# Patient Record
Sex: Female | Born: 1945
Health system: Southern US, Community
[De-identification: ages and names within clinical notes are randomized; demographics above are authoritative.]

## PROBLEM LIST (undated history)

## (undated) DIAGNOSIS — F419 Anxiety disorder, unspecified: Secondary | ICD-10-CM

## (undated) DIAGNOSIS — I639 Cerebral infarction, unspecified: Secondary | ICD-10-CM

## (undated) DIAGNOSIS — F329 Major depressive disorder, single episode, unspecified: Secondary | ICD-10-CM

## (undated) DIAGNOSIS — I10 Essential (primary) hypertension: Secondary | ICD-10-CM

## (undated) DIAGNOSIS — G43909 Migraine, unspecified, not intractable, without status migrainosus: Secondary | ICD-10-CM

## (undated) DIAGNOSIS — M1990 Unspecified osteoarthritis, unspecified site: Secondary | ICD-10-CM

## (undated) DIAGNOSIS — J449 Chronic obstructive pulmonary disease, unspecified: Secondary | ICD-10-CM

## (undated) DIAGNOSIS — F32A Depression, unspecified: Secondary | ICD-10-CM

## (undated) DIAGNOSIS — K219 Gastro-esophageal reflux disease without esophagitis: Secondary | ICD-10-CM

## (undated) HISTORY — PX: OTHER SURGICAL HISTORY: SHX169

## (undated) HISTORY — PX: APPENDECTOMY: SHX54

## (undated) HISTORY — PX: CHOLECYSTECTOMY: SHX55

## (undated) HISTORY — PX: CEREBRAL ANEURYSM REPAIR: SHX164

## (undated) HISTORY — DX: Anxiety disorder, unspecified: F41.9

## (undated) HISTORY — PX: SEPTOPLASTY: SUR1290

## (undated) HISTORY — DX: Depression, unspecified: F32.A

## (undated) HISTORY — DX: Major depressive disorder, single episode, unspecified: F32.9

## (undated) HISTORY — PX: TUBAL LIGATION: SHX77

## (undated) HISTORY — PX: EYE SURGERY: SHX253

## (undated) HISTORY — DX: Migraine, unspecified, not intractable, without status migrainosus: G43.909

## (undated) HISTORY — PX: HEMORROIDECTOMY: SUR656

---

## 1991-11-14 HISTORY — PX: BRAIN SURGERY: SHX531

## 1998-06-17 ENCOUNTER — Ambulatory Visit (HOSPITAL_COMMUNITY): Admission: RE | Admit: 1998-06-17 | Discharge: 1998-06-17 | Payer: Self-pay | Admitting: Unknown Physician Specialty

## 2001-03-06 ENCOUNTER — Emergency Department (HOSPITAL_COMMUNITY): Admission: EM | Admit: 2001-03-06 | Discharge: 2001-03-07 | Payer: Self-pay | Admitting: Emergency Medicine

## 2001-03-07 ENCOUNTER — Encounter: Payer: Self-pay | Admitting: Emergency Medicine

## 2001-04-15 ENCOUNTER — Ambulatory Visit (HOSPITAL_COMMUNITY): Admission: RE | Admit: 2001-04-15 | Discharge: 2001-04-15 | Payer: Self-pay | Admitting: Pulmonary Disease

## 2001-04-22 ENCOUNTER — Ambulatory Visit (HOSPITAL_COMMUNITY): Admission: RE | Admit: 2001-04-22 | Discharge: 2001-04-22 | Payer: Self-pay | Admitting: Pulmonary Disease

## 2001-08-28 ENCOUNTER — Emergency Department (HOSPITAL_COMMUNITY): Admission: EM | Admit: 2001-08-28 | Discharge: 2001-08-29 | Payer: Self-pay | Admitting: *Deleted

## 2001-08-28 ENCOUNTER — Encounter: Payer: Self-pay | Admitting: *Deleted

## 2001-12-31 ENCOUNTER — Emergency Department (HOSPITAL_COMMUNITY): Admission: EM | Admit: 2001-12-31 | Discharge: 2001-12-31 | Payer: Self-pay | Admitting: *Deleted

## 2001-12-31 ENCOUNTER — Encounter: Payer: Self-pay | Admitting: *Deleted

## 2002-01-31 ENCOUNTER — Ambulatory Visit (HOSPITAL_COMMUNITY): Admission: RE | Admit: 2002-01-31 | Discharge: 2002-01-31 | Payer: Self-pay | Admitting: Pulmonary Disease

## 2002-04-11 ENCOUNTER — Observation Stay (HOSPITAL_COMMUNITY): Admission: EM | Admit: 2002-04-11 | Discharge: 2002-04-11 | Payer: Self-pay | Admitting: Internal Medicine

## 2002-04-11 ENCOUNTER — Encounter: Payer: Self-pay | Admitting: Internal Medicine

## 2002-04-24 ENCOUNTER — Ambulatory Visit (HOSPITAL_COMMUNITY): Admission: RE | Admit: 2002-04-24 | Discharge: 2002-04-24 | Payer: Self-pay | Admitting: Pulmonary Disease

## 2002-11-10 ENCOUNTER — Emergency Department (HOSPITAL_COMMUNITY): Admission: EM | Admit: 2002-11-10 | Discharge: 2002-11-11 | Payer: Self-pay | Admitting: Emergency Medicine

## 2003-01-17 ENCOUNTER — Encounter: Payer: Self-pay | Admitting: Emergency Medicine

## 2003-01-17 ENCOUNTER — Emergency Department (HOSPITAL_COMMUNITY): Admission: EM | Admit: 2003-01-17 | Discharge: 2003-01-17 | Payer: Self-pay | Admitting: Emergency Medicine

## 2003-04-27 ENCOUNTER — Ambulatory Visit (HOSPITAL_COMMUNITY): Admission: RE | Admit: 2003-04-27 | Discharge: 2003-04-27 | Payer: Self-pay | Admitting: Pulmonary Disease

## 2005-12-20 ENCOUNTER — Ambulatory Visit: Payer: Self-pay | Admitting: Cardiology

## 2005-12-29 ENCOUNTER — Ambulatory Visit: Payer: Self-pay | Admitting: Internal Medicine

## 2005-12-29 ENCOUNTER — Inpatient Hospital Stay (HOSPITAL_BASED_OUTPATIENT_CLINIC_OR_DEPARTMENT_OTHER): Admission: RE | Admit: 2005-12-29 | Discharge: 2005-12-29 | Payer: Self-pay | Admitting: Internal Medicine

## 2006-01-12 ENCOUNTER — Ambulatory Visit: Payer: Self-pay | Admitting: Cardiology

## 2008-11-13 HISTORY — PX: OTHER SURGICAL HISTORY: SHX169

## 2009-11-08 ENCOUNTER — Inpatient Hospital Stay (HOSPITAL_COMMUNITY): Admission: EM | Admit: 2009-11-08 | Discharge: 2009-11-17 | Payer: Self-pay | Admitting: Neurological Surgery

## 2009-11-09 ENCOUNTER — Encounter: Payer: Self-pay | Admitting: Emergency Medicine

## 2009-11-10 ENCOUNTER — Encounter (INDEPENDENT_AMBULATORY_CARE_PROVIDER_SITE_OTHER): Payer: Self-pay | Admitting: Neurological Surgery

## 2009-11-12 ENCOUNTER — Encounter (INDEPENDENT_AMBULATORY_CARE_PROVIDER_SITE_OTHER): Payer: Self-pay | Admitting: Neurological Surgery

## 2009-11-15 ENCOUNTER — Encounter (INDEPENDENT_AMBULATORY_CARE_PROVIDER_SITE_OTHER): Payer: Self-pay | Admitting: Neurological Surgery

## 2009-11-15 ENCOUNTER — Ambulatory Visit: Payer: Self-pay | Admitting: Internal Medicine

## 2009-12-17 ENCOUNTER — Encounter: Payer: Self-pay | Admitting: Interventional Radiology

## 2010-01-03 DIAGNOSIS — C539 Malignant neoplasm of cervix uteri, unspecified: Secondary | ICD-10-CM | POA: Insufficient documentation

## 2010-01-03 DIAGNOSIS — J449 Chronic obstructive pulmonary disease, unspecified: Secondary | ICD-10-CM

## 2010-01-03 DIAGNOSIS — F3289 Other specified depressive episodes: Secondary | ICD-10-CM | POA: Insufficient documentation

## 2010-01-03 DIAGNOSIS — F329 Major depressive disorder, single episode, unspecified: Secondary | ICD-10-CM

## 2010-01-03 DIAGNOSIS — E785 Hyperlipidemia, unspecified: Secondary | ICD-10-CM

## 2010-01-03 DIAGNOSIS — I609 Nontraumatic subarachnoid hemorrhage, unspecified: Secondary | ICD-10-CM

## 2010-01-03 DIAGNOSIS — J4489 Other specified chronic obstructive pulmonary disease: Secondary | ICD-10-CM | POA: Insufficient documentation

## 2010-01-04 ENCOUNTER — Ambulatory Visit: Payer: Self-pay | Admitting: Pulmonary Disease

## 2010-01-04 DIAGNOSIS — F17200 Nicotine dependence, unspecified, uncomplicated: Secondary | ICD-10-CM | POA: Insufficient documentation

## 2010-01-04 DIAGNOSIS — J42 Unspecified chronic bronchitis: Secondary | ICD-10-CM | POA: Insufficient documentation

## 2010-12-15 NOTE — Assessment & Plan Note (Signed)
Summary: HFU AFTER PFT PER PETE///KP   Primary Provider/Referring Provider:  Dr. Olena Leatherwood in [place]  CC:  HFU.  No complaints today. Discuss PFT's..  History of Present Illness: 65 yo female for COPD evaluation.  She was seen by the critical care service during her hospital admission for [facility].  During that time concern was raised for her possibly having COPD.  As a result post-hospital pulmonary follow up was arranged.  She continues to smoke, but is not down to just a couple of packs per week.  She smoked upto 1 pack per day, and started at age 37.  She does not have any trouble with her breathing.  She gets a cough at night, and brings up gray colored sputum.  She will get some wheeze, but this clears after she coughs.  Her sinuses are okay.  She denies fever, rashes, swelling, or hemoptysis.  There is no history of asthma, PNA, or TB.  She has never been told that she had COPD before.  There is no occupational exposures.  She is currently on disability due to her anxiety and panic attacks, and has been so since 1993.  PFTs today show mild obstruction, no bronchodilator response, normal lung volumes, and mild diffusion defect.  This is consistent with mild COPD and emphysema.   Preventive Screening-Counseling & Management  Alcohol-Tobacco     Smoking Status: current     Packs/Day: 0.5     Year Started: age 15  Current Medications (verified): 1)  Vitamin E 400 Unit Caps (Vitamin E) .Marland Kitchen.. 1 By Mouth Daily 2)  Fish Oil 1000 Mg Caps (Omega-3 Fatty Acids) .Marland Kitchen.. 1 By Mouth Daily 3)  Zoloft 50 Mg Tabs (Sertraline Hcl) .... 2 By Mouth At Bedtime 4)  Xanax 0.5 Mg Tabs (Alprazolam) .Marland Kitchen.. 1 By Mouth Two Times A Day 5)  Mucinex Dm 30-600 Mg Xr12h-Tab (Dextromethorphan-Guaifenesin) .Marland Kitchen.. 1 By Mouth Two Times A Day 6)  Clorazepate Dipotassium 15 Mg Tabs (Clorazepate Dipotassium) .Marland Kitchen.. 1 By Mouth At Bedtime  Allergies: 1)  ! Morphine 2)  ! Codeine 3)  ! Demerol 4)  ! Sulfa  Past History:  Past  Medical History: Right MCA aneurysm s/p clipping Subarachnoid hemorrhage Dec. 2010 Anxiety, Panic attacks Hyperlipidemia COPD      - PFT 01/04/10 FEV1 1.63(66%), FVC 2.42(72%), FEV1% 68, TLC 4.93(90%), DLCO 78%, no BD  Past Surgical History: Craniotomy for aneurysm Appendectomy Cholecystectomy Sinus surgery Left Lumpectomy for benign lesion Eye surgery for dry eyes Vocal cord polyp, benign Normal cardiac catheterization Feb 2007  CXR  Procedure date:  11/15/2009  Findings:      Comparison: Chest 11/14/2009.    Findings: Right PICC remains in place.  There is new mild   interstitial edema.  Cardiomegaly is noted.  No effusion or   consolidative process.    IMPRESSION:   New mild interstitial pulmonary edema.   Family History: Family History Diabetes---PGM PGF---cancer Arthritis---mother's side of the family CHF---MGM CAD---Father  Social History: Patient is a current smoker, 1/2 pack per day.   Social ETOH use Disabled due to anxietyPacks/Day:  0.5  Vital Signs:  Patient profile:   65 year old female Height:      67 inches (170.18 cm) Weight:      170 pounds (77.27 kg) BMI:     26.72 O2 Sat:      94 % on Room air Temp:     98.0 degrees F (36.67 degrees C) oral Pulse rate:   80 / minute  BP sitting:   132 / 88  (left arm) Cuff size:   regular  Vitals Entered By: Michel Bickers CMA (January 04, 2010 12:04 PM)  O2 Sat at Rest %:  94 O2 Flow:  Room air CC: HFU.  No complaints today. Discuss PFT's.   Physical Exam  General:  obese.   Nose:  no deformity, discharge, inflammation, or lesions Mouth:  no deformity or lesions Neck:  no JVD.   Lungs:  clear bilaterally to auscultation and percussion Heart:  regular rate and rhythm, S1, S2 without murmurs, rubs, gallops, or clicks Abdomen:  bowel sounds positive; abdomen soft and non-tender without masses, or organomegaly Extremities:  no clubbing, cyanosis, edema, or deformity noted Cervical Nodes:  no  significant adenopathy Psych:  anxious.     Impression & Recommendations:  Problem # 1:  C O P D (ICD-496) She has borderline obstruction on PFT today.  I emphasized the need for her to quit smoking, and offered assistance with this.  She otherwise does not feel like she is having too much trouble with her breathing at present.  I explained that if she continues to smoke her lung function will get worse, and she will likely develop more symptoms then.  She would prefer to continue with clinical observation, and try inhaler therapy if her symptoms get worse.  She would also like to follow up with her primary care physician, and return to pulmonary only if her symptoms get worse.  Medications Added to Medication List This Visit: 1)  Vitamin E 400 Unit Caps (Vitamin e) .Marland Kitchen.. 1 by mouth daily 2)  Fish Oil 1000 Mg Caps (Omega-3 fatty acids) .Marland Kitchen.. 1 by mouth daily 3)  Zoloft 50 Mg Tabs (Sertraline hcl) .... 2 by mouth at bedtime 4)  Xanax 0.5 Mg Tabs (Alprazolam) .Marland Kitchen.. 1 by mouth two times a day 5)  Mucinex Dm 30-600 Mg Xr12h-tab (Dextromethorphan-guaifenesin) .Marland Kitchen.. 1 by mouth two times a day 6)  Clorazepate Dipotassium 15 Mg Tabs (Clorazepate dipotassium) .Marland Kitchen.. 1 by mouth at bedtime  Complete Medication List: 1)  Vitamin E 400 Unit Caps (Vitamin e) .Marland Kitchen.. 1 by mouth daily 2)  Fish Oil 1000 Mg Caps (Omega-3 fatty acids) .Marland Kitchen.. 1 by mouth daily 3)  Zoloft 50 Mg Tabs (Sertraline hcl) .... 2 by mouth at bedtime 4)  Xanax 0.5 Mg Tabs (Alprazolam) .Marland Kitchen.. 1 by mouth two times a day 5)  Mucinex Dm 30-600 Mg Xr12h-tab (Dextromethorphan-guaifenesin) .Marland Kitchen.. 1 by mouth two times a day 6)  Clorazepate Dipotassium 15 Mg Tabs (Clorazepate dipotassium) .Marland Kitchen.. 1 by mouth at bedtime  Other Orders: Est. Patient Level III (84132)  Patient Instructions: 1)  Follow up as needed    Immunization History:  Influenza Immunization History:    Influenza:  historical (07/23/2009)  Pneumovax Immunization History:    Pneumovax:   historical (07/15/2007)

## 2010-12-15 NOTE — Miscellaneous (Signed)
Summary: Orders Update pft charges  Clinical Lists Changes  Orders: Added new Service order of Carbon Monoxide diffusing w/capacity (94720) - Signed Added new Service order of Lung Volumes (94240) - Signed Added new Service order of Spirometry (Pre & Post) (94060) - Signed 

## 2011-01-29 LAB — BASIC METABOLIC PANEL
BUN: 13 mg/dL (ref 6–23)
CO2: 19 mEq/L (ref 19–32)
CO2: 19 mEq/L (ref 19–32)
CO2: 23 mEq/L (ref 19–32)
Calcium: 8.3 mg/dL — ABNORMAL LOW (ref 8.4–10.5)
Calcium: 8.9 mg/dL (ref 8.4–10.5)
Chloride: 103 mEq/L (ref 96–112)
Chloride: 112 mEq/L (ref 96–112)
Chloride: 113 mEq/L — ABNORMAL HIGH (ref 96–112)
Chloride: 113 mEq/L — ABNORMAL HIGH (ref 96–112)
Creatinine, Ser: 0.6 mg/dL (ref 0.4–1.2)
Creatinine, Ser: 0.67 mg/dL (ref 0.4–1.2)
Creatinine, Ser: 0.68 mg/dL (ref 0.4–1.2)
Creatinine, Ser: 0.68 mg/dL (ref 0.4–1.2)
GFR calc Af Amer: >60 mL/min (ref >60)
GFR calc Af Amer: >60 mL/min (ref >60)
GFR calc Af Amer: >60 mL/min (ref >60)
GFR calc non Af Amer: >60 mL/min (ref >60)
GFR calc non Af Amer: >60 mL/min (ref >60)
Glucose, Bld: 113 mg/dL — ABNORMAL HIGH (ref 70–99)
Glucose, Bld: 139 mg/dL — ABNORMAL HIGH (ref 70–99)
Potassium: 3.3 mEq/L — ABNORMAL LOW (ref 3.5–5.1)
Potassium: 4.5 mEq/L (ref 3.5–5.1)
Sodium: 137 mEq/L (ref 135–145)
Sodium: 139 mEq/L (ref 135–145)

## 2011-01-29 LAB — CBC
HCT: 34.4 % — ABNORMAL LOW (ref 36.0–46.0)
HCT: 40.6 % (ref 36.0–46.0)
Hemoglobin: 12.2 g/dL (ref 12.0–15.0)
MCHC: 34.9 g/dL (ref 30.0–36.0)
MCHC: 35.3 g/dL (ref 30.0–36.0)
MCHC: 35.5 g/dL (ref 30.0–36.0)
MCHC: 35.5 g/dL (ref 30.0–36.0)
MCV: 97 fL (ref 78.0–100.0)
MCV: 97 fL (ref 78.0–100.0)
MCV: 97.1 fL (ref 78.0–100.0)
MCV: 97.1 fL (ref 78.0–100.0)
MCV: 97.2 fL (ref 78.0–100.0)
MCV: 97.6 fL (ref 78.0–100.0)
Platelets: 181 10*3/uL (ref 150–400)
Platelets: 219 10*3/uL (ref 150–400)
RBC: 3.55 MIL/uL — ABNORMAL LOW (ref 3.87–5.11)
RBC: 3.58 MIL/uL — ABNORMAL LOW (ref 3.87–5.11)
RBC: 3.6 MIL/uL — ABNORMAL LOW (ref 3.87–5.11)
RBC: 4.18 MIL/uL (ref 3.87–5.11)
RDW: 12.5 % (ref 11.5–15.5)
RDW: 12.5 % (ref 11.5–15.5)
RDW: 12.7 % (ref 11.5–15.5)
RDW: 13 % (ref 11.5–15.5)
WBC: 11 10*3/uL — ABNORMAL HIGH (ref 4.0–10.5)
WBC: 11.6 10*3/uL — ABNORMAL HIGH (ref 4.0–10.5)
WBC: 12.7 10*3/uL — ABNORMAL HIGH (ref 4.0–10.5)
WBC: 13.4 10*3/uL — ABNORMAL HIGH (ref 4.0–10.5)

## 2011-01-29 LAB — MAGNESIUM
Magnesium: 2.2 mg/dL (ref 1.5–2.5)
Magnesium: 2.3 mg/dL (ref 1.5–2.5)

## 2011-01-29 LAB — PHOSPHORUS: Phosphorus: 1.8 mg/dL — ABNORMAL LOW (ref 2.3–4.6)

## 2011-02-13 LAB — CBC
Hemoglobin: 11.6 g/dL — ABNORMAL LOW (ref 12.0–15.0)
Hemoglobin: 12.7 g/dL (ref 12.0–15.0)
MCHC: 35.1 g/dL (ref 30.0–36.0)
MCHC: 35.2 g/dL (ref 30.0–36.0)
MCHC: 35.6 g/dL (ref 30.0–36.0)
MCHC: 34.2 g/dL (ref 30.0–36.0)
MCV: 97 fL (ref 78.0–100.0)
Platelets: 160 10*3/uL (ref 150–400)
Platelets: 183 10*3/uL (ref 150–400)
Platelets: 186 10*3/uL (ref 150–400)
RBC: 3.73 MIL/uL — ABNORMAL LOW (ref 3.87–5.11)
RBC: 3.9 MIL/uL (ref 3.87–5.11)
RBC: 4.56 MIL/uL (ref 3.87–5.11)
RDW: 12.9 % (ref 11.5–15.5)
RDW: 13 % (ref 11.5–15.5)
WBC: 11.3 10*3/uL — ABNORMAL HIGH (ref 4.0–10.5)
WBC: 12.5 10*3/uL — ABNORMAL HIGH (ref 4.0–10.5)

## 2011-02-13 LAB — BLOOD GAS, ARTERIAL
Acid-base deficit: 0.8 mmol/L (ref 0.0–2.0)
Bicarbonate: 23.2 mEq/L (ref 20.0–24.0)
FIO2: 0.4 %
Mode: POSITIVE
O2 Saturation: 96.7 %
O2 Saturation: 99.4 %
PEEP: 5 cmH2O
PEEP: 5 cmH2O
Patient temperature: 98.6
Patient temperature: 98.6
Pressure support: 5 cmH2O
RATE: 14 resp/min
pH, Arterial: 7.331 — ABNORMAL LOW (ref 7.350–7.400)
pO2, Arterial: 86.8 mmHg (ref 80.0–100.0)

## 2011-02-13 LAB — COMPREHENSIVE METABOLIC PANEL
ALT: 34 U/L (ref 0–35)
ALT: 21 U/L (ref 0–35)
AST: 31 U/L (ref 0–37)
Albumin: 3.5 g/dL (ref 3.5–5.2)
BUN: 10 mg/dL (ref 6–23)
CO2: 23 mEq/L (ref 19–32)
Calcium: 7.9 mg/dL — ABNORMAL LOW (ref 8.4–10.5)
Calcium: 9.1 mg/dL (ref 8.4–10.5)
Chloride: 104 mEq/L (ref 96–112)
GFR calc Af Amer: >60 mL/min (ref >60)
GFR calc non Af Amer: >60 mL/min (ref >60)
Glucose, Bld: 188 mg/dL — ABNORMAL HIGH (ref 70–99)
Sodium: 146 mEq/L — ABNORMAL HIGH (ref 135–145)
Sodium: 140 mEq/L (ref 135–145)
Total Protein: 6.3 g/dL (ref 6.0–8.3)

## 2011-02-13 LAB — BASIC METABOLIC PANEL
BUN: 5 mg/dL — ABNORMAL LOW (ref 6–23)
BUN: 6 mg/dL (ref 6–23)
BUN: 8 mg/dL (ref 6–23)
CO2: 23 mEq/L (ref 19–32)
CO2: 25 mEq/L (ref 19–32)
CO2: 26 mEq/L (ref 19–32)
Calcium: 7.4 mg/dL — ABNORMAL LOW (ref 8.4–10.5)
Calcium: 8.3 mg/dL — ABNORMAL LOW (ref 8.4–10.5)
Chloride: 107 mEq/L (ref 96–112)
Chloride: 111 mEq/L (ref 96–112)
Creatinine, Ser: 0.67 mg/dL (ref 0.4–1.2)
Creatinine, Ser: 0.67 mg/dL (ref 0.4–1.2)
Creatinine, Ser: 0.75 mg/dL (ref 0.4–1.2)
GFR calc Af Amer: >60 mL/min (ref >60)
GFR calc Af Amer: >60 mL/min (ref >60)
GFR calc non Af Amer: >60 mL/min (ref >60)
Glucose, Bld: 119 mg/dL — ABNORMAL HIGH (ref 70–99)
Glucose, Bld: 146 mg/dL — ABNORMAL HIGH (ref 70–99)
Sodium: 140 mEq/L (ref 135–145)
Sodium: 142 mEq/L (ref 135–145)

## 2011-02-13 LAB — POCT CARDIAC MARKERS
CKMB, poc: 2.4 ng/mL (ref 1.0–8.0)
Myoglobin, poc: 131 ng/mL (ref 12–200)
Myoglobin, poc: 96.9 ng/mL (ref 12–200)
Troponin i, poc: 0.12 ng/mL — ABNORMAL HIGH (ref 0.00–0.09)

## 2011-02-13 LAB — DIFFERENTIAL
Eosinophils Absolute: 0 10*3/uL (ref 0.0–0.7)
Eosinophils Relative: 0 % (ref 0–5)
Lymphs Abs: 2.3 10*3/uL (ref 0.7–4.0)

## 2011-02-13 LAB — LIPASE, BLOOD: Lipase: 23 U/L (ref 11–59)

## 2011-03-31 NOTE — Cardiovascular Report (Signed)
NAME:  Diane Hutchinson, Diane Hutchinson NO.:  [id number]   MEDICAL RECORD NO.:  [id number]          PATIENT TYPE:  OIB   LOCATION:  1966                         FACILITY:  [facility]   PHYSICIAN:  Arvilla Meres, M.D. LHCDATE OF BIRTH:  12/18/1945   DATE OF PROCEDURE:  12/29/2005  DATE OF DISCHARGE:                              CARDIAC CATHETERIZATION   PRIMARY CARE PHYSICIAN:  Dr. Gae Gallop, Jonita Albee   CARDIOLOGIST:  Learta Codding, M.D. [facility]   PATIENT IDENTIFICATION:  Diane Hutchinson is a very pleasant 65 year old  woman with multiple cardiac risk factors including ongoing tobacco use who  has been experiencing progressive chest pain.  She is thus referred for  diagnostic cardiac catheterization in the outpatient [facility] laboratory.   PROCEDURES PERFORMED:  1.  Selective coronary angiography.  2.  Left heart catheterization.  3.  Left ventriculogram.  4.  Abdominal aortogram.   DESCRIPTION OF PROCEDURE:  The risks and benefits of the procedure explained  to Diane Hutchinson and consent was signed and placed on the chart. A 4  French arterial sheath was placed in the right femoral artery using a  modified Seldinger technique.  Initially there was some trouble getting the  wire up through the abdominal aorta, however, with the support of a right  coronary catheter it was easily passed. A JL4 was used to image the left  coronary system, a 3D RC catheters was used to image the right coronary, an  angled pigtail was used for the left ventriculogram.  All catheter exchanges  made over wire.  There are no apparent complications. Central aortic  pressure is 138/67 with a mean of 95.  LV pressure is 118/0 with an LVEDP of  3.   Left main was normal.   LAD was a moderate size vessel which tapered significantly in the distal  portion.  It gave off of one branching diagonal.  There is no significant  CAD.   Left circumflex was a mild to large sized system made up primarily of a  large branching  OM1.  There is no angiographic CAD.   Right coronary artery was a moderate sized dominant vessel giving off PDA  and posterior lateral.  There is no significant CAD.   Left ventriculogram done the RAO approach showed an EF of 70% with vigorous  wall motion and no wall motion abnormalities. There is no mitral  regurgitation.   Abdominal aortogram showed patent renal arteries bilaterally.  There was  moderate infrarenal abdominal aortic iliac disease.  There appeared to be a  50-60% focal lesion in the proximal right common iliac.   ASSESSMENT:  1.  Normal coronary arteries.  2.  Normal LV function.  3.  Mild to moderate peripheral arterial disease as described above.   PLAN:  Will be for medical therapy.  She will need aggressive risk factor  management including smoking cessation.      Arvilla Meres, M.D. [facility]  Electronically Signed     DB/MEDQ  D:  12/29/2005  T:  12/29/2005  Job:  [id number]   cc:   Dr. Pearletha Furl, M.D. [facility]  [address]  [address]  [place]  [place] [address]

## 2011-03-31 NOTE — H&P (Signed)
[facility]  Patient:    MAGIE, CIAMPA Visit Number: [id number] MRN: [id number]          Service Type: MED Location: 3A Y782 01 Attending Physician:  Fredirick Maudlin Dictated by:   Renne Musca, M.D. Admit Date:  04/10/2002 Discharge Date: 04/11/2002                           History and Physical  DATE OF BIRTH:  May 04, 1946  HISTORY OF PRESENT ILLNESS:  The patient is a 65 year old Caucasian female followed by Dr. Shaune Pollack with a past medical history remarkable for anxiety disorder, on multiple medications.  She presented to the emergency room initially complaining of right groin pain, but the patient states she actually came because she had a headache.  In any event, the right groin pain has been present for approximately two days, where she noted it as she had gotten up, and it persisted and has become worse.  She describes it as severe, 8/10, pretty much localized, but sharp and exquisitely tender to any palpation.  She states she has never had pain like this before.  She has had no trauma.  Her review of systems is pertinent for no GU complaints.  She does have chronic constipation, moving her bowels at most once every seven days.  The patient actually had a bowel movement in the emergency room, and the pain was somewhat improved.  Her evaluation in the emergency room was unremarkable as far as the pain was concerned.  She had a CT of the abdomen and pelvis which revealed some left-sided diverticula but no evidence of diverticulitis.  No other significant findings were noted.  The patient received Toradol in the emergency room and, apparently, began to complain of severe epigastric pain and pain throughout her stomach and abdomen and required morphine which, again, made her pain significantly worse, as well as Valium and 2 mg of Ativan to enable her to have CT scan.  She also received Thorazine 50 mg IM and, apparently, none of  these had immediate effect on the patient, and she felt that they made her abdominal pain worse, although at this time she says that the pain is a 2/10.  She does not want to participate with any aggressive examination so as not to make it worse.  Because of these findings and the unclear etiology and severity of the pain, she is admitted to the hospital for further evaluation.  The patient is accompanied by her significant other, who provides a fair amount of the history and tends to correct any of the details that she has been able to provide.  PAST MEDICAL HISTORY: 1. Anxiety disorder with ______ agoraphobia, followed by Dr. Betti Cruz.  She has    had no psychotherapy. 2. Status post right MCA aneurysm repair which was apparently discovered    incidentally after a fall in 1993. 3. Status post cholecystectomy. 4. Status post appendectomy. 5. Status post right breast surgery for benign disease. 6. History of cervical conization. 7. Hyperlipidemia, though on no medications. 8. Ulcers, again, not on any medications.  SOCIAL HISTORY:  The patient is disabled secondary to her brain problems and anxiety.  She has one daughter who is alive and well, apparently supportive, lives nearby.  She smokes one pack of cigarettes per day.  No alcohol use. She was previously employed in [employer].  FAMILY HISTORY:  Father died in his 45s of  an MI.  REVIEW OF SYSTEMS:  Chronic headaches.  Anhedonia.  Poor sleep.  Panic attacks.  She is fairly sedentary.  No GU complaints.  GI as noted above.  She "controls her medical problems through diet."  MEDICATIONS: 1. trazodone 1-200 mg q.h.s. p.r.n. for sleep. 2. Xanax 1 mg t.i.d. and 2 mg at bedtime. 3. Zoloft 50 mg b.i.d. 4. Darvocet-N 100 q.4h. p.r.n. pain.  ALLERGIES:  CODEINE, DEMEROL, and SULFA.  PHYSICAL EXAMINATION:  GENERAL:  Sleepy though appropriate white female who can answer some questions.  She appears to be in no distress.  When  she is distracted she appears quite comfortable.  VITAL SIGNS:  Blood pressure 110/60, pulse 74 and regular, respirations unlabored supine.  NECK:  Supple.  No JVD, adenopathy, thyromegaly.  LUNGS:  Clear to auscultation, though diminished.  No rales or rhonchi.  HEART:  Regular rate and rhythm.  No murmur, gallop, or rub.  ABDOMEN:  Protuberant and tympanitic though soft, with very active bowel sounds.  Some mild suprapubic tenderness.  When I examined the inguinal area towards the midline, particularly on the right, she appears to be quite uncomfortable and writhes in pain, although there are no skin findings, no induration.  I cannot detect evidence of a hernia sac.  The area is soft and normal on exam.  EXTREMITIES:  Without clubbing, cyanosis, or edema.  Dorsalis pedis pulses are intact bilaterally.  SKIN:  Without rash, lesion, breakdown, or evidence of ecchymoses or trauma.  BREASTS:  Deferred.  ASSESSMENT AND PLAN: 1. Right inguinal pain, unclear etiology.  The patient has had a significant    amount of medication and still is not comfortable.  Certainly, a surgical    consultation will be reasonable should it persist.  Follow up on the final    report of CT scan of the abdomen and pelvis.  Darvocet p.r.n.  Avoid    nonsteroidals given her GI history. 2. History of peptic ulcer disease:  Will get a dose of Protonix tonight given    her complaints, and monitor.  She is requesting milk, which is what she    uses at home for her symptoms; certainly, no contraindication. 3. Chronic constipation:  The patient needs a bowel regimen.  Also, a    screening colonoscopy was discussed with the patient. 4. Mild hypokalemia:  Not on diuretics.  She does use laxatives including aloe    vera, though I do not know if that is playing a role. 5. Hyperglycemia and random glucose of 153:  Check hemoglobin A1C.  She does    have a family history. 6. History of hyperlipidemia:  This can be  followed up as an outpatient.  It    is important, given her family history.  7. Anxiety disorder:  She has some manipulative behaviors as I watched the    relationship between her significant other and herself.  Psychotherapy has    never been utilized in this patient.  She may benefit from sort of support. 8. Tobacco abuse:  The patient is interested in cessation. Dictated by:   Renne Musca, M.D. Attending Physician:  Fredirick Maudlin DD:  04/11/02 TD:  04/11/02 Job: [id number]

## 2011-03-31 NOTE — Consult Note (Signed)
[facility]  Patient:    Diane Hutchinson, Diane Hutchinson Visit Number: [id number] MRN: [id number]          Service Type: MED Location: 3A A318 01 Attending Physician:  Fredirick Maudlin Dictated by:   Franky Macho, M.D. Proc. Date: 04/11/02 Admit Date:  04/10/2002 Discharge Date: 04/11/2002   CC:         [name], M.D.  [name], M.D.   Consultation Report  REFERRING PHYSICIANS:  Drs. Fagan/Hawkins  HISTORY OF PRESENT ILLNESS:  The patient is a 65 year old white female who presented to the emergency room yesterday evening with worsening right groin pain.  She does know specifically how it started, though the pain seemed to worsen.  She states she is tender to touch in the right groin region.  No masses have been noted.  No nausea or vomiting have been noted.  The patient was seen in the emergency room and was admitted to the hospital due to uncontrollable pain.  A CT scan of the abdomen and pelvis was performed which revealed only rectosigmoid diverticulosis without evidence of diverticulitis.  No hernias were seen.  She states that since she has been in the hospital, her pain is somewhat better, though not fully resolved.  The pain seems to radiate a tingling sensation down her right leg.  It does not radiate anywhere else.  PAST MEDICAL HISTORY:  Panic attacks and anxiety disorder.  PAST SURGICAL HISTORY:  Noncontributory.  CURRENT MEDICATIONS:  Trazodone, Xanax, Zoloft, Darvocet.  ALLERGIES:  No known drug allergies.  PHYSICAL EXAMINATION:  GENERAL:  The patient is a well-developed, well-nourished white female in no acute distress.  VITAL SIGNS:  She is afebrile and vital signs are stable.  ABDOMEN:  Soft, nontender, and nondistended.  No hepatosplenomegaly, masses, or hernias are identified.  She is tender over the right pubic tubercle.  No hernia could be felt.  No femoral hernia could be felt.  IMPRESSION:  Right groin pain of unknown  etiology, question musculoskeletal strain.  Doubt right inguinal hernia at this time, though one could develop in the future.  PLAN:  The patient will be discharged home on Darvocet and ibuprofen for pain.  A repeat right groin examination will be performed in two weeks in my office.  This was all explained to the patient, who does desire to be discharged. This was discussed with Dr. Ouida Sills. Dictated by:   Franky Macho, M.D. Attending Physician:  Fredirick Maudlin DD:  04/11/02 TD:  04/12/02 Job: [id number]

## 2011-12-19 DIAGNOSIS — F410 Panic disorder [episodic paroxysmal anxiety] without agoraphobia: Secondary | ICD-10-CM | POA: Diagnosis not present

## 2011-12-22 DIAGNOSIS — M549 Dorsalgia, unspecified: Secondary | ICD-10-CM | POA: Diagnosis not present

## 2011-12-22 DIAGNOSIS — E782 Mixed hyperlipidemia: Secondary | ICD-10-CM | POA: Diagnosis not present

## 2011-12-22 DIAGNOSIS — J449 Chronic obstructive pulmonary disease, unspecified: Secondary | ICD-10-CM | POA: Diagnosis not present

## 2011-12-22 DIAGNOSIS — J209 Acute bronchitis, unspecified: Secondary | ICD-10-CM | POA: Diagnosis not present

## 2011-12-22 DIAGNOSIS — K2900 Acute gastritis without bleeding: Secondary | ICD-10-CM | POA: Diagnosis not present

## 2012-03-21 DIAGNOSIS — I10 Essential (primary) hypertension: Secondary | ICD-10-CM | POA: Diagnosis not present

## 2012-03-29 DIAGNOSIS — IMO0002 Reserved for concepts with insufficient information to code with codable children: Secondary | ICD-10-CM | POA: Diagnosis not present

## 2012-06-18 DIAGNOSIS — F410 Panic disorder [episodic paroxysmal anxiety] without agoraphobia: Secondary | ICD-10-CM | POA: Diagnosis not present

## 2012-06-21 DIAGNOSIS — I10 Essential (primary) hypertension: Secondary | ICD-10-CM | POA: Diagnosis not present

## 2012-06-21 DIAGNOSIS — R5381 Other malaise: Secondary | ICD-10-CM | POA: Diagnosis not present

## 2012-06-21 DIAGNOSIS — E569 Vitamin deficiency, unspecified: Secondary | ICD-10-CM | POA: Diagnosis not present

## 2012-06-21 DIAGNOSIS — E782 Mixed hyperlipidemia: Secondary | ICD-10-CM | POA: Diagnosis not present

## 2012-07-30 DIAGNOSIS — L02818 Cutaneous abscess of other sites: Secondary | ICD-10-CM | POA: Diagnosis not present

## 2012-08-08 DIAGNOSIS — Z23 Encounter for immunization: Secondary | ICD-10-CM | POA: Diagnosis not present

## 2012-09-19 DIAGNOSIS — Z1231 Encounter for screening mammogram for malignant neoplasm of breast: Secondary | ICD-10-CM | POA: Diagnosis not present

## 2012-10-29 DIAGNOSIS — I10 Essential (primary) hypertension: Secondary | ICD-10-CM | POA: Diagnosis not present

## 2012-12-17 DIAGNOSIS — F410 Panic disorder [episodic paroxysmal anxiety] without agoraphobia: Secondary | ICD-10-CM | POA: Diagnosis not present

## 2013-01-04 DIAGNOSIS — F411 Generalized anxiety disorder: Secondary | ICD-10-CM | POA: Diagnosis not present

## 2013-01-04 DIAGNOSIS — Z7982 Long term (current) use of aspirin: Secondary | ICD-10-CM | POA: Diagnosis not present

## 2013-01-04 DIAGNOSIS — R42 Dizziness and giddiness: Secondary | ICD-10-CM | POA: Diagnosis not present

## 2013-01-04 DIAGNOSIS — S298XXA Other specified injuries of thorax, initial encounter: Secondary | ICD-10-CM | POA: Diagnosis not present

## 2013-01-04 DIAGNOSIS — F17200 Nicotine dependence, unspecified, uncomplicated: Secondary | ICD-10-CM | POA: Diagnosis not present

## 2013-01-04 DIAGNOSIS — S20219A Contusion of unspecified front wall of thorax, initial encounter: Secondary | ICD-10-CM | POA: Diagnosis not present

## 2013-01-04 DIAGNOSIS — Z79899 Other long term (current) drug therapy: Secondary | ICD-10-CM | POA: Diagnosis not present

## 2013-01-04 DIAGNOSIS — Z8673 Personal history of transient ischemic attack (TIA), and cerebral infarction without residual deficits: Secondary | ICD-10-CM | POA: Diagnosis not present

## 2013-01-04 DIAGNOSIS — R079 Chest pain, unspecified: Secondary | ICD-10-CM | POA: Diagnosis not present

## 2013-01-07 DIAGNOSIS — G459 Transient cerebral ischemic attack, unspecified: Secondary | ICD-10-CM | POA: Diagnosis not present

## 2013-01-14 DIAGNOSIS — G9389 Other specified disorders of brain: Secondary | ICD-10-CM | POA: Diagnosis not present

## 2013-01-14 DIAGNOSIS — F29 Unspecified psychosis not due to a substance or known physiological condition: Secondary | ICD-10-CM | POA: Diagnosis not present

## 2013-04-09 DIAGNOSIS — I10 Essential (primary) hypertension: Secondary | ICD-10-CM | POA: Diagnosis not present

## 2013-04-09 DIAGNOSIS — E782 Mixed hyperlipidemia: Secondary | ICD-10-CM | POA: Diagnosis not present

## 2013-06-17 DIAGNOSIS — F410 Panic disorder [episodic paroxysmal anxiety] without agoraphobia: Secondary | ICD-10-CM | POA: Diagnosis not present

## 2013-07-11 DIAGNOSIS — I10 Essential (primary) hypertension: Secondary | ICD-10-CM | POA: Diagnosis not present

## 2013-09-14 DIAGNOSIS — R51 Headache: Secondary | ICD-10-CM | POA: Diagnosis not present

## 2013-09-14 DIAGNOSIS — F319 Bipolar disorder, unspecified: Secondary | ICD-10-CM | POA: Diagnosis not present

## 2013-09-14 DIAGNOSIS — G9389 Other specified disorders of brain: Secondary | ICD-10-CM | POA: Diagnosis not present

## 2013-09-14 DIAGNOSIS — Z79899 Other long term (current) drug therapy: Secondary | ICD-10-CM | POA: Diagnosis not present

## 2013-09-14 DIAGNOSIS — Z7982 Long term (current) use of aspirin: Secondary | ICD-10-CM | POA: Diagnosis not present

## 2013-09-14 DIAGNOSIS — Z859 Personal history of malignant neoplasm, unspecified: Secondary | ICD-10-CM | POA: Diagnosis not present

## 2013-09-14 DIAGNOSIS — F17200 Nicotine dependence, unspecified, uncomplicated: Secondary | ICD-10-CM | POA: Diagnosis not present

## 2013-09-14 DIAGNOSIS — F411 Generalized anxiety disorder: Secondary | ICD-10-CM | POA: Diagnosis not present

## 2013-09-14 DIAGNOSIS — E7800 Pure hypercholesterolemia, unspecified: Secondary | ICD-10-CM | POA: Diagnosis not present

## 2013-09-14 DIAGNOSIS — Z8673 Personal history of transient ischemic attack (TIA), and cerebral infarction without residual deficits: Secondary | ICD-10-CM | POA: Diagnosis not present

## 2013-09-22 DIAGNOSIS — Z23 Encounter for immunization: Secondary | ICD-10-CM | POA: Diagnosis not present

## 2013-10-01 DIAGNOSIS — Z1231 Encounter for screening mammogram for malignant neoplasm of breast: Secondary | ICD-10-CM | POA: Diagnosis not present

## 2013-10-01 DIAGNOSIS — R922 Inconclusive mammogram: Secondary | ICD-10-CM | POA: Diagnosis not present

## 2013-10-07 DIAGNOSIS — I10 Essential (primary) hypertension: Secondary | ICD-10-CM | POA: Diagnosis not present

## 2013-10-07 DIAGNOSIS — N63 Unspecified lump in unspecified breast: Secondary | ICD-10-CM | POA: Diagnosis not present

## 2013-10-07 DIAGNOSIS — E782 Mixed hyperlipidemia: Secondary | ICD-10-CM | POA: Diagnosis not present

## 2013-12-16 DIAGNOSIS — F410 Panic disorder [episodic paroxysmal anxiety] without agoraphobia: Secondary | ICD-10-CM | POA: Diagnosis not present

## 2014-01-29 DIAGNOSIS — I779 Disorder of arteries and arterioles, unspecified: Secondary | ICD-10-CM | POA: Diagnosis not present

## 2014-01-29 DIAGNOSIS — I10 Essential (primary) hypertension: Secondary | ICD-10-CM | POA: Diagnosis not present

## 2014-01-29 DIAGNOSIS — Z0000 Encounter for general adult medical examination without abnormal findings: Secondary | ICD-10-CM | POA: Diagnosis not present

## 2014-01-30 DIAGNOSIS — Q279 Congenital malformation of peripheral vascular system, unspecified: Secondary | ICD-10-CM | POA: Diagnosis not present

## 2014-01-30 DIAGNOSIS — R51 Headache: Secondary | ICD-10-CM | POA: Diagnosis not present

## 2014-02-03 DIAGNOSIS — K5520 Angiodysplasia of colon without hemorrhage: Secondary | ICD-10-CM | POA: Diagnosis not present

## 2014-02-03 DIAGNOSIS — I671 Cerebral aneurysm, nonruptured: Secondary | ICD-10-CM | POA: Diagnosis not present

## 2014-02-03 DIAGNOSIS — R51 Headache: Secondary | ICD-10-CM | POA: Diagnosis not present

## 2014-05-01 DIAGNOSIS — I10 Essential (primary) hypertension: Secondary | ICD-10-CM | POA: Diagnosis not present

## 2014-06-16 DIAGNOSIS — F410 Panic disorder [episodic paroxysmal anxiety] without agoraphobia: Secondary | ICD-10-CM | POA: Diagnosis not present

## 2014-07-31 DIAGNOSIS — I10 Essential (primary) hypertension: Secondary | ICD-10-CM | POA: Diagnosis not present

## 2014-07-31 DIAGNOSIS — E782 Mixed hyperlipidemia: Secondary | ICD-10-CM | POA: Diagnosis not present

## 2014-09-07 DIAGNOSIS — Z23 Encounter for immunization: Secondary | ICD-10-CM | POA: Diagnosis not present

## 2014-10-21 DIAGNOSIS — N63 Unspecified lump in breast: Secondary | ICD-10-CM | POA: Diagnosis not present

## 2014-10-30 DIAGNOSIS — E782 Mixed hyperlipidemia: Secondary | ICD-10-CM | POA: Diagnosis not present

## 2014-10-30 DIAGNOSIS — I10 Essential (primary) hypertension: Secondary | ICD-10-CM | POA: Diagnosis not present

## 2014-11-04 DIAGNOSIS — N63 Unspecified lump in breast: Secondary | ICD-10-CM | POA: Diagnosis not present

## 2014-11-09 DIAGNOSIS — N6011 Diffuse cystic mastopathy of right breast: Secondary | ICD-10-CM | POA: Diagnosis not present

## 2014-11-09 DIAGNOSIS — D241 Benign neoplasm of right breast: Secondary | ICD-10-CM | POA: Diagnosis not present

## 2014-11-13 DIAGNOSIS — I639 Cerebral infarction, unspecified: Secondary | ICD-10-CM

## 2014-11-13 HISTORY — DX: Cerebral infarction, unspecified: I63.9

## 2014-11-18 DIAGNOSIS — D241 Benign neoplasm of right breast: Secondary | ICD-10-CM | POA: Diagnosis not present

## 2014-11-23 ENCOUNTER — Encounter: Payer: Self-pay | Admitting: Neurology

## 2014-11-24 ENCOUNTER — Ambulatory Visit (INDEPENDENT_AMBULATORY_CARE_PROVIDER_SITE_OTHER): Payer: Medicare Other | Admitting: Neurology

## 2014-11-24 ENCOUNTER — Encounter: Payer: Self-pay | Admitting: Neurology

## 2014-11-24 VITALS — BP 147/89 | HR 89 | Ht 65.0 in | Wt 179.4 lb

## 2014-11-24 DIAGNOSIS — I718 Aortic aneurysm of unspecified site, ruptured: Secondary | ICD-10-CM

## 2014-11-24 DIAGNOSIS — I671 Cerebral aneurysm, nonruptured: Secondary | ICD-10-CM

## 2014-11-24 DIAGNOSIS — G4489 Other headache syndrome: Secondary | ICD-10-CM

## 2014-11-24 NOTE — Progress Notes (Signed)
[facility]    Provider:  Dr Jaynee Eagles Referring Provider: Neale Burly, MD Primary Care Physician:  No primary care provider on file.  CC:  Aneurysm  HPI:  Diane Hutchinson is a 69 y.o. female here as a referral from Dr. Sherrie Sport for Aneurysm. She has a PMHx of right mca artery aneurysm clipping in 1993 and coiling of right ruptured pica aneurysm on 11/08/2009, HTN, HLD, COPD, Depression, Tobacco abuse  Here because need an operation on the right breast and they wanted to see neurology to ensure it is safe due to aneurysms. She can't be in the MRI machine. She had aneurysm repar in the past. She went to [facility] to talk to a specialist.  She had seen a doctor in [place] and also saw Devashwar. She is still having headaches but not as often, she is having them occasionally and they are not significantly painful, they last briefly maybe a few hours at most, pressure all over, she is not concerned about them and they are nothing like the headaches she had with SAH. No numbness, no tingling, no focal neurologic symptoms, no vomiting, no neck stiffness, no vision loss or vision changes.  She can't go to [facility], and she prefers not to have another cerebral angiogram at [place] or with dr. Estanislado Pandy.   Reviewed notes, labs and imaging from outside physicians, which showed: She was admitted to [facility] on 11/08/2009 with severe headache, nausea and vomiting and CT scan showed SAH. Cerebral angiogram revealed a right pica aneurysm, left MCA aneurysm and right mca(clipped in 1993) aneurysm. The right pica aneurysm had ruptured and Dr. Patrecia Pour performed coil obliteration.   Review of Systems: Patient complains of symptoms per HPI as well as the following symptoms: easy bruising, easy bleeding, headache, constipation, depression, anxiety, decreased energy, headache. Pertinent negatives per HPI. All others negative.   History   Social History  . Marital  Status: Single    Spouse Name: N/A    Number of Children: 1  . Years of Education: GED   Occupational History  . Retired     Social History Main Topics  . Smoking status: Current Every Day Smoker  . Smokeless tobacco: Never Used  . Alcohol Use: No  . Drug Use: No  . Sexual Activity: Not on file   Other Topics Concern  . Not on file   Social History Narrative   Patient lives at home with husband Diane Hutchinson   Patient has 1 child.    Patient has her GED   Patient is right handed.        History reviewed. No pertinent family history.  Past Medical History  Diagnosis Date  . Migraine   . Depression   . Anxiety     Past Surgical History  Procedure Laterality Date  . [place] surgery    . Eye surgery    . Cholecystectomy      Current Outpatient Prescriptions  Medication Sig Dispense Refill  . albuterol (PROVENTIL) (2.5 MG/3ML) 0.083% nebulizer solution Take 2.5 mg by nebulization every 6 (six) hours as needed for wheezing or shortness of breath.    Marland Kitchen alendronate (FOSAMAX) 70 MG tablet Take 70 mg by mouth once a week. Take with a full glass of water on an empty stomach.    Marland Kitchen atorvastatin (LIPITOR) 40 MG tablet Take 40 mg by mouth daily.    Marland Kitchen gemfibrozil (LOPID) 600 MG tablet Take 600 mg by mouth 2 (two)  times daily before a meal.    . ipratropium (ATROVENT) 0.02 % nebulizer solution Take 0.5 mg by nebulization 4 (four) times daily.    . meloxicam (MOBIC) 15 MG tablet Take 15 mg by mouth daily.    . metoprolol succinate (TOPROL XL) 25 MG 24 hr tablet Take 25 mg by mouth daily.    . risperiDONE (RISPERDAL) 0.5 MG tablet Take 0.5 mg by mouth at bedtime.    . sertraline (ZOLOFT) 100 MG tablet   4   No current facility-administered medications for this visit.    Allergies as of 11/24/2014 - Review Complete 11/24/2014  Allergen Reaction Noted  . Codeine    . Meperidine hcl    . Morphine    . Sulfonamide derivatives      Vitals: BP 147/89 mmHg  Pulse 89  Ht 5\' 5"  (1.651 m)   Wt 179 lb 6.4 oz (81.375 kg)  BMI 29.85 kg/m2 Last Weight:  Wt Readings from Last 1 Encounters:  11/24/14 179 lb 6.4 oz (81.375 kg)   Last Height:   Ht Readings from Last 1 Encounters:  11/24/14 5\' 5"  (1.651 m)    Physical exam: Exam: Gen: NAD, conversant, well nourised, obese, well groomed                     CV: RRR, no MRG. No Carotid Bruits. No peripheral edema, warm, nontender Eyes: Conjunctivae clear without exudates or hemorrhage  Neuro: Detailed Neurologic Exam  Speech:    Speech is normal; fluent and spontaneous with normal comprehension.  Cognition:    The patient is oriented to person, place, and time;     recent and remote memory intact;     language fluent;     normal attention, concentration,     fund of knowledge Cranial Nerves:    The pupils are equal, round, and reactive to light. The fundi are normal and spontaneous venous pulsations are present. Visual fields are full to finger confrontation. Extraocular movements are intact. Trigeminal sensation is intact and the muscles of mastication are normal. The face is symmetric. The palate elevates in the midline. Hearing intact. Voice is normal. Shoulder shrug is normal. The tongue has normal motion without fasciculations.   Coordination:    Normal finger to nose and heel to shin.  Gait:    Normal native gait  Motor Observation:    No asymmetry, no atrophy, and no involuntary movements noted. Tone:    Normal muscle tone.    Posture:    Posture is normal. normal erect    Strength:    Strength is V/V in the upper and lower limbs.      Sensation:     Intact to LT Reflex Exam:  DTR's:    Deep tendon reflexes in the upper and lower extremities are normal bilaterally.   Toes:    The toes are downgoing bilaterally.   Clonus:    Clonus is absent.    Assessment/Plan:  69 year old female PMHx HTN, HLD, COPD, Depression, Tobacco abuse with multiple aneurysms and previous SAH after ruptured aneurysm here  for evaluation. She was admitted to [facility] on 11/08/2009 with severe headache, nausea and vomiting and CT scan showed SAH. Cerebral angiogram revealed a right pica aneurysm, left MCA aneurysm and right mca(clipped in 1993) aneurysm. The right pica aneurysm had ruptured and Dr. Patrecia Pour performed coil obliteration. The right MCA aneurysm was clipped in 1993. Per cerebral angiogram in 2011, appears as though  she has an untreated 4.5 mm x 3 mm saccular aneurysm arising from the left MCA.   Will order CTA of the head to evaluate aneurysm. Patient does not want to go back to [facility] or back to [facility] if a cerebral angiogram is needed. Could consider [facility]. Will order BMP. Highly encouraged smoking cessation.   Sarina Ill, MD  [facility] [address] [place], [place] [address]  Phone [phone] Fax [phone]  A total of 45 minutes was spent in with this patient. Over half this time was spent on counseling patient on the diagnosis of aneurysm and different diagnostic and therapeutic options available.

## 2014-11-24 NOTE — Patient Instructions (Signed)
Overall you are doing fairly well but I do want to suggest a few things today:   Remember to drink plenty of fluid, eat healthy meals and do not skip any meals. Try to eat protein with a every meal and eat a healthy snack such as fruit or nuts in between meals. Try to keep a regular sleep-wake schedule and try to exercise daily, particularly in the form of walking, 20-30 minutes a day, if you can.  As far as diagnostic testing: CTA of the vessels of the head, lab test  Please call us with any interim questions, concerns, problems, updates or refill requests.   Please also call us for any test results so we can go over those with you on the phone.  My clinical assistant and will answer any of your questions and relay your messages to me and also relay most of my messages to you.   Our phone number is [phone]. We also have an after hours call service for urgent matters and there is a physician on-call for urgent questions. For any emergencies you know to call 911 or go to the nearest emergency room

## 2014-11-25 LAB — BASIC METABOLIC PANEL
BUN/Creatinine Ratio: 20 (ref 11–26)
BUN: 18 mg/dL (ref 8–27)
CALCIUM: 9.8 mg/dL (ref 8.7–10.3)
CO2: 25 mmol/L (ref 18–29)
CREATININE: 0.92 mg/dL (ref 0.57–1.00)
Chloride: 103 mmol/L (ref 97–108)
GFR, EST AFRICAN AMERICAN: 74 mL/min/{1.73_m2} (ref >59)
GFR, EST NON AFRICAN AMERICAN: 64 mL/min/{1.73_m2} (ref >59)
GLUCOSE: 146 mg/dL — AB (ref 65–99)
Potassium: 4 mmol/L (ref 3.5–5.2)
Sodium: 143 mmol/L (ref 134–144)

## 2014-11-28 ENCOUNTER — Encounter: Payer: Self-pay | Admitting: Neurology

## 2014-12-01 ENCOUNTER — Telehealth: Payer: Self-pay | Admitting: Neurology

## 2014-12-01 NOTE — Telephone Encounter (Signed)
Patient is calling to ask question about CTA of aneurysm that has been scheduled for 1/21.  Please call.

## 2014-12-02 NOTE — Telephone Encounter (Signed)
Patient has all her CT questions answered but still wants Dr. Jaynee Eagles to call her for a second. Not an emergency

## 2014-12-03 ENCOUNTER — Ambulatory Visit
Admission: RE | Admit: 2014-12-03 | Discharge: 2014-12-03 | Disposition: A | Discharge status: Home / Self Care | Payer: Medicare Other | Source: Ambulatory Visit | Attending: Neurology | Admitting: Neurology

## 2014-12-03 DIAGNOSIS — I671 Cerebral aneurysm, nonruptured: Secondary | ICD-10-CM | POA: Diagnosis not present

## 2014-12-03 DIAGNOSIS — R42 Dizziness and giddiness: Secondary | ICD-10-CM | POA: Diagnosis not present

## 2014-12-03 DIAGNOSIS — I718 Aortic aneurysm of unspecified site, ruptured: Secondary | ICD-10-CM

## 2014-12-03 DIAGNOSIS — R51 Headache: Secondary | ICD-10-CM | POA: Diagnosis not present

## 2014-12-03 MED ORDER — IOHEXOL 350 MG/ML SOLN
80.0000 mL | Freq: Once | INTRAVENOUS | Status: AC | PRN
  Administered 2014-12-03: 80 mL via INTRAVENOUS

## 2014-12-07 ENCOUNTER — Other Ambulatory Visit: Payer: Self-pay | Admitting: Neurology

## 2014-12-07 ENCOUNTER — Encounter: Payer: Self-pay | Admitting: Neurology

## 2014-12-07 DIAGNOSIS — I671 Cerebral aneurysm, nonruptured: Secondary | ICD-10-CM

## 2014-12-07 NOTE — Telephone Encounter (Signed)
Spoke to patient and relayed results below from her CTA. Risk for rupture of aneurysm is based on size and location.The 5-year rates of rupture for those 7 to 50mm was 2.6 percent in one study, with lower rates of aneurysmal rupture for smaller aneurysms. Risk of rupture may increase under general anesthesia. Significant risk factors include HTN during procedure. Patient does have a stable 4.2mm aneurysm and risks vs benefits of surgery should be discussed with patient given that rupture can cause SAH and significant morbidity and mortality. Will refer patient to Interventional Radiology for evaluation of repair. Called and left message for her surgeon, Dr. Karlyn Agee at [phone].    IMPRESSION: 1. Prior right MCA bifurcation aneurysm clipping and prior right PICA aneurysm coiling without evidence of residual/recurrent aneurysm. 2. Unchanged 4.5 mm left MCA bifurcation aneurysm. 3. No acute intracranial abnormality.

## 2014-12-07 NOTE — Telephone Encounter (Signed)
Spoke with Dr. Ladona Horns and relayed message. Will fax him a letter with the information. Referred patient to dr Duwayne Heck for evaluation of aneurysm. thanks

## 2014-12-14 DIAGNOSIS — D241 Benign neoplasm of right breast: Secondary | ICD-10-CM | POA: Diagnosis not present

## 2014-12-15 DIAGNOSIS — F332 Major depressive disorder, recurrent severe without psychotic features: Secondary | ICD-10-CM | POA: Diagnosis not present

## 2014-12-16 DIAGNOSIS — Z6829 Body mass index (BMI) 29.0-29.9, adult: Secondary | ICD-10-CM | POA: Diagnosis not present

## 2014-12-16 DIAGNOSIS — I671 Cerebral aneurysm, nonruptured: Secondary | ICD-10-CM | POA: Diagnosis not present

## 2014-12-16 DIAGNOSIS — I609 Nontraumatic subarachnoid hemorrhage, unspecified: Secondary | ICD-10-CM | POA: Diagnosis not present

## 2014-12-22 ENCOUNTER — Other Ambulatory Visit (HOSPITAL_COMMUNITY): Payer: Self-pay | Admitting: Neurosurgery

## 2014-12-23 ENCOUNTER — Other Ambulatory Visit (HOSPITAL_COMMUNITY): Payer: Self-pay | Admitting: Neurosurgery

## 2014-12-23 DIAGNOSIS — I729 Aneurysm of unspecified site: Secondary | ICD-10-CM

## 2015-01-07 ENCOUNTER — Other Ambulatory Visit (HOSPITAL_COMMUNITY): Payer: Self-pay | Admitting: Interventional Radiology

## 2015-01-07 ENCOUNTER — Ambulatory Visit (HOSPITAL_COMMUNITY)
Admission: RE | Admit: 2015-01-07 | Discharge: 2015-01-07 | Disposition: A | Discharge status: Home / Self Care | Payer: Medicare Other | Source: Ambulatory Visit | Attending: Interventional Radiology | Admitting: Interventional Radiology

## 2015-01-07 DIAGNOSIS — R51 Headache: Secondary | ICD-10-CM | POA: Diagnosis not present

## 2015-01-07 DIAGNOSIS — I671 Cerebral aneurysm, nonruptured: Secondary | ICD-10-CM

## 2015-01-11 ENCOUNTER — Other Ambulatory Visit (HOSPITAL_COMMUNITY): Payer: Self-pay | Admitting: Interventional Radiology

## 2015-01-11 DIAGNOSIS — I729 Aneurysm of unspecified site: Secondary | ICD-10-CM

## 2015-01-11 DIAGNOSIS — D241 Benign neoplasm of right breast: Secondary | ICD-10-CM | POA: Diagnosis not present

## 2015-01-12 ENCOUNTER — Ambulatory Visit (HOSPITAL_COMMUNITY): Admission: RE | Admit: 2015-01-12 | Payer: Medicare Other | Source: Ambulatory Visit

## 2015-01-13 ENCOUNTER — Other Ambulatory Visit: Payer: Self-pay | Admitting: Radiology

## 2015-01-18 ENCOUNTER — Other Ambulatory Visit: Payer: Self-pay | Admitting: Radiology

## 2015-01-18 ENCOUNTER — Inpatient Hospital Stay (HOSPITAL_COMMUNITY)
Admission: RE | Admit: 2015-01-18 | Discharge: 2015-01-18 | Disposition: A | Discharge status: Home / Self Care | Payer: Medicare Other | Source: Ambulatory Visit

## 2015-01-18 ENCOUNTER — Encounter (HOSPITAL_COMMUNITY): Payer: Self-pay

## 2015-01-18 HISTORY — DX: Gastro-esophageal reflux disease without esophagitis: K21.9

## 2015-01-18 HISTORY — DX: Essential (primary) hypertension: I10

## 2015-01-18 HISTORY — DX: Cerebral infarction, unspecified: I63.9

## 2015-01-18 HISTORY — DX: Chronic obstructive pulmonary disease, unspecified: J44.9

## 2015-01-18 HISTORY — DX: Unspecified osteoarthritis, unspecified site: M19.90

## 2015-01-18 LAB — COMPREHENSIVE METABOLIC PANEL
ALBUMIN: 3.9 g/dL (ref 3.5–5.2)
ALK PHOS: 94 U/L (ref 39–117)
ALT: 23 U/L (ref 0–35)
ANION GAP: 9 (ref 5–15)
AST: 19 U/L (ref 0–37)
BILIRUBIN TOTAL: 0.7 mg/dL (ref 0.3–1.2)
BUN: 12 mg/dL (ref 6–23)
CHLORIDE: 105 mmol/L (ref 96–112)
CO2: 26 mmol/L (ref 19–32)
Calcium: 8.6 mg/dL (ref 8.4–10.5)
Creatinine, Ser: 1.05 mg/dL (ref 0.50–1.10)
GFR calc Af Amer: 62 mL/min — ABNORMAL LOW (ref >90)
GFR calc non Af Amer: 53 mL/min — ABNORMAL LOW (ref >90)
Glucose, Bld: 181 mg/dL — ABNORMAL HIGH (ref 70–99)
POTASSIUM: 3.8 mmol/L (ref 3.5–5.1)
SODIUM: 140 mmol/L (ref 135–145)
Total Protein: 6.4 g/dL (ref 6.0–8.3)

## 2015-01-18 LAB — CBC WITH DIFFERENTIAL/PLATELET
BASOS PCT: 0 % (ref 0–1)
Basophils Absolute: 0 10*3/uL (ref 0.0–0.1)
Eosinophils Absolute: 0.2 10*3/uL (ref 0.0–0.7)
Eosinophils Relative: 2 % (ref 0–5)
HEMATOCRIT: 43.2 % (ref 36.0–46.0)
HEMOGLOBIN: 15 g/dL (ref 12.0–15.0)
LYMPHS ABS: 3.1 10*3/uL (ref 0.7–4.0)
LYMPHS PCT: 34 % (ref 12–46)
MCH: 33.3 pg (ref 26.0–34.0)
MCHC: 34.7 g/dL (ref 30.0–36.0)
MCV: 95.8 fL (ref 78.0–100.0)
MONOS PCT: 7 % (ref 3–12)
Monocytes Absolute: 0.7 10*3/uL (ref 0.1–1.0)
NEUTROS ABS: 5.1 10*3/uL (ref 1.7–7.7)
Neutrophils Relative %: 57 % (ref 43–77)
Platelets: 217 10*3/uL (ref 150–400)
RBC: 4.51 MIL/uL (ref 3.87–5.11)
RDW: 12.9 % (ref 11.5–15.5)
WBC: 9.1 10*3/uL (ref 4.0–10.5)

## 2015-01-18 LAB — PROTIME-INR
INR: 0.95 (ref 0.00–1.49)
PROTHROMBIN TIME: 12.8 s (ref 11.6–15.2)

## 2015-01-18 LAB — PLATELET INHIBITION P2Y12: Platelet Function  P2Y12: 113 [PRU] — ABNORMAL LOW (ref 194–418)

## 2015-01-18 LAB — APTT: APTT: 31 s (ref 24–37)

## 2015-01-18 NOTE — Progress Notes (Signed)
Pt. Confused about her appt. Today, came for bld. To be drawn at Radiol[facility] but then she was told that she could leave.  SDW call done, history rec'd. She is informed to arrive to [facility] on 01/20/2015 at [facility]

## 2015-01-18 NOTE — Progress Notes (Signed)
Call to [facility], Dr. Arlean Hopping office, left voicemail for her to call me.

## 2015-01-19 NOTE — Progress Notes (Signed)
Called Patient to inform her to be here at 0630 instead of 0600, since tomorrow is Wednesday and surgeries start later.Pt voiced understanding.

## 2015-01-20 ENCOUNTER — Ambulatory Visit (HOSPITAL_COMMUNITY): Payer: Medicare Other | Admitting: Anesthesiology

## 2015-01-20 ENCOUNTER — Encounter (HOSPITAL_COMMUNITY): Payer: Self-pay

## 2015-01-20 ENCOUNTER — Encounter (HOSPITAL_COMMUNITY): Payer: Self-pay | Admitting: Anesthesiology

## 2015-01-20 ENCOUNTER — Encounter (HOSPITAL_COMMUNITY): Payer: Self-pay | Admitting: *Deleted

## 2015-01-20 ENCOUNTER — Encounter (HOSPITAL_COMMUNITY)
Admission: RE | Disposition: A | Discharge status: Rehabilitation Facility | Payer: Self-pay | Source: Ambulatory Visit | Attending: Interventional Radiology

## 2015-01-20 ENCOUNTER — Ambulatory Visit (HOSPITAL_COMMUNITY)
Admission: RE | Admit: 2015-01-20 | Discharge: 2015-01-20 | Disposition: A | Discharge status: Home / Self Care | Payer: Medicare Other | Source: Ambulatory Visit | Attending: Interventional Radiology | Admitting: Interventional Radiology

## 2015-01-20 ENCOUNTER — Inpatient Hospital Stay (HOSPITAL_COMMUNITY): Payer: Medicare Other

## 2015-01-20 ENCOUNTER — Inpatient Hospital Stay (HOSPITAL_COMMUNITY)
Admission: RE | Admit: 2015-01-20 | Discharge: 2015-01-22 | DRG: 025 | Disposition: A | Discharge status: Rehabilitation Facility | Payer: Medicare Other | Source: Ambulatory Visit | Attending: Interventional Radiology | Admitting: Interventional Radiology

## 2015-01-20 DIAGNOSIS — Z7983 Long term (current) use of bisphosphonates: Secondary | ICD-10-CM | POA: Diagnosis not present

## 2015-01-20 DIAGNOSIS — K219 Gastro-esophageal reflux disease without esophagitis: Secondary | ICD-10-CM | POA: Diagnosis present

## 2015-01-20 DIAGNOSIS — Z79899 Other long term (current) drug therapy: Secondary | ICD-10-CM

## 2015-01-20 DIAGNOSIS — Y92239 Unspecified place in hospital as the place of occurrence of the external cause: Secondary | ICD-10-CM

## 2015-01-20 DIAGNOSIS — Z23 Encounter for immunization: Secondary | ICD-10-CM

## 2015-01-20 DIAGNOSIS — I97821 Postprocedural cerebrovascular infarction during other surgery: Secondary | ICD-10-CM | POA: Diagnosis not present

## 2015-01-20 DIAGNOSIS — Z9109 Other allergy status, other than to drugs and biological substances: Secondary | ICD-10-CM | POA: Diagnosis not present

## 2015-01-20 DIAGNOSIS — R270 Ataxia, unspecified: Secondary | ICD-10-CM | POA: Insufficient documentation

## 2015-01-20 DIAGNOSIS — F329 Major depressive disorder, single episode, unspecified: Secondary | ICD-10-CM | POA: Diagnosis present

## 2015-01-20 DIAGNOSIS — F410 Panic disorder [episodic paroxysmal anxiety] without agoraphobia: Secondary | ICD-10-CM | POA: Diagnosis present

## 2015-01-20 DIAGNOSIS — G8190 Hemiplegia, unspecified affecting unspecified side: Secondary | ICD-10-CM | POA: Diagnosis not present

## 2015-01-20 DIAGNOSIS — F419 Anxiety disorder, unspecified: Secondary | ICD-10-CM | POA: Diagnosis present

## 2015-01-20 DIAGNOSIS — I729 Aneurysm of unspecified site: Secondary | ICD-10-CM

## 2015-01-20 DIAGNOSIS — Z7902 Long term (current) use of antithrombotics/antiplatelets: Secondary | ICD-10-CM | POA: Diagnosis not present

## 2015-01-20 DIAGNOSIS — J449 Chronic obstructive pulmonary disease, unspecified: Secondary | ICD-10-CM | POA: Diagnosis present

## 2015-01-20 DIAGNOSIS — Z885 Allergy status to narcotic agent status: Secondary | ICD-10-CM | POA: Diagnosis not present

## 2015-01-20 DIAGNOSIS — F17210 Nicotine dependence, cigarettes, uncomplicated: Secondary | ICD-10-CM | POA: Diagnosis present

## 2015-01-20 DIAGNOSIS — I6339 Cerebral infarction due to thrombosis of other cerebral artery: Secondary | ICD-10-CM | POA: Diagnosis not present

## 2015-01-20 DIAGNOSIS — R278 Other lack of coordination: Secondary | ICD-10-CM | POA: Diagnosis not present

## 2015-01-20 DIAGNOSIS — I671 Cerebral aneurysm, nonruptured: Secondary | ICD-10-CM | POA: Diagnosis present

## 2015-01-20 DIAGNOSIS — E785 Hyperlipidemia, unspecified: Secondary | ICD-10-CM | POA: Diagnosis present

## 2015-01-20 DIAGNOSIS — Z7982 Long term (current) use of aspirin: Secondary | ICD-10-CM

## 2015-01-20 DIAGNOSIS — Z882 Allergy status to sulfonamides status: Secondary | ICD-10-CM | POA: Diagnosis not present

## 2015-01-20 DIAGNOSIS — I6340 Cerebral infarction due to embolism of unspecified cerebral artery: Secondary | ICD-10-CM | POA: Diagnosis not present

## 2015-01-20 DIAGNOSIS — I679 Cerebrovascular disease, unspecified: Secondary | ICD-10-CM | POA: Diagnosis not present

## 2015-01-20 DIAGNOSIS — I10 Essential (primary) hypertension: Secondary | ICD-10-CM | POA: Diagnosis present

## 2015-01-20 DIAGNOSIS — Y842 Radiological procedure and radiotherapy as the cause of abnormal reaction of the patient, or of later complication, without mention of misadventure at the time of the procedure: Secondary | ICD-10-CM | POA: Diagnosis not present

## 2015-01-20 DIAGNOSIS — Z955 Presence of coronary angioplasty implant and graft: Secondary | ICD-10-CM

## 2015-01-20 DIAGNOSIS — I639 Cerebral infarction, unspecified: Secondary | ICD-10-CM | POA: Insufficient documentation

## 2015-01-20 DIAGNOSIS — M1990 Unspecified osteoarthritis, unspecified site: Secondary | ICD-10-CM | POA: Diagnosis present

## 2015-01-20 DIAGNOSIS — G8194 Hemiplegia, unspecified affecting left nondominant side: Secondary | ICD-10-CM | POA: Diagnosis present

## 2015-01-20 HISTORY — PX: RADIOLOGY WITH ANESTHESIA: SHX6223

## 2015-01-20 LAB — MRSA PCR SCREENING: MRSA by PCR: NEGATIVE

## 2015-01-20 LAB — POCT ACTIVATED CLOTTING TIME
Activated Clotting Time: 153 seconds
Activated Clotting Time: 165 seconds
Activated Clotting Time: 177 seconds

## 2015-01-20 LAB — HEPARIN LEVEL (UNFRACTIONATED): Heparin Unfractionated: 0.14 IU/mL — ABNORMAL LOW (ref 0.30–0.70)

## 2015-01-20 SURGERY — RADIOLOGY WITH ANESTHESIA
Anesthesia: Monitor Anesthesia Care

## 2015-01-20 MED ORDER — LIDOCAINE HCL 1 % IJ SOLN
INTRAMUSCULAR | Status: AC
Start: 2015-01-20 — End: 2015-01-20
  Filled 2015-01-20: qty 20

## 2015-01-20 MED ORDER — ONDANSETRON HCL 4 MG/2ML IJ SOLN
INTRAMUSCULAR | Status: DC | PRN
  Administered 2015-01-20: 4 mg via INTRAVENOUS

## 2015-01-20 MED ORDER — LACTATED RINGERS IV SOLN
INTRAVENOUS | Status: DC | PRN
  Administered 2015-01-20 (×3): via INTRAVENOUS

## 2015-01-20 MED ORDER — HEPARIN SODIUM (PORCINE) 1000 UNIT/ML IJ SOLN
INTRAMUSCULAR | Status: DC | PRN
  Administered 2015-01-20 (×2): 500 [IU] via INTRAVENOUS
  Administered 2015-01-20 (×2): 1000 [IU] via INTRAVENOUS
  Administered 2015-01-20 (×2): 500 [IU] via INTRAVENOUS
  Administered 2015-01-20: 1000 [IU] via INTRAVENOUS

## 2015-01-20 MED ORDER — PNEUMOCOCCAL VAC POLYVALENT 25 MCG/0.5ML IJ INJ
0.5000 mL | INJECTION | INTRAMUSCULAR | Status: AC
  Administered 2015-01-21: 0.5 mL via INTRAMUSCULAR
  Filled 2015-01-20: qty 0.5

## 2015-01-20 MED ORDER — SODIUM CHLORIDE 0.9 % IV SOLN
10.0000 mg | INTRAVENOUS | Status: DC | PRN
  Administered 2015-01-20: 25 ug/min via INTRAVENOUS

## 2015-01-20 MED ORDER — GLYCOPYRROLATE 0.2 MG/ML IJ SOLN
INTRAMUSCULAR | Status: DC | PRN
  Administered 2015-01-20 (×2): .4 mg via INTRAVENOUS

## 2015-01-20 MED ORDER — NICARDIPINE HCL IN NACL 20-0.86 MG/200ML-% IV SOLN
5.0000 mg/h | INTRAVENOUS | Status: DC
  Filled 2015-01-20: qty 200

## 2015-01-20 MED ORDER — SODIUM CHLORIDE 0.9 % IV SOLN: INTRAVENOUS | Status: DC

## 2015-01-20 MED ORDER — NIMODIPINE 30 MG PO CAPS
0.0000 mg | ORAL_CAPSULE | ORAL | Status: AC
  Administered 2015-01-20: 60 mg via ORAL
  Filled 2015-01-20: qty 2

## 2015-01-20 MED ORDER — HEPARIN (PORCINE) IN NACL 100-0.45 UNIT/ML-% IJ SOLN
700.0000 [IU]/h | INTRAMUSCULAR | Status: DC
  Administered 2015-01-20: 700 [IU]/h via INTRAVENOUS
  Filled 2015-01-20: qty 250

## 2015-01-20 MED ORDER — MIDAZOLAM HCL 5 MG/5ML IJ SOLN
INTRAMUSCULAR | Status: DC | PRN
  Administered 2015-01-20 (×2): 1 mg via INTRAVENOUS

## 2015-01-20 MED ORDER — NITROGLYCERIN 1 MG/10 ML FOR IR/CATH LAB
INTRA_ARTERIAL | Status: AC
  Administered 2015-01-20 (×2): 25 ug
  Administered 2015-01-20: 25 ug via INTRA_ARTERIAL
  Filled 2015-01-20: qty 10

## 2015-01-20 MED ORDER — FENTANYL CITRATE 0.05 MG/ML IJ SOLN
INTRAMUSCULAR | Status: DC | PRN
  Administered 2015-01-20: 100 ug via INTRAVENOUS
  Administered 2015-01-20: 50 ug via INTRAVENOUS
  Administered 2015-01-20: 100 ug via INTRAVENOUS

## 2015-01-20 MED ORDER — CEFAZOLIN SODIUM-DEXTROSE 2-3 GM-% IV SOLR: 2.0000 g | Freq: Once | INTRAVENOUS | Status: DC

## 2015-01-20 MED ORDER — LIDOCAINE HCL 4 % MT SOLN
OROMUCOSAL | Status: DC | PRN
Start: 2015-01-20 — End: 2015-01-20
  Administered 2015-01-20: 4 mL via TOPICAL

## 2015-01-20 MED ORDER — ASPIRIN 325 MG PO TABS
325.0000 mg | ORAL_TABLET | Freq: Every day | ORAL | Status: DC
  Administered 2015-01-21 – 2015-01-22 (×2): 325 mg via ORAL
  Filled 2015-01-20 (×3): qty 1

## 2015-01-20 MED ORDER — CEFAZOLIN SODIUM-DEXTROSE 2-3 GM-% IV SOLR
INTRAVENOUS | Status: AC
Start: 2015-01-20 — End: 2015-01-20
  Administered 2015-01-20: 2 g via INTRAVENOUS
  Filled 2015-01-20: qty 50

## 2015-01-20 MED ORDER — DEXAMETHASONE SODIUM PHOSPHATE 10 MG/ML IJ SOLN
INTRAMUSCULAR | Status: DC | PRN
  Administered 2015-01-20: 8 mg via INTRAVENOUS

## 2015-01-20 MED ORDER — IOHEXOL 350 MG/ML SOLN
80.0000 mL | Freq: Once | INTRAVENOUS | Status: AC | PRN
  Administered 2015-01-20: 80 mL via INTRAVENOUS

## 2015-01-20 MED ORDER — ASPIRIN EC 325 MG PO TBEC: 325.0000 mg | DELAYED_RELEASE_TABLET | Freq: Once | ORAL | Status: DC

## 2015-01-20 MED ORDER — ACETAMINOPHEN 500 MG PO TABS
1000.0000 mg | ORAL_TABLET | Freq: Four times a day (QID) | ORAL | Status: DC | PRN
  Administered 2015-01-21: 1000 mg via ORAL
  Filled 2015-01-20: qty 2

## 2015-01-20 MED ORDER — LABETALOL HCL 5 MG/ML IV SOLN
INTRAVENOUS | Status: DC | PRN
  Administered 2015-01-20: 10 mg via INTRAVENOUS

## 2015-01-20 MED ORDER — CLOPIDOGREL BISULFATE 75 MG PO TABS
75.0000 mg | ORAL_TABLET | Freq: Every day | ORAL | Status: DC
  Administered 2015-01-21 – 2015-01-22 (×2): 75 mg via ORAL
  Filled 2015-01-20 (×3): qty 1

## 2015-01-20 MED ORDER — EPTIFIBATIDE 2 MG/ML IV SOLN
INTRAVENOUS | Status: AC
  Administered 2015-01-20: 2 mg
  Administered 2015-01-20: 2 mg via INTRA_ARTERIAL
  Filled 2015-01-20: qty 10

## 2015-01-20 MED ORDER — HYDROMORPHONE HCL 1 MG/ML IJ SOLN: 0.2500 mg | INTRAMUSCULAR | Status: DC | PRN

## 2015-01-20 MED ORDER — LIDOCAINE HCL (CARDIAC) 20 MG/ML IV SOLN
INTRAVENOUS | Status: DC | PRN
  Administered 2015-01-20: 40 mg via INTRAVENOUS
  Administered 2015-01-20: 60 mg via INTRAVENOUS

## 2015-01-20 MED ORDER — SODIUM CHLORIDE 0.9 % IV SOLN: Freq: Once | INTRAVENOUS | Status: DC

## 2015-01-20 MED ORDER — ROCURONIUM BROMIDE 100 MG/10ML IV SOLN
INTRAVENOUS | Status: DC | PRN
  Administered 2015-01-20: 40 mg via INTRAVENOUS

## 2015-01-20 MED ORDER — ONDANSETRON HCL 4 MG/2ML IJ SOLN: 4.0000 mg | Freq: Four times a day (QID) | INTRAMUSCULAR | Status: DC | PRN

## 2015-01-20 MED ORDER — ALBUTEROL SULFATE HFA 108 (90 BASE) MCG/ACT IN AERS
INHALATION_SPRAY | RESPIRATORY_TRACT | Status: DC | PRN
  Administered 2015-01-20: 2 via RESPIRATORY_TRACT

## 2015-01-20 MED ORDER — EPHEDRINE SULFATE 50 MG/ML IJ SOLN
INTRAMUSCULAR | Status: DC | PRN
  Administered 2015-01-20 (×2): 10 mg via INTRAVENOUS

## 2015-01-20 MED ORDER — ACETAMINOPHEN 650 MG RE SUPP: 650.0000 mg | Freq: Four times a day (QID) | RECTAL | Status: DC | PRN

## 2015-01-20 MED ORDER — PROPOFOL 10 MG/ML IV BOLUS
INTRAVENOUS | Status: DC | PRN
  Administered 2015-01-20: 200 mg via INTRAVENOUS

## 2015-01-20 MED ORDER — SODIUM CHLORIDE 0.9 % IV SOLN
INTRAVENOUS | Status: DC
  Administered 2015-01-20 – 2015-01-21 (×2): via INTRAVENOUS

## 2015-01-20 MED ORDER — ONDANSETRON HCL 4 MG/2ML IJ SOLN: 4.0000 mg | Freq: Once | INTRAMUSCULAR | Status: DC | PRN

## 2015-01-20 MED ORDER — NEOSTIGMINE METHYLSULFATE 10 MG/10ML IV SOLN
INTRAVENOUS | Status: DC | PRN
  Administered 2015-01-20: 2.5 mg via INTRAVENOUS

## 2015-01-20 MED ORDER — IOHEXOL 300 MG/ML  SOLN
150.0000 mL | Freq: Once | INTRAMUSCULAR | Status: AC | PRN
  Administered 2015-01-20: 150 mL via INTRAVENOUS

## 2015-01-20 MED ORDER — CLOPIDOGREL BISULFATE 75 MG PO TABS: 75.0000 mg | ORAL_TABLET | Freq: Once | ORAL | Status: DC

## 2015-01-20 MED ORDER — HEPARIN (PORCINE) IN NACL 100-0.45 UNIT/ML-% IJ SOLN
INTRAMUSCULAR | Status: AC
  Filled 2015-01-20: qty 250

## 2015-01-20 NOTE — Progress Notes (Signed)
ANTICOAGULATION CONSULT NOTE - Initial Consult  Pharmacy Consult for Heparin Indication: Dr. Estanislado Pandy procedure s/p cerebral angiogram  Allergies  Allergen Reactions  . Codeine Other (See Comments)    dellusions  . Meperidine Hcl Other (See Comments)    Hurts stomach  . Morphine Other (See Comments)    dellusions  . Sulfonamide Derivatives Other (See Comments)    Drives her nuts    Vital Signs: Temp: 98.2 F (36.8 C) (03/09 0629) Temp Source: Oral (03/09 0629) BP: 156/72 mmHg (03/09 0629) Pulse Rate: 55 (03/09 0629)  Labs:  Recent Labs  01/18/15 1308  HGB 15.0  HCT 43.2  PLT 217  APTT 31  LABPROT 12.8  INR 0.95  CREATININE 1.05    Estimated Creatinine Clearance: 56 mL/min (by C-G formula based on Cr of 1.05).   Medical History: Past Medical History  Diagnosis Date  . Migraine   . Depression   . Hypertension   . Stroke   . COPD (chronic obstructive pulmonary disease)   . Anxiety     h/o of panic attack  . GERD (gastroesophageal reflux disease)     occas. use of TUMS  . Arthritis     knees    Assessment: 69 year old female on heparin s/p cerebral angiogram on heparin until 7 am 01/21/15  Goal of Therapy:  Heparin level 0.1-0.25 units/ml Monitor platelets by anticoagulation protocol: Yes   Plan:  Heparin at 700 units / hr Heparin level at 11 pm  Follow up for dc at 7 am  Thank you. Anette Guarneri, PharmD [phone]  01/20/2015,2:00 PM

## 2015-01-20 NOTE — Sedation Documentation (Signed)
Pt will be put to sleep for continuation of procedure. MD spoke with familty in room and informed them of intervention needed.Family in agreement.

## 2015-01-20 NOTE — Sedation Documentation (Signed)
Foley inserted per MD

## 2015-01-20 NOTE — Anesthesia Preprocedure Evaluation (Signed)
Anesthesia Evaluation  Patient identified by MRN, date of birth, ID band Patient awake    Reviewed: Allergy & Precautions, NPO status , Patient's Chart, lab work & pertinent test results  Airway        Dental   Pulmonary COPDCurrent Smoker,          Cardiovascular hypertension,     Neuro/Psych  Headaches, CVA    GI/Hepatic GERD-  ,  Endo/Other    Renal/GU      Musculoskeletal  (+) Arthritis -,   Abdominal   Peds  Hematology   Anesthesia Other Findings   Reproductive/Obstetrics                             Anesthesia Physical Anesthesia Plan  ASA: III  Anesthesia Plan: General and MAC   Post-op Pain Management:    Induction: Intravenous  Airway Management Planned: Oral ETT  Additional Equipment: Arterial line  Intra-op Plan:   Post-operative Plan: Extubation in OR  Informed Consent: I have reviewed the patients History and Physical, chart, labs and discussed the procedure including the risks, benefits and alternatives for the proposed anesthesia with the patient or authorized representative who has indicated his/her understanding and acceptance.     Plan Discussed with: CRNA, Anesthesiologist and Surgeon  Anesthesia Plan Comments:         Anesthesia Quick Evaluation

## 2015-01-20 NOTE — Transfer of Care (Signed)
Immediate Anesthesia Transfer of Care Note  Patient: Diane Hutchinson  Procedure(s) Performed: Procedure(s): RADIOLOGY WITH ANESTHESIA (N/A)  Patient Location: PACU  Anesthesia Type:General  Level of Consciousness: awake, alert  and oriented  Airway & Oxygen Therapy: Patient Spontanous Breathing and Patient connected to face mask oxygen  Post-op Assessment: Report given to RN and Post -op Vital signs reviewed and stable  Post vital signs: Reviewed and stable  Last Vitals:  Filed Vitals:   01/20/15 0629  BP: 156/72  Pulse: 55  Temp: 36.8 C  Resp: 20    Complications: No apparent anesthesia complications

## 2015-01-20 NOTE — Code Documentation (Signed)
69yo female s/p L MCA aneurysm embolization and stent placement today.  Patient was admitted to 3M11 s/p procedure.  Patient was LKW at 1500.  Patient began c/o left arm paresthesias.  Bedside RN then noticed left arm ataxia and left leg weakness.  Dr. Estanislado Pandy notified and code stroke activated.  Stroke team to the bedside.  Patient transported to CT by bedside RN and Stroke RN.  CTA head and neck completed per Dr. Estanislado Pandy.  NIHSS 8, see documentation for details and code stroke times.  Patient with facial droop, left arm ataxia, left leg weakness, decreased sensation on the left and left sensory neglect.  Dr. Armida Sans reviewed images.  No acute stroke treatment at this time. Patient transported back to 3M11.  Bedside handoff with Marlowe Kays, RN.

## 2015-01-20 NOTE — Consult Note (Signed)
Referring Physician: Estanislado Pandy    Chief Complaint: Left arm and leg ataxia  HPI:                                                                                                                                         Diane Hutchinson is an 69 y.o. female who under went a Left MCA elective embolization using stent assistance today.  Post procedure she was noted to have left arm tingling and ataxia of left arm and leg.  Code stroke was called at 1500. Patient was brought to CT which showed no acute stroke.  CTA was then performed which again showed no large vessel occlusion. Patietn was not a tPA candidate secondary being on Heparin drip and elevated APTT.   Date last known well: Date: 01/20/2015 Time last known well: Time: 15:00 tPA Given: No: on Heparin drip Modified Rankin: Rankin Score=1    Past Medical History  Diagnosis Date  . Migraine   . Depression   . Hypertension   . Stroke   . COPD (chronic obstructive pulmonary disease)   . Anxiety     h/o of panic attack  . GERD (gastroesophageal reflux disease)     occas. use of TUMS  . Arthritis     knees    Past Surgical History  Procedure Laterality Date  . Baudette surgery    . Cholecystectomy    . Appendectomy    . Eye surgery      laser to both eyes for blocked tear ducts   . Brain surgery  1993    aneurysm  . Tubal ligation    . Hemorroidectomy      Family History  Problem Relation Age of Onset  . Stroke Neg Hx    Social History:  reports that she has been smoking.  She has never used smokeless tobacco. She reports that she does not drink alcohol or use illicit drugs.  Allergies:  Allergies  Allergen Reactions  . Codeine Other (See Comments)    dellusions  . Meperidine Hcl Other (See Comments)    Hurts stomach  . Morphine Other (See Comments)    dellusions  . Sulfonamide Derivatives Other (See Comments)    Drives her nuts    Medications:  Prior to Admission:  Prescriptions prior to admission  Medication Sig Dispense Refill Last Dose  . alendronate (FOSAMAX) 70 MG tablet Take 70 mg by mouth every Monday. Take with a full glass of water on an empty stomach.   Past Week at Unknown time  . ALPRAZolam (XANAX) 1 MG tablet Take 1 mg by mouth 3 (three) times daily as needed for anxiety.   01/19/2015 at Unknown time  . aspirin 81 MG tablet Take 324 mg by mouth once.   Taking  . atorvastatin (LIPITOR) 40 MG tablet Take 40 mg by mouth daily.   01/19/2015 at Unknown time  . Cholecalciferol (VITAMIN D PO) Take 1 tablet by mouth daily.   Past Month at Unknown time  . clopidogrel (PLAVIX) 75 MG tablet Take 75 mg by mouth daily.   Taking  . gemfibrozil (LOPID) 600 MG tablet Take 600 mg by mouth 2 (two) times daily before a meal.   01/19/2015 at Unknown time  . meloxicam (MOBIC) 15 MG tablet Take 15 mg by mouth daily.   01/19/2015 at Unknown time  . metoprolol succinate (TOPROL XL) 25 MG 24 hr tablet Take 25 mg by mouth at bedtime.    01/19/2015 at Unknown time  . Omega-3 Fatty Acids (FISH OIL PO) Take 1 capsule by mouth daily.   01/19/2015 at Unknown time  . risperiDONE (RISPERDAL) 0.5 MG tablet Take 0.5 mg by mouth at bedtime.   01/19/2015 at Unknown time  . sertraline (ZOLOFT) 100 MG tablet Take 200 mg by mouth at bedtime.   4 01/19/2015 at Unknown time  . traZODone (DESYREL) 150 MG tablet Take 300 mg by mouth at bedtime.   01/19/2015 at Unknown time  . VITAMIN E PO Take 1 tablet by mouth daily.   Past Week at Unknown time  . albuterol (PROVENTIL) (2.5 MG/3ML) 0.083% nebulizer solution Take 2.5 mg by nebulization every 6 (six) hours as needed for wheezing or shortness of breath.   More than a month at Unknown time  . ipratropium (ATROVENT) 0.02 % nebulizer solution Take 0.5 mg by nebulization every 6 (six) hours as needed for wheezing or shortness of breath.    More than a month at Unknown time    Scheduled: . [START ON 01/21/2015] aspirin  325 mg Oral Q breakfast  . [START ON 01/21/2015] clopidogrel  75 mg Oral Q breakfast  . [START ON 01/21/2015] pneumococcal 23 valent vaccine  0.5 mL Intramuscular Tomorrow-1000    ROS:                                                                                                                                       History obtained from the patient  General ROS: negative for - chills, fatigue, fever, night sweats, weight gain or weight loss Psychological ROS: negative for - behavioral disorder, hallucinations, memory difficulties, mood swings or suicidal ideation Ophthalmic ROS: negative for -  blurry vision, double vision, eye pain or loss of vision ENT ROS: negative for - epistaxis, nasal discharge, oral lesions, sore throat, tinnitus or vertigo Allergy and Immunology ROS: negative for - hives or itchy/watery eyes Hematological and Lymphatic ROS: negative for - bleeding problems, bruising or swollen lymph nodes Endocrine ROS: negative for - galactorrhea, hair pattern changes, polydipsia/polyuria or temperature intolerance Respiratory ROS: negative for - cough, hemoptysis, shortness of breath or wheezing Cardiovascular ROS: negative for - chest pain, dyspnea on exertion, edema or irregular heartbeat Gastrointestinal ROS: negative for - abdominal pain, diarrhea, hematemesis, nausea/vomiting or stool incontinence Genito-Urinary ROS: negative for - dysuria, hematuria, incontinence or urinary frequency/urgency Musculoskeletal ROS: negative for - joint swelling or muscular weakness Neurological ROS: as noted in HPI Dermatological ROS: negative for rash and skin lesion changes  Physical Examination:                                                                                                      Blood pressure 130/55, pulse 67, temperature 97 F (36.1 C), temperature source Oral, resp. rate 13, height 5\' 7"  (1.702 m), weight 80.74 kg (178 lb),  SpO2 95 %.  HEENT-  Normocephalic, no lesions, without obvious abnormality.  Normal external eye and conjunctiva.  Normal TM's bilaterally.  Normal auditory canals and external ears. Normal external nose, mucus membranes and septum.  Normal pharynx. Cardiovascular- S1, S2 normal, pulses palpable throughout   Lungs- chest clear, no wheezing, rales, normal symmetric air entry Abdomen- normal findings: bowel sounds normal Extremities- no edema Lymph-no adenopathy palpable Musculoskeletal-no joint tenderness, deformity or swelling Skin-warm and dry, no hyperpigmentation, vitiligo, or suspicious lesions  Neurological Examination Mental Status: Alert, oriented, thought content appropriate.  Speech fluent without evidence of aphasia.  Able to follow 3 step commands without difficulty. Cranial Nerves: II: Discs flat bilaterally; Visual fields grossly normal, pupils equal, round, reactive to light and accommodation III,IV, VI: ptosis not present, extra-ocular motions intact bilaterally V,VII: smile asymmetric on the left, facial light touch sensation on the left VIII: hearing normal bilaterally IX,X: uvula rises symmetrically XI: bilateral shoulder shrug XII: midline tongue extension Motor: Right : Upper extremity   5/5    Left:     Upper extremity   5/5  Lower extremity   5/5     Lower extremity   5/5 Tone and bulk:normal tone throughout; no atrophy noted Sensory: Pinprick and light touch decreased on the left arm and leg Deep Tendon Reflexes: 2+ and symmetric throughout Plantars: Right: downgoing   Left: downgoing Cerebellar: Dysmetria on left  finger-to-nose, and  heel-to-shin test Gait: not able to assess due to safety   Lab Results: Basic Metabolic Panel:  Recent Labs Lab 01/18/15 1308  NA 140  K 3.8  CL 105  CO2 26  GLUCOSE 181*  BUN 12  CREATININE 1.05  CALCIUM 8.6    Liver Function Tests:  Recent Labs Lab 01/18/15 1308  AST 19  ALT 23  ALKPHOS 94  BILITOT 0.7   PROT 6.4  ALBUMIN 3.9   No results for input(s): LIPASE, AMYLASE  in the last 168 hours. No results for input(s): AMMONIA in the last 168 hours.  CBC:  Recent Labs Lab 01/18/15 1308  WBC 9.1  NEUTROABS 5.1  HGB 15.0  HCT 43.2  MCV 95.8  PLT 217    Cardiac Enzymes: No results for input(s): CKTOTAL, CKMB, CKMBINDEX, TROPONINI in the last 168 hours.  Lipid Panel: No results for input(s): CHOL, TRIG, HDL, CHOLHDL, VLDL, LDLCALC in the last 168 hours.  CBG: No results for input(s): GLUCAP in the last 168 hours.  Microbiology: Results for orders placed or performed during the hospital encounter of 11/08/09  MRSA PCR Screening     Status: None   Collection Time: 11/09/09  3:30 AM  Result Value Ref Range Status   MRSA by PCR  NEGATIVE Final    NEGATIVE        The GeneXpert MRSA Assay (FDA approved for NASAL specimens only), is one component of a comprehensive MRSA colonization surveillance program. It is not intended to diagnose MRSA infection nor to guide or monitor treatment for MRSA infections.    Coagulation Studies:  Recent Labs  01/18/15 1308  LABPROT 12.8  INR 0.95    Imaging: No results found.     Assessment and plan discussed with with attending physician and they are in agreement.    Etta Quill PA-C [facility] [phone]  01/20/2015, 5:34 PM   Assessment: 69 y.o. female S/P left MCA aneurysm using stent assistance.  Post procedure patient was noted to have dysmetria on the left arm and leg with decreased sensation in left face, arm and leg. CT and CTA showed no acute infarct or large vessel occlusion. Patient was not a tPA candidate. Symptoms are more consistent with small vessel subcortical lacune infarct.   Stroke Risk Factors - hypertension   Recommendation: 1) repeat CT head in 24-48 hours.  2) continue home dose of ASA 3) further stroke recommendations per stroke team.   Patient seen and examined together with  physician assistant and I concur with the assessment and plan.  Dorian Pod, MD

## 2015-01-20 NOTE — Procedures (Signed)
S/P  Bilateral common carotid and Lt vertebral arteriograms,followed by staged embolization of widenecked LtMCA aneurysm using stent assistance .

## 2015-01-20 NOTE — Progress Notes (Signed)
Subjective: Patient is s/p Left MCA elective embolization using stent assistance today, extubated and c/o LUE tingling and weakness.  Allergies: Codeine; Meperidine hcl; Morphine; and Sulfonamide derivatives  Medications: Prior to Admission medications   Medication Sig Start Date End Date Taking? Authorizing Provider  alendronate (FOSAMAX) 70 MG tablet Take 70 mg by mouth every Monday. Take with a full glass of water on an empty stomach.   Yes Historical Provider, MD  ALPRAZolam Duanne Moron) 1 MG tablet Take 1 mg by mouth 3 (three) times daily as needed for anxiety.   Yes Historical Provider, MD  aspirin 81 MG tablet Take 324 mg by mouth once.   Yes Historical Provider, MD  atorvastatin (LIPITOR) 40 MG tablet Take 40 mg by mouth daily.   Yes Historical Provider, MD  Cholecalciferol (VITAMIN D PO) Take 1 tablet by mouth daily.   Yes Historical Provider, MD  clopidogrel (PLAVIX) 75 MG tablet Take 75 mg by mouth daily.   Yes Historical Provider, MD  gemfibrozil (LOPID) 600 MG tablet Take 600 mg by mouth 2 (two) times daily before a meal.   Yes Historical Provider, MD  meloxicam (MOBIC) 15 MG tablet Take 15 mg by mouth daily.   Yes Historical Provider, MD  metoprolol succinate (TOPROL XL) 25 MG 24 hr tablet Take 25 mg by mouth at bedtime.    Yes Historical Provider, MD  Omega-3 Fatty Acids (FISH OIL PO) Take 1 capsule by mouth daily.   Yes Historical Provider, MD  risperiDONE (RISPERDAL) 0.5 MG tablet Take 0.5 mg by mouth at bedtime.   Yes Historical Provider, MD  sertraline (ZOLOFT) 100 MG tablet Take 200 mg by mouth at bedtime.  11/16/14  Yes Historical Provider, MD  traZODone (DESYREL) 150 MG tablet Take 300 mg by mouth at bedtime.   Yes Historical Provider, MD  VITAMIN E PO Take 1 tablet by mouth daily.   Yes Historical Provider, MD  albuterol (PROVENTIL) (2.5 MG/3ML) 0.083% nebulizer solution Take 2.5 mg by nebulization every 6 (six) hours as needed for wheezing or shortness of breath.     Historical Provider, MD  ipratropium (ATROVENT) 0.02 % nebulizer solution Take 0.5 mg by nebulization every 6 (six) hours as needed for wheezing or shortness of breath.     Historical Provider, MD     Vital Signs: BP 130/55 mmHg  Pulse 67  Temp(Src) 97 F (36.1 C) (Oral)  Resp 13  Ht 5\' 7"  (1.702 m)  Wt 178 lb (80.74 kg)  BMI 27.87 kg/m2  SpO2 95%  Physical Exam General: A&Ox3, NAD, Extubated Neuro: Speech clear, smile and tongue symmetrical, LUE ataxia and 3/5 strength compared to RUE 5/5, LLE dorsiflexion weakness, plantar flexion intact and strong, RLE strength 5/5 Ext: RCFA access dressing C/D/I, soft, no signs of bleeding/hematoma  Labs:  CBC:  Recent Labs  01/18/15 1308  WBC 9.1  HGB 15.0  HCT 43.2  PLT 217    COAGS:  Recent Labs  01/18/15 1308  INR 0.95  APTT 31    BMP:  Recent Labs  11/24/14 1450 01/18/15 1308  NA 143 140  K 4.0 3.8  CL 103 105  CO2 25 26  GLUCOSE 146* 181*  BUN 18 12  CALCIUM 9.8 8.6  CREATININE 0.92 1.05  GFRNONAA 64 53*  GFRAA 74 62*    LIVER FUNCTION TESTS:  Recent Labs  01/18/15 1308  BILITOT 0.7  AST 19  ALT 23  ALKPHOS 94  PROT 6.4  ALBUMIN 3.9  Assessment and Plan: Left MCA aneurysm  S/p elective embolization with stent today, has been on ASA and plavix x 5 days, p2y12 on 3/7 is 113, on IV heparin gtt post procedure, BP wnl New LUE ataxia and intermittent left sided weakness starting at approximately 3pm per Neuro ICU RN Code stroke called, patient getting CT head now.   SignedHedy Jacob 01/20/2015, 4:48 PM

## 2015-01-20 NOTE — Anesthesia Postprocedure Evaluation (Signed)
  Anesthesia Post-op Note  Patient: Diane Hutchinson  Procedure(s) Performed: Procedure(s): RADIOLOGY WITH ANESTHESIA (N/A)  Patient Location: PACU  Anesthesia Type:General  Level of Consciousness: awake, alert , oriented and patient cooperative  Airway and Oxygen Therapy: Patient Spontanous Breathing  Post-op Pain: none  Post-op Assessment: Post-op Vital signs reviewed, Patient's Cardiovascular Status Stable, Respiratory Function Stable, Patent Airway, No signs of Nausea or vomiting and Pain level controlled  Post-op Vital Signs: stable  Last Vitals:  Filed Vitals:   01/20/15 1428  BP: 124/49  Pulse: 64  Temp:   Resp: 11    Complications: No apparent anesthesia complications

## 2015-01-20 NOTE — Progress Notes (Signed)
ANTICOAGULATION CONSULT NOTE -Follow up  Pharmacy Consult for Heparin Indication: Dr. Estanislado Pandy procedure s/p cerebral angiogram  Allergies  Allergen Reactions  . Codeine Other (See Comments)    dellusions  . Meperidine Hcl Other (See Comments)    Hurts stomach  . Morphine Other (See Comments)    dellusions  . Sulfonamide Derivatives Other (See Comments)    Drives her nuts    Vital Signs: Temp: 97 F (36.1 C) (03/09 1433) BP: 124/52 mmHg (03/09 2200) Pulse Rate: 70 (03/09 2200)  Labs:  Recent Labs  01/18/15 1308 01/20/15 2300  HGB 15.0  --   HCT 43.2  --   PLT 217  --   APTT 31  --   LABPROT 12.8  --   INR 0.95  --   HEPARINUNFRC  --  0.14*  CREATININE 1.05  --     Estimated Creatinine Clearance: 56 mL/min (by C-G formula based on Cr of 1.05).   Medical History: Past Medical History  Diagnosis Date  . Migraine   . Depression   . Hypertension   . Stroke   . COPD (chronic obstructive pulmonary disease)   . Anxiety     h/o of panic attack  . GERD (gastroesophageal reflux disease)     occas. use of TUMS  . Arthritis     knees    Assessment: 69 year old female on heparin s/p cerebral angiogram. On heparin until 7 am 01/21/15. Initial 9 hour heparin level = 0.14 on heparin IV drip 700 units/hr.  No bleeding reported. To continue on heparin until 7 am 01/21/15.  Goal of Therapy:  Heparin level 0.1-0.25 units/ml Monitor platelets by anticoagulation protocol: Yes   Plan:  Continue Heparin at 700 units /hr until 7 am 01/21/15. Follow up for dc at 7 am  Thank you. Nicole Cella, RPh Clinical Pharmacist Pager: [phone] 01/20/2015,11:52 PM

## 2015-01-20 NOTE — Sedation Documentation (Signed)
Sedation and monitoring per Anesthesia

## 2015-01-20 NOTE — Sedation Documentation (Signed)
Pt states she took ASA and Plavix this am

## 2015-01-20 NOTE — H&P (Signed)
Chief Complaint: "I am here for a procedure for my brain aneurysm.   Referring Physician(s): Deveshwar,Sanjeev  History of Present Illness: Diane Hutchinson is a 69 y.o. female with history of multiple intracranial aneurysms and c/o of worsening frontal headaches, she states her last headache was a couple days ago and denies any current headache. She denies any extremity weakness, difficulty with speech, diplopia, loss or vision or blurry vision. She does state that her gait is off balance to the left side at times. She denies any chest pain, shortness of breath or palpitations. She denies any active signs of bleeding or excessive bruising. She denies any recent fever or chills or urinary symptoms. The patient denies any chronic oxygen use. She has previously tolerated sedation without complications. She has been seen in consult with Dr. Estanislado Pandy on 01/07/15 and scheduled today for image guided cerebral arteriogram with endovascular treatment of left MCA aneurysm.   Past Medical History  Diagnosis Date  . Migraine   . Depression   . Hypertension   . Stroke   . COPD (chronic obstructive pulmonary disease)   . Anxiety     h/o of panic attack  . GERD (gastroesophageal reflux disease)     occas. use of TUMS  . Arthritis     knees    Past Surgical History  Procedure Laterality Date  . [place] surgery    . Cholecystectomy    . Appendectomy    . Eye surgery      laser to both eyes for blocked tear ducts   . Brain surgery  1993    aneurysm  . Tubal ligation    . Hemorroidectomy      Allergies: Codeine; Meperidine hcl; Morphine; and Sulfonamide derivatives  Medications: Prior to Admission medications   Medication Sig Start Date End Date Taking? Authorizing Provider  albuterol (PROVENTIL) (2.5 MG/3ML) 0.083% nebulizer solution Take 2.5 mg by nebulization every 6 (six) hours as needed for wheezing or shortness of breath.    Historical Provider, MD  alendronate (FOSAMAX) 70  MG tablet Take 70 mg by mouth every Monday. Take with a full glass of water on an empty stomach.    Historical Provider, MD  ALPRAZolam Duanne Moron) 1 MG tablet Take 1 mg by mouth 3 (three) times daily as needed for anxiety.    Historical Provider, MD  aspirin 81 MG tablet Take 324 mg by mouth once.    Historical Provider, MD  atorvastatin (LIPITOR) 40 MG tablet Take 40 mg by mouth daily.    Historical Provider, MD  Cholecalciferol (VITAMIN D PO) Take 1 tablet by mouth daily.    Historical Provider, MD  clopidogrel (PLAVIX) 75 MG tablet Take 75 mg by mouth daily.    Historical Provider, MD  gemfibrozil (LOPID) 600 MG tablet Take 600 mg by mouth 2 (two) times daily before a meal.    Historical Provider, MD  ipratropium (ATROVENT) 0.02 % nebulizer solution Take 0.5 mg by nebulization every 6 (six) hours as needed for wheezing or shortness of breath.     Historical Provider, MD  meloxicam (MOBIC) 15 MG tablet Take 15 mg by mouth daily.    Historical Provider, MD  metoprolol succinate (TOPROL XL) 25 MG 24 hr tablet Take 25 mg by mouth at bedtime.     Historical Provider, MD  Omega-3 Fatty Acids (FISH OIL PO) Take 1 capsule by mouth daily.    Historical Provider, MD  risperiDONE (RISPERDAL) 0.5 MG tablet Take 0.5 mg  by mouth at bedtime.    Historical Provider, MD  sertraline (ZOLOFT) 100 MG tablet Take 200 mg by mouth at bedtime.  11/16/14   Historical Provider, MD  traZODone (DESYREL) 150 MG tablet Take 300 mg by mouth at bedtime.    Historical Provider, MD  VITAMIN E PO Take 1 tablet by mouth daily.    Historical Provider, MD     Family History  Problem Relation Age of Onset  . Stroke Neg Hx     History   Social History  . Marital Status: Married    Spouse Name: N/A  . Number of Children: 1  . Years of Education: GED   Occupational History  . Retired     Social History Main Topics  . Smoking status: Current Every Day Smoker -- 1.00 packs/day for 55 years  . Smokeless tobacco: Never Used  .  Alcohol Use: No  . Drug Use: No  . Sexual Activity: Not on file   Other Topics Concern  . None   Social History Narrative   Patient lives at home with husband [name]   Patient has 1 child.    Patient has her GED   Patient is right handed.       Review of Systems: A 12 point ROS discussed and pertinent positives are indicated in the HPI above.  All other systems are negative.  Review of Systems  Vital Signs: There were no vitals taken for this visit.  Physical Exam  Constitutional: She is oriented to person, place, and time. No distress.  HENT:  Head: Normocephalic and atraumatic.  Neck: No tracheal deviation present.  Cardiovascular: Intact distal pulses.  Exam reveals no gallop and no friction rub.   No murmur heard. Pulmonary/Chest: Effort normal.  Diminished BS throughout, no W/R/R  Abdominal: Soft. Bowel sounds are normal. She exhibits no distension. There is no tenderness.  Neurological: She is alert and oriented to person, place, and time.  Speech clear, smile symmetrical, equal strength upper and lower extremities B/L, no ataxia  Skin: She is not diaphoretic.  Psychiatric: She has a normal mood and affect. Her behavior is normal. Thought content normal.    Mallampati Score:  MD Evaluation Airway: WNL Heart: WNL Abdomen: WNL Chest/ Lungs: WNL ASA  Classification: 3 Mallampati/Airway Score: Two  Imaging: Ir Radiologist Eval & Mgmt  01/11/2015   EXAM: ESTABLISHED PATIENT OFFICE VISIT  CHIEF COMPLAINT: Patient with intermittent headaches. Previous history of multiple intracranial aneurysms.  Current Pain Level: 1-10  HISTORY OF PRESENT ILLNESS: The patient is a 69 year old right-handed lady who is known to me.  The patient returns for follow-up with symptoms of headaches.  The patient was lost to follow-up. 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 At that time she was found have a left middle cerebral  artery region aneurysm as well.  Since then the patient has had a CT angiogram in April of 2015, and also recently in January of 2016. She was apparently referred to a neurologist for unknown reasons as per the patient.  The patient subsequently contacted [place] for follow-up.  She is accompanied by her husband.  Clinically the patient reports of continues headaches which apparently are getting worse. She also reports ntermittent difficulty with gait and her balance resulting in stumbling.  However, she denies having any seizures or loss of consciousness or altered mental status.  She denies any visual aberrations, diplopia, loss of vision, speech difficulties, comprehension difficulties,  nausea, vomiting or difficulty with coordination.  From the review of systems standpoint, the patient denies any abnormal pathologic symptomatology pertaining to her gastrointestinal, cardiovascular, respiratory or GU systems.  The patient reports no difficulty with her sleep.  She does report symptoms of anxiety and depression.  Past medical history of arthritis, depression, high cholesterol.  Previous surgeries on her nose, eyes and gallbladder.  Medications: Gemfibrozil. Alendronate. Sertraline. Atorvastatin. Meloxicam. Risperidone. Alprazolam. Trazodone.  Allergies to Demerol, codeine and sulfa.  Social history: The patient is married with one daughter healthy. Patient smokes half a pack a day, has done so for many years. Denies any use of alcohol or illicit chemicals.  Family history positive for headaches, heart problems, lung disease in mother. Mother also had lung carcinoma. Father died of an MI at age 56.  Also she reports a second cousin and another second cousin with brain aneurysms.  PHYSICAL EXAMINATION: On brief neurological examination the patient is alert, awake and oriented to time, place, space. No lateralizing intracranial abnormality seen.  Motor sensory coordination and speech are within normal limits.  The patient  is in no acute distress.  Affect appropriate and normal.  ASSESSMENT AND PLAN: The patient's most recent CT angiogram was reviewed and compared with her most recent catheter angiogram.  The CT angiograms are somewhat marred by the metallic artifact overlying the sites of interest.  No gross occlusions or stenosis are evident on the images provided.  Metallic artifact overlying the previously coiled right posterior-inferior cerebellar artery region aneurysm, and clips from the previously treated right middle cerebral artery region aneurysm precludes fine detail in these regions.  However, the left MCA trifurcation aneurysm is again noted with a bilobed configuration. It measures approximately 5 mm x 4.5 mm.  Again the natural history of unruptured intracranial aneurysms was reviewed with the patient.  With the patient having had two aneurysms already treated, and ruptured, the likelihood of increased risk of rupture was significant in the patient.  Options considered were those of continued surveillance with CT angiograms or MRI MRAs versus considerations for treatment of the aneurysm endovasculary or surgical clipping.  The patient would like to proceed with endovascular treatment of the left middle cerebral artery region aneurysm.  The procedure was reviewed with her and her husband. The risks and benefits were reviewed in detail.  Questions were answered to their satisfaction.  The patient will be scheduled for endovascular treatment of the left intracranial aneurysm.  She will be started on aspirin 325 mg and Plavix 75 mg a day 7 days prior to the procedure. She will undergo a P2Y12 study 2 days prior to the procedure should there be any adjustments to be made to her anti-platelet therapy.  In the meantime she has been asked to continue taking her present medications.  They were asked to call should they have any concerns or questions.   Electronically Signed   By: Luanne Bras M.D.   On: 01/08/2015 12:16      Labs:  CBC:  Recent Labs  01/18/15 1308  WBC 9.1  HGB 15.0  HCT 43.2  PLT 217    COAGS:  Recent Labs  01/18/15 1308  INR 0.95  APTT 31    BMP:  Recent Labs  11/24/14 1450 01/18/15 1308  NA 143 140  K 4.0 3.8  CL 103 105  CO2 25 26  GLUCOSE 146* 181*  BUN 18 12  CALCIUM 9.8 8.6  CREATININE 0.92 1.05  GFRNONAA 64 53*  GFRAA 74 62*    LIVER FUNCTION TESTS:  Recent Labs  01/18/15 1308  BILITOT 0.7  AST 19  ALT 23  ALKPHOS 94  PROT 6.4  ALBUMIN 3.9    Assessment and Plan: History of multiple intracranial aneurysms Worsening frontal headaches Seen in consult with Dr. Estanislado Pandy on 01/07/15  Scheduled today for image guided cerebral arteriogram with endovascular treatment of left MCA aneurysm. Patient has been NPO, no blood thinners taken, labs and vitals have been reviewed, on ASA 325mg  and Plavix 75 mg daily, P2Y12 113 01/18/15 Risks and Benefits discussed with the patient including, but not limited to bleeding, infection, vascular injury, or stroke. All of the patient's questions were answered, patient is agreeable to proceed. Consent signed and in chart.   SignedHedy Jacob 01/20/2015, 8:02 AM

## 2015-01-21 ENCOUNTER — Encounter (HOSPITAL_COMMUNITY): Payer: Self-pay | Admitting: Interventional Radiology

## 2015-01-21 DIAGNOSIS — I671 Cerebral aneurysm, nonruptured: Secondary | ICD-10-CM | POA: Diagnosis not present

## 2015-01-21 DIAGNOSIS — I6340 Cerebral infarction due to embolism of unspecified cerebral artery: Secondary | ICD-10-CM | POA: Insufficient documentation

## 2015-01-21 DIAGNOSIS — R270 Ataxia, unspecified: Secondary | ICD-10-CM

## 2015-01-21 LAB — URINE MICROSCOPIC-ADD ON

## 2015-01-21 LAB — CBC WITH DIFFERENTIAL/PLATELET
Basophils Absolute: 0 10*3/uL (ref 0.0–0.1)
Basophils Relative: 0 % (ref 0–1)
EOS ABS: 0 10*3/uL (ref 0.0–0.7)
EOS PCT: 0 % (ref 0–5)
HCT: 34.8 % — ABNORMAL LOW (ref 36.0–46.0)
HEMOGLOBIN: 11.9 g/dL — AB (ref 12.0–15.0)
LYMPHS ABS: 2.6 10*3/uL (ref 0.7–4.0)
Lymphocytes Relative: 22 % (ref 12–46)
MCH: 32.8 pg (ref 26.0–34.0)
MCHC: 34.2 g/dL (ref 30.0–36.0)
MCV: 95.9 fL (ref 78.0–100.0)
Monocytes Absolute: 0.9 10*3/uL (ref 0.1–1.0)
Monocytes Relative: 7 % (ref 3–12)
NEUTROS ABS: 8.6 10*3/uL — AB (ref 1.7–7.7)
Neutrophils Relative %: 71 % (ref 43–77)
Platelets: 187 10*3/uL (ref 150–400)
RBC: 3.63 MIL/uL — ABNORMAL LOW (ref 3.87–5.11)
RDW: 12.8 % (ref 11.5–15.5)
WBC: 12.1 10*3/uL — AB (ref 4.0–10.5)

## 2015-01-21 LAB — BASIC METABOLIC PANEL
ANION GAP: 4 — AB (ref 5–15)
BUN: 7 mg/dL (ref 6–23)
CALCIUM: 7.9 mg/dL — AB (ref 8.4–10.5)
CO2: 29 mmol/L (ref 19–32)
Chloride: 108 mmol/L (ref 96–112)
Creatinine, Ser: 0.69 mg/dL (ref 0.50–1.10)
GFR calc non Af Amer: 87 mL/min — ABNORMAL LOW (ref >90)
GLUCOSE: 105 mg/dL — AB (ref 70–99)
POTASSIUM: 3.8 mmol/L (ref 3.5–5.1)
Sodium: 141 mmol/L (ref 135–145)

## 2015-01-21 LAB — CBC
HEMATOCRIT: 37.3 % (ref 36.0–46.0)
HEMOGLOBIN: 12.7 g/dL (ref 12.0–15.0)
MCH: 33 pg (ref 26.0–34.0)
MCHC: 34 g/dL (ref 30.0–36.0)
MCV: 96.9 fL (ref 78.0–100.0)
PLATELETS: 189 10*3/uL (ref 150–400)
RBC: 3.85 MIL/uL — ABNORMAL LOW (ref 3.87–5.11)
RDW: 12.9 % (ref 11.5–15.5)
WBC: 12.3 10*3/uL — AB (ref 4.0–10.5)

## 2015-01-21 LAB — CREATININE, SERUM
CREATININE: 0.84 mg/dL (ref 0.50–1.10)
GFR calc Af Amer: 81 mL/min — ABNORMAL LOW (ref >90)
GFR, EST NON AFRICAN AMERICAN: 70 mL/min — AB (ref >90)

## 2015-01-21 LAB — URINALYSIS, ROUTINE W REFLEX MICROSCOPIC
Bilirubin Urine: NEGATIVE
Glucose, UA: NEGATIVE mg/dL
KETONES UR: NEGATIVE mg/dL
Leukocytes, UA: NEGATIVE
NITRITE: NEGATIVE
PH: 6.5 (ref 5.0–8.0)
PROTEIN: NEGATIVE mg/dL
Specific Gravity, Urine: 1.007 (ref 1.005–1.030)
Urobilinogen, UA: 0.2 mg/dL (ref 0.0–1.0)

## 2015-01-21 LAB — PLATELET INHIBITION P2Y12: PLATELET FUNCTION P2Y12: 212 [PRU] (ref 194–418)

## 2015-01-21 MED ORDER — OMEGA-3-ACID ETHYL ESTERS 1 G PO CAPS
1.0000 g | ORAL_CAPSULE | Freq: Every day | ORAL | Status: DC
Start: 2015-01-21 — End: 2015-01-22
  Administered 2015-01-21 – 2015-01-22 (×2): 1 g via ORAL
  Filled 2015-01-21 (×2): qty 1

## 2015-01-21 MED ORDER — SERTRALINE HCL 100 MG PO TABS: 100.0000 mg | ORAL_TABLET | Freq: Every day | ORAL | Status: DC

## 2015-01-21 MED ORDER — ALPRAZOLAM 0.5 MG PO TABS
1.0000 mg | ORAL_TABLET | Freq: Three times a day (TID) | ORAL | Status: DC | PRN
  Administered 2015-01-21 – 2015-01-22 (×4): 1 mg via ORAL
  Filled 2015-01-21 (×4): qty 2

## 2015-01-21 MED ORDER — ATORVASTATIN CALCIUM 40 MG PO TABS
40.0000 mg | ORAL_TABLET | Freq: Every day | ORAL | Status: DC
  Administered 2015-01-21: 40 mg via ORAL
  Filled 2015-01-21 (×2): qty 1

## 2015-01-21 MED ORDER — PHENOL 1.4 % MT LIQD
1.0000 | OROMUCOSAL | Status: DC | PRN
  Filled 2015-01-21: qty 177

## 2015-01-21 MED ORDER — SERTRALINE HCL 100 MG PO TABS
100.0000 mg | ORAL_TABLET | Freq: Every day | ORAL | Status: DC
  Administered 2015-01-21: 100 mg via ORAL
  Filled 2015-01-21 (×2): qty 1

## 2015-01-21 MED ORDER — CLOPIDOGREL BISULFATE 75 MG PO TABS
75.0000 mg | ORAL_TABLET | Freq: Once | ORAL | Status: AC
  Administered 2015-01-21: 75 mg via ORAL
  Filled 2015-01-21: qty 1

## 2015-01-21 MED ORDER — ENOXAPARIN SODIUM 40 MG/0.4ML ~~LOC~~ SOLN
40.0000 mg | SUBCUTANEOUS | Status: DC
Start: 2015-01-21 — End: 2015-01-22
  Administered 2015-01-21 – 2015-01-22 (×2): 40 mg via SUBCUTANEOUS
  Filled 2015-01-21 (×2): qty 0.4

## 2015-01-21 MED ORDER — RISPERIDONE 0.5 MG PO TABS
0.5000 mg | ORAL_TABLET | Freq: Every day | ORAL | Status: DC
  Administered 2015-01-21: 0.5 mg via ORAL
  Filled 2015-01-21 (×3): qty 1

## 2015-01-21 MED ORDER — TRAZODONE HCL 150 MG PO TABS
150.0000 mg | ORAL_TABLET | Freq: Every day | ORAL | Status: DC
  Administered 2015-01-21: 150 mg via ORAL
  Filled 2015-01-21 (×2): qty 1

## 2015-01-21 NOTE — Progress Notes (Signed)
Patient having panic attacks, very anxious and restless saying her skin is crawling. States she just wants to go home and she wants her medicine she takes at night. No medications ordered for bedtime. Paged neuro, orders received to restart her home dosages of trazodone, risperidone, and zoloft. Will continue to monitor.

## 2015-01-21 NOTE — Evaluation (Signed)
Physical Therapy Evaluation Patient Details Name: Diane Hutchinson MRN: [id number] DOB: 1946/08/02 Today's Date: 01/21/2015   History of Present Illness  pt presents post L MCA Aneurysm revascularization.    Clinical Impression  Pt very eager to work on mobility and return to independence, however does admit to being scared during mobility since she knows her L side is affected.  Pt neglectful of L UE > LE and with diminished sensation and proprioception.  At this time feel pt would benefit from CIR at D/C to maximize independence and decrease burden of care prior to returning to home.  Will continue to follow.      Follow Up Recommendations CIR    Equipment Recommendations  None recommended by PT    Recommendations for Other Services Rehab consult     Precautions / Restrictions Precautions Precautions: Fall Restrictions Weight Bearing Restrictions: No      Mobility  Bed Mobility Overal bed mobility: Needs Assistance Bed Mobility: Supine to Sit     Supine to sit: Mod assist     General bed mobility comments: cues for step-by-step sequencing, attending to L side, and encouragement.  pt tends to neglect L UE > LE.    Transfers Overall transfer level: Needs assistance Equipment used: 2 person hand held assist Transfers: Sit to/from [facility] Sit to Stand: Mod assist Stand pivot transfers: Max assist;+2 physical assistance       General transfer comment: pt able to come to stand with one person A and use of R UE on armrest of recliner.  pt with por awareness of L side and poor proprioception as pt's L foot tends to supinate.  cues and facilitation for trunk/hip extension into standing and to complete pivot.    Ambulation/Gait                Stairs            Wheelchair Mobility    Modified Rankin (Stroke Patients Only) Modified Rankin (Stroke Patients Only) Pre-Morbid Rankin Score: No symptoms Modified Rankin: Severe  disability     Balance Overall balance assessment: Needs assistance Sitting-balance support: Single extremity supported;Feet supported Sitting balance-Leahy Scale: Fair Sitting balance - Comments: pt able to maintain balance without UE support when focused on task of sitting, however when distracted pt needs single UE support or MinA to maintain sitting balance.     Standing balance support: During functional activity Standing balance-Leahy Scale: Poor                               Pertinent Vitals/Pain Pain Assessment: No/denies pain    Home Living Family/patient expects to be discharged to:: Inpatient rehab Living Arrangements: Spouse/significant other                    Prior Function Level of Independence: Independent               Hand Dominance   Dominant Hand: Right    Extremity/Trunk Assessment   Upper Extremity Assessment: Defer to OT evaluation           Lower Extremity Assessment: LLE deficits/detail   LLE Deficits / Details: Strength grossly 4-/5, decreased coordination, ataxic, and decresed sensation to soft touch and proprioception.  Of note pt is hypersensitive to painful stimuli.    Cervical / Trunk Assessment: Normal  Communication   Communication: No difficulties  Cognition Arousal/Alertness: Awake/alert Behavior During Therapy: [facility]  for tasks assessed/performed Overall Cognitive Status: Within Functional Limits for tasks assessed                      General Comments      Exercises        Assessment/Plan    PT Assessment Patient needs continued PT services  PT Diagnosis Difficulty walking;Hemiplegia non-dominant side   PT Problem List Decreased strength;Decreased activity tolerance;Decreased balance;Decreased mobility;Decreased coordination;Decreased knowledge of use of DME;Impaired sensation  PT Treatment Interventions DME instruction;Gait training;Functional mobility training;Therapeutic  activities;Therapeutic exercise;Balance training;Neuromuscular re-education;Patient/family education   PT Goals (Current goals can be found in the Care Plan section) Acute Rehab PT Goals Patient Stated Goal: Back to normal. PT Goal Formulation: With patient Time For Goal Achievement: 02/04/15 Potential to Achieve Goals: Good    Frequency Min 4X/week   Barriers to discharge        Co-evaluation               End of Session Equipment Utilized During Treatment: Gait belt;Oxygen Activity Tolerance: Patient tolerated treatment well Patient left: in chair;with call bell/phone within reach Nurse Communication: Mobility status         Time: 6045-4098 PT Time Calculation (min) (ACUTE ONLY): 27 min   Charges:   PT Evaluation $Initial PT Evaluation Tier I: 1 Procedure PT Treatments $Therapeutic Activity: 8-22 mins   PT G CodesCatarina Hartshorn, [place] 01/21/2015, 12:08 PM

## 2015-01-21 NOTE — Progress Notes (Signed)
Received prescreen request for inpatient rehab from PT and noted that rehab consult has already been placed. Pamala Hurry, rehab admission coordinator, will follow up after rehab consult completed. Thanks.  Nanetta Batty, PT Rehabilitation Admissions Coordinator [phone]

## 2015-01-21 NOTE — Progress Notes (Signed)
UR completed.  Annsley Akkerman, RN BSN MHA CCM Trauma/Neuro ICU Case Manager [phone]  

## 2015-01-21 NOTE — Progress Notes (Signed)
STROKE TEAM PROGRESS NOTE   HISTORY Diane Hutchinson is an 69 y.o. female who under went a Left MCA elective embolization using stent assistance today 01/20/2015. Post procedure she was noted to have left arm tingling and ataxia of left arm and leg. Code stroke was called at 1500 (LKW). Patient was brought to CT which showed no acute stroke. CTA was then performed which again showed no large vessel occlusion. Patient was not a tPA candidate secondary being on Heparin drip and elevated APTT. She was admitted for further evaluation and treatment.   SUBJECTIVE (INTERVAL HISTORY) Her husband is at the bedside.  Overall she feels her condition is gradually improving. She has a prior history of right frontal craniotomy with aneurysm clipping in 1990s as well as her right posterior communicating aneurysm coiling and yesterday's left MCA aneurysm stent assisted treatment   OBJECTIVE Temp:  [96.8 F (36 C)-98.7 F (37.1 C)] 98.7 F (37.1 C) (03/10 0800) Pulse Rate:  [56-72] 62 (03/10 0800) Cardiac Rhythm:  [-] Normal sinus rhythm (03/10 0400) Resp:  [11-33] 22 (03/10 0800) BP: (105-149)/(49-107) 119/77 mmHg (03/10 0800) SpO2:  [92 %-98 %] 97 % (03/10 0800) Arterial Line BP: (125-189)/(51-81) 148/60 mmHg (03/10 0800)  No results for input(s): GLUCAP in the last 168 hours.  Recent Labs Lab 01/18/15 1308 01/21/15 0505  NA 140 141  K 3.8 3.8  CL 105 108  CO2 26 29  GLUCOSE 181* 105*  BUN 12 7  CREATININE 1.05 0.69  CALCIUM 8.6 7.9*    Recent Labs Lab 01/18/15 1308  AST 19  ALT 23  ALKPHOS 94  BILITOT 0.7  PROT 6.4  ALBUMIN 3.9    Recent Labs Lab 01/18/15 1308 01/21/15 0505  WBC 9.1 12.1*  NEUTROABS 5.1 8.6*  HGB 15.0 11.9*  HCT 43.2 34.8*  MCV 95.8 95.9  PLT 217 187   No results for input(s): CKTOTAL, CKMB, CKMBINDEX, TROPONINI in the last 168 hours.  Recent Labs  01/18/15 1308  LABPROT 12.8  INR 0.95   No results for input(s): COLORURINE, LABSPEC,  PHURINE, GLUCOSEU, HGBUR, BILIRUBINUR, KETONESUR, PROTEINUR, UROBILINOGEN, NITRITE, LEUKOCYTESUR in the last 72 hours.  Invalid input(s): APPERANCEUR  No results found for: CHOL, TRIG, HDL, CHOLHDL, VLDL, LDLCALC No results found for: HGBA1C No results found for: LABOPIA, COCAINSCRNUR, LABBENZ, AMPHETMU, THCU, LABBARB  No results for input(s): ETH in the last 168 hours.  Ct Angio Head W/cm &/or Wo Cm  01/20/2015   CLINICAL DATA:  Pipeline stent placed for left MCA aneurysm. Postoperative complaints of left arm and leg weakness.  EXAM: CT ANGIOGRAPHY HEAD AND NECK  TECHNIQUE: Multidetector CT imaging of the head and neck was performed using the standard protocol during bolus administration of intravenous contrast. Multiplanar CT image reconstructions and MIPs were obtained to evaluate the vascular anatomy. Carotid stenosis measurements (when applicable) are obtained utilizing NASCET criteria, using the distal internal carotid diameter as the denominator.  CONTRAST:  59mL OMNIPAQUE IOHEXOL 350 MG/ML SOLN  COMPARISON:  Angiography same day.  CTA 12/03/2014.  FINDINGS: CT HEAD  Brain: Previous core allowing upper right vertebral aneurysm. Previous clipping of the right MCA aneurysm. Placement of pipeline stent in the left MCA region today. No evidence of intracranial hemorrhage. 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 No sign of acute infarction by CT.  Calvarium and skull base: Postoperative changes.  Nothing a acute.  Paranasal sinuses: Clear  CTA NECK  Aortic arch: There is advanced atherosclerotic disease of the arch  with calcified plaque in either soft plaque or thrombus along the inferior margin of the arch. 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 Branching pattern of brachiocephalic vessels from the arch is normal without origin stenosis.  Right carotid system: Right common carotid artery widely patent to the bifurcation. Ordinary  mild atherosclerotic disease at the carotid bifurcation without stenosis or irregularity. Cervical internal carotid artery widely patent.  Left carotid system: Left common carotid artery affected by diffuse atherosclerotic disease. Narrowing in the proximal portion of 40%. Vessel patent to the bifurcation with mild atherosclerotic change at the bifurcation but no stenosis or irregularity.  Vertebral arteries:Right vertebral artery dominant. Both vertebral artery origins are widely patent. Both vessels are patent through the cervical region to the foramen magnum.  Skeleton: Ordinary cervical spondylosis  Other neck: No significant soft tissue lesion.  CTA HEAD  Anterior circulation: Both carotid siphon regions widely patent. On the right, the anterior and middle cerebral vessels are patent. There is artifact related to previous MCA aneurysm clipping. I do not see any missing vessels on the right compared to the previous study of 12/03/2014. On the left, pipeline stent is in place beginning at the M1 segment and extending into 1 of the left MCA branches. This spans the location of the fusiform aneurysm of the MCA branch, which measures 2.6 x 5.4 mm. No missing branch vessels are seen on the left. One could question if there is mild spasm of the MCA branch of medially beyond the and of the stent. Beyond that area, the vessel appears normal as it did before.  Posterior circulation: The right vertebral artery is a large vessel widely patent to the basilar. 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 The left vertebral artery is a small vessel that largely terminates in PICA. There is not definitely any flow beyond PICA to the basilar. The basilar artery shows mild atherosclerotic irregularity but no flow-limiting stenosis. Superior cerebellar and posterior cerebral vessels are patent and normal.  Venous sinuses: Patent and normal.  Anatomic variants: No insignificant  Delayed phase: No  significant finding  IMPRESSION: Pipeline stent placed in the left MCA spanning a left MCA aneurysm. No complications seen relative to that. No missing vessels. No hemorrhage. One could question mild spasm of the MCA branch just beyond the end of the stent, but the vessel is widely patent beyond that and appears as it did before.  Previously clipped right MCA region aneurysm. No missing vessels demonstrated on the right compared to the study of 12/03/2014.  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   Electronically Signed   By: Nelson Chimes M.D.   On: 01/20/2015 17:50   Ct Head Wo Contrast  01/20/2015   CLINICAL DATA:  Pipeline stent placed for left MCA aneurysm. Postoperative complaints of left arm and leg weakness.  EXAM: CT ANGIOGRAPHY HEAD AND NECK  TECHNIQUE: Multidetector CT imaging of the head and neck was performed using the standard protocol during bolus administration of intravenous contrast. Multiplanar CT image reconstructions and MIPs were obtained to evaluate the vascular anatomy. Carotid stenosis measurements (when applicable) are obtained utilizing NASCET criteria, using the distal internal carotid diameter as the denominator.  CONTRAST:  110mL OMNIPAQUE IOHEXOL 350 MG/ML SOLN  COMPARISON:  Angiography same day.  CTA 12/03/2014.  FINDINGS: CT HEAD  Brain: Previous core allowing upper right vertebral aneurysm. Previous clipping of the right MCA aneurysm. Placement  of pipeline stent in the left MCA region today. No evidence of intracranial hemorrhage. 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 No sign of acute infarction by CT.  Calvarium and skull base: Postoperative changes.  Nothing a acute.  Paranasal sinuses: Clear  CTA NECK  Aortic arch: There is advanced atherosclerotic disease of the arch with calcified plaque in either soft plaque or thrombus along the inferior margin of the arch. Shallow laterally projecting pseudo aneurysm of the arch,  wide-mouth at 19 mm and depth of 9 mm. Branching pattern of brachiocephalic vessels from the arch is normal without origin stenosis.  Right carotid system: Right common carotid artery widely patent to the bifurcation. Ordinary mild atherosclerotic disease at the carotid bifurcation without stenosis or irregularity. Cervical internal carotid artery widely patent.  Left carotid system: Left common carotid artery affected by diffuse atherosclerotic disease. Narrowing in the proximal portion of 40%. Vessel patent to the bifurcation with mild atherosclerotic change at the bifurcation but no stenosis or irregularity.  Vertebral arteries:Right vertebral artery dominant. Both vertebral artery origins are widely patent. Both vessels are patent through the cervical region to the foramen magnum.  Skeleton: Ordinary cervical spondylosis  Other neck: No significant soft tissue lesion.  CTA HEAD  Anterior circulation: Both carotid siphon regions widely patent. On the right, the anterior and middle cerebral vessels are patent. There is artifact related to previous MCA aneurysm clipping. I do not see any missing vessels on the right compared to the previous study of 12/03/2014. On the left, pipeline stent is in place beginning at the M1 segment and extending into 1 of the left MCA branches. This spans the location of the fusiform aneurysm of the MCA branch, which measures 2.6 x 5.4 mm. No missing branch vessels are seen on the left. One could question if there is mild spasm of the MCA branch of medially beyond the and of the stent. Beyond that area, the vessel appears normal as it did before.  Posterior circulation: The right vertebral artery is a large vessel widely patent to the basilar. 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 The left vertebral artery is a small vessel that largely terminates in PICA. There is not definitely any flow beyond PICA to the basilar. The basilar artery shows mild  atherosclerotic irregularity but no flow-limiting stenosis. Superior cerebellar and posterior cerebral vessels are patent and normal.  Venous sinuses: Patent and normal.  Anatomic variants: No insignificant  Delayed phase: No significant finding  IMPRESSION: Pipeline stent placed in the left MCA spanning a left MCA aneurysm. No complications seen relative to that. No missing vessels. No hemorrhage. One could question mild spasm of the MCA branch just beyond the end of the stent, but the vessel is widely patent beyond that and appears as it did before.  Previously clipped right MCA region aneurysm. No missing vessels demonstrated on the right compared to the study of 12/03/2014.  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   Electronically Signed   By: Nelson Chimes M.D.   On: 01/20/2015 17:50   Ct Angio Neck W/cm &/or Wo/cm  01/20/2015   CLINICAL DATA:  Pipeline stent placed for left MCA aneurysm. Postoperative complaints of left arm and leg weakness.  EXAM: CT ANGIOGRAPHY HEAD AND NECK  TECHNIQUE: Multidetector CT imaging of the head and neck was performed using the standard protocol during bolus administration of intravenous contrast. Multiplanar CT image reconstructions and MIPs were obtained to evaluate  the vascular anatomy. Carotid stenosis measurements (when applicable) are obtained utilizing NASCET criteria, using the distal internal carotid diameter as the denominator.  CONTRAST:  78mL OMNIPAQUE IOHEXOL 350 MG/ML SOLN  COMPARISON:  Angiography same day.  CTA 12/03/2014.  FINDINGS: CT HEAD  Brain: Previous core allowing upper right vertebral aneurysm. Previous clipping of the right MCA aneurysm. Placement of pipeline stent in the left MCA region today. No evidence of intracranial hemorrhage. 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 No sign of acute infarction by CT.  Calvarium and skull base: Postoperative changes.  Nothing a acute.  Paranasal sinuses:  Clear  CTA NECK  Aortic arch: There is advanced atherosclerotic disease of the arch with calcified plaque in either soft plaque or thrombus along the inferior margin of the arch. 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 Branching pattern of brachiocephalic vessels from the arch is normal without origin stenosis.  Right carotid system: Right common carotid artery widely patent to the bifurcation. Ordinary mild atherosclerotic disease at the carotid bifurcation without stenosis or irregularity. Cervical internal carotid artery widely patent.  Left carotid system: Left common carotid artery affected by diffuse atherosclerotic disease. Narrowing in the proximal portion of 40%. Vessel patent to the bifurcation with mild atherosclerotic change at the bifurcation but no stenosis or irregularity.  Vertebral arteries:Right vertebral artery dominant. Both vertebral artery origins are widely patent. Both vessels are patent through the cervical region to the foramen magnum.  Skeleton: Ordinary cervical spondylosis  Other neck: No significant soft tissue lesion.  CTA HEAD  Anterior circulation: Both carotid siphon regions widely patent. On the right, the anterior and middle cerebral vessels are patent. There is artifact related to previous MCA aneurysm clipping. I do not see any missing vessels on the right compared to the previous study of 12/03/2014. On the left, pipeline stent is in place beginning at the M1 segment and extending into 1 of the left MCA branches. This spans the location of the fusiform aneurysm of the MCA branch, which measures 2.6 x 5.4 mm. No missing branch vessels are seen on the left. One could question if there is mild spasm of the MCA branch of medially beyond the and of the stent. Beyond that area, the vessel appears normal as it did before.  Posterior circulation: The right vertebral artery is a large vessel widely patent to the basilar.  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 The left vertebral artery is a small vessel that largely terminates in PICA. There is not definitely any flow beyond PICA to the basilar. The basilar artery shows mild atherosclerotic irregularity but no flow-limiting stenosis. Superior cerebellar and posterior cerebral vessels are patent and normal.  Venous sinuses: Patent and normal.  Anatomic variants: No insignificant  Delayed phase: No significant finding  IMPRESSION: Pipeline stent placed in the left MCA spanning a left MCA aneurysm. No complications seen relative to that. No missing vessels. No hemorrhage. One could question mild spasm of the MCA branch just beyond the end of the stent, but the vessel is widely patent beyond that and appears as it did before.  Previously clipped right MCA region aneurysm. No missing vessels demonstrated on the right compared to the study of 12/03/2014.  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   Electronically Signed   By: Nelson Chimes M.D.   On: 01/20/2015 17:50     PHYSICAL EXAM Pleasant middle  aged obese Caucasian lady not in distress. . Afebrile. Head is nontraumatic. Neck is supple without bruit.    Cardiac exam no murmur or gallop. Lungs are clear to auscultation. Distal pulses are well felt. Neurological Exam ;  Awake alert oriented x 3 normal speech and language. Mild left lower face asymmetry. Tongue midline. LUE drift.No LLE drift. Mild diminished fine finger movements on left and significant left grip weakness.. Orbits right over left upper extremity.  .Diminished left hemibody  sensation .Impaired left finger to nose coordination. ASSESSMENT/PLAN Diane Hutchinson is a 69 y.o. female with history of multiple intracranial aneurysms with c/o of worsening frontal headaches who developed left arm and leg ataxia post L MCA elective embolization with pipeline stent. She did not receive  IV t-PA due to heparin drip during cath.   Stroke:  Non-dominant right brain infarct, embolic secondary to complication of  Cerebral catheter angiogram during interventional neuroradiology aneurysm embolization Hx R MCA aneurysmal clipping 1993 Hx R PICA aneurysmal coiling s/p rupture 2010  Resultant  left hemiparesis  Repeat CT in am  2D Echo  pending   HgbA1c pending  SCDs for VTE prophylaxis  Diet clear liquid thin liquids  aspirin 81 mg orally every day and clopidogrel 75 mg orally every day prior to admission, now on aspirin 325 mg orally every day and clopidogrel 75 mg orally every day   Patient followed by Dr. Jaynee Eagles as an OP  Therapy recommendations:  pending   Disposition:  pending   Hypertension  Stable  Hyperlipidemia  Home meds:  lipitor 40 & omega 3. Will resume in hospital  LDL pending, goal < 70  Continue statin at discharge  Other Stroke Risk Factors  Advanced age  Cigarette smoker, advised to stop smoking  Migraines  Other Active Problems  Depression  COPD  GERD  Hospital day # 1  I have personally examined this patient, reviewed notes, independently viewed imaging studies, participated in medical decision making and plan of care. I have made any additions or clarifications directly to the above note. Agree with note above. She likely has a right brain subcortical infarct secondary to cerebral catheter angiogram. The infarct is not in the area which underwent stenting. She remains at risk for recurrent strokes, neurological worsening and needs ongoing stroke evaluation and aggressive risk factor modification. Continue aspirin and Plavix given recent cardiac stent and discontinue heparin. Mobilize out of bed. Therapy consults. Will likely need inpatient rehabilitation. Discuss with patient, family members and Dr. Estanislado Pandy and answered questions  Antony Contras, MD Medical Director [place] Pager: [phone] 01/21/2015 3:25  PM     To contact Stroke Continuity provider, please refer to [email or web address] After hours, contact General Neurology

## 2015-01-21 NOTE — Plan of Care (Signed)
Problem: Phase I Progression Outcomes Goal: Voiding-avoid urinary catheter unless indicated Outcome: Completed/Met Date Met:  01/21/15 Foley removed 3/10 morning

## 2015-01-21 NOTE — Progress Notes (Signed)
Physical medicine rehabilitation consult requested chart reviewed. Will await formal physical and occupational therapy evaluation and follow-up with formal rehabilitation consult recommendations at that time

## 2015-01-21 NOTE — Progress Notes (Signed)
Subjective: Patient is s/p Left MCA elective embolization using stent assistance 3/9, extubated and c/o left sided tingling and weakness approximately 3pm on 3/9. She has continued Left sided weakness and ataxia with loss of sensation on the left side today.  Allergies: Codeine; Meperidine hcl; Morphine; and Sulfonamide derivatives  Medications: Prior to Admission medications   Medication Sig Start Date End Date Taking? Authorizing Provider  alendronate (FOSAMAX) 70 MG tablet Take 70 mg by mouth every Monday. Take with a full glass of water on an empty stomach.   Yes Historical Provider, MD  ALPRAZolam Duanne Moron) 1 MG tablet Take 1 mg by mouth 3 (three) times daily as needed for anxiety.   Yes Historical Provider, MD  aspirin 81 MG tablet Take 324 mg by mouth once.   Yes Historical Provider, MD  atorvastatin (LIPITOR) 40 MG tablet Take 40 mg by mouth daily.   Yes Historical Provider, MD  Cholecalciferol (VITAMIN D PO) Take 1 tablet by mouth daily.   Yes Historical Provider, MD  clopidogrel (PLAVIX) 75 MG tablet Take 75 mg by mouth daily.   Yes Historical Provider, MD  gemfibrozil (LOPID) 600 MG tablet Take 600 mg by mouth 2 (two) times daily before a meal.   Yes Historical Provider, MD  meloxicam (MOBIC) 15 MG tablet Take 15 mg by mouth daily.   Yes Historical Provider, MD  metoprolol succinate (TOPROL XL) 25 MG 24 hr tablet Take 25 mg by mouth at bedtime.    Yes Historical Provider, MD  Omega-3 Fatty Acids (FISH OIL PO) Take 1 capsule by mouth daily.   Yes Historical Provider, MD  risperiDONE (RISPERDAL) 0.5 MG tablet Take 0.5 mg by mouth at bedtime.   Yes Historical Provider, MD  sertraline (ZOLOFT) 100 MG tablet Take 200 mg by mouth at bedtime.  11/16/14  Yes Historical Provider, MD  traZODone (DESYREL) 150 MG tablet Take 300 mg by mouth at bedtime.   Yes Historical Provider, MD  VITAMIN E PO Take 1 tablet by mouth daily.   Yes Historical Provider, MD  albuterol (PROVENTIL) (2.5 MG/3ML)  0.083% nebulizer solution Take 2.5 mg by nebulization every 6 (six) hours as needed for wheezing or shortness of breath.    Historical Provider, MD  ipratropium (ATROVENT) 0.02 % nebulizer solution Take 0.5 mg by nebulization every 6 (six) hours as needed for wheezing or shortness of breath.     Historical Provider, MD     Vital Signs: BP 128/59 mmHg  Pulse 63  Temp(Src) 98.7 F (37.1 C) (Oral)  Resp 14  Ht 5\' 7"  (1.702 m)  Wt 178 lb (80.74 kg)  BMI 27.87 kg/m2  SpO2 96%  Physical Exam General: A&Ox3, NAD, Extubated Neuro: Speech clear- no aphasia, EOMI, shoulder shrug intact and strong against resistance, smile and tongue symmetrical-may have slight left sided asymmetry, LUE ataxia and 3/5 strength compared to RUE 5/5, LLE dorsiflexion weakness, plantar flexion intact and strong, RLE strength 5/5, left sided sensation decreased Ext: RCFA access dressing C/D/I, soft, no signs of bleeding/hematoma  Imaging: Ct Angio Head W/cm &/or Wo Cm  01/20/2015   CLINICAL DATA:  Pipeline stent placed for left MCA aneurysm. Postoperative complaints of left arm and leg weakness.  EXAM: CT ANGIOGRAPHY HEAD AND NECK  TECHNIQUE: Multidetector CT imaging of the head and neck was performed using the standard protocol during bolus administration of intravenous contrast. Multiplanar CT image reconstructions and MIPs were obtained to evaluate the vascular anatomy. Carotid stenosis measurements (when applicable) are obtained utilizing  NASCET criteria, using the distal internal carotid diameter as the denominator.  CONTRAST:  23mL OMNIPAQUE IOHEXOL 350 MG/ML SOLN  COMPARISON:  Angiography same day.  CTA 12/03/2014.  FINDINGS: CT HEAD  Brain: Previous core allowing upper right vertebral aneurysm. Previous clipping of the right MCA aneurysm. Placement of pipeline stent in the left MCA region today. No evidence of intracranial hemorrhage. 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 No sign of acute infarction by  CT.  Calvarium and skull base: Postoperative changes.  Nothing a acute.  Paranasal sinuses: Clear  CTA NECK  Aortic arch: There is advanced atherosclerotic disease of the arch with calcified plaque in either soft plaque or thrombus along the inferior margin of the arch. 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 Branching pattern of brachiocephalic vessels from the arch is normal without origin stenosis.  Right carotid system: Right common carotid artery widely patent to the bifurcation. Ordinary mild atherosclerotic disease at the carotid bifurcation without stenosis or irregularity. Cervical internal carotid artery widely patent.  Left carotid system: Left common carotid artery affected by diffuse atherosclerotic disease. Narrowing in the proximal portion of 40%. Vessel patent to the bifurcation with mild atherosclerotic change at the bifurcation but no stenosis or irregularity.  Vertebral arteries:Right vertebral artery dominant. Both vertebral artery origins are widely patent. Both vessels are patent through the cervical region to the foramen magnum.  Skeleton: Ordinary cervical spondylosis  Other neck: No significant soft tissue lesion.  CTA HEAD  Anterior circulation: Both carotid siphon regions widely patent. On the right, the anterior and middle cerebral vessels are patent. There is artifact related to previous MCA aneurysm clipping. I do not see any missing vessels on the right compared to the previous study of 12/03/2014. On the left, pipeline stent is in place beginning at the M1 segment and extending into 1 of the left MCA branches. This spans the location of the fusiform aneurysm of the MCA branch, which measures 2.6 x 5.4 mm. No missing branch vessels are seen on the left. One could question if there is mild spasm of the MCA branch of medially beyond the and of the stent. Beyond that area, the vessel appears normal as it did before.  Posterior  circulation: The right vertebral artery is a large vessel widely patent to the basilar. 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 The left vertebral artery is a small vessel that largely terminates in PICA. There is not definitely any flow beyond PICA to the basilar. The basilar artery shows mild atherosclerotic irregularity but no flow-limiting stenosis. Superior cerebellar and posterior cerebral vessels are patent and normal.  Venous sinuses: Patent and normal.  Anatomic variants: No insignificant  Delayed phase: No significant finding  IMPRESSION: Pipeline stent placed in the left MCA spanning a left MCA aneurysm. No complications seen relative to that. No missing vessels. No hemorrhage. One could question mild spasm of the MCA branch just beyond the end of the stent, but the vessel is widely patent beyond that and appears as it did before.  Previously clipped right MCA region aneurysm. No missing vessels demonstrated on the right compared to the study of 12/03/2014.  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   Electronically Signed   By: Nelson Chimes M.D.   On: 01/20/2015 17:50   Ct Head Wo Contrast  01/20/2015   CLINICAL DATA:  Pipeline stent placed for left MCA  aneurysm. Postoperative complaints of left arm and leg weakness.  EXAM: CT ANGIOGRAPHY HEAD AND NECK  TECHNIQUE: Multidetector CT imaging of the head and neck was performed using the standard protocol during bolus administration of intravenous contrast. Multiplanar CT image reconstructions and MIPs were obtained to evaluate the vascular anatomy. Carotid stenosis measurements (when applicable) are obtained utilizing NASCET criteria, using the distal internal carotid diameter as the denominator.  CONTRAST:  53mL OMNIPAQUE IOHEXOL 350 MG/ML SOLN  COMPARISON:  Angiography same day.  CTA 12/03/2014.  FINDINGS: CT HEAD  Brain: Previous core allowing upper right vertebral aneurysm.  Previous clipping of the right MCA aneurysm. Placement of pipeline stent in the left MCA region today. No evidence of intracranial hemorrhage. 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 No sign of acute infarction by CT.  Calvarium and skull base: Postoperative changes.  Nothing a acute.  Paranasal sinuses: Clear  CTA NECK  Aortic arch: There is advanced atherosclerotic disease of the arch with calcified plaque in either soft plaque or thrombus along the inferior margin of the arch. 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 Branching pattern of brachiocephalic vessels from the arch is normal without origin stenosis.  Right carotid system: Right common carotid artery widely patent to the bifurcation. Ordinary mild atherosclerotic disease at the carotid bifurcation without stenosis or irregularity. Cervical internal carotid artery widely patent.  Left carotid system: Left common carotid artery affected by diffuse atherosclerotic disease. Narrowing in the proximal portion of 40%. Vessel patent to the bifurcation with mild atherosclerotic change at the bifurcation but no stenosis or irregularity.  Vertebral arteries:Right vertebral artery dominant. Both vertebral artery origins are widely patent. Both vessels are patent through the cervical region to the foramen magnum.  Skeleton: Ordinary cervical spondylosis  Other neck: No significant soft tissue lesion.  CTA HEAD  Anterior circulation: Both carotid siphon regions widely patent. On the right, the anterior and middle cerebral vessels are patent. There is artifact related to previous MCA aneurysm clipping. I do not see any missing vessels on the right compared to the previous study of 12/03/2014. On the left, pipeline stent is in place beginning at the M1 segment and extending into 1 of the left MCA branches. This spans the location of the fusiform aneurysm of the MCA branch, which measures 2.6 x 5.4  mm. No missing branch vessels are seen on the left. One could question if there is mild spasm of the MCA branch of medially beyond the and of the stent. Beyond that area, the vessel appears normal as it did before.  Posterior circulation: The right vertebral artery is a large vessel widely patent to the basilar. 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 The left vertebral artery is a small vessel that largely terminates in PICA. There is not definitely any flow beyond PICA to the basilar. The basilar artery shows mild atherosclerotic irregularity but no flow-limiting stenosis. Superior cerebellar and posterior cerebral vessels are patent and normal.  Venous sinuses: Patent and normal.  Anatomic variants: No insignificant  Delayed phase: No significant finding  IMPRESSION: Pipeline stent placed in the left MCA spanning a left MCA aneurysm. No complications seen relative to that. No missing vessels. No hemorrhage. One could question mild spasm of the MCA branch just beyond the end of the stent, but the vessel is widely patent beyond that and appears as it did before.  Previously clipped right MCA region aneurysm.  No missing vessels demonstrated on the right compared to the study of 12/03/2014.  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   Electronically Signed   By: Nelson Chimes M.D.   On: 01/20/2015 17:50   Ct Angio Neck W/cm &/or Wo/cm  01/20/2015   CLINICAL DATA:  Pipeline stent placed for left MCA aneurysm. Postoperative complaints of left arm and leg weakness.  EXAM: CT ANGIOGRAPHY HEAD AND NECK  TECHNIQUE: Multidetector CT imaging of the head and neck was performed using the standard protocol during bolus administration of intravenous contrast. Multiplanar CT image reconstructions and MIPs were obtained to evaluate the vascular anatomy. Carotid stenosis measurements (when applicable) are obtained utilizing NASCET criteria, using the  distal internal carotid diameter as the denominator.  CONTRAST:  5mL OMNIPAQUE IOHEXOL 350 MG/ML SOLN  COMPARISON:  Angiography same day.  CTA 12/03/2014.  FINDINGS: CT HEAD  Brain: Previous core allowing upper right vertebral aneurysm. Previous clipping of the right MCA aneurysm. Placement of pipeline stent in the left MCA region today. No evidence of intracranial hemorrhage. 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 No sign of acute infarction by CT.  Calvarium and skull base: Postoperative changes.  Nothing a acute.  Paranasal sinuses: Clear  CTA NECK  Aortic arch: There is advanced atherosclerotic disease of the arch with calcified plaque in either soft plaque or thrombus along the inferior margin of the arch. 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 Branching pattern of brachiocephalic vessels from the arch is normal without origin stenosis.  Right carotid system: Right common carotid artery widely patent to the bifurcation. Ordinary mild atherosclerotic disease at the carotid bifurcation without stenosis or irregularity. Cervical internal carotid artery widely patent.  Left carotid system: Left common carotid artery affected by diffuse atherosclerotic disease. Narrowing in the proximal portion of 40%. Vessel patent to the bifurcation with mild atherosclerotic change at the bifurcation but no stenosis or irregularity.  Vertebral arteries:Right vertebral artery dominant. Both vertebral artery origins are widely patent. Both vessels are patent through the cervical region to the foramen magnum.  Skeleton: Ordinary cervical spondylosis  Other neck: No significant soft tissue lesion.  CTA HEAD  Anterior circulation: Both carotid siphon regions widely patent. On the right, the anterior and middle cerebral vessels are patent. There is artifact related to previous MCA aneurysm clipping. I do not see any missing vessels on the right compared to the  previous study of 12/03/2014. On the left, pipeline stent is in place beginning at the M1 segment and extending into 1 of the left MCA branches. This spans the location of the fusiform aneurysm of the MCA branch, which measures 2.6 x 5.4 mm. No missing branch vessels are seen on the left. One could question if there is mild spasm of the MCA branch of medially beyond the and of the stent. Beyond that area, the vessel appears normal as it did before.  Posterior circulation: The right vertebral artery is a large vessel widely patent to the basilar. 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 The left vertebral artery is a small vessel that largely terminates in PICA. There is not definitely any flow beyond PICA to the basilar. The basilar artery shows mild atherosclerotic irregularity but no flow-limiting stenosis. Superior cerebellar and posterior cerebral vessels are patent and normal.  Venous sinuses: Patent and normal.  Anatomic variants: No insignificant  Delayed phase: No significant finding  IMPRESSION: Pipeline stent placed in the left MCA spanning a left MCA aneurysm. No complications seen relative to that. No missing vessels. No hemorrhage. One could question mild spasm of the MCA branch just beyond the end of the stent, but the vessel is widely patent beyond that and appears as it did before.  Previously clipped right MCA region aneurysm. No missing vessels demonstrated on the right compared to the study of 12/03/2014.  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   Electronically Signed   By: Nelson Chimes M.D.   On: 01/20/2015 17:50    Labs:  CBC:  Recent Labs  01/18/15 1308 01/21/15 0505  WBC 9.1 12.1*  HGB 15.0 11.9*  HCT 43.2 34.8*  PLT 217 187    COAGS:  Recent Labs  01/18/15 1308  INR 0.95  APTT 31    BMP:  Recent Labs  11/24/14 1450 01/18/15 1308 01/21/15 0505  NA 143 140 141  K 4.0 3.8 3.8  CL  103 105 108  CO2 25 26 29   GLUCOSE 146* 181* 105*  BUN 18 12 7   CALCIUM 9.8 8.6 7.9*  CREATININE 0.92 1.05 0.69  GFRNONAA 64 53* 87*  GFRAA 74 62* >90    LIVER FUNCTION TESTS:  Recent Labs  01/18/15 1308  BILITOT 0.7  AST 19  ALT 23  ALKPHOS 94  PROT 6.4  ALBUMIN 3.9    Assessment and Plan: Left MCA aneurysm  S/p elective embolization with stent 3/9, has been on ASA and plavix x 5 days, p2y12 on 3/7 is 113, on IV heparin gtt post procedure d/c'd at 0700 today, BP wnl New Left sided ataxia, weakness and decreased sensation starting at approximately 3pm on 3/9  Code stroke called not a tPA candidate secondary to IV heparin, CT head and CTA without acute findings, question mild spasm of the MCA branch just beyond the end of the stent, but the vessel is widely patent beyond that and appears as it did before. Will get PT/OT consult today, stat p2y12 D/w Dr. Estanislado Pandy   Signed: Tsosie Billing D 01/21/2015, 8:05 AM

## 2015-01-21 NOTE — Consult Note (Signed)
Physical Medicine and Rehabilitation Consult Reason for Consult:left sided weakness Referring Physician: Dr. Estanislado Pandy   HPI: Diane Hutchinson is a 69 y.o. right handed female with history of hypertension, migraine headaches as well as multiple intracranial aneurysms with right MCA aneurysmal clipping 1993 as well as coiling 2010 without residual weakness.. Independent/driving prior to admission living with her husband. Presented 01/20/2015 with decreased balance as well as headache. She had been seen by interventional radiology in the past with plan for image guided cerebral arteriogram for findings a left MCA aneurysm. Underwent left MCA elective embolization using stent assistance 01/20/2015 per Intervention radiology. Patient was extubated noted left-sided tingling and weakness. Code stroke was called. CT of the head showed no acute stroke. CTA was then performed again showing no large vessel occlusion. She was not a TPA candidate. Neurology consulted suspect non-dominant right brain infarct, embolic secondary to complication of interventional neuroradiology aneurysm embolization. Echocardiogram is pending. She currently is maintained on aspirin and Plavix therapy. Subcutaneous Lovenox added for DVT prophylaxis. Bouts of restlessness and agitation her Risperdal and Desyrel as prior to admission was resumed. Physical therapy evaluation completed 01/21/2015 with recommendations of physical medicine rehabilitation consult.   Review of Systems  Neurological: Positive for focal weakness and headaches.  Psychiatric/Behavioral: Positive for depression.  All other systems reviewed and are negative.  Past Medical History  Diagnosis Date  . Migraine   . Depression   . Hypertension   . Stroke   . COPD (chronic obstructive pulmonary disease)   . Anxiety     h/o of panic attack  . GERD (gastroesophageal reflux disease)     occas. use of TUMS  . Arthritis     knees   Past Surgical  History  Procedure Laterality Date  . [place] surgery    . Cholecystectomy    . Appendectomy    . Eye surgery      laser to both eyes for blocked tear ducts   . Brain surgery  1993    aneurysm  . Tubal ligation    . Hemorroidectomy     Family History  Problem Relation Age of Onset  . Stroke Neg Hx    Social History:  reports that she has been smoking.  She has never used smokeless tobacco. She reports that she does not drink alcohol or use illicit drugs. Allergies:  Allergies  Allergen Reactions  . Codeine Other (See Comments)    dellusions  . Meperidine Hcl Other (See Comments)    Hurts stomach  . Morphine Other (See Comments)    dellusions  . Sulfonamide Derivatives Other (See Comments)    Drives her nuts   Medications Prior to Admission  Medication Sig Dispense Refill  . alendronate (FOSAMAX) 70 MG tablet Take 70 mg by mouth every Monday. Take with a full glass of water on an empty stomach.    . ALPRAZolam (XANAX) 1 MG tablet Take 1 mg by mouth 3 (three) times daily as needed for anxiety.    Marland Kitchen aspirin 81 MG tablet Take 324 mg by mouth once.    Marland Kitchen atorvastatin (LIPITOR) 40 MG tablet Take 40 mg by mouth daily.    . Cholecalciferol (VITAMIN D PO) Take 1 tablet by mouth daily.    . clopidogrel (PLAVIX) 75 MG tablet Take 75 mg by mouth daily.    Marland Kitchen gemfibrozil (LOPID) 600 MG tablet Take 600 mg by mouth 2 (two) times daily before a meal.    . meloxicam (  MOBIC) 15 MG tablet Take 15 mg by mouth daily.    . metoprolol succinate (TOPROL XL) 25 MG 24 hr tablet Take 25 mg by mouth at bedtime.     . Omega-3 Fatty Acids (FISH OIL PO) Take 1 capsule by mouth daily.    . risperiDONE (RISPERDAL) 0.5 MG tablet Take 0.5 mg by mouth at bedtime.    . sertraline (ZOLOFT) 100 MG tablet Take 200 mg by mouth at bedtime.   4  . traZODone (DESYREL) 150 MG tablet Take 300 mg by mouth at bedtime.    Marland Kitchen VITAMIN E PO Take 1 tablet by mouth daily.    Marland Kitchen albuterol (PROVENTIL) (2.5 MG/3ML) 0.083% nebulizer  solution Take 2.5 mg by nebulization every 6 (six) hours as needed for wheezing or shortness of breath.    Marland Kitchen ipratropium (ATROVENT) 0.02 % nebulizer solution Take 0.5 mg by nebulization every 6 (six) hours as needed for wheezing or shortness of breath.       Home: Home Living Family/patient expects to be discharged to:: Private residence Living Arrangements: Spouse/significant other  Functional History:   Functional Status:  Mobility:   Bed Mobility Overal bed mobility: Needs Assistance Bed Mobility: Supine to Sit     Supine to sit: Mod assist     General bed mobility comments: cues for step-by-step sequencing, attending to L side, and encouragement. pt tends to neglect L UE > LE.   Transfers Overall transfer level: Needs assistance Equipment used: 2 person hand held assist Transfers: Sit to/from [facility] Sit to Stand: Mod assist Stand pivot transfers: Max assist;+2 physical assistance       General transfer comment: pt able to come to stand with one person A and use of R UE on armrest of recliner. pt with por awareness of L side and poor proprioception as pt's L foot tends to supinate. cues and facilitation for trunk/hip extension into standing and to complete pivot        ADL:    Cognition: Cognition Orientation Level: Oriented X4    Blood pressure 128/108, pulse 62, temperature 98.7 F (37.1 C), temperature source Oral, resp. rate 17, height 5\' 7"  (1.702 m), weight 80.74 kg (178 lb), SpO2 99 %. Physical Exam  Vitals reviewed. Constitutional: She is oriented to person, place, and time. She appears well-developed and well-nourished.  HENT:  Head: Normocephalic and atraumatic.  Right Ear: External ear normal.  Eyes: Conjunctivae and EOM are normal. Pupils are equal, round, and reactive to light. Right eye exhibits no discharge. Left eye exhibits no discharge.  Neck: Normal range of motion. Neck supple. No JVD present. No tracheal deviation  present. No thyromegaly present.  Cardiovascular: Normal rate, regular rhythm and normal heart sounds.  Exam reveals no friction rub.   No murmur heard. Respiratory: Effort normal and breath sounds normal. No respiratory distress. She has no wheezes. She has no rales.  GI: Soft. Bowel sounds are normal. She exhibits no distension. There is no tenderness. There is no rebound.  Musculoskeletal: She exhibits no edema or tenderness.  Neurological: She is alert and oriented to person, place, and time.  Follows commands. Fair awareness of deficits. LUE: delt 3+, bicep and tricep 3, wrist and HI 3-. LLE: 1+ to 2- HF, KE and 2/5 ADF/APF. RUE and RLE 5/5.  Decreased sensation to LT and PP on left face,tongue, arm, leg. Mild left central 7. Speech slightly slurred but very intelligible. DTR's 1+. Toes up on left.   Skin: Skin  is warm and dry. No rash noted. No erythema.    Results for orders placed or performed during the hospital encounter of 01/20/15 (from the past 24 hour(s))  MRSA PCR Screening     Status: None   Collection Time: 01/20/15  4:00 PM  Result Value Ref Range   MRSA by PCR NEGATIVE NEGATIVE  Heparin level (unfractionated)     Status: Abnormal   Collection Time: 01/20/15 11:00 PM  Result Value Ref Range   Heparin Unfractionated 0.14 (L) 0.30 - 0.70 IU/mL  Basic metabolic panel     Status: Abnormal   Collection Time: 01/21/15  5:05 AM  Result Value Ref Range   Sodium 141 135 - 145 mmol/L   Potassium 3.8 3.5 - 5.1 mmol/L   Chloride 108 96 - 112 mmol/L   CO2 29 19 - 32 mmol/L   Glucose, Bld 105 (H) 70 - 99 mg/dL   BUN 7 6 - 23 mg/dL   Creatinine, Ser 0.69 0.50 - 1.10 mg/dL   Calcium 7.9 (L) 8.4 - 10.5 mg/dL   GFR calc non Af Amer 87 (L) >90 mL/min   GFR calc Af Amer >90 >90 mL/min   Anion gap 4 (L) 5 - 15  CBC WITH DIFFERENTIAL     Status: Abnormal   Collection Time: 01/21/15  5:05 AM  Result Value Ref Range   WBC 12.1 (H) 4.0 - 10.5 K/uL   RBC 3.63 (L) 3.87 - 5.11 MIL/uL    Hemoglobin 11.9 (L) 12.0 - 15.0 g/dL   HCT 34.8 (L) 36.0 - 46.0 %   MCV 95.9 78.0 - 100.0 fL   MCH 32.8 26.0 - 34.0 pg   MCHC 34.2 30.0 - 36.0 g/dL   RDW 12.8 11.5 - 15.5 %   Platelets 187 150 - 400 K/uL   Neutrophils Relative % 71 43 - 77 %   Neutro Abs 8.6 (H) 1.7 - 7.7 K/uL   Lymphocytes Relative 22 12 - 46 %   Lymphs Abs 2.6 0.7 - 4.0 K/uL   Monocytes Relative 7 3 - 12 %   Monocytes Absolute 0.9 0.1 - 1.0 K/uL   Eosinophils Relative 0 0 - 5 %   Eosinophils Absolute 0.0 0.0 - 0.7 K/uL   Basophils Relative 0 0 - 1 %   Basophils Absolute 0.0 0.0 - 0.1 K/uL  Platelet inhibition p2y12     Status: None   Collection Time: 01/21/15  7:55 AM  Result Value Ref Range   Platelet Function  P2Y12 212 194 - 418 PRU  Urinalysis, Routine w reflex microscopic     Status: Abnormal   Collection Time: 01/21/15  8:20 AM  Result Value Ref Range   Color, Urine YELLOW YELLOW   APPearance CLEAR CLEAR   Specific Gravity, Urine 1.007 1.005 - 1.030   pH 6.5 5.0 - 8.0   Glucose, UA NEGATIVE NEGATIVE mg/dL   Hgb urine dipstick TRACE (A) NEGATIVE   Bilirubin Urine NEGATIVE NEGATIVE   Ketones, ur NEGATIVE NEGATIVE mg/dL   Protein, ur NEGATIVE NEGATIVE mg/dL   Urobilinogen, UA 0.2 0.0 - 1.0 mg/dL   Nitrite NEGATIVE NEGATIVE   Leukocytes, UA NEGATIVE NEGATIVE  Urine microscopic-add on     Status: None   Collection Time: 01/21/15  8:20 AM  Result Value Ref Range   Squamous Epithelial / LPF RARE RARE   WBC, UA 0-2 <3 WBC/hpf   RBC / HPF 0-2 <3 RBC/hpf   Bacteria, UA RARE RARE  CBC  Status: Abnormal   Collection Time: 01/21/15 11:45 AM  Result Value Ref Range   WBC 12.3 (H) 4.0 - 10.5 K/uL   RBC 3.85 (L) 3.87 - 5.11 MIL/uL   Hemoglobin 12.7 12.0 - 15.0 g/dL   HCT 37.3 36.0 - 46.0 %   MCV 96.9 78.0 - 100.0 fL   MCH 33.0 26.0 - 34.0 pg   MCHC 34.0 30.0 - 36.0 g/dL   RDW 12.9 11.5 - 15.5 %   Platelets 189 150 - 400 K/uL   Ct Angio Head W/cm &/or Wo Cm  01/20/2015   CLINICAL DATA:  Pipeline  stent placed for left MCA aneurysm. Postoperative complaints of left arm and leg weakness.  EXAM: CT ANGIOGRAPHY HEAD AND NECK  TECHNIQUE: Multidetector CT imaging of the head and neck was performed using the standard protocol during bolus administration of intravenous contrast. Multiplanar CT image reconstructions and MIPs were obtained to evaluate the vascular anatomy. Carotid stenosis measurements (when applicable) are obtained utilizing NASCET criteria, using the distal internal carotid diameter as the denominator.  CONTRAST:  32mL OMNIPAQUE IOHEXOL 350 MG/ML SOLN  COMPARISON:  Angiography same day.  CTA 12/03/2014.  FINDINGS: CT HEAD  Brain: Previous core allowing upper right vertebral aneurysm. Previous clipping of the right MCA aneurysm. Placement of pipeline stent in the left MCA region today. No evidence of intracranial hemorrhage. 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 No sign of acute infarction by CT.  Calvarium and skull base: Postoperative changes.  Nothing a acute.  Paranasal sinuses: Clear  CTA NECK  Aortic arch: There is advanced atherosclerotic disease of the arch with calcified plaque in either soft plaque or thrombus along the inferior margin of the arch. 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 Branching pattern of brachiocephalic vessels from the arch is normal without origin stenosis.  Right carotid system: Right common carotid artery widely patent to the bifurcation. Ordinary mild atherosclerotic disease at the carotid bifurcation without stenosis or irregularity. Cervical internal carotid artery widely patent.  Left carotid system: Left common carotid artery affected by diffuse atherosclerotic disease. Narrowing in the proximal portion of 40%. Vessel patent to the bifurcation with mild atherosclerotic change at the bifurcation but no stenosis or irregularity.  Vertebral arteries:Right vertebral artery dominant. Both  vertebral artery origins are widely patent. Both vessels are patent through the cervical region to the foramen magnum.  Skeleton: Ordinary cervical spondylosis  Other neck: No significant soft tissue lesion.  CTA HEAD  Anterior circulation: Both carotid siphon regions widely patent. On the right, the anterior and middle cerebral vessels are patent. There is artifact related to previous MCA aneurysm clipping. I do not see any missing vessels on the right compared to the previous study of 12/03/2014. On the left, pipeline stent is in place beginning at the M1 segment and extending into 1 of the left MCA branches. This spans the location of the fusiform aneurysm of the MCA branch, which measures 2.6 x 5.4 mm. No missing branch vessels are seen on the left. One could question if there is mild spasm of the MCA branch of medially beyond the and of the stent. Beyond that area, the vessel appears normal as it did before.  Posterior circulation: The right vertebral artery is a large vessel widely patent to the basilar. 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 The left vertebral artery is a small vessel that largely terminates in PICA. There  is not definitely any flow beyond PICA to the basilar. The basilar artery shows mild atherosclerotic irregularity but no flow-limiting stenosis. Superior cerebellar and posterior cerebral vessels are patent and normal.  Venous sinuses: Patent and normal.  Anatomic variants: No insignificant  Delayed phase: No significant finding  IMPRESSION: Pipeline stent placed in the left MCA spanning a left MCA aneurysm. No complications seen relative to that. No missing vessels. No hemorrhage. One could question mild spasm of the MCA branch just beyond the end of the stent, but the vessel is widely patent beyond that and appears as it did before.  Previously clipped right MCA region aneurysm. No missing vessels demonstrated on the right compared to the study of  12/03/2014.  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   Electronically Signed   By: Nelson Chimes M.D.   On: 01/20/2015 17:50   Ct Head Wo Contrast  01/20/2015   CLINICAL DATA:  Pipeline stent placed for left MCA aneurysm. Postoperative complaints of left arm and leg weakness.  EXAM: CT ANGIOGRAPHY HEAD AND NECK  TECHNIQUE: Multidetector CT imaging of the head and neck was performed using the standard protocol during bolus administration of intravenous contrast. Multiplanar CT image reconstructions and MIPs were obtained to evaluate the vascular anatomy. Carotid stenosis measurements (when applicable) are obtained utilizing NASCET criteria, using the distal internal carotid diameter as the denominator.  CONTRAST:  28mL OMNIPAQUE IOHEXOL 350 MG/ML SOLN  COMPARISON:  Angiography same day.  CTA 12/03/2014.  FINDINGS: CT HEAD  Brain: Previous core allowing upper right vertebral aneurysm. Previous clipping of the right MCA aneurysm. Placement of pipeline stent in the left MCA region today. No evidence of intracranial hemorrhage. 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 No sign of acute infarction by CT.  Calvarium and skull base: Postoperative changes.  Nothing a acute.  Paranasal sinuses: Clear  CTA NECK  Aortic arch: There is advanced atherosclerotic disease of the arch with calcified plaque in either soft plaque or thrombus along the inferior margin of the arch. 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 Branching pattern of brachiocephalic vessels from the arch is normal without origin stenosis.  Right carotid system: Right common carotid artery widely patent to the bifurcation. Ordinary mild atherosclerotic disease at the carotid bifurcation without stenosis or irregularity. Cervical internal carotid artery widely patent.  Left carotid system: Left common carotid artery affected by diffuse atherosclerotic  disease. Narrowing in the proximal portion of 40%. Vessel patent to the bifurcation with mild atherosclerotic change at the bifurcation but no stenosis or irregularity.  Vertebral arteries:Right vertebral artery dominant. Both vertebral artery origins are widely patent. Both vessels are patent through the cervical region to the foramen magnum.  Skeleton: Ordinary cervical spondylosis  Other neck: No significant soft tissue lesion.  CTA HEAD  Anterior circulation: Both carotid siphon regions widely patent. On the right, the anterior and middle cerebral vessels are patent. There is artifact related to previous MCA aneurysm clipping. I do not see any missing vessels on the right compared to the previous study of 12/03/2014. On the left, pipeline stent is in place beginning at the M1 segment and extending into 1 of the left MCA branches. This spans the location of the fusiform aneurysm of the MCA branch, which measures 2.6 x 5.4 mm. No missing branch vessels are seen on the left. One could question if there is mild spasm of the MCA  branch of medially beyond the and of the stent. Beyond that area, the vessel appears normal as it did before.  Posterior circulation: The right vertebral artery is a large vessel widely patent to the basilar. 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 The left vertebral artery is a small vessel that largely terminates in PICA. There is not definitely any flow beyond PICA to the basilar. The basilar artery shows mild atherosclerotic irregularity but no flow-limiting stenosis. Superior cerebellar and posterior cerebral vessels are patent and normal.  Venous sinuses: Patent and normal.  Anatomic variants: No insignificant  Delayed phase: No significant finding  IMPRESSION: Pipeline stent placed in the left MCA spanning a left MCA aneurysm. No complications seen relative to that. No missing vessels. No hemorrhage. One could question mild spasm of the MCA branch  just beyond the end of the stent, but the vessel is widely patent beyond that and appears as it did before.  Previously clipped right MCA region aneurysm. No missing vessels demonstrated on the right compared to the study of 12/03/2014.  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   Electronically Signed   By: Nelson Chimes M.D.   On: 01/20/2015 17:50   Ct Angio Neck W/cm &/or Wo/cm  01/20/2015   CLINICAL DATA:  Pipeline stent placed for left MCA aneurysm. Postoperative complaints of left arm and leg weakness.  EXAM: CT ANGIOGRAPHY HEAD AND NECK  TECHNIQUE: Multidetector CT imaging of the head and neck was performed using the standard protocol during bolus administration of intravenous contrast. Multiplanar CT image reconstructions and MIPs were obtained to evaluate the vascular anatomy. Carotid stenosis measurements (when applicable) are obtained utilizing NASCET criteria, using the distal internal carotid diameter as the denominator.  CONTRAST:  41mL OMNIPAQUE IOHEXOL 350 MG/ML SOLN  COMPARISON:  Angiography same day.  CTA 12/03/2014.  FINDINGS: CT HEAD  Brain: Previous core allowing upper right vertebral aneurysm. Previous clipping of the right MCA aneurysm. Placement of pipeline stent in the left MCA region today. No evidence of intracranial hemorrhage. 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 No sign of acute infarction by CT.  Calvarium and skull base: Postoperative changes.  Nothing a acute.  Paranasal sinuses: Clear  CTA NECK  Aortic arch: There is advanced atherosclerotic disease of the arch with calcified plaque in either soft plaque or thrombus along the inferior margin of the arch. 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 Branching pattern of brachiocephalic vessels from the arch is normal without origin stenosis.  Right carotid system: Right common carotid artery widely patent to the bifurcation.  Ordinary mild atherosclerotic disease at the carotid bifurcation without stenosis or irregularity. Cervical internal carotid artery widely patent.  Left carotid system: Left common carotid artery affected by diffuse atherosclerotic disease. Narrowing in the proximal portion of 40%. Vessel patent to the bifurcation with mild atherosclerotic change at the bifurcation but no stenosis or irregularity.  Vertebral arteries:Right vertebral artery dominant. Both vertebral artery origins are widely patent. Both vessels are patent through the cervical region to the foramen magnum.  Skeleton: Ordinary cervical spondylosis  Other neck: No significant soft tissue lesion.  CTA HEAD  Anterior circulation: Both carotid siphon regions widely patent. On the right, the anterior and middle cerebral vessels are patent. There is artifact related to previous MCA aneurysm clipping. I do not see any missing vessels on the right compared to the previous study of  12/03/2014. On the left, pipeline stent is in place beginning at the M1 segment and extending into 1 of the left MCA branches. This spans the location of the fusiform aneurysm of the MCA branch, which measures 2.6 x 5.4 mm. No missing branch vessels are seen on the left. One could question if there is mild spasm of the MCA branch of medially beyond the and of the stent. Beyond that area, the vessel appears normal as it did before.  Posterior circulation: The right vertebral artery is a large vessel widely patent to the basilar. 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 The left vertebral artery is a small vessel that largely terminates in PICA. There is not definitely any flow beyond PICA to the basilar. The basilar artery shows mild atherosclerotic irregularity but no flow-limiting stenosis. Superior cerebellar and posterior cerebral vessels are patent and normal.  Venous sinuses: Patent and normal.  Anatomic variants: No insignificant  Delayed  phase: No significant finding  IMPRESSION: Pipeline stent placed in the left MCA spanning a left MCA aneurysm. No complications seen relative to that. No missing vessels. No hemorrhage. One could question mild spasm of the MCA branch just beyond the end of the stent, but the vessel is widely patent beyond that and appears as it did before.  Previously clipped right MCA region aneurysm. No missing vessels demonstrated on the right compared to the study of 12/03/2014.  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   Electronically Signed   By: Nelson Chimes M.D.   On: 01/20/2015 17:50    Assessment/Plan: Diagnosis: embolic right brain infarct after embolization procedure 1. Does the need for close, 24 hr/day medical supervision in concert with the patient's rehab needs make it unreasonable for this patient to be served in a less intensive setting? Yes 2. Co-Morbidities requiring supervision/potential complications: Depression COPD,  3. Due to bladder management, bowel management, safety, skin/wound care, disease management, medication administration, pain management and patient education, does the patient require 24 hr/day rehab nursing? Yes 4. Does the patient require coordinated care of a physician, rehab nurse, PT (1-2 hrs/day, 5 days/week), OT (1-2 hrs/day, 5 days/week) and SLP (1-2 hrs/day, 5 days/week) to address physical and functional deficits in the context of the above medical diagnosis(es)? Yes Addressing deficits in the following areas: balance, endurance, locomotion, strength, transferring, bowel/bladder control, bathing, dressing, feeding, grooming, toileting, speech and swallowing 5. Can the patient actively participate in an intensive therapy program of at least 3 hrs of therapy per day at least 5 days per week? Yes 6. The potential for patient to make measurable gains while on inpatient rehab is excellent 7. Anticipated functional outcomes upon discharge  from inpatient rehab are modified independent  with PT, modified independent with OT, modified independent with SLP. 8. Estimated rehab length of stay to reach the above functional goals is: 14-18 days 9. Does the patient have adequate social supports and living environment to accommodate these discharge functional goals? Yes 10. Anticipated D/C setting: Home 11. Anticipated post D/C treatments: [place] therapy 12. Overall Rehab/Functional Prognosis: excellent  RECOMMENDATIONS: This patient's condition is appropriate for continued rehabilitative care in the following setting: CIR Patient has agreed to participate in recommended program. Yes Note that insurance prior authorization may be required for reimbursement for recommended care.  Comment: Rehab Admissions Coordinator to follow up.  Thanks,  Meredith Staggers, MD, Mellody Drown     01/21/2015

## 2015-01-22 ENCOUNTER — Inpatient Hospital Stay (HOSPITAL_COMMUNITY)
Admission: RE | Admit: 2015-01-22 | Discharge: 2015-02-12 | DRG: 065 | Disposition: A | Discharge status: Home / Self Care | Payer: Medicare Other | Source: Intra-hospital | Attending: Physical Medicine & Rehabilitation | Admitting: Physical Medicine & Rehabilitation

## 2015-01-22 ENCOUNTER — Encounter (HOSPITAL_COMMUNITY): Payer: Self-pay

## 2015-01-22 ENCOUNTER — Inpatient Hospital Stay (HOSPITAL_COMMUNITY): Payer: Medicare Other

## 2015-01-22 DIAGNOSIS — F410 Panic disorder [episodic paroxysmal anxiety] without agoraphobia: Secondary | ICD-10-CM | POA: Diagnosis present

## 2015-01-22 DIAGNOSIS — I69398 Other sequelae of cerebral infarction: Secondary | ICD-10-CM | POA: Insufficient documentation

## 2015-01-22 DIAGNOSIS — G8190 Hemiplegia, unspecified affecting unspecified side: Secondary | ICD-10-CM | POA: Diagnosis not present

## 2015-01-22 DIAGNOSIS — I10 Essential (primary) hypertension: Secondary | ICD-10-CM | POA: Diagnosis present

## 2015-01-22 DIAGNOSIS — G8194 Hemiplegia, unspecified affecting left nondominant side: Secondary | ICD-10-CM

## 2015-01-22 DIAGNOSIS — I6340 Cerebral infarction due to embolism of unspecified cerebral artery: Secondary | ICD-10-CM | POA: Diagnosis not present

## 2015-01-22 DIAGNOSIS — I63511 Cerebral infarction due to unspecified occlusion or stenosis of right middle cerebral artery: Secondary | ICD-10-CM | POA: Diagnosis present

## 2015-01-22 DIAGNOSIS — I69898 Other sequelae of other cerebrovascular disease: Secondary | ICD-10-CM | POA: Diagnosis not present

## 2015-01-22 DIAGNOSIS — I69354 Hemiplegia and hemiparesis following cerebral infarction affecting left non-dominant side: Secondary | ICD-10-CM

## 2015-01-22 DIAGNOSIS — I671 Cerebral aneurysm, nonruptured: Principal | ICD-10-CM

## 2015-01-22 DIAGNOSIS — I639 Cerebral infarction, unspecified: Secondary | ICD-10-CM | POA: Insufficient documentation

## 2015-01-22 DIAGNOSIS — R208 Other disturbances of skin sensation: Secondary | ICD-10-CM | POA: Diagnosis not present

## 2015-01-22 DIAGNOSIS — E876 Hypokalemia: Secondary | ICD-10-CM | POA: Diagnosis present

## 2015-01-22 DIAGNOSIS — I679 Cerebrovascular disease, unspecified: Secondary | ICD-10-CM

## 2015-01-22 DIAGNOSIS — R209 Unspecified disturbances of skin sensation: Secondary | ICD-10-CM

## 2015-01-22 DIAGNOSIS — E785 Hyperlipidemia, unspecified: Secondary | ICD-10-CM | POA: Diagnosis present

## 2015-01-22 DIAGNOSIS — M6281 Muscle weakness (generalized): Secondary | ICD-10-CM | POA: Diagnosis not present

## 2015-01-22 LAB — CBC
HEMATOCRIT: 39.6 % (ref 36.0–46.0)
Hemoglobin: 13.6 g/dL (ref 12.0–15.0)
MCH: 32.5 pg (ref 26.0–34.0)
MCHC: 34.3 g/dL (ref 30.0–36.0)
MCV: 94.7 fL (ref 78.0–100.0)
PLATELETS: 182 10*3/uL (ref 150–400)
RBC: 4.18 MIL/uL (ref 3.87–5.11)
RDW: 12.6 % (ref 11.5–15.5)
WBC: 8.8 10*3/uL (ref 4.0–10.5)

## 2015-01-22 LAB — CREATININE, SERUM
Creatinine, Ser: 0.78 mg/dL (ref 0.50–1.10)
GFR calc Af Amer: >90 mL/min (ref >90)
GFR calc non Af Amer: 84 mL/min — ABNORMAL LOW (ref >90)

## 2015-01-22 LAB — LIPID PANEL
CHOL/HDL RATIO: 3.4 ratio
CHOLESTEROL: 116 mg/dL (ref 0–200)
HDL: 34 mg/dL — ABNORMAL LOW (ref >39)
LDL Cholesterol: 53 mg/dL (ref 0–99)
TRIGLYCERIDES: 146 mg/dL (ref <150)
VLDL: 29 mg/dL (ref 0–40)

## 2015-01-22 LAB — HEMOGLOBIN A1C
Hgb A1c MFr Bld: 6 % — ABNORMAL HIGH (ref 4.8–5.6)
Mean Plasma Glucose: 126 mg/dL

## 2015-01-22 MED ORDER — SERTRALINE HCL 100 MG PO TABS
100.0000 mg | ORAL_TABLET | Freq: Every day | ORAL | Status: DC
  Administered 2015-01-22 – 2015-01-31 (×10): 100 mg via ORAL
  Filled 2015-01-22 (×11): qty 1

## 2015-01-22 MED ORDER — SORBITOL 70 % SOLN
30.0000 mL | Freq: Every day | Status: DC | PRN
  Administered 2015-02-01: 30 mL via ORAL
  Filled 2015-01-22: qty 30

## 2015-01-22 MED ORDER — OMEGA-3-ACID ETHYL ESTERS 1 G PO CAPS
1.0000 g | ORAL_CAPSULE | Freq: Every day | ORAL | Status: DC
  Administered 2015-01-23 – 2015-02-12 (×21): 1 g via ORAL
  Filled 2015-01-22 (×22): qty 1

## 2015-01-22 MED ORDER — CLOPIDOGREL BISULFATE 75 MG PO TABS
75.0000 mg | ORAL_TABLET | Freq: Every day | ORAL | Status: DC
  Administered 2015-01-23 – 2015-02-12 (×21): 75 mg via ORAL
  Filled 2015-01-22 (×23): qty 1

## 2015-01-22 MED ORDER — TRAZODONE HCL 50 MG PO TABS
150.0000 mg | ORAL_TABLET | Freq: Every day | ORAL | Status: DC
  Administered 2015-01-22 – 2015-01-31 (×10): 150 mg via ORAL
  Filled 2015-01-22 (×9): qty 3

## 2015-01-22 MED ORDER — ACETAMINOPHEN 650 MG RE SUPP: 650.0000 mg | Freq: Four times a day (QID) | RECTAL | Status: DC | PRN

## 2015-01-22 MED ORDER — ACETAMINOPHEN 500 MG PO TABS
1000.0000 mg | ORAL_TABLET | Freq: Four times a day (QID) | ORAL | Status: DC | PRN
  Administered 2015-01-22 – 2015-01-26 (×8): 1000 mg via ORAL
  Filled 2015-01-22 (×9): qty 2

## 2015-01-22 MED ORDER — ONDANSETRON HCL 4 MG PO TABS
4.0000 mg | ORAL_TABLET | Freq: Four times a day (QID) | ORAL | Status: DC | PRN
  Administered 2015-01-25: 4 mg via ORAL
  Filled 2015-01-22: qty 1

## 2015-01-22 MED ORDER — ENOXAPARIN SODIUM 40 MG/0.4ML ~~LOC~~ SOLN
40.0000 mg | SUBCUTANEOUS | Status: DC
  Administered 2015-01-23 – 2015-02-12 (×21): 40 mg via SUBCUTANEOUS
  Filled 2015-01-22 (×21): qty 0.4

## 2015-01-22 MED ORDER — ASPIRIN 325 MG PO TABS
325.0000 mg | ORAL_TABLET | Freq: Every day | ORAL | Status: DC
  Administered 2015-01-23 – 2015-02-12 (×21): 325 mg via ORAL
  Filled 2015-01-22 (×23): qty 1

## 2015-01-22 MED ORDER — ATORVASTATIN CALCIUM 40 MG PO TABS
40.0000 mg | ORAL_TABLET | Freq: Every day | ORAL | Status: DC
  Administered 2015-01-22 – 2015-02-11 (×21): 40 mg via ORAL
  Filled 2015-01-22 (×24): qty 1

## 2015-01-22 MED ORDER — SENNOSIDES-DOCUSATE SODIUM 8.6-50 MG PO TABS
1.0000 | ORAL_TABLET | Freq: Two times a day (BID) | ORAL | Status: DC
  Administered 2015-01-22 – 2015-02-12 (×41): 1 via ORAL
  Filled 2015-01-22 (×40): qty 1

## 2015-01-22 MED ORDER — ALPRAZOLAM 0.5 MG PO TABS
1.0000 mg | ORAL_TABLET | Freq: Three times a day (TID) | ORAL | Status: DC | PRN
  Administered 2015-01-22 – 2015-02-11 (×27): 1 mg via ORAL
  Filled 2015-01-22 (×21): qty 2
  Filled 2015-01-22: qty 4
  Filled 2015-01-22 (×3): qty 2
  Filled 2015-01-22: qty 4
  Filled 2015-01-22 (×3): qty 2

## 2015-01-22 MED ORDER — ASPIRIN 325 MG PO TABS: 325.0000 mg | ORAL_TABLET | Freq: Every day | ORAL | Status: DC

## 2015-01-22 MED ORDER — ACETAMINOPHEN 500 MG PO TABS: 1000.0000 mg | ORAL_TABLET | Freq: Four times a day (QID) | ORAL | Status: DC | PRN

## 2015-01-22 MED ORDER — STROKE: EARLY STAGES OF RECOVERY BOOK
Freq: Once | Status: AC
  Administered 2015-01-22: 13:00:00
  Filled 2015-01-22: qty 1

## 2015-01-22 MED ORDER — ENOXAPARIN SODIUM 40 MG/0.4ML ~~LOC~~ SOLN: 40.0000 mg | SUBCUTANEOUS | Status: DC

## 2015-01-22 MED ORDER — ONDANSETRON HCL 4 MG/2ML IJ SOLN: 4.0000 mg | Freq: Four times a day (QID) | INTRAMUSCULAR | Status: DC | PRN

## 2015-01-22 MED ORDER — RISPERIDONE 0.5 MG PO TABS
0.5000 mg | ORAL_TABLET | Freq: Every day | ORAL | Status: DC
  Administered 2015-01-22 – 2015-01-31 (×10): 0.5 mg via ORAL
  Filled 2015-01-22 (×11): qty 1

## 2015-01-22 NOTE — Progress Notes (Signed)
Subjective: Patient with continued weakness and lack of sensation on left side.  Allergies: Codeine; Meperidine hcl; Morphine; and Sulfonamide derivatives  Medications: Prior to Admission medications   Medication Sig Start Date End Date Taking? Authorizing Provider  alendronate (FOSAMAX) 70 MG tablet Take 70 mg by mouth every Monday. Take with a full glass of water on an empty stomach.   Yes Historical Provider, MD  ALPRAZolam Duanne Moron) 1 MG tablet Take 1 mg by mouth 3 (three) times daily as needed for anxiety.   Yes Historical Provider, MD  aspirin 81 MG tablet Take 324 mg by mouth once.   Yes Historical Provider, MD  atorvastatin (LIPITOR) 40 MG tablet Take 40 mg by mouth daily.   Yes Historical Provider, MD  Cholecalciferol (VITAMIN D PO) Take 1 tablet by mouth daily.   Yes Historical Provider, MD  clopidogrel (PLAVIX) 75 MG tablet Take 75 mg by mouth daily.   Yes Historical Provider, MD  gemfibrozil (LOPID) 600 MG tablet Take 600 mg by mouth 2 (two) times daily before a meal.   Yes Historical Provider, MD  meloxicam (MOBIC) 15 MG tablet Take 15 mg by mouth daily.   Yes Historical Provider, MD  metoprolol succinate (TOPROL XL) 25 MG 24 hr tablet Take 25 mg by mouth at bedtime.    Yes Historical Provider, MD  Omega-3 Fatty Acids (FISH OIL PO) Take 1 capsule by mouth daily.   Yes Historical Provider, MD  risperiDONE (RISPERDAL) 0.5 MG tablet Take 0.5 mg by mouth at bedtime.   Yes Historical Provider, MD  sertraline (ZOLOFT) 100 MG tablet Take 200 mg by mouth at bedtime.  11/16/14  Yes Historical Provider, MD  traZODone (DESYREL) 150 MG tablet Take 300 mg by mouth at bedtime.   Yes Historical Provider, MD  VITAMIN E PO Take 1 tablet by mouth daily.   Yes Historical Provider, MD  albuterol (PROVENTIL) (2.5 MG/3ML) 0.083% nebulizer solution Take 2.5 mg by nebulization every 6 (six) hours as needed for wheezing or shortness of breath.    Historical Provider, MD  ipratropium (ATROVENT) 0.02 %  nebulizer solution Take 0.5 mg by nebulization every 6 (six) hours as needed for wheezing or shortness of breath.     Historical Provider, MD    Vital Signs: BP 142/89 mmHg  Pulse 78  Temp(Src) 98.9 F (37.2 C) (Oral)  Resp 15  Ht 5\' 7"  (1.702 m)  Wt 178 lb (80.74 kg)  BMI 27.87 kg/m2  SpO2 90%  Physical Exam General: A&Ox3, NAD Neuro: Speech clear- no aphasia, EOMI, shoulder shrug intact and strong against resistance, smile and tongue symmetrical-may have slight left sided asymmetry, LUE ataxia and 3/5 strength compared to RUE 5/5, LLE dorsiflexion weakness, plantar flexion intact and strong, RLE strength 5/5, left sided sensation decreased, LUE and LLE drift Ext: RCFA access dressing C/D/I, soft, no signs of bleeding/hematoma  Imaging: Ct Angio Head W/cm &/or Wo Cm  01/20/2015   CLINICAL DATA:  Pipeline stent placed for left MCA aneurysm. Postoperative complaints of left arm and leg weakness.  EXAM: CT ANGIOGRAPHY HEAD AND NECK  TECHNIQUE: Multidetector CT imaging of the head and neck was performed using the standard protocol during bolus administration of intravenous contrast. Multiplanar CT image reconstructions and MIPs were obtained to evaluate the vascular anatomy. Carotid stenosis measurements (when applicable) are obtained utilizing NASCET criteria, using the distal internal carotid diameter as the denominator.  CONTRAST:  63mL OMNIPAQUE IOHEXOL 350 MG/ML SOLN  COMPARISON:  Angiography same day.  CTA 12/03/2014.  FINDINGS: CT HEAD  Brain: Previous core allowing upper right vertebral aneurysm. Previous clipping of the right MCA aneurysm. Placement of pipeline stent in the left MCA region today. No evidence of intracranial hemorrhage. 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 No sign of acute infarction by CT.  Calvarium and skull base: Postoperative changes.  Nothing a acute.  Paranasal sinuses: Clear  CTA NECK  Aortic arch: There is advanced atherosclerotic disease of the  arch with calcified plaque in either soft plaque or thrombus along the inferior margin of the arch. 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 Branching pattern of brachiocephalic vessels from the arch is normal without origin stenosis.  Right carotid system: Right common carotid artery widely patent to the bifurcation. Ordinary mild atherosclerotic disease at the carotid bifurcation without stenosis or irregularity. Cervical internal carotid artery widely patent.  Left carotid system: Left common carotid artery affected by diffuse atherosclerotic disease. Narrowing in the proximal portion of 40%. Vessel patent to the bifurcation with mild atherosclerotic change at the bifurcation but no stenosis or irregularity.  Vertebral arteries:Right vertebral artery dominant. Both vertebral artery origins are widely patent. Both vessels are patent through the cervical region to the foramen magnum.  Skeleton: Ordinary cervical spondylosis  Other neck: No significant soft tissue lesion.  CTA HEAD  Anterior circulation: Both carotid siphon regions widely patent. On the right, the anterior and middle cerebral vessels are patent. There is artifact related to previous MCA aneurysm clipping. I do not see any missing vessels on the right compared to the previous study of 12/03/2014. On the left, pipeline stent is in place beginning at the M1 segment and extending into 1 of the left MCA branches. This spans the location of the fusiform aneurysm of the MCA branch, which measures 2.6 x 5.4 mm. No missing branch vessels are seen on the left. One could question if there is mild spasm of the MCA branch of medially beyond the and of the stent. Beyond that area, the vessel appears normal as it did before.  Posterior circulation: The right vertebral artery is a large vessel widely patent to the basilar. 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 The left  vertebral artery is a small vessel that largely terminates in PICA. There is not definitely any flow beyond PICA to the basilar. The basilar artery shows mild atherosclerotic irregularity but no flow-limiting stenosis. Superior cerebellar and posterior cerebral vessels are patent and normal.  Venous sinuses: Patent and normal.  Anatomic variants: No insignificant  Delayed phase: No significant finding  IMPRESSION: Pipeline stent placed in the left MCA spanning a left MCA aneurysm. No complications seen relative to that. No missing vessels. No hemorrhage. One could question mild spasm of the MCA branch just beyond the end of the stent, but the vessel is widely patent beyond that and appears as it did before.  Previously clipped right MCA region aneurysm. No missing vessels demonstrated on the right compared to the study of 12/03/2014.  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   Electronically Signed   By: Nelson Chimes M.D.   On: 01/20/2015 17:50   Ct Head Wo Contrast  01/22/2015   CLINICAL DATA:  Status post pipeline stent placed 01/20/2015, postoperative left arm end like weakness  EXAM: CT HEAD WITHOUT CONTRAST  TECHNIQUE: Contiguous axial images were obtained from the base of the skull through the  vertex without intravenous contrast.  COMPARISON:  01/20/2015  FINDINGS: Postoperative changes are noted in the calvarium on the right consistent with prior aneurysm clipping. There are changes consistent with aneurysm clipping lung course of the right middle cerebral artery as well as prior coiling along the course of the right posterior inferior cerebellar artery. These are stable from the prior exam. A new left middle cerebral artery pipeline stent is again noted.  A rounded area of decreased attenuation is noted within the right thalamus measuring approximately 13 mm. This has progressed in the interval from the prior exam consistent with an evolving area of ischemia. No  other areas of acute hemorrhage, acute infarction or acute ischemia are noted.  IMPRESSION: Changes consistent with prior aneurysm coiling, clipping and stenting as described.  New rounded area of decreased attenuation in the right thalamus laterally consistent with an evolving area of ischemia.  These results will be called to the ordering clinician or representative by the Radiologist Assistant, and communication documented in the PACS or zVision Dashboard.   Electronically Signed   By: Inez Catalina M.D.   On: 01/22/2015 07:33   Ct Head Wo Contrast  01/20/2015   CLINICAL DATA:  Pipeline stent placed for left MCA aneurysm. Postoperative complaints of left arm and leg weakness.  EXAM: CT ANGIOGRAPHY HEAD AND NECK  TECHNIQUE: Multidetector CT imaging of the head and neck was performed using the standard protocol during bolus administration of intravenous contrast. Multiplanar CT image reconstructions and MIPs were obtained to evaluate the vascular anatomy. Carotid stenosis measurements (when applicable) are obtained utilizing NASCET criteria, using the distal internal carotid diameter as the denominator.  CONTRAST:  75mL OMNIPAQUE IOHEXOL 350 MG/ML SOLN  COMPARISON:  Angiography same day.  CTA 12/03/2014.  FINDINGS: CT HEAD  Brain: Previous core allowing upper right vertebral aneurysm. Previous clipping of the right MCA aneurysm. Placement of pipeline stent in the left MCA region today. No evidence of intracranial hemorrhage. 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 No sign of acute infarction by CT.  Calvarium and skull base: Postoperative changes.  Nothing a acute.  Paranasal sinuses: Clear  CTA NECK  Aortic arch: There is advanced atherosclerotic disease of the arch with calcified plaque in either soft plaque or thrombus along the inferior margin of the arch. 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 Branching pattern of brachiocephalic vessels  from the arch is normal without origin stenosis.  Right carotid system: Right common carotid artery widely patent to the bifurcation. Ordinary mild atherosclerotic disease at the carotid bifurcation without stenosis or irregularity. Cervical internal carotid artery widely patent.  Left carotid system: Left common carotid artery affected by diffuse atherosclerotic disease. Narrowing in the proximal portion of 40%. Vessel patent to the bifurcation with mild atherosclerotic change at the bifurcation but no stenosis or irregularity.  Vertebral arteries:Right vertebral artery dominant. Both vertebral artery origins are widely patent. Both vessels are patent through the cervical region to the foramen magnum.  Skeleton: Ordinary cervical spondylosis  Other neck: No significant soft tissue lesion.  CTA HEAD  Anterior circulation: Both carotid siphon regions widely patent. On the right, the anterior and middle cerebral vessels are patent. There is artifact related to previous MCA aneurysm clipping. I do not see any missing vessels on the right compared to the previous study of 12/03/2014. On the left, pipeline stent is in place beginning at the M1 segment and extending into 1  of the left MCA branches. This spans the location of the fusiform aneurysm of the MCA branch, which measures 2.6 x 5.4 mm. No missing branch vessels are seen on the left. One could question if there is mild spasm of the MCA branch of medially beyond the and of the stent. Beyond that area, the vessel appears normal as it did before.  Posterior circulation: The right vertebral artery is a large vessel widely patent to the basilar. 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 The left vertebral artery is a small vessel that largely terminates in PICA. There is not definitely any flow beyond PICA to the basilar. The basilar artery shows mild atherosclerotic irregularity but no flow-limiting stenosis. Superior cerebellar and  posterior cerebral vessels are patent and normal.  Venous sinuses: Patent and normal.  Anatomic variants: No insignificant  Delayed phase: No significant finding  IMPRESSION: Pipeline stent placed in the left MCA spanning a left MCA aneurysm. No complications seen relative to that. No missing vessels. No hemorrhage. One could question mild spasm of the MCA branch just beyond the end of the stent, but the vessel is widely patent beyond that and appears as it did before.  Previously clipped right MCA region aneurysm. No missing vessels demonstrated on the right compared to the study of 12/03/2014.  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   Electronically Signed   By: Nelson Chimes M.D.   On: 01/20/2015 17:50   Ct Angio Neck W/cm &/or Wo/cm  01/20/2015   CLINICAL DATA:  Pipeline stent placed for left MCA aneurysm. Postoperative complaints of left arm and leg weakness.  EXAM: CT ANGIOGRAPHY HEAD AND NECK  TECHNIQUE: Multidetector CT imaging of the head and neck was performed using the standard protocol during bolus administration of intravenous contrast. Multiplanar CT image reconstructions and MIPs were obtained to evaluate the vascular anatomy. Carotid stenosis measurements (when applicable) are obtained utilizing NASCET criteria, using the distal internal carotid diameter as the denominator.  CONTRAST:  70mL OMNIPAQUE IOHEXOL 350 MG/ML SOLN  COMPARISON:  Angiography same day.  CTA 12/03/2014.  FINDINGS: CT HEAD  Brain: Previous core allowing upper right vertebral aneurysm. Previous clipping of the right MCA aneurysm. Placement of pipeline stent in the left MCA region today. No evidence of intracranial hemorrhage. 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 No sign of acute infarction by CT.  Calvarium and skull base: Postoperative changes.  Nothing a acute.  Paranasal sinuses: Clear  CTA NECK  Aortic arch: There is advanced atherosclerotic disease of the arch with  calcified plaque in either soft plaque or thrombus along the inferior margin of the arch. 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 Branching pattern of brachiocephalic vessels from the arch is normal without origin stenosis.  Right carotid system: Right common carotid artery widely patent to the bifurcation. Ordinary mild atherosclerotic disease at the carotid bifurcation without stenosis or irregularity. Cervical internal carotid artery widely patent.  Left carotid system: Left common carotid artery affected by diffuse atherosclerotic disease. Narrowing in the proximal portion of 40%. Vessel patent to the bifurcation with mild atherosclerotic change at the bifurcation but no stenosis or irregularity.  Vertebral arteries:Right vertebral artery dominant. Both vertebral artery origins are widely patent. Both vessels are patent through the cervical region to the foramen magnum.  Skeleton: Ordinary cervical spondylosis  Other neck: No significant soft tissue lesion.  CTA HEAD  Anterior  circulation: Both carotid siphon regions widely patent. On the right, the anterior and middle cerebral vessels are patent. There is artifact related to previous MCA aneurysm clipping. I do not see any missing vessels on the right compared to the previous study of 12/03/2014. On the left, pipeline stent is in place beginning at the M1 segment and extending into 1 of the left MCA branches. This spans the location of the fusiform aneurysm of the MCA branch, which measures 2.6 x 5.4 mm. No missing branch vessels are seen on the left. One could question if there is mild spasm of the MCA branch of medially beyond the and of the stent. Beyond that area, the vessel appears normal as it did before.  Posterior circulation: The right vertebral artery is a large vessel widely patent to the basilar. 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 The left vertebral  artery is a small vessel that largely terminates in PICA. There is not definitely any flow beyond PICA to the basilar. The basilar artery shows mild atherosclerotic irregularity but no flow-limiting stenosis. Superior cerebellar and posterior cerebral vessels are patent and normal.  Venous sinuses: Patent and normal.  Anatomic variants: No insignificant  Delayed phase: No significant finding  IMPRESSION: Pipeline stent placed in the left MCA spanning a left MCA aneurysm. No complications seen relative to that. No missing vessels. No hemorrhage. One could question mild spasm of the MCA branch just beyond the end of the stent, but the vessel is widely patent beyond that and appears as it did before.  Previously clipped right MCA region aneurysm. No missing vessels demonstrated on the right compared to the study of 12/03/2014.  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   Electronically Signed   By: Nelson Chimes M.D.   On: 01/20/2015 17:50    Labs:  CBC:  Recent Labs  01/18/15 1308 01/21/15 0505 01/21/15 1145  WBC 9.1 12.1* 12.3*  HGB 15.0 11.9* 12.7  HCT 43.2 34.8* 37.3  PLT 217 187 189    COAGS:  Recent Labs  01/18/15 1308  INR 0.95  APTT 31    BMP:  Recent Labs  11/24/14 1450 01/18/15 1308 01/21/15 0505 01/21/15 1145  NA 143 140 141  --   K 4.0 3.8 3.8  --   CL 103 105 108  --   CO2 25 26 29   --   GLUCOSE 146* 181* 105*  --   BUN 18 12 7   --   CALCIUM 9.8 8.6 7.9*  --   CREATININE 0.92 1.05 0.69 0.84  GFRNONAA 64 53* 87* 70*  GFRAA 74 62* >90 81*    LIVER FUNCTION TESTS:  Recent Labs  01/18/15 1308  BILITOT 0.7  AST 19  ALT 23  ALKPHOS 94  PROT 6.4  ALBUMIN 3.9    Assessment and Plan: Left MCA aneurysm  S/p elective embolization with stent 3/9, has been on ASA and plavix x 5 days, p2y12 on 3/7 is 113, on IV heparin gtt post procedure, BP wnl New Left sided ataxia, weakness and decreased sensation starting at  approximately 3pm on 3/9  Code stroke called not a tPA candidate secondary to IV heparin, CT head and CTA without acute findings, question mild spasm of the MCA branch just beyond the end of the stent, but the vessel is widely patent beyond that and appears as it did before. PT/OT consult, IP Rehab consult, Appreciate assistance.  Appreciate Neurology input, CT head today  with new decreased attenuation in right thalamus laterally-consistent with evolving ischemia.  Continue ASA 325mg  and Plavix 75mg  daily  Patient has been seen with Dr. Estanislado Pandy today.   SignedHedy Jacob 01/22/2015, 8:52 AM

## 2015-01-22 NOTE — Progress Notes (Signed)
STROKE TEAM PROGRESS NOTE   HISTORY Diane Hutchinson is an 69 y.o. female who under went a Left MCA elective embolization using stent assistance today 01/20/2015. Post procedure she was noted to have left arm tingling and ataxia of left arm and leg. Code stroke was called at 1500 (LKW). Patient was brought to CT which showed no acute stroke. CTA was then performed which again showed no large vessel occlusion. Patient was not a tPA candidate secondary being on Heparin drip and elevated APTT. She was admitted for further evaluation and treatment.   SUBJECTIVE (INTERVAL HISTORY) Her husband is at the bedside.  Overall she feels her condition is gradually improving. She had f/u Ct this am which confirms acute right thalamic infarct OBJECTIVE Temp:  [98.1 F (36.7 C)-99 F (37.2 C)] 98.9 F (37.2 C) (03/11 0751) Pulse Rate:  [56-82] 70 (03/11 1000) Cardiac Rhythm:  [-] Normal sinus rhythm (03/11 0400) Resp:  [13-25] 18 (03/11 1000) BP: (86-179)/(51-125) 135/71 mmHg (03/11 1000) SpO2:  [90 %-99 %] 99 % (03/11 1000)  No results for input(s): GLUCAP in the last 168 hours.  Recent Labs Lab 01/18/15 1308 01/21/15 0505 01/21/15 1145  NA 140 141  --   K 3.8 3.8  --   CL 105 108  --   CO2 26 29  --   GLUCOSE 181* 105*  --   BUN 12 7  --   CREATININE 1.05 0.69 0.84  CALCIUM 8.6 7.9*  --     Recent Labs Lab 01/18/15 1308  AST 19  ALT 23  ALKPHOS 94  BILITOT 0.7  PROT 6.4  ALBUMIN 3.9    Recent Labs Lab 01/18/15 1308 01/21/15 0505 01/21/15 1145  WBC 9.1 12.1* 12.3*  NEUTROABS 5.1 8.6*  --   HGB 15.0 11.9* 12.7  HCT 43.2 34.8* 37.3  MCV 95.8 95.9 96.9  PLT 217 187 189   No results for input(s): CKTOTAL, CKMB, CKMBINDEX, TROPONINI in the last 168 hours. No results for input(s): LABPROT, INR in the last 72 hours.  Recent Labs  01/21/15 0820  COLORURINE YELLOW  LABSPEC 1.007  PHURINE 6.5  GLUCOSEU NEGATIVE  HGBUR TRACE*  BILIRUBINUR NEGATIVE  KETONESUR  NEGATIVE  PROTEINUR NEGATIVE  UROBILINOGEN 0.2  NITRITE NEGATIVE  LEUKOCYTESUR NEGATIVE       Component Value Date/Time   CHOL 116 01/22/2015 0000   TRIG 146 01/22/2015 0000   HDL 34* 01/22/2015 0000   CHOLHDL 3.4 01/22/2015 0000   VLDL 29 01/22/2015 0000   LDLCALC 53 01/22/2015 0000   Lab Results  Component Value Date   HGBA1C 6.0* 01/21/2015   No results found for: LABOPIA, COCAINSCRNUR, LABBENZ, AMPHETMU, THCU, LABBARB  No results for input(s): ETH in the last 168 hours.  Ct Angio Head W/cm &/or Wo Cm  01/20/2015   CLINICAL DATA:  Pipeline stent placed for left MCA aneurysm. Postoperative complaints of left arm and leg weakness.  EXAM: CT ANGIOGRAPHY HEAD AND NECK  TECHNIQUE: Multidetector CT imaging of the head and neck was performed using the standard protocol during bolus administration of intravenous contrast. Multiplanar CT image reconstructions and MIPs were obtained to evaluate the vascular anatomy. Carotid stenosis measurements (when applicable) are obtained utilizing NASCET criteria, using the distal internal carotid diameter as the denominator.  CONTRAST:  73mL OMNIPAQUE IOHEXOL 350 MG/ML SOLN  COMPARISON:  Angiography same day.  CTA 12/03/2014.  FINDINGS: CT HEAD  Brain: Previous core allowing upper right vertebral aneurysm. Previous clipping of the right MCA  aneurysm. Placement of pipeline stent in the left MCA region today. No evidence of intracranial hemorrhage. 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 No sign of acute infarction by CT.  Calvarium and skull base: Postoperative changes.  Nothing a acute.  Paranasal sinuses: Clear  CTA NECK  Aortic arch: There is advanced atherosclerotic disease of the arch with calcified plaque in either soft plaque or thrombus along the inferior margin of the arch. 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 Branching pattern of brachiocephalic vessels from the arch is normal  without origin stenosis.  Right carotid system: Right common carotid artery widely patent to the bifurcation. Ordinary mild atherosclerotic disease at the carotid bifurcation without stenosis or irregularity. Cervical internal carotid artery widely patent.  Left carotid system: Left common carotid artery affected by diffuse atherosclerotic disease. Narrowing in the proximal portion of 40%. Vessel patent to the bifurcation with mild atherosclerotic change at the bifurcation but no stenosis or irregularity.  Vertebral arteries:Right vertebral artery dominant. Both vertebral artery origins are widely patent. Both vessels are patent through the cervical region to the foramen magnum.  Skeleton: Ordinary cervical spondylosis  Other neck: No significant soft tissue lesion.  CTA HEAD  Anterior circulation: Both carotid siphon regions widely patent. On the right, the anterior and middle cerebral vessels are patent. There is artifact related to previous MCA aneurysm clipping. I do not see any missing vessels on the right compared to the previous study of 12/03/2014. On the left, pipeline stent is in place beginning at the M1 segment and extending into 1 of the left MCA branches. This spans the location of the fusiform aneurysm of the MCA branch, which measures 2.6 x 5.4 mm. No missing branch vessels are seen on the left. One could question if there is mild spasm of the MCA branch of medially beyond the and of the stent. Beyond that area, the vessel appears normal as it did before.  Posterior circulation: The right vertebral artery is a large vessel widely patent to the basilar. 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 The left vertebral artery is a small vessel that largely terminates in PICA. There is not definitely any flow beyond PICA to the basilar. The basilar artery shows mild atherosclerotic irregularity but no flow-limiting stenosis. Superior cerebellar and posterior cerebral  vessels are patent and normal.  Venous sinuses: Patent and normal.  Anatomic variants: No insignificant  Delayed phase: No significant finding  IMPRESSION: Pipeline stent placed in the left MCA spanning a left MCA aneurysm. No complications seen relative to that. No missing vessels. No hemorrhage. One could question mild spasm of the MCA branch just beyond the end of the stent, but the vessel is widely patent beyond that and appears as it did before.  Previously clipped right MCA region aneurysm. No missing vessels demonstrated on the right compared to the study of 12/03/2014.  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   Electronically Signed   By: Nelson Chimes M.D.   On: 01/20/2015 17:50   Ct Head Wo Contrast  01/22/2015   CLINICAL DATA:  Status post pipeline stent placed 01/20/2015, postoperative left arm end like weakness  EXAM: CT HEAD WITHOUT CONTRAST  TECHNIQUE: Contiguous axial images were obtained from the base of the skull through the vertex without intravenous contrast.  COMPARISON:  01/20/2015  FINDINGS: Postoperative changes are noted in the calvarium on the right consistent  with prior aneurysm clipping. There are changes consistent with aneurysm clipping lung course of the right middle cerebral artery as well as prior coiling along the course of the right posterior inferior cerebellar artery. These are stable from the prior exam. A new left middle cerebral artery pipeline stent is again noted.  A rounded area of decreased attenuation is noted within the right thalamus measuring approximately 13 mm. This has progressed in the interval from the prior exam consistent with an evolving area of ischemia. No other areas of acute hemorrhage, acute infarction or acute ischemia are noted.  IMPRESSION: Changes consistent with prior aneurysm coiling, clipping and stenting as described.  New rounded area of decreased attenuation in the right thalamus laterally consistent  with an evolving area of ischemia.  These results will be called to the ordering clinician or representative by the Radiologist Assistant, and communication documented in the PACS or zVision Dashboard.   Electronically Signed   By: Inez Catalina M.D.   On: 01/22/2015 07:33   Ct Head Wo Contrast  01/20/2015   CLINICAL DATA:  Pipeline stent placed for left MCA aneurysm. Postoperative complaints of left arm and leg weakness.  EXAM: CT ANGIOGRAPHY HEAD AND NECK  TECHNIQUE: Multidetector CT imaging of the head and neck was performed using the standard protocol during bolus administration of intravenous contrast. Multiplanar CT image reconstructions and MIPs were obtained to evaluate the vascular anatomy. Carotid stenosis measurements (when applicable) are obtained utilizing NASCET criteria, using the distal internal carotid diameter as the denominator.  CONTRAST:  69mL OMNIPAQUE IOHEXOL 350 MG/ML SOLN  COMPARISON:  Angiography same day.  CTA 12/03/2014.  FINDINGS: CT HEAD  Brain: Previous core allowing upper right vertebral aneurysm. Previous clipping of the right MCA aneurysm. Placement of pipeline stent in the left MCA region today. No evidence of intracranial hemorrhage. 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 No sign of acute infarction by CT.  Calvarium and skull base: Postoperative changes.  Nothing a acute.  Paranasal sinuses: Clear  CTA NECK  Aortic arch: There is advanced atherosclerotic disease of the arch with calcified plaque in either soft plaque or thrombus along the inferior margin of the arch. 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 Branching pattern of brachiocephalic vessels from the arch is normal without origin stenosis.  Right carotid system: Right common carotid artery widely patent to the bifurcation. Ordinary mild atherosclerotic disease at the carotid bifurcation without stenosis or irregularity. Cervical internal carotid  artery widely patent.  Left carotid system: Left common carotid artery affected by diffuse atherosclerotic disease. Narrowing in the proximal portion of 40%. Vessel patent to the bifurcation with mild atherosclerotic change at the bifurcation but no stenosis or irregularity.  Vertebral arteries:Right vertebral artery dominant. Both vertebral artery origins are widely patent. Both vessels are patent through the cervical region to the foramen magnum.  Skeleton: Ordinary cervical spondylosis  Other neck: No significant soft tissue lesion.  CTA HEAD  Anterior circulation: Both carotid siphon regions widely patent. On the right, the anterior and middle cerebral vessels are patent. There is artifact related to previous MCA aneurysm clipping. I do not see any missing vessels on the right compared to the previous study of 12/03/2014. On the left, pipeline stent is in place beginning at the M1 segment and extending into 1 of the left MCA branches. This spans the location of the fusiform aneurysm of the MCA branch, which measures 2.6 x  5.4 mm. No missing branch vessels are seen on the left. One could question if there is mild spasm of the MCA branch of medially beyond the and of the stent. Beyond that area, the vessel appears normal as it did before.  Posterior circulation: The right vertebral artery is a large vessel widely patent to the basilar. 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 The left vertebral artery is a small vessel that largely terminates in PICA. There is not definitely any flow beyond PICA to the basilar. The basilar artery shows mild atherosclerotic irregularity but no flow-limiting stenosis. Superior cerebellar and posterior cerebral vessels are patent and normal.  Venous sinuses: Patent and normal.  Anatomic variants: No insignificant  Delayed phase: No significant finding  IMPRESSION: Pipeline stent placed in the left MCA spanning a left MCA aneurysm. No  complications seen relative to that. No missing vessels. No hemorrhage. One could question mild spasm of the MCA branch just beyond the end of the stent, but the vessel is widely patent beyond that and appears as it did before.  Previously clipped right MCA region aneurysm. No missing vessels demonstrated on the right compared to the study of 12/03/2014.  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   Electronically Signed   By: Nelson Chimes M.D.   On: 01/20/2015 17:50   Ct Angio Neck W/cm &/or Wo/cm  01/20/2015   CLINICAL DATA:  Pipeline stent placed for left MCA aneurysm. Postoperative complaints of left arm and leg weakness.  EXAM: CT ANGIOGRAPHY HEAD AND NECK  TECHNIQUE: Multidetector CT imaging of the head and neck was performed using the standard protocol during bolus administration of intravenous contrast. Multiplanar CT image reconstructions and MIPs were obtained to evaluate the vascular anatomy. Carotid stenosis measurements (when applicable) are obtained utilizing NASCET criteria, using the distal internal carotid diameter as the denominator.  CONTRAST:  22mL OMNIPAQUE IOHEXOL 350 MG/ML SOLN  COMPARISON:  Angiography same day.  CTA 12/03/2014.  FINDINGS: CT HEAD  Brain: Previous core allowing upper right vertebral aneurysm. Previous clipping of the right MCA aneurysm. Placement of pipeline stent in the left MCA region today. No evidence of intracranial hemorrhage. 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 No sign of acute infarction by CT.  Calvarium and skull base: Postoperative changes.  Nothing a acute.  Paranasal sinuses: Clear  CTA NECK  Aortic arch: There is advanced atherosclerotic disease of the arch with calcified plaque in either soft plaque or thrombus along the inferior margin of the arch. 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 Branching pattern of brachiocephalic vessels from the arch  is normal without origin stenosis.  Right carotid system: Right common carotid artery widely patent to the bifurcation. Ordinary mild atherosclerotic disease at the carotid bifurcation without stenosis or irregularity. Cervical internal carotid artery widely patent.  Left carotid system: Left common carotid artery affected by diffuse atherosclerotic disease. Narrowing in the proximal portion of 40%. Vessel patent to the bifurcation with mild atherosclerotic change at the bifurcation but no stenosis or irregularity.  Vertebral arteries:Right vertebral artery dominant. Both vertebral artery origins are widely patent. Both vessels are patent through the cervical region to the foramen magnum.  Skeleton: Ordinary cervical spondylosis  Other neck: No significant soft tissue lesion.  CTA HEAD  Anterior circulation: Both carotid siphon regions widely patent. On the right, the anterior and middle cerebral vessels are patent. There is artifact  related to previous MCA aneurysm clipping. I do not see any missing vessels on the right compared to the previous study of 12/03/2014. On the left, pipeline stent is in place beginning at the M1 segment and extending into 1 of the left MCA branches. This spans the location of the fusiform aneurysm of the MCA branch, which measures 2.6 x 5.4 mm. No missing branch vessels are seen on the left. One could question if there is mild spasm of the MCA branch of medially beyond the and of the stent. Beyond that area, the vessel appears normal as it did before.  Posterior circulation: The right vertebral artery is a large vessel widely patent to the basilar. 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 The left vertebral artery is a small vessel that largely terminates in PICA. There is not definitely any flow beyond PICA to the basilar. The basilar artery shows mild atherosclerotic irregularity but no flow-limiting stenosis. Superior cerebellar and posterior  cerebral vessels are patent and normal.  Venous sinuses: Patent and normal.  Anatomic variants: No insignificant  Delayed phase: No significant finding  IMPRESSION: Pipeline stent placed in the left MCA spanning a left MCA aneurysm. No complications seen relative to that. No missing vessels. No hemorrhage. One could question mild spasm of the MCA branch just beyond the end of the stent, but the vessel is widely patent beyond that and appears as it did before.  Previously clipped right MCA region aneurysm. No missing vessels demonstrated on the right compared to the study of 12/03/2014.  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   Electronically Signed   By: Nelson Chimes M.D.   On: 01/20/2015 17:50   Ir Transcath/emboliz  01/22/2015   CLINICAL DATA:  Progressive headaches. History of multiple intracranial aneurysms. 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  EXAM: TRANSCATHETER THERAPY EMBOLIZATION OF LEFT MIDDLE CEREBRAL ARTERY REGION ANEURYSM  ANESTHESIA/SEDATION: General anesthesia.  MEDICATIONS: As per general anesthesia.  CONTRAST:  130mL OMNIPAQUE IOHEXOL 300 MG/ML  SOLN  PROCEDURE: Following a full explanation of the procedure along with the potential associated complications, an informed witnessed consent was obtained.  The right groin was prepped and draped in the usual sterile fashion. Thereafter using modified Seldinger technique, transfemoral access into right common femoral artery was obtained without difficulty. Over a 0.035 inch guidewire, a 5 French Pinnacle sheath was inserted. Through this, and also over a 0.035 inch guidewire, 5 French JB1 catheter was advanced to the aortic arch region and selectively positioned in the right common carotid artery, the right vertebral artery, and  the left common carotid artery. The patient tolerated the procedure well. A 3D rotational arteriogram was also performed of the left intracranial circulation from a left-sided common carotid artery injection. Reconstructions were performed on a separate workstation.  COMPLICATIONS: None immediate.  FINDINGS: The right common carotid arteriogram demonstrates the right external carotid artery and its major branches to be widely patent.  The right internal carotid artery at the bulb and its proximal one-third is patent.  There is a modest kink at the junction of the middle and the one-third of the right internal carotid artery without impedance of any blood flow distally.  More distally the vessel is seen to opacify normally to the cranial skull base.  The petrous, cavernous and supraclinoid segments are widely patent.  The right middle and the right anterior cerebral arteries are seen to opacify normally into the capillary and venous phases.  The right pericallosal artery is seen to cross the midline and supply the distal anterior cerebral artery distribution on the left.  Also seen are the previously positioned clips in the right middle cerebral artery distribution bifurcation. There is a mild fusiform prominence noted proximal to the clips at the inferior division of the right middle cerebral artery.  However, no angiographic change is noted from the previous catheter angiogram.  The vertebral artery origin is normal.  This is a dominant vertebral which it is seen to opacify to the cranial skull base. The previously endovascularly coiled right posterior-inferior cerebellar artery remains completely obliterated without evidence of recanalization or of coil compaction.  The right posterior-inferior cerebellar artery remains widely patent.  The distal basilar artery, the posterior cerebral arteries, the superior cerebellar arteries and the anterior inferior cerebellar arteries opacify normally into the capillary and  venous phases.  The left common carotid arteriogram demonstrates the left external carotid artery and its major branches to be normal.  The left internal carotid artery at the bulb to the cranial skull base opacifies normally.  The petrous, the cavernous and the supraclinoid segments are widely patent. The left middle cerebral artery and the left anterior cerebral artery are seen to opacify normally into the capillary and venous phases.  Arising in the region of the superior and the inferior divisions of the left middle cerebral artery again seen is a saccular outpouching projecting inferiorly and slightly anteriorly.  A 3D rotational arteriogram performed with reconstruction on a separate workstation confirms the presence of a wide neck lobulated aneurysm measuring approximately 5 mm x 4.5 mm. This is a wide neck which extends mostly into the origin of the inferior division.  ENDOVASCULAR STAGED EMBOLIZATION OF WIDE NECK LOBULATED LEFT MCA BIFURCATION ANEURYSM USING THE LVIS JR STENT.  The angiographic findings were reviewed with the patient and the patient's family. Informed consent was obtained. The patient was then put under general anesthesia by the Department of Anesthesiology at [facility] catheter in the left common carotid artery was then exchanged over a 0.035 inch 300 cm Constance Holster exchange guidewire for a 6 French 80 cm Cook shuttle sheath using biplane roadmap technique and constant fluoroscopic guidance. Good aspiration was obtained from the side port of the Tuohy-Borst at the hub of the [place] shuttle sheath. A gentle contrast injection demonstrated no evidence of spasms, dissections or intraluminal filling defects at the distal end of the guide catheter in the left common carotid artery. This was then connected to continuous heparinized saline infusion.  A 6 French 115 cm Navien guide catheter was then advanced just distal to the tip of the [place] shuttle sheath in the  distal left common carotid artery. The guidewire was removed. Good aspiration was then obtained from the hub of the 6 [place] Navien guide catheter. A gentle contrast injection revealed no evidence of spasms, dissections or of intraluminal filling defects.  Over a 0.035 inch Roadrunner guidewire, using biplane roadmap technique, the 6 [place] Navien guide catheter was then advanced to the distal cervical left ICA and positioned at the cervical petrous junction. The guidewire was removed. Good aspiration was obtained from the hub of the 6 [place] Navien guide catheter.  At this time in a coaxial manner and with constant heparinized saline infusion using biplane roadmap technique and constant fluoroscopic guidance, over a  0.014 inch Softip Synchro micro guidewire, a Headway 17 2 tip micro catheter which had been steam-shaped was then advanced to the distal end of the 6 [place] Navien guide catheter. The micro guidewire was then gently manipulated using a torque device into the left middle cerebral artery M1 segment followed by the micro catheter. The micro guidewire was then advanced and positioned just proximal to the level of the aneurysm neck at the origin of the inferior division of the left middle cerebral artery. Multiple attempts were then made to advance the micro guidewire with different distal configurations into the inferior divisions. All of these were unsuccessful.  The micro catheter was then advanced to the M2 M3 region of the superior division of the left middle cerebral artery. The guidewire was removed. Good aspiration was obtained from the hub of the Headway micro catheter. This was then connected to continuous heparinized saline infusion.  Through the second port of the Tuohy-Borst at the hub of the 6 [place] Navien guide catheter, an SL 10 2-tip micro catheter with a 45 degree angulation was then advanced over 0.0148 inch Softip Synchro micro guidewire to the distal end of the 6 [place] Navien guide  catheter in the left internal carotid artery. With the micro guidewire leading with a J-tip configuration, the combination was then advanced to the left middle cerebral artery M1 segment. The micro guidewire was then advanced into the aneurysm followed by the SL 10 45 degrees angle micro catheter. The micro guidewire was advanced within the aneurysm under constant fluoroscopic guidance gingerly such that the tip of the wire was now pointing into the origin of the inferior division of the left middle cerebral artery. This was then followed by the micro catheter. The micro guidewire was then gently manipulated with access obtained into the inferior division. The wire was then advanced distally into the inferior division followed by the SL 10 micro catheter carefully. There was free advancement of the micro catheter to the M3 region of the left middle cerebral artery inferior division. The micro guidewire was then removed. Good aspiration was obtained from the hub of the SL 10 micro catheter. A gentle contrast injection demonstrated antegrade flow distally. This was then connected to continuous heparinized saline infusion.  At this time a 2.5 mm x 34 mm LVIS Jr stent with a working length of 30 mm was then advanced in a coaxial manner and with constant heparinized saline infusion using biplane roadmap technique and constant fluoroscopic guidance to the distal end of the SL 10 micro catheter. The O ring on the delivery micro guidewire and the delivery micro catheter were then gently loosened. With slight forward gentle traction with the right hand on the delivery micro guidewire, with the left hand the SL 10 micro catheter was gently retrieved in a controlled gradual slow fashion unsheathing the LVIS stent.  This was continued until the entire stent was deployed. A control arteriogram performed through the 6 [place] Navien guide catheter demonstrated excellent apposition in the left middle cerebral artery M1 segment and  the M2 region of the inferior division of the left middle cerebral artery. However, there was significant narrowing noted in the stent just distal to its entry into the inferior division.  This was then subsequently dilated with a 0.012 inch J-tipped Headliner micro guidewire which was advanced to the focal narrowing within the stent followed by the micro catheter. The micro guidewire was then gingerly advanced to and fro at the site of the superior stenosis.  The wire was then retrieved proximally as was the micro catheter. A control arteriogram performed through the 6 [place] Navien guide catheter demonstrated significantly improved caliber of the stent at this point with excellent flow into the vessels.  Control arteriograms were then performed at 15, 30 and 40 minutes post stent deployment. These continued to demonstrate excellent flow. There was suspicion of a small filling defect along the distal aspect of the LVIS stent. This prompted the use of approximately 4 mg of superselective intracranial Integrelin over about 4 minutes.  Control arteriograms continued to demonstrate excellent flow with opening of the LVIS stent within the aneurysm itself.  A final control arteriogram performed through the 6 [place] Navien guide catheter continued to demonstrate excellent flow without any intraluminal filling defects.  This was then retrieved into the abdominal aorta along with the [place] shuttle sheath. This was then exchanged out for a 6 [place] Pinnacle sheath over a 0.035 inch guidewire. The sheath itself was then removed with the successful application of an external closure device.  The patient's ACT throughout the procedure was maintained in the region of 180 seconds.  No acute neurological or hemodynamic changes were seen throughout the procedure.  The patient's general anesthesia was then reversed and the patient was extubated. Upon recovery the patient demonstrated no new neurological signs or symptoms.  Memory and orientation remained stable as did her motor, sensory and coordination function. She was then transferred to the neuro ICU to continue her IV heparin and to control her blood pressure on IV Cardene within the parameters.  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   Electronically Signed   By: Luanne Bras M.D.   On: 01/21/2015 13:30   Ir Angiogram Follow Up Study  01/22/2015   CLINICAL DATA:  Progressive headaches. History of multiple intracranial aneurysms. 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  EXAM: TRANSCATHETER THERAPY EMBOLIZATION OF LEFT MIDDLE CEREBRAL ARTERY REGION ANEURYSM  ANESTHESIA/SEDATION: General anesthesia.  MEDICATIONS: As per general anesthesia.  CONTRAST:  165mL OMNIPAQUE IOHEXOL 300 MG/ML  SOLN  PROCEDURE: Following a full explanation of the procedure along with the potential associated complications, an informed witnessed consent was obtained.  The right groin was prepped and draped in the usual sterile fashion. Thereafter using modified Seldinger technique, transfemoral access into right common femoral artery was obtained without difficulty. Over a 0.035 inch guidewire, a 5 French Pinnacle sheath was inserted. Through this, and also over a 0.035 inch guidewire, 5 French JB1 catheter was advanced to the aortic arch region and selectively positioned in the right common carotid artery, the right vertebral artery, and the left common carotid artery. The patient tolerated the procedure well. A 3D rotational arteriogram was also performed of the left intracranial circulation from a left-sided common carotid artery injection. Reconstructions were  performed on a separate workstation.  COMPLICATIONS: None immediate.  FINDINGS: The right common carotid arteriogram demonstrates the right external carotid artery and its major branches to be widely patent.  The right internal carotid artery at the bulb and its proximal one-third is patent.  There is a modest kink at the junction of the middle and the one-third of the right internal carotid artery without impedance of any blood flow distally.  More distally the vessel is seen to opacify normally to the cranial skull base.  The petrous, cavernous and supraclinoid segments are widely patent.  The right middle and the right anterior cerebral arteries are seen to opacify normally into the capillary and venous phases.  The right pericallosal artery is seen to cross the midline and supply the distal anterior cerebral artery distribution on the left.  Also seen are the previously positioned clips in the right middle cerebral artery distribution bifurcation. There is a mild fusiform prominence noted proximal to the clips at the inferior division of the right middle cerebral artery.  However, no angiographic change is noted from the previous catheter angiogram.  The vertebral artery origin is normal.  This is a dominant vertebral which it is seen to opacify to the cranial skull base. The previously endovascularly coiled right posterior-inferior cerebellar artery remains completely obliterated without evidence of recanalization or of coil compaction.  The right posterior-inferior cerebellar artery remains widely patent.  The distal basilar artery, the posterior cerebral arteries, the superior cerebellar arteries and the anterior inferior cerebellar arteries opacify normally into the capillary and venous phases.  The left common carotid arteriogram demonstrates the left external carotid artery and its major branches to be normal.  The left internal carotid artery at the bulb to the cranial skull base opacifies normally.  The  petrous, the cavernous and the supraclinoid segments are widely patent. The left middle cerebral artery and the left anterior cerebral artery are seen to opacify normally into the capillary and venous phases.  Arising in the region of the superior and the inferior divisions of the left middle cerebral artery again seen is a saccular outpouching projecting inferiorly and slightly anteriorly.  A 3D rotational arteriogram performed with reconstruction on a separate workstation confirms the presence of a wide neck lobulated aneurysm measuring approximately 5 mm x 4.5 mm. This is a wide neck which extends mostly into the origin of the inferior division.  ENDOVASCULAR STAGED EMBOLIZATION OF WIDE NECK LOBULATED LEFT MCA BIFURCATION ANEURYSM USING THE LVIS JR STENT.  The angiographic findings were reviewed with the patient and the patient's family. Informed consent was obtained. The patient was then put under general anesthesia by the Department of Anesthesiology at [facility] catheter in the left common carotid artery was then exchanged over a 0.035 inch 300 cm Constance Holster exchange guidewire for a 6 French 80 cm Cook shuttle sheath using biplane roadmap technique and constant fluoroscopic guidance. Good aspiration was obtained from the side port of the Tuohy-Borst at the hub of the [place] shuttle sheath. A gentle contrast injection demonstrated no evidence of spasms, dissections or intraluminal filling defects at the distal end of the guide catheter in the left common carotid artery. This was then connected to continuous heparinized saline infusion.  A 6 French 115 cm Navien guide catheter was then advanced just distal to the tip of the [place] shuttle sheath in the distal left common carotid artery. The guidewire was removed. Good aspiration was then obtained from the hub of the 6 [place] Navien guide catheter. A gentle contrast injection revealed no evidence of spasms, dissections or of  intraluminal filling defects.  Over a 0.035 inch Roadrunner guidewire, using biplane roadmap technique, the 6 [place] Navien guide catheter was then advanced to the distal cervical left ICA and positioned at the cervical petrous junction. The guidewire was removed. Good aspiration was obtained from the hub of the 6 [place] Navien guide  catheter.  At this time in a coaxial manner and with constant heparinized saline infusion using biplane roadmap technique and constant fluoroscopic guidance, over a 0.014 inch Softip Synchro micro guidewire, a Headway 17 2 tip micro catheter which had been steam-shaped was then advanced to the distal end of the 6 [place] Navien guide catheter. The micro guidewire was then gently manipulated using a torque device into the left middle cerebral artery M1 segment followed by the micro catheter. The micro guidewire was then advanced and positioned just proximal to the level of the aneurysm neck at the origin of the inferior division of the left middle cerebral artery. Multiple attempts were then made to advance the micro guidewire with different distal configurations into the inferior divisions. All of these were unsuccessful.  The micro catheter was then advanced to the M2 M3 region of the superior division of the left middle cerebral artery. The guidewire was removed. Good aspiration was obtained from the hub of the Headway micro catheter. This was then connected to continuous heparinized saline infusion.  Through the second port of the Tuohy-Borst at the hub of the 6 [place] Navien guide catheter, an SL 10 2-tip micro catheter with a 45 degree angulation was then advanced over 0.0148 inch Softip Synchro micro guidewire to the distal end of the 6 [place] Navien guide catheter in the left internal carotid artery. With the micro guidewire leading with a J-tip configuration, the combination was then advanced to the left middle cerebral artery M1 segment. The micro guidewire was then advanced  into the aneurysm followed by the SL 10 45 degrees angle micro catheter. The micro guidewire was advanced within the aneurysm under constant fluoroscopic guidance gingerly such that the tip of the wire was now pointing into the origin of the inferior division of the left middle cerebral artery. This was then followed by the micro catheter. The micro guidewire was then gently manipulated with access obtained into the inferior division. The wire was then advanced distally into the inferior division followed by the SL 10 micro catheter carefully. There was free advancement of the micro catheter to the M3 region of the left middle cerebral artery inferior division. The micro guidewire was then removed. Good aspiration was obtained from the hub of the SL 10 micro catheter. A gentle contrast injection demonstrated antegrade flow distally. This was then connected to continuous heparinized saline infusion.  At this time a 2.5 mm x 34 mm LVIS Jr stent with a working length of 30 mm was then advanced in a coaxial manner and with constant heparinized saline infusion using biplane roadmap technique and constant fluoroscopic guidance to the distal end of the SL 10 micro catheter. The O ring on the delivery micro guidewire and the delivery micro catheter were then gently loosened. With slight forward gentle traction with the right hand on the delivery micro guidewire, with the left hand the SL 10 micro catheter was gently retrieved in a controlled gradual slow fashion unsheathing the LVIS stent.  This was continued until the entire stent was deployed. A control arteriogram performed through the 6 [place] Navien guide catheter demonstrated excellent apposition in the left middle cerebral artery M1 segment and the M2 region of the inferior division of the left middle cerebral artery. However, there was significant narrowing noted in the stent just distal to its entry into the inferior division.  This was then subsequently dilated  with a 0.012 inch J-tipped Headliner micro guidewire which was advanced to the focal narrowing  within the stent followed by the micro catheter. The micro guidewire was then gingerly advanced to and fro at the site of the superior stenosis. The wire was then retrieved proximally as was the micro catheter. A control arteriogram performed through the 6 [place] Navien guide catheter demonstrated significantly improved caliber of the stent at this point with excellent flow into the vessels.  Control arteriograms were then performed at 15, 30 and 40 minutes post stent deployment. These continued to demonstrate excellent flow. There was suspicion of a small filling defect along the distal aspect of the LVIS stent. This prompted the use of approximately 4 mg of superselective intracranial Integrelin over about 4 minutes.  Control arteriograms continued to demonstrate excellent flow with opening of the LVIS stent within the aneurysm itself.  A final control arteriogram performed through the 6 [place] Navien guide catheter continued to demonstrate excellent flow without any intraluminal filling defects.  This was then retrieved into the abdominal aorta along with the [place] shuttle sheath. This was then exchanged out for a 6 [place] Pinnacle sheath over a 0.035 inch guidewire. The sheath itself was then removed with the successful application of an external closure device.  The patient's ACT throughout the procedure was maintained in the region of 180 seconds.  No acute neurological or hemodynamic changes were seen throughout the procedure.  The patient's general anesthesia was then reversed and the patient was extubated. Upon recovery the patient demonstrated no new neurological signs or symptoms. Memory and orientation remained stable as did her motor, sensory and coordination function. She was then transferred to the neuro ICU to continue her IV heparin and to control her blood pressure on IV Cardene within the  parameters.  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   Electronically Signed   By: Luanne Bras M.D.   On: 01/21/2015 13:30   Ir 3d Primitivo Gauze [facility]reld Mclean  01/22/2015   CLINICAL DATA:  Progressive headaches. History of multiple intracranial aneurysms. 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  EXAM: TRANSCATHETER THERAPY EMBOLIZATION OF LEFT MIDDLE CEREBRAL ARTERY REGION ANEURYSM  ANESTHESIA/SEDATION: General anesthesia.  MEDICATIONS: As per general anesthesia.  CONTRAST:  126mL OMNIPAQUE IOHEXOL 300 MG/ML  SOLN  PROCEDURE: Following a full explanation of the procedure along with the potential associated complications, an informed witnessed consent was obtained.  The right groin was prepped and draped in the usual sterile fashion. Thereafter using modified Seldinger technique, transfemoral access into right common femoral artery was obtained without difficulty. Over a 0.035 inch guidewire, a 5 French Pinnacle sheath was inserted. Through this, and also over a 0.035 inch guidewire, 5 French JB1 catheter was advanced to the aortic arch region and selectively positioned in the right common carotid artery, the right vertebral artery, and the left common carotid artery. The patient tolerated the procedure well. A 3D rotational arteriogram was also performed of the left intracranial circulation from a left-sided common carotid artery injection. Reconstructions were performed on a separate workstation.  COMPLICATIONS: None immediate.  FINDINGS: The right common carotid arteriogram demonstrates the right external carotid artery and its major branches to be widely patent.  The right internal  carotid artery at the bulb and its proximal one-third is patent.  There is a modest  kink at the junction of the middle and the one-third of the right internal carotid artery without impedance of any blood flow distally.  More distally the vessel is seen to opacify normally to the cranial skull base.  The petrous, cavernous and supraclinoid segments are widely patent.  The right middle and the right anterior cerebral arteries are seen to opacify normally into the capillary and venous phases.  The right pericallosal artery is seen to cross the midline and supply the distal anterior cerebral artery distribution on the left.  Also seen are the previously positioned clips in the right middle cerebral artery distribution bifurcation. There is a mild fusiform prominence noted proximal to the clips at the inferior division of the right middle cerebral artery.  However, no angiographic change is noted from the previous catheter angiogram.  The vertebral artery origin is normal.  This is a dominant vertebral which it is seen to opacify to the cranial skull base. The previously endovascularly coiled right posterior-inferior cerebellar artery remains completely obliterated without evidence of recanalization or of coil compaction.  The right posterior-inferior cerebellar artery remains widely patent.  The distal basilar artery, the posterior cerebral arteries, the superior cerebellar arteries and the anterior inferior cerebellar arteries opacify normally into the capillary and venous phases.  The left common carotid arteriogram demonstrates the left external carotid artery and its major branches to be normal.  The left internal carotid artery at the bulb to the cranial skull base opacifies normally.  The petrous, the cavernous and the supraclinoid segments are widely patent. The left middle cerebral artery and the left anterior cerebral artery are seen to opacify normally into the capillary and venous phases.  Arising in the  region of the superior and the inferior divisions of the left middle cerebral artery again seen is a saccular outpouching projecting inferiorly and slightly anteriorly.  A 3D rotational arteriogram performed with reconstruction on a separate workstation confirms the presence of a wide neck lobulated aneurysm measuring approximately 5 mm x 4.5 mm. This is a wide neck which extends mostly into the origin of the inferior division.  ENDOVASCULAR STAGED EMBOLIZATION OF WIDE NECK LOBULATED LEFT MCA BIFURCATION ANEURYSM USING THE LVIS JR STENT.  The angiographic findings were reviewed with the patient and the patient's family. Informed consent was obtained. The patient was then put under general anesthesia by the Department of Anesthesiology at [place] catheter in the left common carotid artery was then exchanged over a 0.035 inch 300 cm Constance Holster exchange guidewire for a 6 French 80 cm [place] shuttle sheath using biplane roadmap technique and constant fluoroscopic guidance. Good aspiration was obtained from the side port of the Tuohy-Borst at the hub of the [place] shuttle sheath. A gentle contrast injection demonstrated no evidence of spasms, dissections or intraluminal filling defects at the distal end of the guide catheter in the left common carotid artery. This was then connected to continuous heparinized saline infusion.  A 6 French 115 cm Navien guide catheter was then advanced just distal to the tip of the [place] shuttle sheath in the distal left common carotid artery. The guidewire was removed. Good aspiration was then obtained from the hub of the 6 [place] Navien guide catheter. A gentle contrast injection revealed no evidence of spasms, dissections or of intraluminal filling defects.  Over a 0.035 inch Roadrunner guidewire, using biplane roadmap technique, the 6 [place] Navien guide catheter was then advanced to the distal cervical left ICA and  positioned at the cervical petrous  junction. The guidewire was removed. Good aspiration was obtained from the hub of the 6 [place] Navien guide catheter.  At this time in a coaxial manner and with constant heparinized saline infusion using biplane roadmap technique and constant fluoroscopic guidance, over a 0.014 inch Softip Synchro micro guidewire, a Headway 17 2 tip micro catheter which had been steam-shaped was then advanced to the distal end of the 6 [place] Navien guide catheter. The micro guidewire was then gently manipulated using a torque device into the left middle cerebral artery M1 segment followed by the micro catheter. The micro guidewire was then advanced and positioned just proximal to the level of the aneurysm neck at the origin of the inferior division of the left middle cerebral artery. Multiple attempts were then made to advance the micro guidewire with different distal configurations into the inferior divisions. All of these were unsuccessful.  The micro catheter was then advanced to the M2 M3 region of the superior division of the left middle cerebral artery. The guidewire was removed. Good aspiration was obtained from the hub of the Headway micro catheter. This was then connected to continuous heparinized saline infusion.  Through the second port of the Tuohy-Borst at the hub of the 6 [place] Navien guide catheter, an SL 10 2-tip micro catheter with a 45 degree angulation was then advanced over 0.0148 inch Softip Synchro micro guidewire to the distal end of the 6 [place] Navien guide catheter in the left internal carotid artery. With the micro guidewire leading with a J-tip configuration, the combination was then advanced to the left middle cerebral artery M1 segment. The micro guidewire was then advanced into the aneurysm followed by the SL 10 45 degrees angle micro catheter. The micro guidewire was advanced within the aneurysm under constant fluoroscopic guidance gingerly such that the tip of the wire was now pointing into the  origin of the inferior division of the left middle cerebral artery. This was then followed by the micro catheter. The micro guidewire was then gently manipulated with access obtained into the inferior division. The wire was then advanced distally into the inferior division followed by the SL 10 micro catheter carefully. There was free advancement of the micro catheter to the M3 region of the left middle cerebral artery inferior division. The micro guidewire was then removed. Good aspiration was obtained from the hub of the SL 10 micro catheter. A gentle contrast injection demonstrated antegrade flow distally. This was then connected to continuous heparinized saline infusion.  At this time a 2.5 mm x 34 mm LVIS Jr stent with a working length of 30 mm was then advanced in a coaxial manner and with constant heparinized saline infusion using biplane roadmap technique and constant fluoroscopic guidance to the distal end of the SL 10 micro catheter. The O ring on the delivery micro guidewire and the delivery micro catheter were then gently loosened. With slight forward gentle traction with the right hand on the delivery micro guidewire, with the left hand the SL 10 micro catheter was gently retrieved in a controlled gradual slow fashion unsheathing the LVIS stent.  This was continued until the entire stent was deployed. A control arteriogram performed through the 6 [place] Navien guide catheter demonstrated excellent apposition in the left middle cerebral artery M1 segment and the M2 region of the inferior division of the left middle cerebral artery. However, there was significant narrowing noted in the stent just distal to its entry into the  inferior division.  This was then subsequently dilated with a 0.012 inch J-tipped Headliner micro guidewire which was advanced to the focal narrowing within the stent followed by the micro catheter. The micro guidewire was then gingerly advanced to and fro at the site of the superior  stenosis. The wire was then retrieved proximally as was the micro catheter. A control arteriogram performed through the 6 [place] Navien guide catheter demonstrated significantly improved caliber of the stent at this point with excellent flow into the vessels.  Control arteriograms were then performed at 15, 30 and 40 minutes post stent deployment. These continued to demonstrate excellent flow. There was suspicion of a small filling defect along the distal aspect of the LVIS stent. This prompted the use of approximately 4 mg of superselective intracranial Integrelin over about 4 minutes.  Control arteriograms continued to demonstrate excellent flow with opening of the LVIS stent within the aneurysm itself.  A final control arteriogram performed through the 6 [place] Navien guide catheter continued to demonstrate excellent flow without any intraluminal filling defects.  This was then retrieved into the abdominal aorta along with the [place] shuttle sheath. This was then exchanged out for a 6 [place] Pinnacle sheath over a 0.035 inch guidewire. The sheath itself was then removed with the successful application of an external closure device.  The patient's ACT throughout the procedure was maintained in the region of 180 seconds.  No acute neurological or hemodynamic changes were seen throughout the procedure.  The patient's general anesthesia was then reversed and the patient was extubated. Upon recovery the patient demonstrated no new neurological signs or symptoms. Memory and orientation remained stable as did her motor, sensory and coordination function. She was then transferred to the neuro ICU to continue her IV heparin and to control her blood pressure on IV Cardene within the parameters.  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   Electronically Signed    By: Luanne Bras M.D.   On: 01/21/2015 13:30   Ir US Guide Vasc Access Right  01/22/2015   CLINICAL DATA:  Progressive headaches. History of multiple intracranial aneurysms. 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  EXAM: TRANSCATHETER THERAPY EMBOLIZATION OF LEFT MIDDLE CEREBRAL ARTERY REGION ANEURYSM  ANESTHESIA/SEDATION: General anesthesia.  MEDICATIONS: As per general anesthesia.  CONTRAST:  150mL OMNIPAQUE IOHEXOL 300 MG/ML  SOLN  PROCEDURE: Following a full explanation of the procedure along with the potential associated complications, an informed witnessed consent was obtained.  The right groin was prepped and draped in the usual sterile fashion. Thereafter using modified Seldinger technique, transfemoral access into right common femoral artery was obtained without difficulty. Over a 0.035 inch guidewire, a 5 French Pinnacle sheath was inserted. Through this, and also over a 0.035 inch guidewire, 5 French JB1 catheter was advanced to the aortic arch region and selectively positioned in the right common carotid artery, the right vertebral artery, and the left common carotid artery. The patient tolerated the procedure well. A 3D rotational arteriogram was also performed of the left intracranial circulation from a left-sided common carotid artery injection. Reconstructions were performed on a separate workstation.  COMPLICATIONS: None immediate.  FINDINGS: The right common carotid arteriogram demonstrates the right external carotid artery and its major  branches to be widely patent.  The right internal carotid artery at the bulb and its proximal one-third is patent.  There is a modest kink at the junction of the middle and the one-third of the right internal carotid artery without impedance of any blood flow distally.  More  distally the vessel is seen to opacify normally to the cranial skull base.  The petrous, cavernous and supraclinoid segments are widely patent.  The right middle and the right anterior cerebral arteries are seen to opacify normally into the capillary and venous phases.  The right pericallosal artery is seen to cross the midline and supply the distal anterior cerebral artery distribution on the left.  Also seen are the previously positioned clips in the right middle cerebral artery distribution bifurcation. There is a mild fusiform prominence noted proximal to the clips at the inferior division of the right middle cerebral artery.  However, no angiographic change is noted from the previous catheter angiogram.  The vertebral artery origin is normal.  This is a dominant vertebral which it is seen to opacify to the cranial skull base. The previously endovascularly coiled right posterior-inferior cerebellar artery remains completely obliterated without evidence of recanalization or of coil compaction.  The right posterior-inferior cerebellar artery remains widely patent.  The distal basilar artery, the posterior cerebral arteries, the superior cerebellar arteries and the anterior inferior cerebellar arteries opacify normally into the capillary and venous phases.  The left common carotid arteriogram demonstrates the left external carotid artery and its major branches to be normal.  The left internal carotid artery at the bulb to the cranial skull base opacifies normally.  The petrous, the cavernous and the supraclinoid segments are widely patent. The left middle cerebral artery and the left anterior cerebral artery are seen to opacify normally into the capillary and venous phases.  Arising in the region of the superior and the inferior divisions of the left middle cerebral artery again seen is a saccular outpouching projecting inferiorly and slightly anteriorly.  A 3D rotational arteriogram performed with reconstruction  on a separate workstation confirms the presence of a wide neck lobulated aneurysm measuring approximately 5 mm x 4.5 mm. This is a wide neck which extends mostly into the origin of the inferior division.  ENDOVASCULAR STAGED EMBOLIZATION OF WIDE NECK LOBULATED LEFT MCA BIFURCATION ANEURYSM USING THE LVIS JR STENT.  The angiographic findings were reviewed with the patient and the patient's family. Informed consent was obtained. The patient was then put under general anesthesia by the Department of Anesthesiology at [place] catheter in the left common carotid artery was then exchanged over a 0.035 inch 300 cm Constance Holster exchange guidewire for a 6 French 80 cm Cook shuttle sheath using biplane roadmap technique and constant fluoroscopic guidance. Good aspiration was obtained from the side port of the Tuohy-Borst at the hub of the [place] shuttle sheath. A gentle contrast injection demonstrated no evidence of spasms, dissections or intraluminal filling defects at the distal end of the guide catheter in the left common carotid artery. This was then connected to continuous heparinized saline infusion.  A 6 French 115 cm Navien guide catheter was then advanced just distal to the tip of the [place] shuttle sheath in the distal left common carotid artery. The guidewire was removed. Good aspiration was then obtained from the hub of the 6 [place] Navien guide catheter. A gentle contrast injection revealed no evidence of spasms, dissections or of intraluminal filling defects.  Over a 0.035 inch Roadrunner guidewire, using biplane roadmap technique, the 6 [place] Navien guide catheter was then advanced to the distal cervical left ICA and positioned at the cervical petrous junction. The guidewire was removed. Good aspiration was obtained from the hub of the 6 [place] Navien guide catheter.  At this time in a coaxial manner and with constant heparinized saline infusion using biplane roadmap technique  and constant fluoroscopic guidance, over a 0.014 inch Softip Synchro micro guidewire, a Headway 17 2 tip micro catheter which had been steam-shaped was then advanced to the distal end of the 6 [place] Navien guide catheter. The micro guidewire was then gently manipulated using a torque device into the left middle cerebral artery M1 segment followed by the micro catheter. The micro guidewire was then advanced and positioned just proximal to the level of the aneurysm neck at the origin of the inferior division of the left middle cerebral artery. Multiple attempts were then made to advance the micro guidewire with different distal configurations into the inferior divisions. All of these were unsuccessful.  The micro catheter was then advanced to the M2 M3 region of the superior division of the left middle cerebral artery. The guidewire was removed. Good aspiration was obtained from the hub of the Headway micro catheter. This was then connected to continuous heparinized saline infusion.  Through the second port of the Tuohy-Borst at the hub of the 6 [place] Navien guide catheter, an SL 10 2-tip micro catheter with a 45 degree angulation was then advanced over 0.0148 inch Softip Synchro micro guidewire to the distal end of the 6 [place] Navien guide catheter in the left internal carotid artery. With the micro guidewire leading with a J-tip configuration, the combination was then advanced to the left middle cerebral artery M1 segment. The micro guidewire was then advanced into the aneurysm followed by the SL 10 45 degrees angle micro catheter. The micro guidewire was advanced within the aneurysm under constant fluoroscopic guidance gingerly such that the tip of the wire was now pointing into the origin of the inferior division of the left middle cerebral artery. This was then followed by the micro catheter. The micro guidewire was then gently manipulated with access obtained into the inferior division. The wire was then  advanced distally into the inferior division followed by the SL 10 micro catheter carefully. There was free advancement of the micro catheter to the M3 region of the left middle cerebral artery inferior division. The micro guidewire was then removed. Good aspiration was obtained from the hub of the SL 10 micro catheter. A gentle contrast injection demonstrated antegrade flow distally. This was then connected to continuous heparinized saline infusion.  At this time a 2.5 mm x 34 mm LVIS Jr stent with a working length of 30 mm was then advanced in a coaxial manner and with constant heparinized saline infusion using biplane roadmap technique and constant fluoroscopic guidance to the distal end of the SL 10 micro catheter. The O ring on the delivery micro guidewire and the delivery micro catheter were then gently loosened. With slight forward gentle traction with the right hand on the delivery micro guidewire, with the left hand the SL 10 micro catheter was gently retrieved in a controlled gradual slow fashion unsheathing the LVIS stent.  This was continued until the entire stent was deployed. A control arteriogram performed through the 6 [place] Navien guide catheter demonstrated excellent apposition in the left middle cerebral artery M1 segment and the M2 region  of the inferior division of the left middle cerebral artery. However, there was significant narrowing noted in the stent just distal to its entry into the inferior division.  This was then subsequently dilated with a 0.012 inch J-tipped Headliner micro guidewire which was advanced to the focal narrowing within the stent followed by the micro catheter. The micro guidewire was then gingerly advanced to and fro at the site of the superior stenosis. The wire was then retrieved proximally as was the micro catheter. A control arteriogram performed through the 6 [place] Navien guide catheter demonstrated significantly improved caliber of the stent at this point with  excellent flow into the vessels.  Control arteriograms were then performed at 15, 30 and 40 minutes post stent deployment. These continued to demonstrate excellent flow. There was suspicion of a small filling defect along the distal aspect of the LVIS stent. This prompted the use of approximately 4 mg of superselective intracranial Integrelin over about 4 minutes.  Control arteriograms continued to demonstrate excellent flow with opening of the LVIS stent within the aneurysm itself.  A final control arteriogram performed through the 6 [place] Navien guide catheter continued to demonstrate excellent flow without any intraluminal filling defects.  This was then retrieved into the abdominal aorta along with the [place] shuttle sheath. This was then exchanged out for a 6 [place] Pinnacle sheath over a 0.035 inch guidewire. The sheath itself was then removed with the successful application of an external closure device.  The patient's ACT throughout the procedure was maintained in the region of 180 seconds.  No acute neurological or hemodynamic changes were seen throughout the procedure.  The patient's general anesthesia was then reversed and the patient was extubated. Upon recovery the patient demonstrated no new neurological signs or symptoms. Memory and orientation remained stable as did her motor, sensory and coordination function. She was then transferred to the neuro ICU to continue her IV heparin and to control her blood pressure on IV Cardene within the parameters.  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   Electronically Signed   By: Luanne Bras M.D.   On: 01/21/2015 13:30   Ir Angio Intra Extracran Sel Com Carotid Innominate Uni R Mod Sed  01/22/2015   CLINICAL DATA:  Progressive headaches. History of multiple intracranial aneurysms. Previous  clipping of a right middle cerebral artery region aneurysm. 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  EXAM: TRANSCATHETER THERAPY EMBOLIZATION OF LEFT MIDDLE CEREBRAL ARTERY REGION ANEURYSM  ANESTHESIA/SEDATION: General anesthesia.  MEDICATIONS: As per general anesthesia.  CONTRAST:  162mL OMNIPAQUE IOHEXOL 300 MG/ML  SOLN  PROCEDURE: Following a full explanation of the procedure along with the potential associated complications, an informed witnessed consent was obtained.  The right groin was prepped and draped in the usual sterile fashion. Thereafter using modified Seldinger technique, transfemoral access into right common femoral artery was obtained without difficulty. Over a 0.035 inch guidewire, a 5 French Pinnacle sheath was inserted. Through this, and also over a 0.035 inch guidewire, 5 French JB1 catheter was advanced to the aortic arch region and selectively positioned in the right common carotid artery, the right vertebral artery, and the left common carotid artery. The patient tolerated the procedure well. A 3D rotational arteriogram was also performed of the left intracranial circulation from a  left-sided common carotid artery injection. Reconstructions were performed on a separate workstation.  COMPLICATIONS: None immediate.  FINDINGS: The right common carotid arteriogram demonstrates the right external carotid artery and its major branches to be widely patent.  The right internal carotid artery at the bulb and its proximal one-third is patent.  There is a modest kink at the junction of the middle and the one-third of the right internal carotid artery without impedance of any blood flow distally.  More distally the vessel is seen to opacify normally to the cranial skull base.  The petrous, cavernous and supraclinoid segments are widely patent.  The right middle and the right anterior cerebral  arteries are seen to opacify normally into the capillary and venous phases.  The right pericallosal artery is seen to cross the midline and supply the distal anterior cerebral artery distribution on the left.  Also seen are the previously positioned clips in the right middle cerebral artery distribution bifurcation. There is a mild fusiform prominence noted proximal to the clips at the inferior division of the right middle cerebral artery.  However, no angiographic change is noted from the previous catheter angiogram.  The vertebral artery origin is normal.  This is a dominant vertebral which it is seen to opacify to the cranial skull base. The previously endovascularly coiled right posterior-inferior cerebellar artery remains completely obliterated without evidence of recanalization or of coil compaction.  The right posterior-inferior cerebellar artery remains widely patent.  The distal basilar artery, the posterior cerebral arteries, the superior cerebellar arteries and the anterior inferior cerebellar arteries opacify normally into the capillary and venous phases.  The left common carotid arteriogram demonstrates the left external carotid artery and its major branches to be normal.  The left internal carotid artery at the bulb to the cranial skull base opacifies normally.  The petrous, the cavernous and the supraclinoid segments are widely patent. The left middle cerebral artery and the left anterior cerebral artery are seen to opacify normally into the capillary and venous phases.  Arising in the region of the superior and the inferior divisions of the left middle cerebral artery again seen is a saccular outpouching projecting inferiorly and slightly anteriorly.  A 3D rotational arteriogram performed with reconstruction on a separate workstation confirms the presence of a wide neck lobulated aneurysm measuring approximately 5 mm x 4.5 mm. This is a wide neck which extends mostly into the origin of the inferior  division.  ENDOVASCULAR STAGED EMBOLIZATION OF WIDE NECK LOBULATED LEFT MCA BIFURCATION ANEURYSM USING THE LVIS JR STENT.  The angiographic findings were reviewed with the patient and the patient's family. Informed consent was obtained. The patient was then put under general anesthesia by the Department of Anesthesiology at [place] catheter in the left common carotid artery was then exchanged over a 0.035 inch 300 cm Constance Holster exchange guidewire for a 6 French 80 cm [place] shuttle sheath using biplane roadmap technique and constant fluoroscopic guidance. Good aspiration was obtained from the side port of the Tuohy-Borst at the hub of the [place] shuttle sheath. A gentle contrast injection demonstrated no evidence of spasms, dissections or intraluminal filling defects at the distal end of the guide catheter in the left common carotid artery. This was then connected to continuous heparinized saline infusion.  A 6 French 115 cm Navien guide catheter was then advanced just distal to the tip of the [place] shuttle sheath in the distal left common carotid artery. The guidewire was  removed. Good aspiration was then obtained from the hub of the 6 [place] Navien guide catheter. A gentle contrast injection revealed no evidence of spasms, dissections or of intraluminal filling defects.  Over a 0.035 inch Roadrunner guidewire, using biplane roadmap technique, the 6 [place] Navien guide catheter was then advanced to the distal cervical left ICA and positioned at the cervical petrous junction. The guidewire was removed. Good aspiration was obtained from the hub of the 6 [place] Navien guide catheter.  At this time in a coaxial manner and with constant heparinized saline infusion using biplane roadmap technique and constant fluoroscopic guidance, over a 0.014 inch Softip Synchro micro guidewire, a Headway 17 2 tip micro catheter which had been steam-shaped was then advanced to the distal end of the 6  [place] Navien guide catheter. The micro guidewire was then gently manipulated using a torque device into the left middle cerebral artery M1 segment followed by the micro catheter. The micro guidewire was then advanced and positioned just proximal to the level of the aneurysm neck at the origin of the inferior division of the left middle cerebral artery. Multiple attempts were then made to advance the micro guidewire with different distal configurations into the inferior divisions. All of these were unsuccessful.  The micro catheter was then advanced to the M2 M3 region of the superior division of the left middle cerebral artery. The guidewire was removed. Good aspiration was obtained from the hub of the Headway micro catheter. This was then connected to continuous heparinized saline infusion.  Through the second port of the Tuohy-Borst at the hub of the 6 [place] Navien guide catheter, an SL 10 2-tip micro catheter with a 45 degree angulation was then advanced over 0.0148 inch Softip Synchro micro guidewire to the distal end of the 6 [place] Navien guide catheter in the left internal carotid artery. With the micro guidewire leading with a J-tip configuration, the combination was then advanced to the left middle cerebral artery M1 segment. The micro guidewire was then advanced into the aneurysm followed by the SL 10 45 degrees angle micro catheter. The micro guidewire was advanced within the aneurysm under constant fluoroscopic guidance gingerly such that the tip of the wire was now pointing into the origin of the inferior division of the left middle cerebral artery. This was then followed by the micro catheter. The micro guidewire was then gently manipulated with access obtained into the inferior division. The wire was then advanced distally into the inferior division followed by the SL 10 micro catheter carefully. There was free advancement of the micro catheter to the M3 region of the left middle cerebral artery  inferior division. The micro guidewire was then removed. Good aspiration was obtained from the hub of the SL 10 micro catheter. A gentle contrast injection demonstrated antegrade flow distally. This was then connected to continuous heparinized saline infusion.  At this time a 2.5 mm x 34 mm LVIS Jr stent with a working length of 30 mm was then advanced in a coaxial manner and with constant heparinized saline infusion using biplane roadmap technique and constant fluoroscopic guidance to the distal end of the SL 10 micro catheter. The O ring on the delivery micro guidewire and the delivery micro catheter were then gently loosened. With slight forward gentle traction with the right hand on the delivery micro guidewire, with the left hand the SL 10 micro catheter was gently retrieved in a controlled gradual slow fashion unsheathing the LVIS stent.  This was continued  until the entire stent was deployed. A control arteriogram performed through the 6 [place] Navien guide catheter demonstrated excellent apposition in the left middle cerebral artery M1 segment and the M2 region of the inferior division of the left middle cerebral artery. However, there was significant narrowing noted in the stent just distal to its entry into the inferior division.  This was then subsequently dilated with a 0.012 inch J-tipped Headliner micro guidewire which was advanced to the focal narrowing within the stent followed by the micro catheter. The micro guidewire was then gingerly advanced to and fro at the site of the superior stenosis. The wire was then retrieved proximally as was the micro catheter. A control arteriogram performed through the 6 [place] Navien guide catheter demonstrated significantly improved caliber of the stent at this point with excellent flow into the vessels.  Control arteriograms were then performed at 15, 30 and 40 minutes post stent deployment. These continued to demonstrate excellent flow. There was suspicion of a  small filling defect along the distal aspect of the LVIS stent. This prompted the use of approximately 4 mg of superselective intracranial Integrelin over about 4 minutes.  Control arteriograms continued to demonstrate excellent flow with opening of the LVIS stent within the aneurysm itself.  A final control arteriogram performed through the 6 [place] Navien guide catheter continued to demonstrate excellent flow without any intraluminal filling defects.  This was then retrieved into the abdominal aorta along with the [place] shuttle sheath. This was then exchanged out for a 6 [place] Pinnacle sheath over a 0.035 inch guidewire. The sheath itself was then removed with the successful application of an external closure device.  The patient's ACT throughout the procedure was maintained in the region of 180 seconds.  No acute neurological or hemodynamic changes were seen throughout the procedure.  The patient's general anesthesia was then reversed and the patient was extubated. Upon recovery the patient demonstrated no new neurological signs or symptoms. Memory and orientation remained stable as did her motor, sensory and coordination function. She was then transferred to the neuro ICU to continue her IV heparin and to control her blood pressure on IV Cardene within the parameters.  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   Electronically Signed   By: Luanne Bras M.D.   On: 01/21/2015 13:30   Ir Angio Intra Extracran Sel Internal Carotid Uni L Mod Sed  01/22/2015   CLINICAL DATA:  Progressive headaches. History of multiple intracranial aneurysms. 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  EXAM: TRANSCATHETER THERAPY EMBOLIZATION OF LEFT MIDDLE CEREBRAL ARTERY REGION ANEURYSM  ANESTHESIA/SEDATION: General anesthesia.  MEDICATIONS: As per general anesthesia.  CONTRAST:  130mL OMNIPAQUE IOHEXOL 300 MG/ML  SOLN  PROCEDURE: Following a full explanation of the procedure along with the potential associated complications, an informed witnessed consent was obtained.  The right groin was prepped and draped in the usual sterile fashion. Thereafter using modified Seldinger technique, transfemoral access into right common femoral artery was obtained without difficulty. Over a 0.035 inch guidewire, a 5 French Pinnacle sheath was inserted. Through this, and also over a 0.035 inch guidewire, 5 French JB1 catheter was advanced to the aortic arch region and selectively positioned in the right common carotid  artery, the right vertebral artery, and the left common carotid artery. The patient tolerated the procedure well. A 3D rotational arteriogram was also performed of the left intracranial circulation from a left-sided common carotid artery injection. Reconstructions were performed on a separate workstation.  COMPLICATIONS: None immediate.  FINDINGS: The right common carotid arteriogram demonstrates the right external carotid artery and its major branches to be widely patent.  The right internal carotid artery at the bulb and its proximal one-third is patent.  There is a modest kink at the junction of the middle and the one-third of the right internal carotid artery without impedance of any blood flow distally.  More distally the vessel is seen to opacify normally to the cranial skull base.  The petrous, cavernous and supraclinoid segments are widely patent.  The right middle and the right anterior cerebral arteries are seen to opacify normally into the capillary and venous phases.  The right pericallosal artery is seen to cross the midline and supply the distal anterior cerebral artery distribution on the  left.  Also seen are the previously positioned clips in the right middle cerebral artery distribution bifurcation. There is a mild fusiform prominence noted proximal to the clips at the inferior division of the right middle cerebral artery.  However, no angiographic change is noted from the previous catheter angiogram.  The vertebral artery origin is normal.  This is a dominant vertebral which it is seen to opacify to the cranial skull base. The previously endovascularly coiled right posterior-inferior cerebellar artery remains completely obliterated without evidence of recanalization or of coil compaction.  The right posterior-inferior cerebellar artery remains widely patent.  The distal basilar artery, the posterior cerebral arteries, the superior cerebellar arteries and the anterior inferior cerebellar arteries opacify normally into the capillary and venous phases.  The left common carotid arteriogram demonstrates the left external carotid artery and its major branches to be normal.  The left internal carotid artery at the bulb to the cranial skull base opacifies normally.  The petrous, the cavernous and the supraclinoid segments are widely patent. The left middle cerebral artery and the left anterior cerebral artery are seen to opacify normally into the capillary and venous phases.  Arising in the region of the superior and the inferior divisions of the left middle cerebral artery again seen is a saccular outpouching projecting inferiorly and slightly anteriorly.  A 3D rotational arteriogram performed with reconstruction on a separate workstation confirms the presence of a wide neck lobulated aneurysm measuring approximately 5 mm x 4.5 mm. This is a wide neck which extends mostly into the origin of the inferior division.  ENDOVASCULAR STAGED EMBOLIZATION OF WIDE NECK LOBULATED LEFT MCA BIFURCATION ANEURYSM USING THE LVIS JR STENT.  The angiographic findings were reviewed with the patient and the patient's  family. Informed consent was obtained. The patient was then put under general anesthesia by the Department of Anesthesiology at [place] catheter in the left common carotid artery was then exchanged over a 0.035 inch 300 cm Constance Holster exchange guidewire for a 6 French 80 cm Cook shuttle sheath using biplane roadmap technique and constant fluoroscopic guidance. Good aspiration was obtained from the side port of the Tuohy-Borst at the hub of the [place] shuttle sheath. A gentle contrast injection demonstrated no evidence of spasms, dissections or intraluminal filling defects at the distal end of the guide catheter in the left common carotid artery. This was then connected to continuous heparinized saline infusion.  A 6 French 115 cm Navien guide catheter was then advanced just distal to the tip of the [place] shuttle sheath in the distal left common carotid artery. The guidewire was removed. Good aspiration was then obtained from the hub of the 6 [place] Navien guide catheter. A gentle contrast injection revealed no evidence of spasms, dissections or of intraluminal filling defects.  Over a 0.035 inch Roadrunner guidewire, using biplane roadmap technique, the 6 [place] Navien guide catheter was then advanced to the distal cervical left ICA and positioned at the cervical petrous junction. The guidewire was removed. Good aspiration was obtained from the hub of the 6 [place] Navien guide catheter.  At this time in a coaxial manner and with constant heparinized saline infusion using biplane roadmap technique and constant fluoroscopic guidance, over a 0.014 inch Softip Synchro micro guidewire, a Headway 17 2 tip micro catheter which had been steam-shaped was then advanced to the distal end of the 6 [place] Navien guide catheter. The micro guidewire was then gently manipulated using a torque device into the left middle cerebral artery M1 segment followed by the micro catheter. The micro guidewire  was then advanced and positioned just proximal to the level of the aneurysm neck at the origin of the inferior division of the left middle cerebral artery. Multiple attempts were then made to advance the micro guidewire with different distal configurations into the inferior divisions. All of these were unsuccessful.  The micro catheter was then advanced to the M2 M3 region of the superior division of the left middle cerebral artery. The guidewire was removed. Good aspiration was obtained from the hub of the Headway micro catheter. This was then connected to continuous heparinized saline infusion.  Through the second port of the Tuohy-Borst at the hub of the 6 [place] Navien guide catheter, an SL 10 2-tip micro catheter with a 45 degree angulation was then advanced over 0.0148 inch Softip Synchro micro guidewire to the distal end of the 6 [place] Navien guide catheter in the left internal carotid artery. With the micro guidewire leading with a J-tip configuration, the combination was then advanced to the left middle cerebral artery M1 segment. The micro guidewire was then advanced into the aneurysm followed by the SL 10 45 degrees angle micro catheter. The micro guidewire was advanced within the aneurysm under constant fluoroscopic guidance gingerly such that the tip of the wire was now pointing into the origin of the inferior division of the left middle cerebral artery. This was then followed by the micro catheter. The micro guidewire was then gently manipulated with access obtained into the inferior division. The wire was then advanced distally into the inferior division followed by the SL 10 micro catheter carefully. There was free advancement of the micro catheter to the M3 region of the left middle cerebral artery inferior division. The micro guidewire was then removed. Good aspiration was obtained from the hub of the SL 10 micro catheter. A gentle contrast injection demonstrated antegrade flow distally. This was  then connected to continuous heparinized saline infusion.  At this time a 2.5 mm x 34 mm LVIS Jr stent with a working length of 30 mm was then advanced in a coaxial manner and with constant heparinized saline infusion using biplane roadmap technique and constant fluoroscopic guidance to the distal end of the SL 10 micro catheter. The O ring on the delivery micro guidewire and the delivery micro catheter were then gently loosened. With slight forward gentle traction with the right hand  on the delivery micro guidewire, with the left hand the SL 10 micro catheter was gently retrieved in a controlled gradual slow fashion unsheathing the LVIS stent.  This was continued until the entire stent was deployed. A control arteriogram performed through the 6 [place] Navien guide catheter demonstrated excellent apposition in the left middle cerebral artery M1 segment and the M2 region of the inferior division of the left middle cerebral artery. However, there was significant narrowing noted in the stent just distal to its entry into the inferior division.  This was then subsequently dilated with a 0.012 inch J-tipped Headliner micro guidewire which was advanced to the focal narrowing within the stent followed by the micro catheter. The micro guidewire was then gingerly advanced to and fro at the site of the superior stenosis. The wire was then retrieved proximally as was the micro catheter. A control arteriogram performed through the 6 [place] Navien guide catheter demonstrated significantly improved caliber of the stent at this point with excellent flow into the vessels.  Control arteriograms were then performed at 15, 30 and 40 minutes post stent deployment. These continued to demonstrate excellent flow. There was suspicion of a small filling defect along the distal aspect of the LVIS stent. This prompted the use of approximately 4 mg of superselective intracranial Integrelin over about 4 minutes.  Control arteriograms continued  to demonstrate excellent flow with opening of the LVIS stent within the aneurysm itself.  A final control arteriogram performed through the 6 [place] Navien guide catheter continued to demonstrate excellent flow without any intraluminal filling defects.  This was then retrieved into the abdominal aorta along with the [place] shuttle sheath. This was then exchanged out for a 6 [place] Pinnacle sheath over a 0.035 inch guidewire. The sheath itself was then removed with the successful application of an external closure device.  The patient's ACT throughout the procedure was maintained in the region of 180 seconds.  No acute neurological or hemodynamic changes were seen throughout the procedure.  The patient's general anesthesia was then reversed and the patient was extubated. Upon recovery the patient demonstrated no new neurological signs or symptoms. Memory and orientation remained stable as did her motor, sensory and coordination function. She was then transferred to the neuro ICU to continue her IV heparin and to control her blood pressure on IV Cardene within the parameters.  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   Electronically Signed   By: Luanne Bras M.D.   On: 01/21/2015 13:30   Ir Angio Vertebral Sel Vertebral Uni R Mod Sed  01/22/2015   CLINICAL DATA:  Progressive headaches. History of multiple intracranial aneurysms. 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  EXAM: TRANSCATHETER THERAPY EMBOLIZATION OF LEFT MIDDLE CEREBRAL ARTERY REGION ANEURYSM  ANESTHESIA/SEDATION: General anesthesia.  MEDICATIONS: As per general anesthesia.  CONTRAST:  135mL OMNIPAQUE  IOHEXOL 300 MG/ML  SOLN  PROCEDURE: Following a full explanation of the procedure along with the potential associated complications, an informed witnessed consent was obtained.  The right groin was prepped and draped in the usual sterile fashion. Thereafter using modified Seldinger technique, transfemoral access into right common femoral artery was obtained without difficulty. Over a 0.035 inch guidewire, a 5 [place] Pinnacle sheath was  inserted. Through this, and also over a 0.035 inch guidewire, 5 French JB1 catheter was advanced to the aortic arch region and selectively positioned in the right common carotid artery, the right vertebral artery, and the left common carotid artery. The patient tolerated the procedure well. A 3D rotational arteriogram was also performed of the left intracranial circulation from a left-sided common carotid artery injection. Reconstructions were performed on a separate workstation.  COMPLICATIONS: None immediate.  FINDINGS: The right common carotid arteriogram demonstrates the right external carotid artery and its major branches to be widely patent.  The right internal carotid artery at the bulb and its proximal one-third is patent.  There is a modest kink at the junction of the middle and the one-third of the right internal carotid artery without impedance of any blood flow distally.  More distally the vessel is seen to opacify normally to the cranial skull base.  The petrous, cavernous and supraclinoid segments are widely patent.  The right middle and the right anterior cerebral arteries are seen to opacify normally into the capillary and venous phases.  The right pericallosal artery is seen to cross the midline and supply the distal anterior cerebral artery distribution on the left.  Also seen are the previously positioned clips in the right middle cerebral artery distribution bifurcation. There is a mild fusiform prominence noted proximal to the clips at the inferior division of the  right middle cerebral artery.  However, no angiographic change is noted from the previous catheter angiogram.  The vertebral artery origin is normal.  This is a dominant vertebral which it is seen to opacify to the cranial skull base. The previously endovascularly coiled right posterior-inferior cerebellar artery remains completely obliterated without evidence of recanalization or of coil compaction.  The right posterior-inferior cerebellar artery remains widely patent.  The distal basilar artery, the posterior cerebral arteries, the superior cerebellar arteries and the anterior inferior cerebellar arteries opacify normally into the capillary and venous phases.  The left common carotid arteriogram demonstrates the left external carotid artery and its major branches to be normal.  The left internal carotid artery at the bulb to the cranial skull base opacifies normally.  The petrous, the cavernous and the supraclinoid segments are widely patent. The left middle cerebral artery and the left anterior cerebral artery are seen to opacify normally into the capillary and venous phases.  Arising in the region of the superior and the inferior divisions of the left middle cerebral artery again seen is a saccular outpouching projecting inferiorly and slightly anteriorly.  A 3D rotational arteriogram performed with reconstruction on a separate workstation confirms the presence of a wide neck lobulated aneurysm measuring approximately 5 mm x 4.5 mm. This is a wide neck which extends mostly into the origin of the inferior division.  ENDOVASCULAR STAGED EMBOLIZATION OF WIDE NECK LOBULATED LEFT MCA BIFURCATION ANEURYSM USING THE LVIS JR STENT.  The angiographic findings were reviewed with the patient and the patient's family. Informed consent was obtained. The patient was then put under general anesthesia by the Department of Anesthesiology at [place] catheter in the left common carotid artery was  then exchanged over a 0.035 inch 300 cm Constance Holster exchange guidewire for a 6 French 80 cm Cook shuttle sheath using biplane roadmap technique and constant fluoroscopic guidance. Good aspiration was obtained from the side port of the Tuohy-Borst at the hub of the [place] shuttle sheath. A gentle contrast injection demonstrated no evidence of spasms,  dissections or intraluminal filling defects at the distal end of the guide catheter in the left common carotid artery. This was then connected to continuous heparinized saline infusion.  A 6 French 115 cm Navien guide catheter was then advanced just distal to the tip of the [place] shuttle sheath in the distal left common carotid artery. The guidewire was removed. Good aspiration was then obtained from the hub of the 6 [place] Navien guide catheter. A gentle contrast injection revealed no evidence of spasms, dissections or of intraluminal filling defects.  Over a 0.035 inch Roadrunner guidewire, using biplane roadmap technique, the 6 [place] Navien guide catheter was then advanced to the distal cervical left ICA and positioned at the cervical petrous junction. The guidewire was removed. Good aspiration was obtained from the hub of the 6 [place] Navien guide catheter.  At this time in a coaxial manner and with constant heparinized saline infusion using biplane roadmap technique and constant fluoroscopic guidance, over a 0.014 inch Softip Synchro micro guidewire, a Headway 17 2 tip micro catheter which had been steam-shaped was then advanced to the distal end of the 6 [place] Navien guide catheter. The micro guidewire was then gently manipulated using a torque device into the left middle cerebral artery M1 segment followed by the micro catheter. The micro guidewire was then advanced and positioned just proximal to the level of the aneurysm neck at the origin of the inferior division of the left middle cerebral artery. Multiple attempts were then made to advance the micro  guidewire with different distal configurations into the inferior divisions. All of these were unsuccessful.  The micro catheter was then advanced to the M2 M3 region of the superior division of the left middle cerebral artery. The guidewire was removed. Good aspiration was obtained from the hub of the Headway micro catheter. This was then connected to continuous heparinized saline infusion.  Through the second port of the Tuohy-Borst at the hub of the 6 [place] Navien guide catheter, an SL 10 2-tip micro catheter with a 45 degree angulation was then advanced over 0.0148 inch Softip Synchro micro guidewire to the distal end of the 6 [place] Navien guide catheter in the left internal carotid artery. With the micro guidewire leading with a J-tip configuration, the combination was then advanced to the left middle cerebral artery M1 segment. The micro guidewire was then advanced into the aneurysm followed by the SL 10 45 degrees angle micro catheter. The micro guidewire was advanced within the aneurysm under constant fluoroscopic guidance gingerly such that the tip of the wire was now pointing into the origin of the inferior division of the left middle cerebral artery. This was then followed by the micro catheter. The micro guidewire was then gently manipulated with access obtained into the inferior division. The wire was then advanced distally into the inferior division followed by the SL 10 micro catheter carefully. There was free advancement of the micro catheter to the M3 region of the left middle cerebral artery inferior division. The micro guidewire was then removed. Good aspiration was obtained from the hub of the SL 10 micro catheter. A gentle contrast injection demonstrated antegrade flow distally. This was then connected to continuous heparinized saline infusion.  At this time a 2.5 mm x 34 mm LVIS Jr stent with a working length of 30 mm was then advanced in a coaxial manner and with constant heparinized saline  infusion using biplane roadmap technique and constant fluoroscopic guidance to the distal end of the SL  10 micro catheter. The O ring on the delivery micro guidewire and the delivery micro catheter were then gently loosened. With slight forward gentle traction with the right hand on the delivery micro guidewire, with the left hand the SL 10 micro catheter was gently retrieved in a controlled gradual slow fashion unsheathing the LVIS stent.  This was continued until the entire stent was deployed. A control arteriogram performed through the 6 [place] Navien guide catheter demonstrated excellent apposition in the left middle cerebral artery M1 segment and the M2 region of the inferior division of the left middle cerebral artery. However, there was significant narrowing noted in the stent just distal to its entry into the inferior division.  This was then subsequently dilated with a 0.012 inch J-tipped Headliner micro guidewire which was advanced to the focal narrowing within the stent followed by the micro catheter. The micro guidewire was then gingerly advanced to and fro at the site of the superior stenosis. The wire was then retrieved proximally as was the micro catheter. A control arteriogram performed through the 6 [place] Navien guide catheter demonstrated significantly improved caliber of the stent at this point with excellent flow into the vessels.  Control arteriograms were then performed at 15, 30 and 40 minutes post stent deployment. These continued to demonstrate excellent flow. There was suspicion of a small filling defect along the distal aspect of the LVIS stent. This prompted the use of approximately 4 mg of superselective intracranial Integrelin over about 4 minutes.  Control arteriograms continued to demonstrate excellent flow with opening of the LVIS stent within the aneurysm itself.  A final control arteriogram performed through the 6 [place] Navien guide catheter continued to demonstrate excellent  flow without any intraluminal filling defects.  This was then retrieved into the abdominal aorta along with the [place] shuttle sheath. This was then exchanged out for a 6 [place] Pinnacle sheath over a 0.035 inch guidewire. The sheath itself was then removed with the successful application of an external closure device.  The patient's ACT throughout the procedure was maintained in the region of 180 seconds.  No acute neurological or hemodynamic changes were seen throughout the procedure.  The patient's general anesthesia was then reversed and the patient was extubated. Upon recovery the patient demonstrated no new neurological signs or symptoms. Memory and orientation remained stable as did her motor, sensory and coordination function. She was then transferred to the neuro ICU to continue her IV heparin and to control her blood pressure on IV Cardene within the parameters.  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   Electronically Signed   By: Luanne Bras M.D.   On: 01/21/2015 13:30   Ir Neuro Each Add'l After Basic Uni Left (ms)  01/22/2015   CLINICAL DATA:  Progressive headaches. History of multiple intracranial aneurysms. 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  EXAM: TRANSCATHETER THERAPY EMBOLIZATION OF LEFT MIDDLE CEREBRAL ARTERY REGION ANEURYSM  ANESTHESIA/SEDATION: General anesthesia.  MEDICATIONS: As per general anesthesia.  CONTRAST:  160mL OMNIPAQUE IOHEXOL 300 MG/ML  SOLN  PROCEDURE: Following a full explanation of the procedure along with the potential associated complications, an informed witnessed consent was obtained.  The right groin was prepped and  draped in the usual  sterile fashion. Thereafter using modified Seldinger technique, transfemoral access into right common femoral artery was obtained without difficulty. Over a 0.035 inch guidewire, a 5 French Pinnacle sheath was inserted. Through this, and also over a 0.035 inch guidewire, 5 French JB1 catheter was advanced to the aortic arch region and selectively positioned in the right common carotid artery, the right vertebral artery, and the left common carotid artery. The patient tolerated the procedure well. A 3D rotational arteriogram was also performed of the left intracranial circulation from a left-sided common carotid artery injection. Reconstructions were performed on a separate workstation.  COMPLICATIONS: None immediate.  FINDINGS: The right common carotid arteriogram demonstrates the right external carotid artery and its major branches to be widely patent.  The right internal carotid artery at the bulb and its proximal one-third is patent.  There is a modest kink at the junction of the middle and the one-third of the right internal carotid artery without impedance of any blood flow distally.  More distally the vessel is seen to opacify normally to the cranial skull base.  The petrous, cavernous and supraclinoid segments are widely patent.  The right middle and the right anterior cerebral arteries are seen to opacify normally into the capillary and venous phases.  The right pericallosal artery is seen to cross the midline and supply the distal anterior cerebral artery distribution on the left.  Also seen are the previously positioned clips in the right middle cerebral artery distribution bifurcation. There is a mild fusiform prominence noted proximal to the clips at the inferior division of the right middle cerebral artery.  However, no angiographic change is noted from the previous catheter angiogram.  The vertebral artery origin is normal.  This is a dominant vertebral which it is seen to opacify  to the cranial skull base. The previously endovascularly coiled right posterior-inferior cerebellar artery remains completely obliterated without evidence of recanalization or of coil compaction.  The right posterior-inferior cerebellar artery remains widely patent.  The distal basilar artery, the posterior cerebral arteries, the superior cerebellar arteries and the anterior inferior cerebellar arteries opacify normally into the capillary and venous phases.  The left common carotid arteriogram demonstrates the left external carotid artery and its major branches to be normal.  The left internal carotid artery at the bulb to the cranial skull base opacifies normally.  The petrous, the cavernous and the supraclinoid segments are widely patent. The left middle cerebral artery and the left anterior cerebral artery are seen to opacify normally into the capillary and venous phases.  Arising in the region of the superior and the inferior divisions of the left middle cerebral artery again seen is a saccular outpouching projecting inferiorly and slightly anteriorly.  A 3D rotational arteriogram performed with reconstruction on a separate workstation confirms the presence of a wide neck lobulated aneurysm measuring approximately 5 mm x 4.5 mm. This is a wide neck which extends mostly into the origin of the inferior division.  ENDOVASCULAR STAGED EMBOLIZATION OF WIDE NECK LOBULATED LEFT MCA BIFURCATION ANEURYSM USING THE LVIS JR STENT.  The angiographic findings were reviewed with the patient and the patient's family. Informed consent was obtained. The patient was then put under general anesthesia by the Department of Anesthesiology at [place] catheter in the left common carotid artery was then exchanged over a 0.035 inch 300 cm Constance Holster exchange guidewire for a 6 French 80 cm Cook shuttle sheath using biplane roadmap technique and constant fluoroscopic guidance. Good aspiration  was obtained from  the side port of the Tuohy-Borst at the hub of the [place] shuttle sheath. A gentle contrast injection demonstrated no evidence of spasms, dissections or intraluminal filling defects at the distal end of the guide catheter in the left common carotid artery. This was then connected to continuous heparinized saline infusion.  A 6 French 115 cm Navien guide catheter was then advanced just distal to the tip of the [place] shuttle sheath in the distal left common carotid artery. The guidewire was removed. Good aspiration was then obtained from the hub of the 6 [place] Navien guide catheter. A gentle contrast injection revealed no evidence of spasms, dissections or of intraluminal filling defects.  Over a 0.035 inch Roadrunner guidewire, using biplane roadmap technique, the 6 [place] Navien guide catheter was then advanced to the distal cervical left ICA and positioned at the cervical petrous junction. The guidewire was removed. Good aspiration was obtained from the hub of the 6 [place] Navien guide catheter.  At this time in a coaxial manner and with constant heparinized saline infusion using biplane roadmap technique and constant fluoroscopic guidance, over a 0.014 inch Softip Synchro micro guidewire, a Headway 17 2 tip micro catheter which had been steam-shaped was then advanced to the distal end of the 6 [place] Navien guide catheter. The micro guidewire was then gently manipulated using a torque device into the left middle cerebral artery M1 segment followed by the micro catheter. The micro guidewire was then advanced and positioned just proximal to the level of the aneurysm neck at the origin of the inferior division of the left middle cerebral artery. Multiple attempts were then made to advance the micro guidewire with different distal configurations into the inferior divisions. All of these were unsuccessful.  The micro catheter was then advanced to the M2 M3 region of the superior division of the left middle  cerebral artery. The guidewire was removed. Good aspiration was obtained from the hub of the Headway micro catheter. This was then connected to continuous heparinized saline infusion.  Through the second port of the Tuohy-Borst at the hub of the 6 [place] Navien guide catheter, an SL 10 2-tip micro catheter with a 45 degree angulation was then advanced over 0.0148 inch Softip Synchro micro guidewire to the distal end of the 6 [place] Navien guide catheter in the left internal carotid artery. With the micro guidewire leading with a J-tip configuration, the combination was then advanced to the left middle cerebral artery M1 segment. The micro guidewire was then advanced into the aneurysm followed by the SL 10 45 degrees angle micro catheter. The micro guidewire was advanced within the aneurysm under constant fluoroscopic guidance gingerly such that the tip of the wire was now pointing into the origin of the inferior division of the left middle cerebral artery. This was then followed by the micro catheter. The micro guidewire was then gently manipulated with access obtained into the inferior division. The wire was then advanced distally into the inferior division followed by the SL 10 micro catheter carefully. There was free advancement of the micro catheter to the M3 region of the left middle cerebral artery inferior division. The micro guidewire was then removed. Good aspiration was obtained from the hub of the SL 10 micro catheter. A gentle contrast injection demonstrated antegrade flow distally. This was then connected to continuous heparinized saline infusion.  At this time a 2.5 mm x 34 mm LVIS Jr stent with a working length of 30 mm  was then advanced in a coaxial manner and with constant heparinized saline infusion using biplane roadmap technique and constant fluoroscopic guidance to the distal end of the SL 10 micro catheter. The O ring on the delivery micro guidewire and the delivery micro catheter were then  gently loosened. With slight forward gentle traction with the right hand on the delivery micro guidewire, with the left hand the SL 10 micro catheter was gently retrieved in a controlled gradual slow fashion unsheathing the LVIS stent.  This was continued until the entire stent was deployed. A control arteriogram performed through the 6 [place] Navien guide catheter demonstrated excellent apposition in the left middle cerebral artery M1 segment and the M2 region of the inferior division of the left middle cerebral artery. However, there was significant narrowing noted in the stent just distal to its entry into the inferior division.  This was then subsequently dilated with a 0.012 inch J-tipped Headliner micro guidewire which was advanced to the focal narrowing within the stent followed by the micro catheter. The micro guidewire was then gingerly advanced to and fro at the site of the superior stenosis. The wire was then retrieved proximally as was the micro catheter. A control arteriogram performed through the 6 [place] Navien guide catheter demonstrated significantly improved caliber of the stent at this point with excellent flow into the vessels.  Control arteriograms were then performed at 15, 30 and 40 minutes post stent deployment. These continued to demonstrate excellent flow. There was suspicion of a small filling defect along the distal aspect of the LVIS stent. This prompted the use of approximately 4 mg of superselective intracranial Integrelin over about 4 minutes.  Control arteriograms continued to demonstrate excellent flow with opening of the LVIS stent within the aneurysm itself.  A final control arteriogram performed through the 6 [place] Navien guide catheter continued to demonstrate excellent flow without any intraluminal filling defects.  This was then retrieved into the abdominal aorta along with the [place] shuttle sheath. This was then exchanged out for a 6 [place] Pinnacle sheath over a  0.035 inch guidewire. The sheath itself was then removed with the successful application of an external closure device.  The patient's ACT throughout the procedure was maintained in the region of 180 seconds.  No acute neurological or hemodynamic changes were seen throughout the procedure.  The patient's general anesthesia was then reversed and the patient was extubated. Upon recovery the patient demonstrated no new neurological signs or symptoms. Memory and orientation remained stable as did her motor, sensory and coordination function. She was then transferred to the neuro ICU to continue her IV heparin and to control her blood pressure on IV Cardene within the parameters.  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   Electronically Signed   By: Luanne Bras M.D.   On: 01/21/2015 13:30     PHYSICAL EXAM Pleasant middle aged obese Caucasian lady not in distress. . Afebrile. Head is nontraumatic. Neck is supple without bruit.    Cardiac exam no murmur or gallop. Lungs are clear to auscultation. Distal pulses are well felt. Neurological Exam ;  Awake alert oriented x 3 normal speech and language. Minimum left lower face asymmetry. Tongue midline. LUE drift.No LLE drift. Mild diminished fine finger movements on left and significant left grip weakness.. Orbits right over left upper extremity.  .Diminished left hemibody  Sensation including touch/pinprick, position and vibration .Impaired left finger to nose coordination. ASSESSMENT/PLAN Ms. Diane Hutchinson is a 69 y.o. female with history of multiple intracranial aneurysms with c/o of worsening frontal headaches who developed left arm and leg ataxia post L MCA elective embolization with pipeline stent. She did not receive IV t-PA due to heparin drip during cath.   Stroke:  Non-dominant right  thalamic infarct, embolic secondary to complication of  Cerebral catheter angiogram during interventional neuroradiology aneurysm embolization Hx R MCA aneurysmal clipping 1993 Hx R PICA aneurysmal coiling s/p rupture 2010  Resultant  left hemiparesis and sensory loss  Repeat CT 01/21/14 confirms right thalamic infarct  2D Echo  pending   HgbA1c 6.0  SCDs for VTE prophylaxis  Diet regular thin liquids  aspirin 81 mg orally every day and clopidogrel 75 mg orally every day prior to admission, now on aspirin 325 mg orally every day and clopidogrel 75 mg orally every day   Patient followed by Dr. Jaynee Eagles as an OP  Therapy recommendations:  Inpatient rehab   Disposition:  pending   Hypertension  Stable  Hyperlipidemia  Home meds:  lipitor 40 & omega 3. Will resume in hospital  LDL 53,  At goal < 70  Continue statin at discharge  Other Stroke Risk Factors  Advanced age  Cigarette smoker, advised to stop smoking  Migraines  Other Active Problems  Depression  COPD  GERD  Hospital day # 2  I have personally examined this patient, reviewed notes, independently viewed imaging studies, participated in medical decision making and plan of care. I have made any additions or clarifications directly to the above note. She  has a right  Thalamic subcortical infarct secondary to embolization during cerebral catheter angiogram. The infarct is not in the area which underwent stenting. She needs ongoing therapies and likely rehab in inpatient setting. Continue aspirin and Plavix given recent cardiac stent and discontinue heparin. Mobilize out of bed and transfer to neurology floor bed.  . Discuss with patient, family members and Dr. Estanislado Pandy and answered questions  Antony Contras, [place] Pager: [phone] 01/22/2015 11:16 AM     To contact Stroke Continuity provider, please refer to [email or web address] After hours, contact General Neurology

## 2015-01-22 NOTE — Progress Notes (Signed)
*  PRELIMINARY RESULTS* Echocardiogram 2D Echocardiogram has been performed.  Diane Hutchinson 01/22/2015, 3:33 PM

## 2015-01-22 NOTE — Progress Notes (Signed)
I met with pt, her spouse, and friend at bedside. We discussed an inpt rehab admission today and they are in agreement. I will make the arrangements for today. [phone]

## 2015-01-22 NOTE — H&P (Signed)
Physical Medicine and Rehabilitation Admission H&P    Chief Complaint:Headache  HPI: Diane Hutchinson is a 69 y.o. right handed female with history of hypertension, migraine headaches as well as multiple intracranial aneurysms with right MCA aneurysmal clipping 1993 as well as coiling 2010 without residual weakness.. Independent/driving prior to admission living with her husband. Presented 01/20/2015 with decreased balance as well as headache. She had been seen by interventional radiology in the past with plan for image guided cerebral arteriogram for findings a left MCA aneurysm. Underwent left MCA elective embolization using stent assistance 01/20/2015 per Intervention radiology. Patient was extubated noted left-sided tingling and weakness. Code stroke was called. CT of the head showed no acute stroke. CTA was then performed again showing no large vessel occlusion. She was not a TPA candidate. Neurology consulted suspect non-dominant right brain infarct, embolic secondary to complication of interventional neuroradiology aneurysm embolization. Echocardiogram is pending. She currently is maintained on aspirin and Plavix therapy. Subcutaneous Lovenox added for DVT prophylaxis. Bouts of restlessness and agitation her Risperdal and Desyrel as prior to admission was resumed. Physical therapy evaluation completed 01/21/2015 with recommendations of physical medicine rehabilitation consult.Patient was admitted for a comprehensive rehab program  ROS Review of Systems  Neurological: Positive for focal weakness and headaches.  Psychiatric/Behavioral: Positive for depression.  All other systems reviewed and are negative   Past Medical History  Diagnosis Date  . Migraine   . Depression   . Hypertension   . Stroke   . COPD (chronic obstructive pulmonary disease)   . Anxiety     h/o of panic attack  . GERD (gastroesophageal reflux disease)     occas. use of TUMS  . Arthritis     knees   Past  Surgical History  Procedure Laterality Date  . [place] surgery    . Cholecystectomy    . Appendectomy    . Eye surgery      laser to both eyes for blocked tear ducts   . Brain surgery  1993    aneurysm  . Tubal ligation    . Hemorroidectomy    . Radiology with anesthesia N/A 01/20/2015    Procedure: RADIOLOGY WITH ANESTHESIA;  Surgeon: Luanne Bras, MD;  Location: [facility];  Service: Radiology;  Laterality: N/A;   Family History  Problem Relation Age of Onset  . Stroke Neg Hx    Social History:  reports that she has been smoking.  She has never used smokeless tobacco. She reports that she does not drink alcohol or use illicit drugs. Allergies:  Allergies  Allergen Reactions  . Codeine Other (See Comments)    dellusions  . Meperidine Hcl Other (See Comments)    Hurts stomach  . Morphine Other (See Comments)    dellusions  . Sulfonamide Derivatives Other (See Comments)    Drives her nuts   Medications Prior to Admission  Medication Sig Dispense Refill  . alendronate (FOSAMAX) 70 MG tablet Take 70 mg by mouth every Monday. Take with a full glass of water on an empty stomach.    . ALPRAZolam (XANAX) 1 MG tablet Take 1 mg by mouth 3 (three) times daily as needed for anxiety.    Marland Kitchen aspirin 81 MG tablet Take 324 mg by mouth once.    Marland Kitchen atorvastatin (LIPITOR) 40 MG tablet Take 40 mg by mouth daily.    . Cholecalciferol (VITAMIN D PO) Take 1 tablet by mouth daily.    . clopidogrel (PLAVIX) 75 MG tablet Take  75 mg by mouth daily.    Marland Kitchen gemfibrozil (LOPID) 600 MG tablet Take 600 mg by mouth 2 (two) times daily before a meal.    . meloxicam (MOBIC) 15 MG tablet Take 15 mg by mouth daily.    . metoprolol succinate (TOPROL XL) 25 MG 24 hr tablet Take 25 mg by mouth at bedtime.     . Omega-3 Fatty Acids (FISH OIL PO) Take 1 capsule by mouth daily.    . risperiDONE (RISPERDAL) 0.5 MG tablet Take 0.5 mg by mouth at bedtime.    . sertraline (ZOLOFT) 100 MG tablet Take 200 mg by mouth at bedtime.    4  . traZODone (DESYREL) 150 MG tablet Take 300 mg by mouth at bedtime.    Marland Kitchen VITAMIN E PO Take 1 tablet by mouth daily.    Marland Kitchen albuterol (PROVENTIL) (2.5 MG/3ML) 0.083% nebulizer solution Take 2.5 mg by nebulization every 6 (six) hours as needed for wheezing or shortness of breath.    Marland Kitchen ipratropium (ATROVENT) 0.02 % nebulizer solution Take 0.5 mg by nebulization every 6 (six) hours as needed for wheezing or shortness of breath.       Home: Home Living Family/patient expects to be discharged to:: Inpatient rehab Living Arrangements: Spouse/significant other   Functional History: Prior Function Level of Independence: Independent  Functional Status:  Mobility: Bed Mobility Overal bed mobility: Needs Assistance Bed Mobility: Supine to Sit Supine to sit: Mod assist General bed mobility comments: cues for step-by-step sequencing, attending to L side, and encouragement.  pt tends to neglect L UE > LE.   Transfers Overall transfer level: Needs assistance Equipment used: 2 person hand held assist Transfers: Sit to/from Stand, Stand Pivot Transfers Sit to Stand: Mod assist Stand pivot transfers: Max assist, +2 physical assistance General transfer comment: pt able to come to stand with one person A and use of R UE on armrest of recliner.  pt with por awareness of L side and poor proprioception as pt's L foot tends to supinate.  cues and facilitation for trunk/hip extension into standing and to complete pivot.        ADL:    Cognition: Cognition Overall Cognitive Status: Within Functional Limits for tasks assessed Orientation Level: Oriented X4 Cognition Arousal/Alertness: Awake/alert Behavior During Therapy: WFL for tasks assessed/performed Overall Cognitive Status: Within Functional Limits for tasks assessed  Physical Exam: Blood pressure 135/71, pulse 70, temperature 98.9 F (37.2 C), temperature source Oral, resp. rate 18, Hutchinson $RemoveBe'5\' 7"'gopgrhitx$  (1.702 m), weight 80.74 kg (178 lb), SpO2  99 %. Physical Exam Constitutional: She is oriented to person, place, and time. She appears well-developed and well-nourished.  HENT:  Head: Normocephalic and atraumatic.  Right Ear: External ear normal.  Eyes: Conjunctivae and EOM are normal. Pupils are equal, round, and reactive to light. Right eye exhibits no discharge. Left eye exhibits no discharge.  Neck: Normal range of motion. Neck supple. No JVD present. No tracheal deviation present. No thyromegaly present.  Cardiovascular: Normal rate, regular rhythm and normal heart sounds. Exam reveals no friction rub.  No murmur heard. Respiratory: Effort normal and breath sounds normal. No respiratory distress. She has no wheezes. She has no rales.  GI: Soft. Bowel sounds are normal. She exhibits no distension. There is no tenderness. There is no rebound.  Musculoskeletal: She exhibits no edema or tenderness.  Neurological: She is alert and oriented to person, place, and time.  Follows commands. Fair awareness of deficits. LUE: delt 3+, bicep and tricep 3,  wrist and HI 3-. LLE: 1+ to 2- HF, KE and 2/5 ADF/APF. RUE and RLE 5/5. Decreased sensation to LT and PP on left face,tongue, arm, leg. Mild left central 7. Speech slightly slurred but very intelligible. DTR's 1+. Toes up on left.  Skin: Skin is warm and dry. No rash noted. No erythema   Results for orders placed or performed during the hospital encounter of 01/20/15 (from the past 48 hour(s))  MRSA PCR Screening     Status: None   Collection Time: 01/20/15  4:00 PM  Result Value Ref Range   MRSA by PCR NEGATIVE NEGATIVE    Comment:        The GeneXpert MRSA Assay (FDA approved for NASAL specimens only), is one component of a comprehensive MRSA colonization surveillance program. It is not intended to diagnose MRSA infection nor to guide or monitor treatment for MRSA infections.   Heparin level (unfractionated)     Status: Abnormal   Collection Time: 01/20/15 11:00 PM  Result  Value Ref Range   Heparin Unfractionated 0.14 (L) 0.30 - 0.70 IU/mL    Comment:        IF HEPARIN RESULTS ARE BELOW EXPECTED VALUES, AND PATIENT DOSAGE HAS BEEN CONFIRMED, SUGGEST FOLLOW UP TESTING OF ANTITHROMBIN III LEVELS.   Basic metabolic panel     Status: Abnormal   Collection Time: 01/21/15  5:05 AM  Result Value Ref Range   Sodium 141 135 - 145 mmol/L   Potassium 3.8 3.5 - 5.1 mmol/L   Chloride 108 96 - 112 mmol/L   CO2 29 19 - 32 mmol/L   Glucose, Bld 105 (H) 70 - 99 mg/dL   BUN 7 6 - 23 mg/dL   Creatinine, Ser 0.69 0.50 - 1.10 mg/dL   Calcium 7.9 (L) 8.4 - 10.5 mg/dL   GFR calc non Af Amer 87 (L) >90 mL/min   GFR calc Af Amer >90 >90 mL/min    Comment: (NOTE) The eGFR has been calculated using the CKD EPI equation. This calculation has not been validated in all clinical situations. eGFR's persistently <90 mL/min signify possible Chronic Kidney Disease.    Anion gap 4 (L) 5 - 15  CBC WITH DIFFERENTIAL     Status: Abnormal   Collection Time: 01/21/15  5:05 AM  Result Value Ref Range   WBC 12.1 (H) 4.0 - 10.5 K/uL   RBC 3.63 (L) 3.87 - 5.11 MIL/uL   Hemoglobin 11.9 (L) 12.0 - 15.0 g/dL   HCT 34.8 (L) 36.0 - 46.0 %   MCV 95.9 78.0 - 100.0 fL   MCH 32.8 26.0 - 34.0 pg   MCHC 34.2 30.0 - 36.0 g/dL   RDW 12.8 11.5 - 15.5 %   Platelets 187 150 - 400 K/uL   Neutrophils Relative % 71 43 - 77 %   Neutro Abs 8.6 (H) 1.7 - 7.7 K/uL   Lymphocytes Relative 22 12 - 46 %   Lymphs Abs 2.6 0.7 - 4.0 K/uL   Monocytes Relative 7 3 - 12 %   Monocytes Absolute 0.9 0.1 - 1.0 K/uL   Eosinophils Relative 0 0 - 5 %   Eosinophils Absolute 0.0 0.0 - 0.7 K/uL   Basophils Relative 0 0 - 1 %   Basophils Absolute 0.0 0.0 - 0.1 K/uL  Platelet inhibition p2y12     Status: None   Collection Time: 01/21/15  7:55 AM  Result Value Ref Range   Platelet Function  P2Y12 212 194 - 418 PRU  Comment:        The literature has shown a direct correlation of PRU values over 230 with higher  risks of thrombotic events.  Lower PRU values are associated with platelet inhibition.   Hemoglobin A1c     Status: Abnormal   Collection Time: 01/21/15  8:20 AM  Result Value Ref Range   Hgb A1c MFr Bld 6.0 (H) 4.8 - 5.6 %    Comment: (NOTE)         Pre-diabetes: 5.7 - 6.4         Diabetes: >6.4         Glycemic control for adults with diabetes: <7.0    Mean Plasma Glucose 126 mg/dL    Comment: (NOTE) Performed At: [facility] [place], [place] [id number] Lindon Romp MD JS:[id number]   Urinalysis, Routine w reflex microscopic     Status: Abnormal   Collection Time: 01/21/15  8:20 AM  Result Value Ref Range   Color, Urine YELLOW YELLOW   APPearance CLEAR CLEAR   Specific Gravity, Urine 1.007 1.005 - 1.030   pH 6.5 5.0 - 8.0   Glucose, UA NEGATIVE NEGATIVE mg/dL   Hgb urine dipstick TRACE (A) NEGATIVE   Bilirubin Urine NEGATIVE NEGATIVE   Ketones, ur NEGATIVE NEGATIVE mg/dL   Protein, ur NEGATIVE NEGATIVE mg/dL   Urobilinogen, UA 0.2 0.0 - 1.0 mg/dL   Nitrite NEGATIVE NEGATIVE   Leukocytes, UA NEGATIVE NEGATIVE  Urine microscopic-add on     Status: None   Collection Time: 01/21/15  8:20 AM  Result Value Ref Range   Squamous Epithelial / LPF RARE RARE    Comment: LESS THAN 10 mL OF URINE SUBMITTED   WBC, UA 0-2 <3 WBC/hpf   RBC / HPF 0-2 <3 RBC/hpf   Bacteria, UA RARE RARE  CBC     Status: Abnormal   Collection Time: 01/21/15 11:45 AM  Result Value Ref Range   WBC 12.3 (H) 4.0 - 10.5 K/uL   RBC 3.85 (L) 3.87 - 5.11 MIL/uL   Hemoglobin 12.7 12.0 - 15.0 g/dL   HCT 37.3 36.0 - 46.0 %   MCV 96.9 78.0 - 100.0 fL   MCH 33.0 26.0 - 34.0 pg   MCHC 34.0 30.0 - 36.0 g/dL   RDW 12.9 11.5 - 15.5 %   Platelets 189 150 - 400 K/uL  Creatinine, serum     Status: Abnormal   Collection Time: 01/21/15 11:45 AM  Result Value Ref Range   Creatinine, Ser 0.84 0.50 - 1.10 mg/dL   GFR calc non Af Amer 70 (L) >90 mL/min   GFR calc Af Amer 81 (L) >90 mL/min     Comment: (NOTE) The eGFR has been calculated using the CKD EPI equation. This calculation has not been validated in all clinical situations. eGFR's persistently <90 mL/min signify possible Chronic Kidney Disease.   Lipid panel     Status: Abnormal   Collection Time: 01/22/15 12:00 AM  Result Value Ref Range   Cholesterol 116 0 - 200 mg/dL   Triglycerides 146 <150 mg/dL   HDL 34 (L) >39 mg/dL   Total CHOL/HDL Ratio 3.4 RATIO   VLDL 29 0 - 40 mg/dL   LDL Cholesterol 53 0 - 99 mg/dL    Comment:        Total Cholesterol/HDL:CHD Risk Coronary Heart Disease Risk Table                     Men  Women  1/2 Average Risk   3.4   3.3  Average Risk       5.0   4.4  2 X Average Risk   9.6   7.1  3 X Average Risk  23.4   11.0        Use the calculated Patient Ratio above and the CHD Risk Table to determine the patient's CHD Risk.        ATP III CLASSIFICATION (LDL):  <100     mg/dL   Optimal  100-129  mg/dL   Near or Above                    Optimal  130-159  mg/dL   Borderline  160-189  mg/dL   High  >190     mg/dL   Very High    Ct Angio Head W/cm &/or Wo Cm  01/20/2015   CLINICAL DATA:  Pipeline stent placed for left MCA aneurysm. Postoperative complaints of left arm and leg weakness.  EXAM: CT ANGIOGRAPHY HEAD AND NECK  TECHNIQUE: Multidetector CT imaging of the head and neck was performed using the standard protocol during bolus administration of intravenous contrast. Multiplanar CT image reconstructions and MIPs were obtained to evaluate the vascular anatomy. Carotid stenosis measurements (when applicable) are obtained utilizing NASCET criteria, using the distal internal carotid diameter as the denominator.  CONTRAST:  73mL OMNIPAQUE IOHEXOL 350 MG/ML SOLN  COMPARISON:  Angiography same day.  CTA 12/03/2014.  FINDINGS: CT HEAD  Brain: Previous core allowing upper right vertebral aneurysm. Previous clipping of the right MCA aneurysm. Placement of pipeline stent in the left MCA region  today. No evidence of intracranial hemorrhage. 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 No sign of acute infarction by CT.  Calvarium and skull base: Postoperative changes.  Nothing a acute.  Paranasal sinuses: Clear  CTA NECK  Aortic arch: There is advanced atherosclerotic disease of the arch with calcified plaque in either soft plaque or thrombus along the inferior margin of the arch. 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 Branching pattern of brachiocephalic vessels from the arch is normal without origin stenosis.  Right carotid system: Right common carotid artery widely patent to the bifurcation. Ordinary mild atherosclerotic disease at the carotid bifurcation without stenosis or irregularity. Cervical internal carotid artery widely patent.  Left carotid system: Left common carotid artery affected by diffuse atherosclerotic disease. Narrowing in the proximal portion of 40%. Vessel patent to the bifurcation with mild atherosclerotic change at the bifurcation but no stenosis or irregularity.  Vertebral arteries:Right vertebral artery dominant. Both vertebral artery origins are widely patent. Both vessels are patent through the cervical region to the foramen magnum.  Skeleton: Ordinary cervical spondylosis  Other neck: No significant soft tissue lesion.  CTA HEAD  Anterior circulation: Both carotid siphon regions widely patent. On the right, the anterior and middle cerebral vessels are patent. There is artifact related to previous MCA aneurysm clipping. I do not see any missing vessels on the right compared to the previous study of 12/03/2014. On the left, pipeline stent is in place beginning at the M1 segment and extending into 1 of the left MCA branches. This spans the location of the fusiform aneurysm of the MCA branch, which measures 2.6 x 5.4 mm. No missing branch vessels are seen on the left. One could question if there is mild spasm of  the MCA branch of  medially beyond the and of the stent. Beyond that area, the vessel appears normal as it did before.  Posterior circulation: The right vertebral artery is a large vessel widely patent to the basilar. 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 The left vertebral artery is a small vessel that largely terminates in PICA. There is not definitely any flow beyond PICA to the basilar. The basilar artery shows mild atherosclerotic irregularity but no flow-limiting stenosis. Superior cerebellar and posterior cerebral vessels are patent and normal.  Venous sinuses: Patent and normal.  Anatomic variants: No insignificant  Delayed phase: No significant finding  IMPRESSION: Pipeline stent placed in the left MCA spanning a left MCA aneurysm. No complications seen relative to that. No missing vessels. No hemorrhage. One could question mild spasm of the MCA branch just beyond the end of the stent, but the vessel is widely patent beyond that and appears as it did before.  Previously clipped right MCA region aneurysm. No missing vessels demonstrated on the right compared to the study of 12/03/2014.  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   Electronically Signed   By: Nelson Chimes M.D.   On: 01/20/2015 17:50   Ct Head Wo Contrast  01/22/2015   CLINICAL DATA:  Status post pipeline stent placed 01/20/2015, postoperative left arm end like weakness  EXAM: CT HEAD WITHOUT CONTRAST  TECHNIQUE: Contiguous axial images were obtained from the base of the skull through the vertex without intravenous contrast.  COMPARISON:  01/20/2015  FINDINGS: Postoperative changes are noted in the calvarium on the right consistent with prior aneurysm clipping. There are changes consistent with aneurysm clipping lung course of the right middle cerebral artery as well as prior coiling along the course of the right posterior inferior cerebellar artery. These are  stable from the prior exam. A new left middle cerebral artery pipeline stent is again noted.  A rounded area of decreased attenuation is noted within the right thalamus measuring approximately 13 mm. This has progressed in the interval from the prior exam consistent with an evolving area of ischemia. No other areas of acute hemorrhage, acute infarction or acute ischemia are noted.  IMPRESSION: Changes consistent with prior aneurysm coiling, clipping and stenting as described.  New rounded area of decreased attenuation in the right thalamus laterally consistent with an evolving area of ischemia.  These results will be called to the ordering clinician or representative by the Radiologist Assistant, and communication documented in the PACS or zVision Dashboard.   Electronically Signed   By: Inez Catalina M.D.   On: 01/22/2015 07:33   Ct Head Wo Contrast  01/20/2015   CLINICAL DATA:  Pipeline stent placed for left MCA aneurysm. Postoperative complaints of left arm and leg weakness.  EXAM: CT ANGIOGRAPHY HEAD AND NECK  TECHNIQUE: Multidetector CT imaging of the head and neck was performed using the standard protocol during bolus administration of intravenous contrast. Multiplanar CT image reconstructions and MIPs were obtained to evaluate the vascular anatomy. Carotid stenosis measurements (when applicable) are obtained utilizing NASCET criteria, using the distal internal carotid diameter as the denominator.  CONTRAST:  67mL OMNIPAQUE IOHEXOL 350 MG/ML SOLN  COMPARISON:  Angiography same day.  CTA 12/03/2014.  FINDINGS: CT HEAD  Brain: Previous core allowing upper right vertebral aneurysm. Previous clipping of the right MCA aneurysm. Placement of pipeline stent in the left MCA region today. No evidence of intracranial hemorrhage. 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 No sign  of acute infarction by CT.  Calvarium and skull base: Postoperative changes.  Nothing a acute.  Paranasal sinuses: Clear  CTA NECK   Aortic arch: There is advanced atherosclerotic disease of the arch with calcified plaque in either soft plaque or thrombus along the inferior margin of the arch. 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 Branching pattern of brachiocephalic vessels from the arch is normal without origin stenosis.  Right carotid system: Right common carotid artery widely patent to the bifurcation. Ordinary mild atherosclerotic disease at the carotid bifurcation without stenosis or irregularity. Cervical internal carotid artery widely patent.  Left carotid system: Left common carotid artery affected by diffuse atherosclerotic disease. Narrowing in the proximal portion of 40%. Vessel patent to the bifurcation with mild atherosclerotic change at the bifurcation but no stenosis or irregularity.  Vertebral arteries:Right vertebral artery dominant. Both vertebral artery origins are widely patent. Both vessels are patent through the cervical region to the foramen magnum.  Skeleton: Ordinary cervical spondylosis  Other neck: No significant soft tissue lesion.  CTA HEAD  Anterior circulation: Both carotid siphon regions widely patent. On the right, the anterior and middle cerebral vessels are patent. There is artifact related to previous MCA aneurysm clipping. I do not see any missing vessels on the right compared to the previous study of 12/03/2014. On the left, pipeline stent is in place beginning at the M1 segment and extending into 1 of the left MCA branches. This spans the location of the fusiform aneurysm of the MCA branch, which measures 2.6 x 5.4 mm. No missing branch vessels are seen on the left. One could question if there is mild spasm of the MCA branch of medially beyond the and of the stent. Beyond that area, the vessel appears normal as it did before.  Posterior circulation: The right vertebral artery is a large vessel widely patent to the basilar. Previously coiled right  vertebral aneurysm without evidence of recanalization. The left vertebral artery is a small vessel that largely terminates in PICA. There is not definitely any flow beyond PICA to the basilar. The basilar artery shows mild atherosclerotic irregularity but no flow-limiting stenosis. Superior cerebellar and posterior cerebral vessels are patent and normal.  Venous sinuses: Patent and normal.  Anatomic variants: No insignificant  Delayed phase: No significant finding  IMPRESSION: Pipeline stent placed in the left MCA spanning a left MCA aneurysm. No complications seen relative to that. No missing vessels. No hemorrhage. One could question mild spasm of the MCA branch just beyond the end of the stent, but the vessel is widely patent beyond that and appears as it did before.  Previously clipped right MCA region aneurysm. No missing vessels demonstrated on the right compared to the study of 12/03/2014.  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   Electronically Signed   By: Paulina Fusi M.D.   On: 01/20/2015 17:50   Ct Angio Neck W/cm &/or Wo/cm  01/20/2015   CLINICAL DATA:  Pipeline stent placed for left MCA aneurysm. Postoperative complaints of left arm and leg weakness.  EXAM: CT ANGIOGRAPHY HEAD AND NECK  TECHNIQUE: Multidetector CT imaging of the head and neck was performed using the standard protocol during bolus administration of intravenous contrast. Multiplanar CT image reconstructions and MIPs were obtained to evaluate the vascular anatomy. Carotid stenosis measurements (when applicable) are obtained utilizing NASCET criteria, using the distal internal carotid diameter as the denominator.  CONTRAST:  79mL OMNIPAQUE IOHEXOL 350 MG/ML SOLN  COMPARISON:  Angiography same day.  CTA 12/03/2014.  FINDINGS: CT HEAD  Brain: Previous core allowing upper right vertebral aneurysm. Previous clipping of the right MCA aneurysm. Placement of pipeline stent in the left MCA region today.  No evidence of intracranial hemorrhage. 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 No sign of acute infarction by CT.  Calvarium and skull base: Postoperative changes.  Nothing a acute.  Paranasal sinuses: Clear  CTA NECK  Aortic arch: There is advanced atherosclerotic disease of the arch with calcified plaque in either soft plaque or thrombus along the inferior margin of the arch. 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 Branching pattern of brachiocephalic vessels from the arch is normal without origin stenosis.  Right carotid system: Right common carotid artery widely patent to the bifurcation. Ordinary mild atherosclerotic disease at the carotid bifurcation without stenosis or irregularity. Cervical internal carotid artery widely patent.  Left carotid system: Left common carotid artery affected by diffuse atherosclerotic disease. Narrowing in the proximal portion of 40%. Vessel patent to the bifurcation with mild atherosclerotic change at the bifurcation but no stenosis or irregularity.  Vertebral arteries:Right vertebral artery dominant. Both vertebral artery origins are widely patent. Both vessels are patent through the cervical region to the foramen magnum.  Skeleton: Ordinary cervical spondylosis  Other neck: No significant soft tissue lesion.  CTA HEAD  Anterior circulation: Both carotid siphon regions widely patent. On the right, the anterior and middle cerebral vessels are patent. There is artifact related to previous MCA aneurysm clipping. I do not see any missing vessels on the right compared to the previous study of 12/03/2014. On the left, pipeline stent is in place beginning at the M1 segment and extending into 1 of the left MCA branches. This spans the location of the fusiform aneurysm of the MCA branch, which measures 2.6 x 5.4 mm. No missing branch vessels are seen on the left. One could question if there is mild spasm of the MCA  branch of medially beyond the and of the stent. Beyond that area, the vessel appears normal as it did before.  Posterior circulation: The right vertebral artery is a large vessel widely patent to the basilar. 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 The left vertebral artery is a small vessel that largely terminates in PICA. There is not definitely any flow beyond PICA to the basilar. The basilar artery shows mild atherosclerotic irregularity but no flow-limiting stenosis. Superior cerebellar and posterior cerebral vessels are patent and normal.  Venous sinuses: Patent and normal.  Anatomic variants: No insignificant  Delayed phase: No significant finding  IMPRESSION: Pipeline stent placed in the left MCA spanning a left MCA aneurysm. No complications seen relative to that. No missing vessels. No hemorrhage. One could question mild spasm of the MCA branch just beyond the end of the stent, but the vessel is widely patent beyond that and appears as it did before.  Previously clipped right MCA region aneurysm. No missing vessels demonstrated on the right compared to the study of 12/03/2014.  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   Electronically Signed   By: Nelson Chimes M.D.   On: 01/20/2015 17:50   Ir Transcath/emboliz  01/22/2015   CLINICAL DATA:  Progressive headaches. History of multiple intracranial aneurysms. 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  EXAM: TRANSCATHETER THERAPY EMBOLIZATION OF LEFT MIDDLE CEREBRAL ARTERY REGION ANEURYSM  ANESTHESIA/SEDATION: General anesthesia.  MEDICATIONS: As per general anesthesia.  CONTRAST:  143mL OMNIPAQUE IOHEXOL 300 MG/ML  SOLN  PROCEDURE: Following a full explanation of the procedure along with the potential  associated complications, an informed witnessed consent was obtained.  The right groin was prepped and draped in the usual sterile fashion. Thereafter using modified Seldinger technique, transfemoral access into right common femoral artery was obtained without difficulty. Over a 0.035 inch guidewire, a 5 French Pinnacle sheath was inserted. Through this, and also over a 0.035 inch guidewire, 5 French JB1 catheter was advanced to the aortic arch region and selectively positioned in the right common carotid artery, the right vertebral artery, and the left common carotid artery. The patient tolerated the procedure well. A 3D rotational arteriogram was also performed of the left intracranial circulation from a left-sided common carotid artery injection. Reconstructions were performed on a separate workstation.  COMPLICATIONS: None immediate.  FINDINGS: The right common carotid arteriogram demonstrates the right external carotid artery and its major branches to be widely patent.  The right internal carotid artery at the bulb and its proximal one-third is patent.  There is a modest kink at the junction of the middle and the one-third of the right internal carotid artery without impedance of any blood flow distally.  More distally the vessel is seen to opacify normally to the cranial skull base.  The petrous, cavernous and supraclinoid segments are widely patent.  The right middle and the right anterior cerebral arteries are seen to opacify normally into the capillary and venous phases.  The right pericallosal artery is seen to cross the midline and supply the distal anterior cerebral artery distribution on the left.  Also seen are the previously positioned clips in the right middle cerebral artery distribution bifurcation. There is a mild fusiform prominence noted proximal to the clips at the inferior division of the right middle cerebral artery.  However, no angiographic change is noted from the previous catheter  angiogram.  The vertebral artery origin is normal.  This is a dominant vertebral which it is seen to opacify to the cranial skull base. The previously endovascularly coiled right posterior-inferior cerebellar artery remains completely obliterated without evidence of recanalization or of coil compaction.  The right posterior-inferior cerebellar artery remains widely patent.  The distal basilar artery, the posterior cerebral arteries, the superior cerebellar arteries and the anterior inferior cerebellar arteries opacify normally into the capillary and venous phases.  The left common carotid arteriogram demonstrates the left external carotid artery and its major branches to be normal.  The left internal carotid artery at the bulb to the cranial skull base opacifies normally.  The petrous, the cavernous and the supraclinoid segments are widely patent. The left middle cerebral artery and the left anterior cerebral artery are seen to opacify normally into the capillary and venous phases.  Arising in the region of the superior and the inferior divisions of the left middle cerebral artery again seen is a saccular outpouching projecting inferiorly and slightly anteriorly.  A 3D rotational arteriogram performed with reconstruction on a separate workstation confirms the presence of a wide neck lobulated aneurysm measuring approximately 5 mm x 4.5 mm. This is a wide neck which extends mostly into the origin of the inferior division.  ENDOVASCULAR STAGED EMBOLIZATION OF WIDE NECK LOBULATED LEFT MCA BIFURCATION ANEURYSM USING THE  LVIS JR STENT.  The angiographic findings were reviewed with the patient and the patient's family. Informed consent was obtained. The patient was then put under general anesthesia by the Department of Anesthesiology at [place] catheter in the left common carotid artery was then exchanged over a 0.035 inch 300 cm Constance Holster exchange guidewire for a 6 French 80 cm Cook shuttle  sheath using biplane roadmap technique and constant fluoroscopic guidance. Good aspiration was obtained from the side port of the Tuohy-Borst at the hub of the [place] shuttle sheath. A gentle contrast injection demonstrated no evidence of spasms, dissections or intraluminal filling defects at the distal end of the guide catheter in the left common carotid artery. This was then connected to continuous heparinized saline infusion.  A 6 French 115 cm Navien guide catheter was then advanced just distal to the tip of the [place] shuttle sheath in the distal left common carotid artery. The guidewire was removed. Good aspiration was then obtained from the hub of the 6 [place] Navien guide catheter. A gentle contrast injection revealed no evidence of spasms, dissections or of intraluminal filling defects.  Over a 0.035 inch Roadrunner guidewire, using biplane roadmap technique, the 6 [place] Navien guide catheter was then advanced to the distal cervical left ICA and positioned at the cervical petrous junction. The guidewire was removed. Good aspiration was obtained from the hub of the 6 [place] Navien guide catheter.  At this time in a coaxial manner and with constant heparinized saline infusion using biplane roadmap technique and constant fluoroscopic guidance, over a 0.014 inch Softip Synchro micro guidewire, a Headway 17 2 tip micro catheter which had been steam-shaped was then advanced to the distal end of the 6 [place] Navien guide catheter. The micro guidewire was then gently manipulated using a torque device into the left middle cerebral artery M1 segment followed by the micro catheter. The micro guidewire was then advanced and positioned just proximal to the level of the aneurysm neck at the origin of the inferior division of the left middle cerebral artery. Multiple attempts were then made to advance the micro guidewire with different distal configurations into the inferior divisions. All of these were  unsuccessful.  The micro catheter was then advanced to the M2 M3 region of the superior division of the left middle cerebral artery. The guidewire was removed. Good aspiration was obtained from the hub of the Headway micro catheter. This was then connected to continuous heparinized saline infusion.  Through the second port of the Tuohy-Borst at the hub of the 6 [place] Navien guide catheter, an SL 10 2-tip micro catheter with a 45 degree angulation was then advanced over 0.0148 inch Softip Synchro micro guidewire to the distal end of the 6 [place] Navien guide catheter in the left internal carotid artery. With the micro guidewire leading with a J-tip configuration, the combination was then advanced to the left middle cerebral artery M1 segment. The micro guidewire was then advanced into the aneurysm followed by the SL 10 45 degrees angle micro catheter. The micro guidewire was advanced within the aneurysm under constant fluoroscopic guidance gingerly such that the tip of the wire was now pointing into the origin of the inferior division of the left middle cerebral artery. This was then followed by the micro catheter. The micro guidewire was then gently manipulated with access obtained into the inferior division. The wire was then advanced distally into the inferior division followed by the SL 10  micro catheter carefully. There was free advancement of the micro catheter to the M3 region of the left middle cerebral artery inferior division. The micro guidewire was then removed. Good aspiration was obtained from the hub of the SL 10 micro catheter. A gentle contrast injection demonstrated antegrade flow distally. This was then connected to continuous heparinized saline infusion.  At this time a 2.5 mm x 34 mm LVIS Jr stent with a working length of 30 mm was then advanced in a coaxial manner and with constant heparinized saline infusion using biplane roadmap technique and constant fluoroscopic guidance to the distal end  of the SL 10 micro catheter. The O ring on the delivery micro guidewire and the delivery micro catheter were then gently loosened. With slight forward gentle traction with the right hand on the delivery micro guidewire, with the left hand the SL 10 micro catheter was gently retrieved in a controlled gradual slow fashion unsheathing the LVIS stent.  This was continued until the entire stent was deployed. A control arteriogram performed through the 6 [place] Navien guide catheter demonstrated excellent apposition in the left middle cerebral artery M1 segment and the M2 region of the inferior division of the left middle cerebral artery. However, there was significant narrowing noted in the stent just distal to its entry into the inferior division.  This was then subsequently dilated with a 0.012 inch J-tipped Headliner micro guidewire which was advanced to the focal narrowing within the stent followed by the micro catheter. The micro guidewire was then gingerly advanced to and fro at the site of the superior stenosis. The wire was then retrieved proximally as was the micro catheter. A control arteriogram performed through the 6 [place] Navien guide catheter demonstrated significantly improved caliber of the stent at this point with excellent flow into the vessels.  Control arteriograms were then performed at 15, 30 and 40 minutes post stent deployment. These continued to demonstrate excellent flow. There was suspicion of a small filling defect along the distal aspect of the LVIS stent. This prompted the use of approximately 4 mg of superselective intracranial Integrelin over about 4 minutes.  Control arteriograms continued to demonstrate excellent flow with opening of the LVIS stent within the aneurysm itself.  A final control arteriogram performed through the 6 [place] Navien guide catheter continued to demonstrate excellent flow without any intraluminal filling defects.  This was then retrieved into the abdominal aorta  along with the [place] shuttle sheath. This was then exchanged out for a 6 [place] Pinnacle sheath over a 0.035 inch guidewire. The sheath itself was then removed with the successful application of an external closure device.  The patient's ACT throughout the procedure was maintained in the region of 180 seconds.  No acute neurological or hemodynamic changes were seen throughout the procedure.  The patient's general anesthesia was then reversed and the patient was extubated. Upon recovery the patient demonstrated no new neurological signs or symptoms. Memory and orientation remained stable as did her motor, sensory and coordination function. She was then transferred to the neuro ICU to continue her IV heparin and to control her blood pressure on IV Cardene within the parameters.  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   Electronically Signed   By: Luanne Bras M.D.   On: 01/21/2015 13:30   Ir Angiogram Follow Up Study  01/22/2015  CLINICAL DATA:  Progressive headaches. History of multiple intracranial aneurysms. 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  EXAM: TRANSCATHETER THERAPY EMBOLIZATION OF LEFT MIDDLE CEREBRAL ARTERY REGION ANEURYSM  ANESTHESIA/SEDATION: General anesthesia.  MEDICATIONS: As per general anesthesia.  CONTRAST:  173mL OMNIPAQUE IOHEXOL 300 MG/ML  SOLN  PROCEDURE: Following a full explanation of the procedure along with the potential associated complications, an informed witnessed consent was obtained.  The right groin was prepped and draped in the usual sterile fashion. Thereafter using modified Seldinger technique, transfemoral access into right  common femoral artery was obtained without difficulty. Over a 0.035 inch guidewire, a 5 French Pinnacle sheath was inserted. Through this, and also over a 0.035 inch guidewire, 5 French JB1 catheter was advanced to the aortic arch region and selectively positioned in the right common carotid artery, the right vertebral artery, and the left common carotid artery. The patient tolerated the procedure well. A 3D rotational arteriogram was also performed of the left intracranial circulation from a left-sided common carotid artery injection. Reconstructions were performed on a separate workstation.  COMPLICATIONS: None immediate.  FINDINGS: The right common carotid arteriogram demonstrates the right external carotid artery and its major branches to be widely patent.  The right internal carotid artery at the bulb and its proximal one-third is patent.  There is a modest kink at the junction of the middle and the one-third of the right internal carotid artery without impedance of any blood flow distally.  More distally the vessel is seen to opacify normally to the cranial skull base.  The petrous, cavernous and supraclinoid segments are widely patent.  The right middle and the right anterior cerebral arteries are seen to opacify normally into the capillary and venous phases.  The right pericallosal artery is seen to cross the midline and supply the distal anterior cerebral artery distribution on the left.  Also seen are the previously positioned clips in the right middle cerebral artery distribution bifurcation. There is a mild fusiform prominence noted proximal to the clips at the inferior division of the right middle cerebral artery.  However, no angiographic change is noted from the previous catheter angiogram.  The vertebral artery origin is normal.  This is a dominant vertebral which it is seen to opacify to the cranial skull base. The previously endovascularly coiled right posterior-inferior cerebellar artery remains  completely obliterated without evidence of recanalization or of coil compaction.  The right posterior-inferior cerebellar artery remains widely patent.  The distal basilar artery, the posterior cerebral arteries, the superior cerebellar arteries and the anterior inferior cerebellar arteries opacify normally into the capillary and venous phases.  The left common carotid arteriogram demonstrates the left external carotid artery and its major branches to be normal.  The left internal carotid artery at the bulb to the cranial skull base opacifies normally.  The petrous, the cavernous and the supraclinoid segments are widely patent. The left middle cerebral artery and the left anterior cerebral artery are seen to opacify normally into the capillary and venous phases.  Arising in the region of the superior and the inferior divisions of the left middle cerebral artery again seen is a saccular outpouching projecting inferiorly and slightly anteriorly.  A 3D rotational arteriogram performed with reconstruction on a separate workstation confirms the presence of a wide neck lobulated aneurysm measuring approximately 5 mm x 4.5 mm. This is a wide  neck which extends mostly into the origin of the inferior division.  ENDOVASCULAR STAGED EMBOLIZATION OF WIDE NECK LOBULATED LEFT MCA BIFURCATION ANEURYSM USING THE LVIS JR STENT.  The angiographic findings were reviewed with the patient and the patient's family. Informed consent was obtained. The patient was then put under general anesthesia by the Department of Anesthesiology at [place] catheter in the left common carotid artery was then exchanged over a 0.035 inch 300 cm Constance Holster exchange guidewire for a 6 French 80 cm Cook shuttle sheath using biplane roadmap technique and constant fluoroscopic guidance. Good aspiration was obtained from the side port of the Tuohy-Borst at the hub of the [place] shuttle sheath. A gentle contrast injection  demonstrated no evidence of spasms, dissections or intraluminal filling defects at the distal end of the guide catheter in the left common carotid artery. This was then connected to continuous heparinized saline infusion.  A 6 French 115 cm Navien guide catheter was then advanced just distal to the tip of the [place] shuttle sheath in the distal left common carotid artery. The guidewire was removed. Good aspiration was then obtained from the hub of the 6 [place] Navien guide catheter. A gentle contrast injection revealed no evidence of spasms, dissections or of intraluminal filling defects.  Over a 0.035 inch Roadrunner guidewire, using biplane roadmap technique, the 6 [place] Navien guide catheter was then advanced to the distal cervical left ICA and positioned at the cervical petrous junction. The guidewire was removed. Good aspiration was obtained from the hub of the 6 [place] Navien guide catheter.  At this time in a coaxial manner and with constant heparinized saline infusion using biplane roadmap technique and constant fluoroscopic guidance, over a 0.014 inch Softip Synchro micro guidewire, a Headway 17 2 tip micro catheter which had been steam-shaped was then advanced to the distal end of the 6 [place] Navien guide catheter. The micro guidewire was then gently manipulated using a torque device into the left middle cerebral artery M1 segment followed by the micro catheter. The micro guidewire was then advanced and positioned just proximal to the level of the aneurysm neck at the origin of the inferior division of the left middle cerebral artery. Multiple attempts were then made to advance the micro guidewire with different distal configurations into the inferior divisions. All of these were unsuccessful.  The micro catheter was then advanced to the M2 M3 region of the superior division of the left middle cerebral artery. The guidewire was removed. Good aspiration was obtained from the hub of the Headway micro  catheter. This was then connected to continuous heparinized saline infusion.  Through the second port of the Tuohy-Borst at the hub of the 6 [place] Navien guide catheter, an SL 10 2-tip micro catheter with a 45 degree angulation was then advanced over 0.0148 inch Softip Synchro micro guidewire to the distal end of the 6 [place] Navien guide catheter in the left internal carotid artery. With the micro guidewire leading with a J-tip configuration, the combination was then advanced to the left middle cerebral artery M1 segment. The micro guidewire was then advanced into the aneurysm followed by the SL 10 45 degrees angle micro catheter. The micro guidewire was advanced within the aneurysm under constant fluoroscopic guidance gingerly such that the tip of the wire was now pointing into the origin of the inferior division of the left middle cerebral artery. This was then followed by the micro catheter. The micro guidewire was  then gently manipulated with access obtained into the inferior division. The wire was then advanced distally into the inferior division followed by the SL 10 micro catheter carefully. There was free advancement of the micro catheter to the M3 region of the left middle cerebral artery inferior division. The micro guidewire was then removed. Good aspiration was obtained from the hub of the SL 10 micro catheter. A gentle contrast injection demonstrated antegrade flow distally. This was then connected to continuous heparinized saline infusion.  At this time a 2.5 mm x 34 mm LVIS Jr stent with a working length of 30 mm was then advanced in a coaxial manner and with constant heparinized saline infusion using biplane roadmap technique and constant fluoroscopic guidance to the distal end of the SL 10 micro catheter. The O ring on the delivery micro guidewire and the delivery micro catheter were then gently loosened. With slight forward gentle traction with the right hand on the delivery micro guidewire, with  the left hand the SL 10 micro catheter was gently retrieved in a controlled gradual slow fashion unsheathing the LVIS stent.  This was continued until the entire stent was deployed. A control arteriogram performed through the 6 [place] Navien guide catheter demonstrated excellent apposition in the left middle cerebral artery M1 segment and the M2 region of the inferior division of the left middle cerebral artery. However, there was significant narrowing noted in the stent just distal to its entry into the inferior division.  This was then subsequently dilated with a 0.012 inch J-tipped Headliner micro guidewire which was advanced to the focal narrowing within the stent followed by the micro catheter. The micro guidewire was then gingerly advanced to and fro at the site of the superior stenosis. The wire was then retrieved proximally as was the micro catheter. A control arteriogram performed through the 6 [place] Navien guide catheter demonstrated significantly improved caliber of the stent at this point with excellent flow into the vessels.  Control arteriograms were then performed at 15, 30 and 40 minutes post stent deployment. These continued to demonstrate excellent flow. There was suspicion of a small filling defect along the distal aspect of the LVIS stent. This prompted the use of approximately 4 mg of superselective intracranial Integrelin over about 4 minutes.  Control arteriograms continued to demonstrate excellent flow with opening of the LVIS stent within the aneurysm itself.  A final control arteriogram performed through the 6 [place] Navien guide catheter continued to demonstrate excellent flow without any intraluminal filling defects.  This was then retrieved into the abdominal aorta along with the [place] shuttle sheath. This was then exchanged out for a 6 [place] Pinnacle sheath over a 0.035 inch guidewire. The sheath itself was then removed with the successful application of an external closure  device.  The patient's ACT throughout the procedure was maintained in the region of 180 seconds.  No acute neurological or hemodynamic changes were seen throughout the procedure.  The patient's general anesthesia was then reversed and the patient was extubated. Upon recovery the patient demonstrated no new neurological signs or symptoms. Memory and orientation remained stable as did her motor, sensory and coordination function. She was then transferred to the neuro ICU to continue her IV heparin and to control her blood pressure on IV Cardene within the parameters.  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  Electronically Signed   By: Luanne Bras M.D.   On: 01/21/2015 13:30   Ir 3d Primitivo Gauze Darreld Mclean  01/22/2015   CLINICAL DATA:  Progressive headaches. History of multiple intracranial aneurysms. 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  EXAM: TRANSCATHETER THERAPY EMBOLIZATION OF LEFT MIDDLE CEREBRAL ARTERY REGION ANEURYSM  ANESTHESIA/SEDATION: General anesthesia.  MEDICATIONS: As per general anesthesia.  CONTRAST:  160mL OMNIPAQUE IOHEXOL 300 MG/ML  SOLN  PROCEDURE: Following a full explanation of the procedure along with the potential associated complications, an informed witnessed consent was obtained.  The right groin was prepped and draped in the usual sterile fashion. Thereafter using modified Seldinger technique, transfemoral access into right common femoral artery was obtained without difficulty. Over a 0.035 inch guidewire, a 5 French Pinnacle sheath was inserted. Through this, and also over a 0.035 inch guidewire, 5 French JB1 catheter was advanced to the aortic arch  region and selectively positioned in the right common carotid artery, the right vertebral artery, and the left common carotid artery. The patient tolerated the procedure well. A 3D rotational arteriogram was also performed of the left intracranial circulation from a left-sided common carotid artery injection. Reconstructions were performed on a separate workstation.  COMPLICATIONS: None immediate.  FINDINGS: The right common carotid arteriogram demonstrates the right external carotid artery and its major branches to be widely patent.  The right internal carotid artery at the bulb and its proximal one-third is patent.  There is a modest kink at the junction of the middle and the one-third of the right internal carotid artery without impedance of any blood flow distally.  More distally the vessel is seen to opacify normally to the cranial skull base.  The petrous, cavernous and supraclinoid segments are widely patent.  The right middle and the right anterior cerebral arteries are seen to opacify normally into the capillary and venous phases.  The right pericallosal artery is seen to cross the midline and supply the distal anterior cerebral artery distribution on the left.  Also seen are the previously positioned clips in the right middle cerebral artery distribution bifurcation. There is a mild fusiform prominence noted proximal to the clips at the inferior division of the right middle cerebral artery.  However, no angiographic change is noted from the previous catheter angiogram.  The vertebral artery origin is normal.  This is a dominant vertebral which it is seen to opacify to the cranial skull base. The previously endovascularly coiled right posterior-inferior cerebellar artery remains completely obliterated without evidence of recanalization or of coil compaction.  The right posterior-inferior cerebellar artery remains widely patent.  The distal basilar artery, the posterior cerebral arteries, the superior  cerebellar arteries and the anterior inferior cerebellar arteries opacify normally into the capillary and venous phases.  The left common carotid arteriogram demonstrates the left external carotid artery and its major branches to be normal.  The left internal carotid artery at the bulb to the cranial skull base opacifies normally.  The petrous, the cavernous and the supraclinoid segments are widely patent. The left middle cerebral artery and the left anterior cerebral artery are seen to opacify normally into the capillary and venous phases.  Arising in the region of the superior and the inferior divisions of the left middle cerebral artery again seen is a saccular outpouching projecting inferiorly and slightly anteriorly.  A 3D rotational arteriogram performed with reconstruction  on a separate workstation confirms the presence of a wide neck lobulated aneurysm measuring approximately 5 mm x 4.5 mm. This is a wide neck which extends mostly into the origin of the inferior division.  ENDOVASCULAR STAGED EMBOLIZATION OF WIDE NECK LOBULATED LEFT MCA BIFURCATION ANEURYSM USING THE LVIS JR STENT.  The angiographic findings were reviewed with the patient and the patient's family. Informed consent was obtained. The patient was then put under general anesthesia by the Department of Anesthesiology at [place] catheter in the left common carotid artery was then exchanged over a 0.035 inch 300 cm Constance Holster exchange guidewire for a 6 French 80 cm [place] shuttle sheath using biplane roadmap technique and constant fluoroscopic guidance. Good aspiration was obtained from the side port of the Tuohy-Borst at the hub of the [place] shuttle sheath. A gentle contrast injection demonstrated no evidence of spasms, dissections or intraluminal filling defects at the distal end of the guide catheter in the left common carotid artery. This was then connected to continuous heparinized saline infusion.  A 6 French  115 cm Navien guide catheter was then advanced just distal to the tip of the [place] shuttle sheath in the distal left common carotid artery. The guidewire was removed. Good aspiration was then obtained from the hub of the 6 [place] Navien guide catheter. A gentle contrast injection revealed no evidence of spasms, dissections or of intraluminal filling defects.  Over a 0.035 inch Roadrunner guidewire, using biplane roadmap technique, the 6 [place] Navien guide catheter was then advanced to the distal cervical left ICA and positioned at the cervical petrous junction. The guidewire was removed. Good aspiration was obtained from the hub of the 6 [place] Navien guide catheter.  At this time in a coaxial manner and with constant heparinized saline infusion using biplane roadmap technique and constant fluoroscopic guidance, over a 0.014 inch Softip Synchro micro guidewire, a Headway 17 2 tip micro catheter which had been steam-shaped was then advanced to the distal end of the 6 [place] Navien guide catheter. The micro guidewire was then gently manipulated using a torque device into the left middle cerebral artery M1 segment followed by the micro catheter. The micro guidewire was then advanced and positioned just proximal to the level of the aneurysm neck at the origin of the inferior division of the left middle cerebral artery. Multiple attempts were then made to advance the micro guidewire with different distal configurations into the inferior divisions. All of these were unsuccessful.  The micro catheter was then advanced to the M2 M3 region of the superior division of the left middle cerebral artery. The guidewire was removed. Good aspiration was obtained from the hub of the Headway micro catheter. This was then connected to continuous heparinized saline infusion.  Through the second port of the Tuohy-Borst at the hub of the 6 [place] Navien guide catheter, an SL 10 2-tip micro catheter with a 45 degree angulation was then  advanced over 0.0148 inch Softip Synchro micro guidewire to the distal end of the 6 [place] Navien guide catheter in the left internal carotid artery. With the micro guidewire leading with a J-tip configuration, the combination was then advanced to the left middle cerebral artery M1 segment. The micro guidewire was then advanced into the aneurysm followed by the SL 10 45 degrees angle micro catheter. The micro guidewire was advanced within the aneurysm under constant fluoroscopic guidance gingerly such that the tip of the wire was now pointing into  the origin of the inferior division of the left middle cerebral artery. This was then followed by the micro catheter. The micro guidewire was then gently manipulated with access obtained into the inferior division. The wire was then advanced distally into the inferior division followed by the SL 10 micro catheter carefully. There was free advancement of the micro catheter to the M3 region of the left middle cerebral artery inferior division. The micro guidewire was then removed. Good aspiration was obtained from the hub of the SL 10 micro catheter. A gentle contrast injection demonstrated antegrade flow distally. This was then connected to continuous heparinized saline infusion.  At this time a 2.5 mm x 34 mm LVIS Jr stent with a working length of 30 mm was then advanced in a coaxial manner and with constant heparinized saline infusion using biplane roadmap technique and constant fluoroscopic guidance to the distal end of the SL 10 micro catheter. The O ring on the delivery micro guidewire and the delivery micro catheter were then gently loosened. With slight forward gentle traction with the right hand on the delivery micro guidewire, with the left hand the SL 10 micro catheter was gently retrieved in a controlled gradual slow fashion unsheathing the LVIS stent.  This was continued until the entire stent was deployed. A control arteriogram performed through the 6 [place]  Navien guide catheter demonstrated excellent apposition in the left middle cerebral artery M1 segment and the M2 region of the inferior division of the left middle cerebral artery. However, there was significant narrowing noted in the stent just distal to its entry into the inferior division.  This was then subsequently dilated with a 0.012 inch J-tipped Headliner micro guidewire which was advanced to the focal narrowing within the stent followed by the micro catheter. The micro guidewire was then gingerly advanced to and fro at the site of the superior stenosis. The wire was then retrieved proximally as was the micro catheter. A control arteriogram performed through the 6 [place] Navien guide catheter demonstrated significantly improved caliber of the stent at this point with excellent flow into the vessels.  Control arteriograms were then performed at 15, 30 and 40 minutes post stent deployment. These continued to demonstrate excellent flow. There was suspicion of a small filling defect along the distal aspect of the LVIS stent. This prompted the use of approximately 4 mg of superselective intracranial Integrelin over about 4 minutes.  Control arteriograms continued to demonstrate excellent flow with opening of the LVIS stent within the aneurysm itself.  A final control arteriogram performed through the 6 [place] Navien guide catheter continued to demonstrate excellent flow without any intraluminal filling defects.  This was then retrieved into the abdominal aorta along with the [place] shuttle sheath. This was then exchanged out for a 6 [place] Pinnacle sheath over a 0.035 inch guidewire. The sheath itself was then removed with the successful application of an external closure device.  The patient's ACT throughout the procedure was maintained in the region of 180 seconds.  No acute neurological or hemodynamic changes were seen throughout the procedure.  The patient's general anesthesia was then reversed and  the patient was extubated. Upon recovery the patient demonstrated no new neurological signs or symptoms. Memory and orientation remained stable as did her motor, sensory and coordination function. She was then transferred to the neuro ICU to continue her IV heparin and to control her blood pressure on IV Cardene within the parameters.  IMPRESSION: Endovascular staged embolization of wide  neck lobulated complex aneurysm of the left middle cerebral artery bifurcation using the LVIS Jr stent device as described above without event.   Electronically Signed   By: Luanne Bras M.D.   On: 01/21/2015 13:30   Ir US Guide Vasc Access Right  01/22/2015   CLINICAL DATA:  Progressive headaches. History of multiple intracranial aneurysms. 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  EXAM: TRANSCATHETER THERAPY EMBOLIZATION OF LEFT MIDDLE CEREBRAL ARTERY REGION ANEURYSM  ANESTHESIA/SEDATION: General anesthesia.  MEDICATIONS: As per general anesthesia.  CONTRAST:  163mL OMNIPAQUE IOHEXOL 300 MG/ML  SOLN  PROCEDURE: Following a full explanation of the procedure along with the potential associated complications, an informed witnessed consent was obtained.  The right groin was prepped and draped in the usual sterile fashion. Thereafter using modified Seldinger technique, transfemoral access into right common femoral artery was obtained without difficulty. Over a 0.035 inch guidewire, a 5 French Pinnacle sheath was inserted. Through this, and also over a 0.035 inch guidewire, 5 French JB1 catheter was advanced to the aortic arch region and selectively positioned in the right common carotid artery, the right vertebral artery, and the left common carotid artery. The patient tolerated the procedure well. A 3D rotational arteriogram was also performed of  the left intracranial circulation from a left-sided common carotid artery injection. Reconstructions were performed on a separate workstation.  COMPLICATIONS: None immediate.  FINDINGS: The right common carotid arteriogram demonstrates the right external carotid artery and its major branches to be widely patent.  The right internal carotid artery at the bulb and its proximal one-third is patent.  There is a modest kink at the junction of the middle and the one-third of the right internal carotid artery without impedance of any blood flow distally.  More distally the vessel is seen to opacify normally to the cranial skull base.  The petrous, cavernous and supraclinoid segments are widely patent.  The right middle and the right anterior cerebral arteries are seen to opacify normally into the capillary and venous phases.  The right pericallosal artery is seen to cross the midline and supply the distal anterior cerebral artery distribution on the left.  Also seen are the previously positioned clips in the right middle cerebral artery distribution bifurcation. There is a mild fusiform prominence noted proximal to the clips at the inferior division of the right middle cerebral artery.  However, no angiographic change is noted from the previous catheter angiogram.  The vertebral artery origin is normal.  This is a dominant vertebral which it is seen to opacify to the cranial skull base. The previously endovascularly coiled right posterior-inferior cerebellar artery remains completely obliterated without evidence of recanalization or of coil compaction.  The right posterior-inferior cerebellar artery remains widely patent.  The distal basilar artery, the posterior cerebral arteries, the superior cerebellar arteries and the anterior inferior cerebellar arteries opacify normally into the capillary and venous phases.  The left common carotid arteriogram demonstrates the left external carotid artery and its major branches to be  normal.  The left internal carotid artery at the bulb to the cranial skull base opacifies normally.  The petrous, the cavernous and the supraclinoid segments are widely patent. The left middle cerebral artery and the left anterior cerebral artery are seen to opacify normally into the capillary and venous phases.  Arising in the region of the superior and the inferior  divisions of the left middle cerebral artery again seen is a saccular outpouching projecting inferiorly and slightly anteriorly.  A 3D rotational arteriogram performed with reconstruction on a separate workstation confirms the presence of a wide neck lobulated aneurysm measuring approximately 5 mm x 4.5 mm. This is a wide neck which extends mostly into the origin of the inferior division.  ENDOVASCULAR STAGED EMBOLIZATION OF WIDE NECK LOBULATED LEFT MCA BIFURCATION ANEURYSM USING THE LVIS JR STENT.  The angiographic findings were reviewed with the patient and the patient's family. Informed consent was obtained. The patient was then put under general anesthesia by the Department of Anesthesiology at [place] catheter in the left common carotid artery was then exchanged over a 0.035 inch 300 cm Constance Holster exchange guidewire for a 6 French 80 cm [place] shuttle sheath using biplane roadmap technique and constant fluoroscopic guidance. Good aspiration was obtained from the side port of the Tuohy-Borst at the hub of the [place] shuttle sheath. A gentle contrast injection demonstrated no evidence of spasms, dissections or intraluminal filling defects at the distal end of the guide catheter in the left common carotid artery. This was then connected to continuous heparinized saline infusion.  A 6 French 115 cm Navien guide catheter was then advanced just distal to the tip of the [place] shuttle sheath in the distal left common carotid artery. The guidewire was removed. Good aspiration was then obtained from the hub of the 6 [place]  Navien guide catheter. A gentle contrast injection revealed no evidence of spasms, dissections or of intraluminal filling defects.  Over a 0.035 inch Roadrunner guidewire, using biplane roadmap technique, the 6 [place] Navien guide catheter was then advanced to the distal cervical left ICA and positioned at the cervical petrous junction. The guidewire was removed. Good aspiration was obtained from the hub of the 6 [place] Navien guide catheter.  At this time in a coaxial manner and with constant heparinized saline infusion using biplane roadmap technique and constant fluoroscopic guidance, over a 0.014 inch Softip Synchro micro guidewire, a Headway 17 2 tip micro catheter which had been steam-shaped was then advanced to the distal end of the 6 [place] Navien guide catheter. The micro guidewire was then gently manipulated using a torque device into the left middle cerebral artery M1 segment followed by the micro catheter. The micro guidewire was then advanced and positioned just proximal to the level of the aneurysm neck at the origin of the inferior division of the left middle cerebral artery. Multiple attempts were then made to advance the micro guidewire with different distal configurations into the inferior divisions. All of these were unsuccessful.  The micro catheter was then advanced to the M2 M3 region of the superior division of the left middle cerebral artery. The guidewire was removed. Good aspiration was obtained from the hub of the Headway micro catheter. This was then connected to continuous heparinized saline infusion.  Through the second port of the Tuohy-Borst at the hub of the 6 [place] Navien guide catheter, an SL 10 2-tip micro catheter with a 45 degree angulation was then advanced over 0.0148 inch Softip Synchro micro guidewire to the distal end of the 6 [place] Navien guide catheter in the left internal carotid artery. With the micro guidewire leading with a J-tip configuration, the combination was  then advanced to the left middle cerebral artery M1 segment. The micro guidewire was then advanced into the aneurysm followed by the SL 10 45 degrees angle  micro catheter. The micro guidewire was advanced within the aneurysm under constant fluoroscopic guidance gingerly such that the tip of the wire was now pointing into the origin of the inferior division of the left middle cerebral artery. This was then followed by the micro catheter. The micro guidewire was then gently manipulated with access obtained into the inferior division. The wire was then advanced distally into the inferior division followed by the SL 10 micro catheter carefully. There was free advancement of the micro catheter to the M3 region of the left middle cerebral artery inferior division. The micro guidewire was then removed. Good aspiration was obtained from the hub of the SL 10 micro catheter. A gentle contrast injection demonstrated antegrade flow distally. This was then connected to continuous heparinized saline infusion.  At this time a 2.5 mm x 34 mm LVIS Jr stent with a working length of 30 mm was then advanced in a coaxial manner and with constant heparinized saline infusion using biplane roadmap technique and constant fluoroscopic guidance to the distal end of the SL 10 micro catheter. The O ring on the delivery micro guidewire and the delivery micro catheter were then gently loosened. With slight forward gentle traction with the right hand on the delivery micro guidewire, with the left hand the SL 10 micro catheter was gently retrieved in a controlled gradual slow fashion unsheathing the LVIS stent.  This was continued until the entire stent was deployed. A control arteriogram performed through the 6 [place] Navien guide catheter demonstrated excellent apposition in the left middle cerebral artery M1 segment and the M2 region of the inferior division of the left middle cerebral artery. However, there was significant narrowing noted in  the stent just distal to its entry into the inferior division.  This was then subsequently dilated with a 0.012 inch J-tipped Headliner micro guidewire which was advanced to the focal narrowing within the stent followed by the micro catheter. The micro guidewire was then gingerly advanced to and fro at the site of the superior stenosis. The wire was then retrieved proximally as was the micro catheter. A control arteriogram performed through the 6 [place] Navien guide catheter demonstrated significantly improved caliber of the stent at this point with excellent flow into the vessels.  Control arteriograms were then performed at 15, 30 and 40 minutes post stent deployment. These continued to demonstrate excellent flow. There was suspicion of a small filling defect along the distal aspect of the LVIS stent. This prompted the use of approximately 4 mg of superselective intracranial Integrelin over about 4 minutes.  Control arteriograms continued to demonstrate excellent flow with opening of the LVIS stent within the aneurysm itself.  A final control arteriogram performed through the 6 [place] Navien guide catheter continued to demonstrate excellent flow without any intraluminal filling defects.  This was then retrieved into the abdominal aorta along with the [place] shuttle sheath. This was then exchanged out for a 6 [place] Pinnacle sheath over a 0.035 inch guidewire. The sheath itself was then removed with the successful application of an external closure device.  The patient's ACT throughout the procedure was maintained in the region of 180 seconds.  No acute neurological or hemodynamic changes were seen throughout the procedure.  The patient's general anesthesia was then reversed and the patient was extubated. Upon recovery the patient demonstrated no new neurological signs or symptoms. Memory and orientation remained stable as did her motor, sensory and coordination function. She was then transferred to the  neuro ICU to continue her IV heparin and to control her blood pressure on IV Cardene within the parameters.  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   Electronically Signed   By: Luanne Bras M.D.   On: 01/21/2015 13:30   Ir Angio Intra Extracran Sel Com Carotid Innominate Uni R Mod Sed  01/22/2015   CLINICAL DATA:  Progressive headaches. History of multiple intracranial aneurysms. 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  EXAM: TRANSCATHETER THERAPY EMBOLIZATION OF LEFT MIDDLE CEREBRAL ARTERY REGION ANEURYSM  ANESTHESIA/SEDATION: General anesthesia.  MEDICATIONS: As per general anesthesia.  CONTRAST:  166mL OMNIPAQUE IOHEXOL 300 MG/ML  SOLN  PROCEDURE: Following a full explanation of the procedure along with the potential associated complications, an informed witnessed consent was obtained.  The right groin was prepped and draped in the usual sterile fashion. Thereafter using modified Seldinger technique, transfemoral access into right common femoral artery was obtained without difficulty. Over a 0.035 inch guidewire, a 5 French Pinnacle sheath was inserted. Through this, and also over a 0.035 inch guidewire, 5 French JB1 catheter was advanced to the aortic arch region and selectively positioned in the right common carotid artery, the right vertebral artery, and the left common carotid artery. The patient tolerated the procedure well. A 3D rotational arteriogram was also performed of the left intracranial circulation from a left-sided common carotid artery injection. Reconstructions were performed on a separate workstation.  COMPLICATIONS: None immediate.  FINDINGS: The right  common carotid arteriogram demonstrates the right external carotid artery and its major branches to be widely patent.  The right internal carotid artery at the bulb and its proximal one-third is patent.  There is a modest kink at the junction of the middle and the one-third of the right internal carotid artery without impedance of any blood flow distally.  More distally the vessel is seen to opacify normally to the cranial skull base.  The petrous, cavernous and supraclinoid segments are widely patent.  The right middle and the right anterior cerebral arteries are seen to opacify normally into the capillary and venous phases.  The right pericallosal artery is seen to cross the midline and supply the distal anterior cerebral artery distribution on the left.  Also seen are the previously positioned clips in the right middle cerebral artery distribution bifurcation. There is a mild fusiform prominence noted proximal to the clips at the inferior division of the right middle cerebral artery.  However, no angiographic change is noted from the previous catheter angiogram.  The vertebral artery origin is normal.  This is a dominant vertebral which it is seen to opacify to the cranial skull base. The previously endovascularly coiled right posterior-inferior cerebellar artery remains completely obliterated without evidence of recanalization or of coil compaction.  The right posterior-inferior cerebellar artery remains widely patent.  The distal basilar artery, the posterior cerebral arteries, the superior cerebellar arteries and the anterior inferior cerebellar arteries opacify normally into the capillary and venous phases.  The left common carotid arteriogram demonstrates the left external carotid artery and its major branches to be normal.  The left internal carotid artery at the bulb to the cranial skull base opacifies normally.  The petrous, the cavernous and the supraclinoid segments are widely patent. The left  middle  cerebral artery and the left anterior cerebral artery are seen to opacify normally into the capillary and venous phases.  Arising in the region of the superior and the inferior divisions of the left middle cerebral artery again seen is a saccular outpouching projecting inferiorly and slightly anteriorly.  A 3D rotational arteriogram performed with reconstruction on a separate workstation confirms the presence of a wide neck lobulated aneurysm measuring approximately 5 mm x 4.5 mm. This is a wide neck which extends mostly into the origin of the inferior division.  ENDOVASCULAR STAGED EMBOLIZATION OF WIDE NECK LOBULATED LEFT MCA BIFURCATION ANEURYSM USING THE LVIS JR STENT.  The angiographic findings were reviewed with the patient and the patient's family. Informed consent was obtained. The patient was then put under general anesthesia by the Department of Anesthesiology at [place] catheter in the left common carotid artery was then exchanged over a 0.035 inch 300 cm Constance Holster exchange guidewire for a 6 French 80 cm Cook shuttle sheath using biplane roadmap technique and constant fluoroscopic guidance. Good aspiration was obtained from the side port of the Tuohy-Borst at the hub of the [place] shuttle sheath. A gentle contrast injection demonstrated no evidence of spasms, dissections or intraluminal filling defects at the distal end of the guide catheter in the left common carotid artery. This was then connected to continuous heparinized saline infusion.  A 6 French 115 cm Navien guide catheter was then advanced just distal to the tip of the [place] shuttle sheath in the distal left common carotid artery. The guidewire was removed. Good aspiration was then obtained from the hub of the 6 [place] Navien guide catheter. A gentle contrast injection revealed no evidence of spasms, dissections or of intraluminal filling defects.  Over a 0.035 inch Roadrunner guidewire, using biplane roadmap  technique, the 6 [place] Navien guide catheter was then advanced to the distal cervical left ICA and positioned at the cervical petrous junction. The guidewire was removed. Good aspiration was obtained from the hub of the 6 [place] Navien guide catheter.  At this time in a coaxial manner and with constant heparinized saline infusion using biplane roadmap technique and constant fluoroscopic guidance, over a 0.014 inch Softip Synchro micro guidewire, a Headway 17 2 tip micro catheter which had been steam-shaped was then advanced to the distal end of the 6 [place] Navien guide catheter. The micro guidewire was then gently manipulated using a torque device into the left middle cerebral artery M1 segment followed by the micro catheter. The micro guidewire was then advanced and positioned just proximal to the level of the aneurysm neck at the origin of the inferior division of the left middle cerebral artery. Multiple attempts were then made to advance the micro guidewire with different distal configurations into the inferior divisions. All of these were unsuccessful.  The micro catheter was then advanced to the M2 M3 region of the superior division of the left middle cerebral artery. The guidewire was removed. Good aspiration was obtained from the hub of the Headway micro catheter. This was then connected to continuous heparinized saline infusion.  Through the second port of the Tuohy-Borst at the hub of the 6 [place] Navien guide catheter, an SL 10 2-tip micro catheter with a 45 degree angulation was then advanced over 0.0148 inch Softip Synchro micro guidewire to the distal end of the 6 [place] Navien guide catheter in the left internal carotid artery. With the micro guidewire leading with a J-tip  configuration, the combination was then advanced to the left middle cerebral artery M1 segment. The micro guidewire was then advanced into the aneurysm followed by the SL 10 45 degrees angle micro catheter. The micro guidewire was  advanced within the aneurysm under constant fluoroscopic guidance gingerly such that the tip of the wire was now pointing into the origin of the inferior division of the left middle cerebral artery. This was then followed by the micro catheter. The micro guidewire was then gently manipulated with access obtained into the inferior division. The wire was then advanced distally into the inferior division followed by the SL 10 micro catheter carefully. There was free advancement of the micro catheter to the M3 region of the left middle cerebral artery inferior division. The micro guidewire was then removed. Good aspiration was obtained from the hub of the SL 10 micro catheter. A gentle contrast injection demonstrated antegrade flow distally. This was then connected to continuous heparinized saline infusion.  At this time a 2.5 mm x 34 mm LVIS Jr stent with a working length of 30 mm was then advanced in a coaxial manner and with constant heparinized saline infusion using biplane roadmap technique and constant fluoroscopic guidance to the distal end of the SL 10 micro catheter. The O ring on the delivery micro guidewire and the delivery micro catheter were then gently loosened. With slight forward gentle traction with the right hand on the delivery micro guidewire, with the left hand the SL 10 micro catheter was gently retrieved in a controlled gradual slow fashion unsheathing the LVIS stent.  This was continued until the entire stent was deployed. A control arteriogram performed through the 6 [place] Navien guide catheter demonstrated excellent apposition in the left middle cerebral artery M1 segment and the M2 region of the inferior division of the left middle cerebral artery. However, there was significant narrowing noted in the stent just distal to its entry into the inferior division.  This was then subsequently dilated with a 0.012 inch J-tipped Headliner micro guidewire which was advanced to the focal narrowing  within the stent followed by the micro catheter. The micro guidewire was then gingerly advanced to and fro at the site of the superior stenosis. The wire was then retrieved proximally as was the micro catheter. A control arteriogram performed through the 6 [place] Navien guide catheter demonstrated significantly improved caliber of the stent at this point with excellent flow into the vessels.  Control arteriograms were then performed at 15, 30 and 40 minutes post stent deployment. These continued to demonstrate excellent flow. There was suspicion of a small filling defect along the distal aspect of the LVIS stent. This prompted the use of approximately 4 mg of superselective intracranial Integrelin over about 4 minutes.  Control arteriograms continued to demonstrate excellent flow with opening of the LVIS stent within the aneurysm itself.  A final control arteriogram performed through the 6 [place] Navien guide catheter continued to demonstrate excellent flow without any intraluminal filling defects.  This was then retrieved into the abdominal aorta along with the [place] shuttle sheath. This was then exchanged out for a 6 [place] Pinnacle sheath over a 0.035 inch guidewire. The sheath itself was then removed with the successful application of an external closure device.  The patient's ACT throughout the procedure was maintained in the region of 180 seconds.  No acute neurological or hemodynamic changes were seen throughout the procedure.  The patient's general anesthesia was then reversed and the patient was  extubated. Upon recovery the patient demonstrated no new neurological signs or symptoms. Memory and orientation remained stable as did her motor, sensory and coordination function. She was then transferred to the neuro ICU to continue her IV heparin and to control her blood pressure on IV Cardene within the parameters.  IMPRESSION: Endovascular staged embolization of wide neck lobulated complex aneurysm of  the left middle cerebral artery bifurcation using the LVIS Jr stent device as described above without event.   Electronically Signed   By: Luanne Bras M.D.   On: 01/21/2015 13:30   Ir Angio Intra Extracran Sel Internal Carotid Uni L Mod Sed  01/22/2015   CLINICAL DATA:  Progressive headaches. History of multiple intracranial aneurysms. 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  EXAM: TRANSCATHETER THERAPY EMBOLIZATION OF LEFT MIDDLE CEREBRAL ARTERY REGION ANEURYSM  ANESTHESIA/SEDATION: General anesthesia.  MEDICATIONS: As per general anesthesia.  CONTRAST:  119mL OMNIPAQUE IOHEXOL 300 MG/ML  SOLN  PROCEDURE: Following a full explanation of the procedure along with the potential associated complications, an informed witnessed consent was obtained.  The right groin was prepped and draped in the usual sterile fashion. Thereafter using modified Seldinger technique, transfemoral access into right common femoral artery was obtained without difficulty. Over a 0.035 inch guidewire, a 5 French Pinnacle sheath was inserted. Through this, and also over a 0.035 inch guidewire, 5 French JB1 catheter was advanced to the aortic arch region and selectively positioned in the right common carotid artery, the right vertebral artery, and the left common carotid artery. The patient tolerated the procedure well. A 3D rotational arteriogram was also performed of the left intracranial circulation from a left-sided common carotid artery injection. Reconstructions were performed on a separate workstation.  COMPLICATIONS: None immediate.  FINDINGS: The right common carotid arteriogram demonstrates the right external carotid artery and its major branches to be widely patent.  The right internal carotid artery at the bulb and its proximal one-third is patent.   There is a modest kink at the junction of the middle and the one-third of the right internal carotid artery without impedance of any blood flow distally.  More distally the vessel is seen to opacify normally to the cranial skull base.  The petrous, cavernous and supraclinoid segments are widely patent.  The right middle and the right anterior cerebral arteries are seen to opacify normally into the capillary and venous phases.  The right pericallosal artery is seen to cross the midline and supply the distal anterior cerebral artery distribution on the left.  Also seen are the previously positioned clips in the right middle cerebral artery distribution bifurcation. There is a mild fusiform prominence noted proximal to the clips at the inferior division of the right middle cerebral artery.  However, no angiographic change is noted from the previous catheter angiogram.  The vertebral artery origin is normal.  This is a dominant vertebral which it is seen to opacify to the cranial skull base. The previously endovascularly coiled right posterior-inferior cerebellar artery remains completely obliterated without evidence of recanalization or of coil compaction.  The right posterior-inferior cerebellar artery remains widely patent.  The distal basilar artery, the posterior cerebral arteries, the superior cerebellar arteries and the anterior inferior cerebellar arteries opacify normally into the capillary and venous phases.  The left common carotid arteriogram demonstrates the left external carotid artery and its major branches to be  normal.  The left internal carotid artery at the bulb to the cranial skull base opacifies normally.  The petrous, the cavernous and the supraclinoid segments are widely patent. The left middle cerebral artery and the left anterior cerebral artery are seen to opacify normally into the capillary and venous phases.  Arising in the region of the superior and the inferior divisions of the left middle  cerebral artery again seen is a saccular outpouching projecting inferiorly and slightly anteriorly.  A 3D rotational arteriogram performed with reconstruction on a separate workstation confirms the presence of a wide neck lobulated aneurysm measuring approximately 5 mm x 4.5 mm. This is a wide neck which extends mostly into the origin of the inferior division.  ENDOVASCULAR STAGED EMBOLIZATION OF WIDE NECK LOBULATED LEFT MCA BIFURCATION ANEURYSM USING THE LVIS JR STENT.  The angiographic findings were reviewed with the patient and the patient's family. Informed consent was obtained. The patient was then put under general anesthesia by the Department of Anesthesiology at [place] catheter in the left common carotid artery was then exchanged over a 0.035 inch 300 cm Constance Holster exchange guidewire for a 6 French 80 cm [place] shuttle sheath using biplane roadmap technique and constant fluoroscopic guidance. Good aspiration was obtained from the side port of the Tuohy-Borst at the hub of the [place] shuttle sheath. A gentle contrast injection demonstrated no evidence of spasms, dissections or intraluminal filling defects at the distal end of the guide catheter in the left common carotid artery. This was then connected to continuous heparinized saline infusion.  A 6 French 115 cm Navien guide catheter was then advanced just distal to the tip of the [place] shuttle sheath in the distal left common carotid artery. The guidewire was removed. Good aspiration was then obtained from the hub of the 6 [place] Navien guide catheter. A gentle contrast injection revealed no evidence of spasms, dissections or of intraluminal filling defects.  Over a 0.035 inch Roadrunner guidewire, using biplane roadmap technique, the 6 [place] Navien guide catheter was then advanced to the distal cervical left ICA and positioned at the cervical petrous junction. The guidewire was removed. Good aspiration was obtained from  the hub of the 6 [place] Navien guide catheter.  At this time in a coaxial manner and with constant heparinized saline infusion using biplane roadmap technique and constant fluoroscopic guidance, over a 0.014 inch Softip Synchro micro guidewire, a Headway 17 2 tip micro catheter which had been steam-shaped was then advanced to the distal end of the 6 [place] Navien guide catheter. The micro guidewire was then gently manipulated using a torque device into the left middle cerebral artery M1 segment followed by the micro catheter. The micro guidewire was then advanced and positioned just proximal to the level of the aneurysm neck at the origin of the inferior division of the left middle cerebral artery. Multiple attempts were then made to advance the micro guidewire with different distal configurations into the inferior divisions. All of these were unsuccessful.  The micro catheter was then advanced to the M2 M3 region of the superior division of the left middle cerebral artery. The guidewire was removed. Good aspiration was obtained from the hub of the Headway micro catheter. This was then connected to continuous heparinized saline infusion.  Through the second port of the Tuohy-Borst at the hub of the 6 [place] Navien guide catheter, an SL 10 2-tip micro catheter with a 45 degree angulation was then advanced over  0.0148 inch Softip Synchro micro guidewire to the distal end of the 6 [place] Navien guide catheter in the left internal carotid artery. With the micro guidewire leading with a J-tip configuration, the combination was then advanced to the left middle cerebral artery M1 segment. The micro guidewire was then advanced into the aneurysm followed by the SL 10 45 degrees angle micro catheter. The micro guidewire was advanced within the aneurysm under constant fluoroscopic guidance gingerly such that the tip of the wire was now pointing into the origin of the inferior division of the left middle cerebral artery. This  was then followed by the micro catheter. The micro guidewire was then gently manipulated with access obtained into the inferior division. The wire was then advanced distally into the inferior division followed by the SL 10 micro catheter carefully. There was free advancement of the micro catheter to the M3 region of the left middle cerebral artery inferior division. The micro guidewire was then removed. Good aspiration was obtained from the hub of the SL 10 micro catheter. A gentle contrast injection demonstrated antegrade flow distally. This was then connected to continuous heparinized saline infusion.  At this time a 2.5 mm x 34 mm LVIS Jr stent with a working length of 30 mm was then advanced in a coaxial manner and with constant heparinized saline infusion using biplane roadmap technique and constant fluoroscopic guidance to the distal end of the SL 10 micro catheter. The O ring on the delivery micro guidewire and the delivery micro catheter were then gently loosened. With slight forward gentle traction with the right hand on the delivery micro guidewire, with the left hand the SL 10 micro catheter was gently retrieved in a controlled gradual slow fashion unsheathing the LVIS stent.  This was continued until the entire stent was deployed. A control arteriogram performed through the 6 [place] Navien guide catheter demonstrated excellent apposition in the left middle cerebral artery M1 segment and the M2 region of the inferior division of the left middle cerebral artery. However, there was significant narrowing noted in the stent just distal to its entry into the inferior division.  This was then subsequently dilated with a 0.012 inch J-tipped Headliner micro guidewire which was advanced to the focal narrowing within the stent followed by the micro catheter. The micro guidewire was then gingerly advanced to and fro at the site of the superior stenosis. The wire was then retrieved proximally as was the micro  catheter. A control arteriogram performed through the 6 [place] Navien guide catheter demonstrated significantly improved caliber of the stent at this point with excellent flow into the vessels.  Control arteriograms were then performed at 15, 30 and 40 minutes post stent deployment. These continued to demonstrate excellent flow. There was suspicion of a small filling defect along the distal aspect of the LVIS stent. This prompted the use of approximately 4 mg of superselective intracranial Integrelin over about 4 minutes.  Control arteriograms continued to demonstrate excellent flow with opening of the LVIS stent within the aneurysm itself.  A final control arteriogram performed through the 6 [place] Navien guide catheter continued to demonstrate excellent flow without any intraluminal filling defects.  This was then retrieved into the abdominal aorta along with the [place] shuttle sheath. This was then exchanged out for a 6 [place] Pinnacle sheath over a 0.035 inch guidewire. The sheath itself was then removed with the successful application of an external closure device.  The patient's ACT throughout the procedure was  maintained in the region of 180 seconds.  No acute neurological or hemodynamic changes were seen throughout the procedure.  The patient's general anesthesia was then reversed and the patient was extubated. Upon recovery the patient demonstrated no new neurological signs or symptoms. Memory and orientation remained stable as did her motor, sensory and coordination function. She was then transferred to the neuro ICU to continue her IV heparin and to control her blood pressure on IV Cardene within the parameters.  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   Electronically Signed   By: Luanne Bras M.D.   On: 01/21/2015 13:30   Ir Angio  Vertebral Sel Vertebral Uni R Mod Sed  01/22/2015   CLINICAL DATA:  Progressive headaches. History of multiple intracranial aneurysms. 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  EXAM: TRANSCATHETER THERAPY EMBOLIZATION OF LEFT MIDDLE CEREBRAL ARTERY REGION ANEURYSM  ANESTHESIA/SEDATION: General anesthesia.  MEDICATIONS: As per general anesthesia.  CONTRAST:  113mL OMNIPAQUE IOHEXOL 300 MG/ML  SOLN  PROCEDURE: Following a full explanation of the procedure along with the potential associated complications, an informed witnessed consent was obtained.  The right groin was prepped and draped in the usual sterile fashion. Thereafter using modified Seldinger technique, transfemoral access into right common femoral artery was obtained without difficulty. Over a 0.035 inch guidewire, a 5 French Pinnacle sheath was inserted. Through this, and also over a 0.035 inch guidewire, 5 French JB1 catheter was advanced to the aortic arch region and selectively positioned in the right common carotid artery, the right vertebral artery, and the left common carotid artery. The patient tolerated the procedure well. A 3D rotational arteriogram was also performed of the left intracranial circulation from a left-sided common carotid artery injection. Reconstructions were performed on a separate workstation.  COMPLICATIONS: None immediate.  FINDINGS: The right common carotid arteriogram demonstrates the right external carotid artery and its major branches to be widely patent.  The right internal carotid artery at the bulb and its proximal one-third is patent.  There is a modest kink at the junction of the middle and the one-third of the right internal carotid artery without impedance of any blood flow distally.  More distally the vessel is seen to opacify normally to  the cranial skull base.  The petrous, cavernous and supraclinoid segments are widely patent.  The right middle and the right anterior cerebral arteries are seen to opacify normally into the capillary and venous phases.  The right pericallosal artery is seen to cross the midline and supply the distal anterior cerebral artery distribution on the left.  Also seen are the previously positioned clips in the right middle cerebral artery distribution bifurcation. There is a mild fusiform prominence noted proximal to the clips at the inferior division of the right middle cerebral artery.  However, no angiographic change is noted from the previous catheter angiogram.  The vertebral artery origin is normal.  This is a dominant vertebral which it is seen to opacify to the cranial skull base. The previously endovascularly coiled right posterior-inferior cerebellar artery remains completely obliterated without evidence of recanalization or of coil compaction.  The right posterior-inferior cerebellar artery remains widely patent.  The distal basilar artery, the posterior cerebral arteries, the superior cerebellar arteries and the anterior  inferior cerebellar arteries opacify normally into the capillary and venous phases.  The left common carotid arteriogram demonstrates the left external carotid artery and its major branches to be normal.  The left internal carotid artery at the bulb to the cranial skull base opacifies normally.  The petrous, the cavernous and the supraclinoid segments are widely patent. The left middle cerebral artery and the left anterior cerebral artery are seen to opacify normally into the capillary and venous phases.  Arising in the region of the superior and the inferior divisions of the left middle cerebral artery again seen is a saccular outpouching projecting inferiorly and slightly anteriorly.  A 3D rotational arteriogram performed with reconstruction on a separate workstation confirms the presence of a  wide neck lobulated aneurysm measuring approximately 5 mm x 4.5 mm. This is a wide neck which extends mostly into the origin of the inferior division.  ENDOVASCULAR STAGED EMBOLIZATION OF WIDE NECK LOBULATED LEFT MCA BIFURCATION ANEURYSM USING THE LVIS JR STENT.  The angiographic findings were reviewed with the patient and the patient's family. Informed consent was obtained. The patient was then put under general anesthesia by the Department of Anesthesiology at [place] catheter in the left common carotid artery was then exchanged over a 0.035 inch 300 cm Constance Holster exchange guidewire for a 6 French 80 cm Cook shuttle sheath using biplane roadmap technique and constant fluoroscopic guidance. Good aspiration was obtained from the side port of the Tuohy-Borst at the hub of the [place] shuttle sheath. A gentle contrast injection demonstrated no evidence of spasms, dissections or intraluminal filling defects at the distal end of the guide catheter in the left common carotid artery. This was then connected to continuous heparinized saline infusion.  A 6 French 115 cm Navien guide catheter was then advanced just distal to the tip of the [place] shuttle sheath in the distal left common carotid artery. The guidewire was removed. Good aspiration was then obtained from the hub of the 6 [place] Navien guide catheter. A gentle contrast injection revealed no evidence of spasms, dissections or of intraluminal filling defects.  Over a 0.035 inch Roadrunner guidewire, using biplane roadmap technique, the 6 [place] Navien guide catheter was then advanced to the distal cervical left ICA and positioned at the cervical petrous junction. The guidewire was removed. Good aspiration was obtained from the hub of the 6 [place] Navien guide catheter.  At this time in a coaxial manner and with constant heparinized saline infusion using biplane roadmap technique and constant fluoroscopic guidance, over a 0.014 inch  Softip Synchro micro guidewire, a Headway 17 2 tip micro catheter which had been steam-shaped was then advanced to the distal end of the 6 [place] Navien guide catheter. The micro guidewire was then gently manipulated using a torque device into the left middle cerebral artery M1 segment followed by the micro catheter. The micro guidewire was then advanced and positioned just proximal to the level of the aneurysm neck at the origin of the inferior division of the left middle cerebral artery. Multiple attempts were then made to advance the micro guidewire with different distal configurations into the inferior divisions. All of these were unsuccessful.  The micro catheter was then advanced to the M2 M3 region of the superior division of the left middle cerebral artery. The guidewire was removed. Good aspiration was obtained from the hub of the Headway micro catheter. This was then connected to continuous heparinized saline infusion.  Through the second  port of the Tuohy-Borst at the hub of the 6 [place] Navien guide catheter, an SL 10 2-tip micro catheter with a 45 degree angulation was then advanced over 0.0148 inch Softip Synchro micro guidewire to the distal end of the 6 [place] Navien guide catheter in the left internal carotid artery. With the micro guidewire leading with a J-tip configuration, the combination was then advanced to the left middle cerebral artery M1 segment. The micro guidewire was then advanced into the aneurysm followed by the SL 10 45 degrees angle micro catheter. The micro guidewire was advanced within the aneurysm under constant fluoroscopic guidance gingerly such that the tip of the wire was now pointing into the origin of the inferior division of the left middle cerebral artery. This was then followed by the micro catheter. The micro guidewire was then gently manipulated with access obtained into the inferior division. The wire was then advanced distally into the inferior division followed by  the SL 10 micro catheter carefully. There was free advancement of the micro catheter to the M3 region of the left middle cerebral artery inferior division. The micro guidewire was then removed. Good aspiration was obtained from the hub of the SL 10 micro catheter. A gentle contrast injection demonstrated antegrade flow distally. This was then connected to continuous heparinized saline infusion.  At this time a 2.5 mm x 34 mm LVIS Jr stent with a working length of 30 mm was then advanced in a coaxial manner and with constant heparinized saline infusion using biplane roadmap technique and constant fluoroscopic guidance to the distal end of the SL 10 micro catheter. The O ring on the delivery micro guidewire and the delivery micro catheter were then gently loosened. With slight forward gentle traction with the right hand on the delivery micro guidewire, with the left hand the SL 10 micro catheter was gently retrieved in a controlled gradual slow fashion unsheathing the LVIS stent.  This was continued until the entire stent was deployed. A control arteriogram performed through the 6 [place] Navien guide catheter demonstrated excellent apposition in the left middle cerebral artery M1 segment and the M2 region of the inferior division of the left middle cerebral artery. However, there was significant narrowing noted in the stent just distal to its entry into the inferior division.  This was then subsequently dilated with a 0.012 inch J-tipped Headliner micro guidewire which was advanced to the focal narrowing within the stent followed by the micro catheter. The micro guidewire was then gingerly advanced to and fro at the site of the superior stenosis. The wire was then retrieved proximally as was the micro catheter. A control arteriogram performed through the 6 [place] Navien guide catheter demonstrated significantly improved caliber of the stent at this point with excellent flow into the vessels.  Control arteriograms were  then performed at 15, 30 and 40 minutes post stent deployment. These continued to demonstrate excellent flow. There was suspicion of a small filling defect along the distal aspect of the LVIS stent. This prompted the use of approximately 4 mg of superselective intracranial Integrelin over about 4 minutes.  Control arteriograms continued to demonstrate excellent flow with opening of the LVIS stent within the aneurysm itself.  A final control arteriogram performed through the 6 [place] Navien guide catheter continued to demonstrate excellent flow without any intraluminal filling defects.  This was then retrieved into the abdominal aorta along with the [place] shuttle sheath. This was then exchanged out for a [place]  sheath over a 0.035 inch guidewire. The sheath itself was then removed with the successful application of an external closure device.  The patient's ACT throughout the procedure was maintained in the region of 180 seconds.  No acute neurological or hemodynamic changes were seen throughout the procedure.  The patient's general anesthesia was then reversed and the patient was extubated. Upon recovery the patient demonstrated no new neurological signs or symptoms. Memory and orientation remained stable as did her motor, sensory and coordination function. She was then transferred to the neuro ICU to continue her IV heparin and to control her blood pressure on IV Cardene within the parameters.  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   Electronically Signed   By: Luanne Bras M.D.   On: 01/21/2015 13:30   Ir Neuro Each Add'l After Basic Uni Left (ms)  01/22/2015   CLINICAL DATA:  Progressive headaches. History of multiple intracranial aneurysms. 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  EXAM: TRANSCATHETER THERAPY EMBOLIZATION OF LEFT MIDDLE CEREBRAL ARTERY REGION ANEURYSM  ANESTHESIA/SEDATION: General anesthesia.  MEDICATIONS: As per general anesthesia.  CONTRAST:  164mL OMNIPAQUE IOHEXOL 300 MG/ML  SOLN  PROCEDURE: Following a full explanation of the procedure along with the potential associated complications, an informed witnessed consent was obtained.  The right groin was prepped and draped in the usual sterile fashion. Thereafter using modified Seldinger technique, transfemoral access into right common femoral artery was obtained without difficulty. Over a 0.035 inch guidewire, a 5 French Pinnacle sheath was inserted. Through this, and also over a 0.035 inch guidewire, 5 French JB1 catheter was advanced to the aortic arch region and selectively positioned in the right common carotid artery, the right vertebral artery, and the left common carotid artery. The patient tolerated the procedure well. A 3D rotational arteriogram was also performed of the left intracranial circulation from a left-sided common carotid artery injection. Reconstructions were performed on a separate workstation.  COMPLICATIONS: None immediate.  FINDINGS: The right common carotid arteriogram demonstrates the right external carotid artery and its major branches to be widely patent.  The right internal carotid artery at the bulb and its proximal one-third is patent.  There is a modest kink at the junction of the middle and the one-third of the right internal carotid artery without impedance of any blood flow distally.  More distally the vessel is seen to opacify normally to the cranial skull base.  The petrous, cavernous and supraclinoid segments are widely patent.  The right middle and the right anterior cerebral arteries are seen to opacify normally into the capillary and venous  phases.  The right pericallosal artery is seen to cross the midline and supply the distal anterior cerebral artery distribution on the left.  Also seen are the previously positioned clips in the right middle cerebral artery distribution bifurcation. There is a mild fusiform prominence noted proximal to the clips at the inferior division of the right middle cerebral artery.  However, no angiographic change is noted from the previous catheter angiogram.  The vertebral artery origin is normal.  This is a dominant vertebral which it is seen to opacify to the cranial skull base. The previously endovascularly coiled right posterior-inferior cerebellar artery remains completely obliterated without evidence of recanalization  or of coil compaction.  The right posterior-inferior cerebellar artery remains widely patent.  The distal basilar artery, the posterior cerebral arteries, the superior cerebellar arteries and the anterior inferior cerebellar arteries opacify normally into the capillary and venous phases.  The left common carotid arteriogram demonstrates the left external carotid artery and its major branches to be normal.  The left internal carotid artery at the bulb to the cranial skull base opacifies normally.  The petrous, the cavernous and the supraclinoid segments are widely patent. The left middle cerebral artery and the left anterior cerebral artery are seen to opacify normally into the capillary and venous phases.  Arising in the region of the superior and the inferior divisions of the left middle cerebral artery again seen is a saccular outpouching projecting inferiorly and slightly anteriorly.  A 3D rotational arteriogram performed with reconstruction on a separate workstation confirms the presence of a wide neck lobulated aneurysm measuring approximately 5 mm x 4.5 mm. This is a wide neck which extends mostly into the origin of the inferior division.  ENDOVASCULAR STAGED EMBOLIZATION OF WIDE NECK LOBULATED  LEFT MCA BIFURCATION ANEURYSM USING THE LVIS JR STENT.  The angiographic findings were reviewed with the patient and the patient's family. Informed consent was obtained. The patient was then put under general anesthesia by the Department of Anesthesiology at [place] catheter in the left common carotid artery was then exchanged over a 0.035 inch 300 cm Constance Holster exchange guidewire for a 6 French 80 cm [place] shuttle sheath using biplane roadmap technique and constant fluoroscopic guidance. Good aspiration was obtained from the side port of the Tuohy-Borst at the hub of the [place] shuttle sheath. A gentle contrast injection demonstrated no evidence of spasms, dissections or intraluminal filling defects at the distal end of the guide catheter in the left common carotid artery. This was then connected to continuous heparinized saline infusion.  A 6 French 115 cm Navien guide catheter was then advanced just distal to the tip of the [place] shuttle sheath in the distal left common carotid artery. The guidewire was removed. Good aspiration was then obtained from the hub of the 6 [place] Navien guide catheter. A gentle contrast injection revealed no evidence of spasms, dissections or of intraluminal filling defects.  Over a 0.035 inch Roadrunner guidewire, using biplane roadmap technique, the 6 [place] Navien guide catheter was then advanced to the distal cervical left ICA and positioned at the cervical petrous junction. The guidewire was removed. Good aspiration was obtained from the hub of the 6 [place] Navien guide catheter.  At this time in a coaxial manner and with constant heparinized saline infusion using biplane roadmap technique and constant fluoroscopic guidance, over a 0.014 inch Softip Synchro micro guidewire, a Headway 17 2 tip micro catheter which had been steam-shaped was then advanced to the distal end of the 6 [place] Navien guide catheter. The micro guidewire was then gently  manipulated using a torque device into the left middle cerebral artery M1 segment followed by the micro catheter. The micro guidewire was then advanced and positioned just proximal to the level of the aneurysm neck at the origin of the inferior division of the left middle cerebral artery. Multiple attempts were then made to advance the micro guidewire with different distal configurations into the inferior divisions. All of these were unsuccessful.  The micro catheter was then advanced to the M2 M3 region of the superior division of the left middle cerebral artery.  The guidewire was removed. Good aspiration was obtained from the hub of the Headway micro catheter. This was then connected to continuous heparinized saline infusion.  Through the second port of the Tuohy-Borst at the hub of the 6 [place] Navien guide catheter, an SL 10 2-tip micro catheter with a 45 degree angulation was then advanced over 0.0148 inch Softip Synchro micro guidewire to the distal end of the 6 [place] Navien guide catheter in the left internal carotid artery. With the micro guidewire leading with a J-tip configuration, the combination was then advanced to the left middle cerebral artery M1 segment. The micro guidewire was then advanced into the aneurysm followed by the SL 10 45 degrees angle micro catheter. The micro guidewire was advanced within the aneurysm under constant fluoroscopic guidance gingerly such that the tip of the wire was now pointing into the origin of the inferior division of the left middle cerebral artery. This was then followed by the micro catheter. The micro guidewire was then gently manipulated with access obtained into the inferior division. The wire was then advanced distally into the inferior division followed by the SL 10 micro catheter carefully. There was free advancement of the micro catheter to the M3 region of the left middle cerebral artery inferior division. The micro guidewire was then removed. Good  aspiration was obtained from the hub of the SL 10 micro catheter. A gentle contrast injection demonstrated antegrade flow distally. This was then connected to continuous heparinized saline infusion.  At this time a 2.5 mm x 34 mm LVIS Jr stent with a working length of 30 mm was then advanced in a coaxial manner and with constant heparinized saline infusion using biplane roadmap technique and constant fluoroscopic guidance to the distal end of the SL 10 micro catheter. The O ring on the delivery micro guidewire and the delivery micro catheter were then gently loosened. With slight forward gentle traction with the right hand on the delivery micro guidewire, with the left hand the SL 10 micro catheter was gently retrieved in a controlled gradual slow fashion unsheathing the LVIS stent.  This was continued until the entire stent was deployed. A control arteriogram performed through the 6 [place] Navien guide catheter demonstrated excellent apposition in the left middle cerebral artery M1 segment and the M2 region of the inferior division of the left middle cerebral artery. However, there was significant narrowing noted in the stent just distal to its entry into the inferior division.  This was then subsequently dilated with a 0.012 inch J-tipped Headliner micro guidewire which was advanced to the focal narrowing within the stent followed by the micro catheter. The micro guidewire was then gingerly advanced to and fro at the site of the superior stenosis. The wire was then retrieved proximally as was the micro catheter. A control arteriogram performed through the 6 [place] Navien guide catheter demonstrated significantly improved caliber of the stent at this point with excellent flow into the vessels.  Control arteriograms were then performed at 15, 30 and 40 minutes post stent deployment. These continued to demonstrate excellent flow. There was suspicion of a small filling defect along the distal aspect of the LVIS stent.  This prompted the use of approximately 4 mg of superselective intracranial Integrelin over about 4 minutes.  Control arteriograms continued to demonstrate excellent flow with opening of the LVIS stent within the aneurysm itself.  A final control arteriogram performed through the 6 [place] Navien guide catheter continued to demonstrate excellent flow without any intraluminal  filling defects.  This was then retrieved into the abdominal aorta along with the [place] shuttle sheath. This was then exchanged out for a 6 [place] Pinnacle sheath over a 0.035 inch guidewire. The sheath itself was then removed with the successful application of an external closure device.  The patient's ACT throughout the procedure was maintained in the region of 180 seconds.  No acute neurological or hemodynamic changes were seen throughout the procedure.  The patient's general anesthesia was then reversed and the patient was extubated. Upon recovery the patient demonstrated no new neurological signs or symptoms. Memory and orientation remained stable as did her motor, sensory and coordination function. She was then transferred to the neuro ICU to continue her IV heparin and to control her blood pressure on IV Cardene within the parameters.  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   Electronically Signed   By: Luanne Bras M.D.   On: 01/21/2015 13:30       Medical Problem List and Plan: 1. 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  DVT Prophylaxis/Anticoagulation: SQ Lovenox.monitor for any bleeding episodes 3. Pain Management: tylenol as needed 4. Mood/depression/anxiety: Risperdal 0.5 mg QHS/Desyrel 150 mg QHS,Zoloft 100 mg daily,Xanax 1 mg TID as needed. Team to provide ego-support as well. 5.  Neuropsych: This patient is capable of making decisions on her own behalf. 6. Skin/Wound Care: Routine skin checks 7. Fluids/Electrolytes/Nutrition: Strict I & O.Follow up labs 8.Hyperlipidemia.Lipitor/Lovaza     Post Admission Physician Evaluation: 1. Functional deficits secondary  to right brain infarct after elective left MCA embolization. 2. Patient is admitted to receive collaborative, interdisciplinary care between the physiatrist, rehab nursing staff, and therapy team. 3. Patient's level of medical complexity and substantial therapy needs in context of that medical necessity cannot be provided at a lesser intensity of care such as a SNF. 4. Patient has experienced substantial functional loss from his/her baseline which was documented above under the "Functional History" and "Functional Status" headings.  Judging by the patient's diagnosis, physical exam, and functional history, the patient has potential for functional progress which will result in measurable gains while on inpatient rehab.  These gains will be of substantial and practical use upon discharge  in facilitating mobility and self-care at the household level. 5. Physiatrist will provide 24 hour management of medical needs as well as oversight of the therapy plan/treatment and provide guidance as appropriate regarding the interaction of the two. 6. 24 hour rehab nursing will assist with bladder management, bowel management, safety, skin/wound care, disease management, medication administration, pain management and patient education  and help integrate therapy concepts, techniques,education, etc. 7. PT will assess and treat for/with: Lower extremity strength, range of motion, stamina, balance, functional mobility, safety, adaptive techniques and equipment, NMR, visual perceptual awareness, proprioception, stroke education, family education.   Goals are: mod I. 8. OT will assess and treat for/with: ADL's, functional mobility, safety,  upper extremity strength, adaptive techniques and equipment, NMR, visual perceptual awareness, stroke education, ego support, .   Goals are: mod I to supervision. Therapy may proceed with showering this patient.   9. SLP will assess and treat for/with: speech, swallowing, communication.  Goals are: mod I to supervision. 10. Case Management and Social Worker will assess and treat for psychological issues and discharge planning. 11. Team conference will be  held weekly to assess progress toward goals and to determine barriers to discharge. 12. Patient will receive at least 3 hours of therapy per day at least 5 days per week. 13. ELOS: 16-20  days       14. Prognosis:  excellent     Meredith Staggers, MD, [facility] 01/22/2015   01/22/2015

## 2015-01-22 NOTE — Progress Notes (Signed)
Physical Medicine and Rehabilitation Consult Reason for Consult:left sided weakness Referring Physician: Dr. Estanislado Pandy   HPI: Diane Hutchinson is a 69 y.o. right handed female with history of hypertension, migraine headaches as well as multiple intracranial aneurysms with right MCA aneurysmal clipping 1993 as well as coiling 2010 without residual weakness.. Independent/driving prior to admission living with her husband. Presented 01/20/2015 with decreased balance as well as headache. She had been seen by interventional radiology in the past with plan for image guided cerebral arteriogram for findings a left MCA aneurysm. Underwent left MCA elective embolization using stent assistance 01/20/2015 per Intervention radiology. Patient was extubated noted left-sided tingling and weakness. Code stroke was called. CT of the head showed no acute stroke. CTA was then performed again showing no large vessel occlusion. She was not a TPA candidate. Neurology consulted suspect non-dominant right brain infarct, embolic secondary to complication of interventional neuroradiology aneurysm embolization. Echocardiogram is pending. She currently is maintained on aspirin and Plavix therapy. Subcutaneous Lovenox added for DVT prophylaxis. Bouts of restlessness and agitation her Risperdal and Desyrel as prior to admission was resumed. Physical therapy evaluation completed 01/21/2015 with recommendations of physical medicine rehabilitation consult.   Review of Systems  Neurological: Positive for focal weakness and headaches.  Psychiatric/Behavioral: Positive for depression.  All other systems reviewed and are negative.  Past Medical History  Diagnosis Date  . Migraine   . Depression   . Hypertension   . Stroke   . COPD (chronic obstructive pulmonary disease)   . Anxiety     h/o of panic attack  . GERD (gastroesophageal reflux disease)     occas. use of TUMS  . Arthritis      knees   Past Surgical History  Procedure Laterality Date  . [place] surgery    . Cholecystectomy    . Appendectomy    . Eye surgery      laser to both eyes for blocked tear ducts   . Brain surgery  1993    aneurysm  . Tubal ligation    . Hemorroidectomy     Family History  Problem Relation Age of Onset  . Stroke Neg Hx    Social History:  reports that she has been smoking. She has never used smokeless tobacco. She reports that she does not drink alcohol or use illicit drugs. Allergies:  Allergies  Allergen Reactions  . Codeine Other (See Comments)    dellusions  . Meperidine Hcl Other (See Comments)    Hurts stomach  . Morphine Other (See Comments)    dellusions  . Sulfonamide Derivatives Other (See Comments)    Drives her nuts   Medications Prior to Admission  Medication Sig Dispense Refill  . alendronate (FOSAMAX) 70 MG tablet Take 70 mg by mouth every Monday. Take with a full glass of water on an empty stomach.    . ALPRAZolam (XANAX) 1 MG tablet Take 1 mg by mouth 3 (three) times daily as needed for anxiety.    Marland Kitchen aspirin 81 MG tablet Take 324 mg by mouth once.    Marland Kitchen atorvastatin (LIPITOR) 40 MG tablet Take 40 mg by mouth daily.    . Cholecalciferol (VITAMIN D PO) Take 1 tablet by mouth daily.    . clopidogrel (PLAVIX) 75 MG tablet Take 75 mg by mouth daily.    Marland Kitchen gemfibrozil (LOPID) 600 MG tablet Take 600 mg by mouth 2 (two) times daily before a meal.    . meloxicam (MOBIC) 15 MG tablet Take  15 mg by mouth daily.    . metoprolol succinate (TOPROL XL) 25 MG 24 hr tablet Take 25 mg by mouth at bedtime.     . Omega-3 Fatty Acids (FISH OIL PO) Take 1 capsule by mouth daily.    . risperiDONE (RISPERDAL) 0.5 MG tablet Take 0.5 mg by mouth at bedtime.    . sertraline (ZOLOFT) 100 MG tablet Take 200 mg by mouth at bedtime.   4  . traZODone  (DESYREL) 150 MG tablet Take 300 mg by mouth at bedtime.    Marland Kitchen VITAMIN E PO Take 1 tablet by mouth daily.    Marland Kitchen albuterol (PROVENTIL) (2.5 MG/3ML) 0.083% nebulizer solution Take 2.5 mg by nebulization every 6 (six) hours as needed for wheezing or shortness of breath.    Marland Kitchen ipratropium (ATROVENT) 0.02 % nebulizer solution Take 0.5 mg by nebulization every 6 (six) hours as needed for wheezing or shortness of breath.       Home: Home Living Family/patient expects to be discharged to:: Private residence Living Arrangements: Spouse/significant other  Functional History:   Functional Status:  Mobility:   Bed Mobility Overal bed mobility: Needs Assistance Bed Mobility: Supine to Sit   Supine to sit: Mod assist   General bed mobility comments: cues for step-by-step sequencing, attending to L side, and encouragement. pt tends to neglect L UE > LE.   Transfers Overall transfer level: Needs assistance Equipment used: 2 person hand held assist Transfers: Sit to/from [facility] Sit to Stand: Mod assist Stand pivot transfers: Max assist;+2 physical assistance    General transfer comment: pt able to come to stand with one person A and use of R UE on armrest of recliner. pt with por awareness of L side and poor proprioception as pt's L foot tends to supinate. cues and facilitation for trunk/hip extension into standing and to complete pivot        ADL:    Cognition: Cognition Orientation Level: Oriented X4    Blood pressure 128/108, pulse 62, temperature 98.7 F (37.1 C), temperature source Oral, resp. rate 17, height 5\' 7"  (1.702 m), weight 80.74 kg (178 lb), SpO2 99 %. Physical Exam  Vitals reviewed. Constitutional: She is oriented to person, place, and time. She appears well-developed and well-nourished.  HENT:  Head: Normocephalic and atraumatic.  Right Ear: External ear normal.  Eyes: Conjunctivae and EOM are normal.  Pupils are equal, round, and reactive to light. Right eye exhibits no discharge. Left eye exhibits no discharge.  Neck: Normal range of motion. Neck supple. No JVD present. No tracheal deviation present. No thyromegaly present.  Cardiovascular: Normal rate, regular rhythm and normal heart sounds. Exam reveals no friction rub.  No murmur heard. Respiratory: Effort normal and breath sounds normal. No respiratory distress. She has no wheezes. She has no rales.  GI: Soft. Bowel sounds are normal. She exhibits no distension. There is no tenderness. There is no rebound.  Musculoskeletal: She exhibits no edema or tenderness.  Neurological: She is alert and oriented to person, place, and time.  Follows commands. Fair awareness of deficits. LUE: delt 3+, bicep and tricep 3, wrist and HI 3-. LLE: 1+ to 2- HF, KE and 2/5 ADF/APF. RUE and RLE 5/5. Decreased sensation to LT and PP on left face,tongue, arm, leg. Mild left central 7. Speech slightly slurred but very intelligible. DTR's 1+. Toes up on left.  Skin: Skin is warm and dry. No rash noted. No erythema.     Lab Results Last  24 Hours    Results for orders placed or performed during the hospital encounter of 01/20/15 (from the past 24 hour(s))  MRSA PCR Screening Status: None   Collection Time: 01/20/15 4:00 PM  Result Value Ref Range   MRSA by PCR NEGATIVE NEGATIVE  Heparin level (unfractionated) Status: Abnormal   Collection Time: 01/20/15 11:00 PM  Result Value Ref Range   Heparin Unfractionated 0.14 (L) 0.30 - 0.70 IU/mL  Basic metabolic panel Status: Abnormal   Collection Time: 01/21/15 5:05 AM  Result Value Ref Range   Sodium 141 135 - 145 mmol/L   Potassium 3.8 3.5 - 5.1 mmol/L   Chloride 108 96 - 112 mmol/L   CO2 29 19 - 32 mmol/L   Glucose, Bld 105 (H) 70 - 99 mg/dL   BUN 7 6 - 23 mg/dL   Creatinine, Ser 0.69 0.50 - 1.10 mg/dL   Calcium 7.9 (L) 8.4 - 10.5  mg/dL   GFR calc non Af Amer 87 (L) >90 mL/min   GFR calc Af Amer >90 >90 mL/min   Anion gap 4 (L) 5 - 15  CBC WITH DIFFERENTIAL Status: Abnormal   Collection Time: 01/21/15 5:05 AM  Result Value Ref Range   WBC 12.1 (H) 4.0 - 10.5 K/uL   RBC 3.63 (L) 3.87 - 5.11 MIL/uL   Hemoglobin 11.9 (L) 12.0 - 15.0 g/dL   HCT 34.8 (L) 36.0 - 46.0 %   MCV 95.9 78.0 - 100.0 fL   MCH 32.8 26.0 - 34.0 pg   MCHC 34.2 30.0 - 36.0 g/dL   RDW 12.8 11.5 - 15.5 %   Platelets 187 150 - 400 K/uL   Neutrophils Relative % 71 43 - 77 %   Neutro Abs 8.6 (H) 1.7 - 7.7 K/uL   Lymphocytes Relative 22 12 - 46 %   Lymphs Abs 2.6 0.7 - 4.0 K/uL   Monocytes Relative 7 3 - 12 %   Monocytes Absolute 0.9 0.1 - 1.0 K/uL   Eosinophils Relative 0 0 - 5 %   Eosinophils Absolute 0.0 0.0 - 0.7 K/uL   Basophils Relative 0 0 - 1 %   Basophils Absolute 0.0 0.0 - 0.1 K/uL  Platelet inhibition p2y12 Status: None   Collection Time: 01/21/15 7:55 AM  Result Value Ref Range   Platelet Function P2Y12 212 194 - 418 PRU  Urinalysis, Routine w reflex microscopic Status: Abnormal   Collection Time: 01/21/15 8:20 AM  Result Value Ref Range   Color, Urine YELLOW YELLOW   APPearance CLEAR CLEAR   Specific Gravity, Urine 1.007 1.005 - 1.030   pH 6.5 5.0 - 8.0   Glucose, UA NEGATIVE NEGATIVE mg/dL   Hgb urine dipstick TRACE (A) NEGATIVE   Bilirubin Urine NEGATIVE NEGATIVE   Ketones, ur NEGATIVE NEGATIVE mg/dL   Protein, ur NEGATIVE NEGATIVE mg/dL   Urobilinogen, UA 0.2 0.0 - 1.0 mg/dL   Nitrite NEGATIVE NEGATIVE   Leukocytes, UA NEGATIVE NEGATIVE  Urine microscopic-add on Status: None   Collection Time: 01/21/15 8:20 AM  Result Value Ref Range   Squamous Epithelial / LPF RARE RARE   WBC, UA 0-2 <3 WBC/hpf   RBC / HPF 0-2 <3 RBC/hpf     Bacteria, UA RARE RARE  CBC Status: Abnormal   Collection Time: 01/21/15 11:45 AM  Result Value Ref Range   WBC 12.3 (H) 4.0 - 10.5 K/uL   RBC 3.85 (L) 3.87 - 5.11 MIL/uL   Hemoglobin 12.7 12.0 - 15.0 g/dL  HCT 37.3 36.0 - 46.0 %   MCV 96.9 78.0 - 100.0 fL   MCH 33.0 26.0 - 34.0 pg   MCHC 34.0 30.0 - 36.0 g/dL   RDW 12.9 11.5 - 15.5 %   Platelets 189 150 - 400 K/uL      Imaging Results (Last 48 hours)    Ct Angio Head W/cm &/or Wo Cm  01/20/2015 CLINICAL DATA: Pipeline stent placed for left MCA aneurysm. Postoperative complaints of left arm and leg weakness. EXAM: CT ANGIOGRAPHY HEAD AND NECK TECHNIQUE: Multidetector CT imaging of the head and neck was performed using the standard protocol during bolus administration of intravenous contrast. Multiplanar CT image reconstructions and MIPs were obtained to evaluate the vascular anatomy. Carotid stenosis measurements (when applicable) are obtained utilizing NASCET criteria, using the distal internal carotid diameter as the denominator. CONTRAST: 39mL OMNIPAQUE IOHEXOL 350 MG/ML SOLN COMPARISON: Angiography same day. CTA 12/03/2014. FINDINGS: CT HEAD Brain: Previous core allowing upper right vertebral aneurysm. Previous clipping of the right MCA aneurysm. Placement of pipeline stent in the left MCA region today. No evidence of intracranial hemorrhage. 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 No sign of acute infarction by CT. Calvarium and skull base: Postoperative changes. Nothing a acute. Paranasal sinuses: Clear CTA NECK Aortic arch: There is advanced atherosclerotic disease of the arch with calcified plaque in either soft plaque or thrombus along the inferior margin of the arch. 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 Branching pattern of brachiocephalic vessels from the arch is normal without origin stenosis.  Right carotid system: Right common carotid artery widely patent to the bifurcation. Ordinary mild atherosclerotic disease at the carotid bifurcation without stenosis or irregularity. Cervical internal carotid artery widely patent. Left carotid system: Left common carotid artery affected by diffuse atherosclerotic disease. Narrowing in the proximal portion of 40%. Vessel patent to the bifurcation with mild atherosclerotic change at the bifurcation but no stenosis or irregularity. Vertebral arteries:Right vertebral artery dominant. Both vertebral artery origins are widely patent. Both vessels are patent through the cervical region to the foramen magnum. Skeleton: Ordinary cervical spondylosis Other neck: No significant soft tissue lesion. CTA HEAD Anterior circulation: Both carotid siphon regions widely patent. On the right, the anterior and middle cerebral vessels are patent. There is artifact related to previous MCA aneurysm clipping. I do not see any missing vessels on the right compared to the previous study of 12/03/2014. On the left, pipeline stent is in place beginning at the M1 segment and extending into 1 of the left MCA branches. This spans the location of the fusiform aneurysm of the MCA branch, which measures 2.6 x 5.4 mm. No missing branch vessels are seen on the left. One could question if there is mild spasm of the MCA branch of medially beyond the and of the stent. Beyond that area, the vessel appears normal as it did before. Posterior circulation: The right vertebral artery is a large vessel widely patent to the basilar. 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 The left vertebral artery is a small vessel that largely terminates in PICA. There is not definitely any flow beyond PICA to the basilar. The basilar artery shows mild atherosclerotic irregularity but no flow-limiting stenosis. Superior cerebellar and posterior cerebral vessels are patent and  normal. Venous sinuses: Patent and normal. Anatomic variants: No insignificant Delayed phase: No significant finding IMPRESSION: Pipeline stent placed in the left MCA spanning a left  MCA aneurysm. No complications seen relative to that. No missing vessels. No hemorrhage. One could question mild spasm of the MCA branch just beyond the end of the stent, but the vessel is widely patent beyond that and appears as it did before. Previously clipped right MCA region aneurysm. No missing vessels demonstrated on the right compared to the study of 12/03/2014. 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 Electronically Signed By: Nelson Chimes M.D. On: 01/20/2015 17:50   Ct Head Wo Contrast  01/20/2015 CLINICAL DATA: Pipeline stent placed for left MCA aneurysm. Postoperative complaints of left arm and leg weakness. EXAM: CT ANGIOGRAPHY HEAD AND NECK TECHNIQUE: Multidetector CT imaging of the head and neck was performed using the standard protocol during bolus administration of intravenous contrast. Multiplanar CT image reconstructions and MIPs were obtained to evaluate the vascular anatomy. Carotid stenosis measurements (when applicable) are obtained utilizing NASCET criteria, using the distal internal carotid diameter as the denominator. CONTRAST: 60mL OMNIPAQUE IOHEXOL 350 MG/ML SOLN COMPARISON: Angiography same day. CTA 12/03/2014. FINDINGS: CT HEAD Brain: Previous core allowing upper right vertebral aneurysm. Previous clipping of the right MCA aneurysm. Placement of pipeline stent in the left MCA region today. No evidence of intracranial hemorrhage. 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 No sign of acute infarction by CT. Calvarium and skull base: Postoperative changes. Nothing a acute. Paranasal sinuses: Clear CTA NECK Aortic arch: There is advanced atherosclerotic disease of the arch with calcified plaque in either soft plaque or thrombus  along the inferior margin of the arch. 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 Branching pattern of brachiocephalic vessels from the arch is normal without origin stenosis. Right carotid system: Right common carotid artery widely patent to the bifurcation. Ordinary mild atherosclerotic disease at the carotid bifurcation without stenosis or irregularity. Cervical internal carotid artery widely patent. Left carotid system: Left common carotid artery affected by diffuse atherosclerotic disease. Narrowing in the proximal portion of 40%. Vessel patent to the bifurcation with mild atherosclerotic change at the bifurcation but no stenosis or irregularity. Vertebral arteries:Right vertebral artery dominant. Both vertebral artery origins are widely patent. Both vessels are patent through the cervical region to the foramen magnum. Skeleton: Ordinary cervical spondylosis Other neck: No significant soft tissue lesion. CTA HEAD Anterior circulation: Both carotid siphon regions widely patent. On the right, the anterior and middle cerebral vessels are patent. There is artifact related to previous MCA aneurysm clipping. I do not see any missing vessels on the right compared to the previous study of 12/03/2014. On the left, pipeline stent is in place beginning at the M1 segment and extending into 1 of the left MCA branches. This spans the location of the fusiform aneurysm of the MCA branch, which measures 2.6 x 5.4 mm. No missing branch vessels are seen on the left. One could question if there is mild spasm of the MCA branch of medially beyond the and of the stent. Beyond that area, the vessel appears normal as it did before. Posterior circulation: The right vertebral artery is a large vessel widely patent to the basilar. 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 The left vertebral artery is a small vessel that largely terminates in  PICA. There is not definitely any flow beyond PICA to the basilar. The basilar artery shows mild atherosclerotic irregularity but no flow-limiting stenosis. Superior cerebellar and posterior cerebral vessels are patent and normal. Venous sinuses: Patent and  normal. Anatomic variants: No insignificant Delayed phase: No significant finding IMPRESSION: Pipeline stent placed in the left MCA spanning a left MCA aneurysm. No complications seen relative to that. No missing vessels. No hemorrhage. One could question mild spasm of the MCA branch just beyond the end of the stent, but the vessel is widely patent beyond that and appears as it did before. Previously clipped right MCA region aneurysm. No missing vessels demonstrated on the right compared to the study of 12/03/2014. 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 Electronically Signed By: Nelson Chimes M.D. On: 01/20/2015 17:50   Ct Angio Neck W/cm &/or Wo/cm  01/20/2015 CLINICAL DATA: Pipeline stent placed for left MCA aneurysm. Postoperative complaints of left arm and leg weakness. EXAM: CT ANGIOGRAPHY HEAD AND NECK TECHNIQUE: Multidetector CT imaging of the head and neck was performed using the standard protocol during bolus administration of intravenous contrast. Multiplanar CT image reconstructions and MIPs were obtained to evaluate the vascular anatomy. Carotid stenosis measurements (when applicable) are obtained utilizing NASCET criteria, using the distal internal carotid diameter as the denominator. CONTRAST: 25mL OMNIPAQUE IOHEXOL 350 MG/ML SOLN COMPARISON: Angiography same day. CTA 12/03/2014. FINDINGS: CT HEAD Brain: Previous core allowing upper right vertebral aneurysm. Previous clipping of the right MCA aneurysm. Placement of pipeline stent in the left MCA region today. No evidence of intracranial hemorrhage. 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 No sign of acute infarction by  CT. Calvarium and skull base: Postoperative changes. Nothing a acute. Paranasal sinuses: Clear CTA NECK Aortic arch: There is advanced atherosclerotic disease of the arch with calcified plaque in either soft plaque or thrombus along the inferior margin of the arch. 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 Branching pattern of brachiocephalic vessels from the arch is normal without origin stenosis. Right carotid system: Right common carotid artery widely patent to the bifurcation. Ordinary mild atherosclerotic disease at the carotid bifurcation without stenosis or irregularity. Cervical internal carotid artery widely patent. Left carotid system: Left common carotid artery affected by diffuse atherosclerotic disease. Narrowing in the proximal portion of 40%. Vessel patent to the bifurcation with mild atherosclerotic change at the bifurcation but no stenosis or irregularity. Vertebral arteries:Right vertebral artery dominant. Both vertebral artery origins are widely patent. Both vessels are patent through the cervical region to the foramen magnum. Skeleton: Ordinary cervical spondylosis Other neck: No significant soft tissue lesion. CTA HEAD Anterior circulation: Both carotid siphon regions widely patent. On the right, the anterior and middle cerebral vessels are patent. There is artifact related to previous MCA aneurysm clipping. I do not see any missing vessels on the right compared to the previous study of 12/03/2014. On the left, pipeline stent is in place beginning at the M1 segment and extending into 1 of the left MCA branches. This spans the location of the fusiform aneurysm of the MCA branch, which measures 2.6 x 5.4 mm. No missing branch vessels are seen on the left. One could question if there is mild spasm of the MCA branch of medially beyond the and of the stent. Beyond that area, the vessel appears normal as it did before. Posterior  circulation: The right vertebral artery is a large vessel widely patent to the basilar. 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 The left vertebral artery is a small vessel that largely terminates in PICA. There is not definitely any flow beyond PICA to the basilar. The basilar  artery shows mild atherosclerotic irregularity but no flow-limiting stenosis. Superior cerebellar and posterior cerebral vessels are patent and normal. Venous sinuses: Patent and normal. Anatomic variants: No insignificant Delayed phase: No significant finding IMPRESSION: Pipeline stent placed in the left MCA spanning a left MCA aneurysm. No complications seen relative to that. No missing vessels. No hemorrhage. One could question mild spasm of the MCA branch just beyond the end of the stent, but the vessel is widely patent beyond that and appears as it did before. Previously clipped right MCA region aneurysm. No missing vessels demonstrated on the right compared to the study of 12/03/2014. 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 Electronically Signed By: Nelson Chimes M.D. On: 01/20/2015 17:50     Assessment/Plan: Diagnosis: embolic right brain infarct after embolization procedure 1. Does the need for close, 24 hr/day medical supervision in concert with the patient's rehab needs make it unreasonable for this patient to be served in a less intensive setting? Yes 2. Co-Morbidities requiring supervision/potential complications: Depression COPD,  3. Due to bladder management, bowel management, safety, skin/wound care, disease management, medication administration, pain management and patient education, does the patient require 24 hr/day rehab nursing? Yes 4. Does the patient require coordinated care of a physician, rehab nurse, PT (1-2 hrs/day, 5 days/week), OT (1-2 hrs/day, 5 days/week) and SLP (1-2 hrs/day, 5 days/week) to address physical and  functional deficits in the context of the above medical diagnosis(es)? Yes Addressing deficits in the following areas: balance, endurance, locomotion, strength, transferring, bowel/bladder control, bathing, dressing, feeding, grooming, toileting, speech and swallowing 5. Can the patient actively participate in an intensive therapy program of at least 3 hrs of therapy per day at least 5 days per week? Yes 6. The potential for patient to make measurable gains while on inpatient rehab is excellent 7. Anticipated functional outcomes upon discharge from inpatient rehab are modified independent with PT, modified independent with OT, modified independent with SLP. 8. Estimated rehab length of stay to reach the above functional goals is: 14-18 days 9. Does the patient have adequate social supports and living environment to accommodate these discharge functional goals? Yes 10. Anticipated D/C setting: Home 11. Anticipated post D/C treatments: [place] therapy 12. Overall Rehab/Functional Prognosis: excellent  RECOMMENDATIONS: This patient's condition is appropriate for continued rehabilitative care in the following setting: CIR Patient has agreed to participate in recommended program. Yes Note that insurance prior authorization may be required for reimbursement for recommended care.  Comment: Rehab Admissions Coordinator to follow up.  Thanks,  Meredith Staggers, MD, [facility]     01/21/2015       Revision History     Date/Time User Provider Type Action   01/22/2015 10:51 AM Meredith Staggers, MD Physician Sign   01/22/2015 10:30 AM Cathlyn Parsons, PA-C Physician Assistant Pend   View Details Report       Routing History     Date/Time From To Method   01/22/2015 10:51 AM Meredith Staggers, MD Meredith Staggers, MD In Basket

## 2015-01-22 NOTE — Progress Notes (Signed)
Cristina Gong, RN Rehab Admission Coordinator Signed Physical Medicine and Rehabilitation PMR Pre-admission 01/22/2015 12:11 PM  Related encounter: Admission (Current) from 01/20/2015 in [place] [facility]    Expand All Collapse All   PMR Admission Coordinator Pre-Admission Assessment  Patient: Diane Hutchinson is an 69 y.o., female MRN: [id number] DOB: 11-Jul-1946 Height: 5\' 7"  (170.2 cm) Weight: 80.74 kg (178 lb)  Insurance Information HMO: PPO: PCP: IPA: 80/20: yes OTHER: no HMO PRIMARY: Medicare a and b Policy#: [id number] Subscriber: pt Benefits: Phone #: palmetto online Name: 01/22/2015 Eff. 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 right handed female with history of hypertension, migraine headaches as well as multiple intracranial aneurysms with right MCA aneurysmal clipping 1993 as well as coiling 2010 without residual weakness.. Independent/driving prior to admission living with her husband.  Presented  01/20/2015 with decreased balance as well as headache. She had been seen by interventional radiology in the past with plan for image guided cerebral arteriogram for findings a left MCA aneurysm. Underwent left MCA elective embolization using stent assistance 01/20/2015 per Intervention radiology. Patient was extubated noted left-sided tingling and weakness. Code stroke was called. CT of the head showed no acute stroke. CTA was then performed again showing no large vessel occlusion. She was not a TPA candidate. Neurology consulted suspect non-dominant right brain infarct, embolic secondary to complication of interventional neuroradiology aneurysm embolization. Echocardiogram is pending. She currently is maintained on aspirin and Plavix therapy. Subcutaneous Lovenox added for DVT prophylaxis. Bouts of restlessness and agitation her Risperdal and Desyrel as prior to admission was resumed.  Total: 8 NIH    Past Medical History  Past Medical History  Diagnosis Date  . Migraine   . Depression   . Hypertension   . Stroke   . COPD (chronic obstructive pulmonary disease)   . Anxiety     h/o of panic attack  . GERD (gastroesophageal reflux disease)     occas. use of TUMS  . Arthritis     knees    Family History  family history is negative for Stroke.  Prior Rehab/Hospitalizations: none  Current Medications   Current facility-administered medications:  . 0.9 % sodium chloride infusion, , Intravenous, Continuous, Luanne Bras, MD, Last Rate: 75 mL/hr at 01/22/15 1100 . 0.9 % sodium chloride infusion, , Intravenous, Continuous, Amie Portland, MD . acetaminophen (TYLENOL) tablet 1,000 mg, 1,000 mg, Oral, Q6H PRN, 1,000 mg at 01/21/15 0335 **OR** acetaminophen (TYLENOL) suppository 650 mg, 650 mg, Rectal, Q6H PRN, Luanne Bras, MD . ALPRAZolam Duanne Moron) tablet 1 mg, 1 mg, Oral, TID PRN, Wallie Char, 1 mg at 01/22/15 0944 . aspirin tablet  325 mg, 325 mg, Oral, Q breakfast, Luanne Bras, MD, 325 mg at 01/22/15 0832 . atorvastatin (LIPITOR) tablet 40 mg, 40 mg, Oral, q1800, Donzetta Starch, NP, 40 mg at 01/21/15  1806 . clopidogrel (PLAVIX) tablet 75 mg, 75 mg, Oral, Q breakfast, Luanne Bras, MD, 75 mg at 01/22/15 0832 . enoxaparin (LOVENOX) injection 40 mg, 40 mg, Subcutaneous, Q24H, Garvin Fila, MD, 40 mg at 01/22/15 0944 . nicardipine (CARDENE) 20mg  in 0.86% saline 223ml IV infusion (0.1 mg/ml), 5-15 mg/hr, Intravenous, Continuous, Sanjeev Deveshwar, MD . omega-3 acid ethyl esters (LOVAZA) capsule 1 g, 1 g, Oral, Daily, Donzetta Starch, NP, 1 g at 01/22/15 0944 . ondansetron (ZOFRAN) injection 4 mg, 4 mg, Intravenous, Q6H PRN, Luanne Bras, MD . phenol (CHLORASEPTIC) mouth spray 1 spray, 1 spray, Mouth/Throat, PRN, Luanne Bras, MD . risperiDONE (RISPERDAL) tablet 0.5 mg, 0.5 mg, Oral, QHS, Wallie Char, 0.5 mg at 01/21/15 2351 . sertraline (ZOLOFT) tablet 100 mg, 100 mg, Oral, QHS, Wallie Char, 100 mg at 01/21/15 2328 . traZODone (DESYREL) tablet 150 mg, 150 mg, Oral, QHS, Wallie Char, 150 mg at 01/21/15 2351  Patients Current Diet: Diet regular with thin liquids  Precautions / Restrictions Precautions Precautions: Fall Restrictions Weight Bearing Restrictions: No   Prior Activity Level Community (5-7x/wk): Independent and driving pta with no deficits   Development worker, international aid / Equipment Home Assistive Devices/Equipment: None  Prior Functional Level Prior Function Level of Independence: Independent  Current Functional Level Cognition  Overall Cognitive Status: Within Functional Limits for tasks assessed Orientation Level: Oriented X4   Extremity Assessment (includes Sensation/Coordination)  Upper Extremity Assessment: Defer to OT evaluation  Lower Extremity Assessment: LLE deficits/detail LLE Deficits / Details: Strength grossly 4-/5, decreased coordination, ataxic,  and decresed sensation to soft touch and proprioception. Of note pt is hypersensitive to painful stimuli.  LLE Sensation: decreased light touch, decreased proprioception LLE Coordination: decreased fine motor    ADLs       Mobility  Overal bed mobility: Needs Assistance Bed Mobility: Supine to Sit Supine to sit: Mod assist General bed mobility comments: cues for step-by-step sequencing, attending to L side, and encouragement. pt tends to neglect L UE > LE.     Transfers  Overall transfer level: Needs assistance Equipment used: 2 person hand held assist Transfers: Sit to/from Stand, Stand Pivot Transfers Sit to Stand: Mod assist Stand pivot transfers: Max assist, +2 physical assistance General transfer comment: pt able to come to stand with one person A and use of R UE on armrest of recliner. pt with por awareness of L side and poor proprioception as pt's L foot tends to supinate. cues and facilitation for trunk/hip extension into standing and to complete pivot.     Ambulation / Gait / Stairs / Office manager / Balance Dynamic Sitting Balance Sitting balance - Comments: pt able to maintain balance without UE support when focused on task of sitting, however when distracted pt needs single UE support or MinA to maintain sitting balance.  Balance Overall balance assessment: Needs assistance Sitting-balance support: Single extremity supported, Feet supported Sitting balance-Leahy Scale: Fair Sitting balance - Comments: pt able to maintain balance without UE support when focused on task of sitting, however when distracted pt needs single UE support or MinA to maintain sitting balance.  Standing balance support: During functional activity Standing balance-Leahy Scale: Poor    Special needs/care consideration Bowel mgmt:continent Bladder mgmt: continent    Previous Home Environment Living Arrangements: Spouse/significant other Lives  With: Spouse Available Help at Discharge: Family, Available 24 hours/day Type of Home: Mobile home Home Layout: One level Home Access: Ramped entrance, Other (comment) (ramp in the  back that pt's father used before he died) Bathroom Shower/Tub: Multimedia programmer: Standard Bathroom Accessibility: Yes How Accessible: Accessible via walker Home Care Services: No  Discharge Living Setting Plans for Discharge Living Setting: Mobile Home, Lives with (comment) (spouse) Type of Home at Discharge: Mobile home Discharge Home Layout: One level Discharge Home Access: Ramped entrance, Other (comment) (ramp in back that her dad used before he died) Discharge Bathroom Shower/Tub: Walk-in shower Discharge Bathroom Toilet: Standard Discharge Bathroom Accessibility: Yes How Accessible: Accessible via walker Does the patient have any problems obtaining your medications?: No  Social/Family/Support Systems Patient Roles: Spouse, Parent Contact Information: [name], spouse Anticipated Caregiver: spouse Anticipated Ambulance person Information: see above Ability/Limitations of Caregiver: spouse can take FMLA Caregiver Availability: 24/7 Discharge Plan Discussed with Primary Caregiver: Yes Is Caregiver In Agreement with Plan?: Yes Does Caregiver/Family have Issues with Lodging/Transportation while Pt is in Rehab?: No  Spouse can take fmla for up to one year due to his saved time. He typically works 7 a until 3 pm.   Goals/Additional Needs Patient/Family Goal for Rehab: Mod I with PT, OT, and SLP Expected length of stay: ELOS 14-18 days Pt/Family Agrees to Admission and willing to participate: Yes Program Orientation Provided & Reviewed with Pt/Caregiver Including Roles & Responsibilities: Yes   Decrease burden of Care through IP rehab admission: n/a  Possible need for SNF placement upon discharge:not anticipated  Patient Condition: This patient's condition remains as documented  in the consult dated 01/22/2015, in which the Rehabilitation Physician determined and documented that the patient's condition is appropriate for intensive rehabilitative care in an inpatient rehabilitation facility. Will admit to inpatient rehab today.  Preadmission Screen Completed By: Cleatrice Burke, 01/22/2015 12:19 PM ______________________________________________________________________  Discussed status with Dr. Naaman Plummer on 01/22/2015 at 1217 and received telephone approval for admission today.  Admission Coordinator: Cleatrice Burke, time 3212 Date 01/22/2015.          Cosigned by: Meredith Staggers, MD at 01/22/2015 12:22 PM  Revision History     Date/Time User Provider Type Action   01/22/2015 12:22 PM Meredith Staggers, MD Physician Cosign   01/22/2015 12:19 PM Cristina Gong, RN Rehab Admission Coordinator Sign

## 2015-01-22 NOTE — Plan of Care (Signed)
Problem: Progression Outcomes Goal: Rehab Team goals identified Outcome: Completed/Met Date Met:  01/22/15 Pt to be admitted to inpatient rehab 3/11

## 2015-01-22 NOTE — Progress Notes (Signed)
OT Cancellation Note  Patient Details Name: Diane Hutchinson MRN: [id number] DOB: September 17, 1946   Cancelled Treatment:    Reason Eval/Treat Not Completed: Other (comment)--pt transferring to rehab today.  Almon Register [phone] 01/22/2015, 12:23 PM

## 2015-01-22 NOTE — H&P (View-Only) (Signed)
Physical Medicine and Rehabilitation Admission H&P    Chief Complaint:Headache  HPI: Diane Hutchinson is a 69 y.o. right handed female with history of hypertension, migraine headaches as well as multiple intracranial aneurysms with right MCA aneurysmal clipping 1993 as well as coiling 2010 without residual weakness.. Independent/driving prior to admission living with her husband. Presented 01/20/2015 with decreased balance as well as headache. She had been seen by interventional radiology in the past with plan for image guided cerebral arteriogram for findings a left MCA aneurysm. Underwent left MCA elective embolization using stent assistance 01/20/2015 per Intervention radiology. Patient was extubated noted left-sided tingling and weakness. Code stroke was called. CT of the head showed no acute stroke. CTA was then performed again showing no large vessel occlusion. She was not a TPA candidate. Neurology consulted suspect non-dominant right brain infarct, embolic secondary to complication of interventional neuroradiology aneurysm embolization. Echocardiogram is pending. She currently is maintained on aspirin and Plavix therapy. Subcutaneous Lovenox added for DVT prophylaxis. Bouts of restlessness and agitation her Risperdal and Desyrel as prior to admission was resumed. Physical therapy evaluation completed 01/21/2015 with recommendations of physical medicine rehabilitation consult.Patient was admitted for a comprehensive rehab program  ROS Review of Systems  Neurological: Positive for focal weakness and headaches.  Psychiatric/Behavioral: Positive for depression.  All other systems reviewed and are negative   Past Medical History  Diagnosis Date  . Migraine   . Depression   . Hypertension   . Stroke   . COPD (chronic obstructive pulmonary disease)   . Anxiety     h/o of panic attack  . GERD (gastroesophageal reflux disease)     occas. use of TUMS  . Arthritis     knees   Past  Surgical History  Procedure Laterality Date  . [place] surgery    . Cholecystectomy    . Appendectomy    . Eye surgery      laser to both eyes for blocked tear ducts   . Brain surgery  1993    aneurysm  . Tubal ligation    . Hemorroidectomy    . Radiology with anesthesia N/A 01/20/2015    Procedure: RADIOLOGY WITH ANESTHESIA;  Surgeon: Luanne Bras, MD;  Location: [facility];  Service: Radiology;  Laterality: N/A;   Family History  Problem Relation Age of Onset  . Stroke Neg Hx    Social History:  reports that she has been smoking.  She has never used smokeless tobacco. She reports that she does not drink alcohol or use illicit drugs. Allergies:  Allergies  Allergen Reactions  . Codeine Other (See Comments)    dellusions  . Meperidine Hcl Other (See Comments)    Hurts stomach  . Morphine Other (See Comments)    dellusions  . Sulfonamide Derivatives Other (See Comments)    Drives her nuts   Medications Prior to Admission  Medication Sig Dispense Refill  . alendronate (FOSAMAX) 70 MG tablet Take 70 mg by mouth every Monday. Take with a full glass of water on an empty stomach.    . ALPRAZolam (XANAX) 1 MG tablet Take 1 mg by mouth 3 (three) times daily as needed for anxiety.    Marland Kitchen aspirin 81 MG tablet Take 324 mg by mouth once.    Marland Kitchen atorvastatin (LIPITOR) 40 MG tablet Take 40 mg by mouth daily.    . Cholecalciferol (VITAMIN D PO) Take 1 tablet by mouth daily.    . clopidogrel (PLAVIX) 75 MG tablet Take  75 mg by mouth daily.    Marland Kitchen gemfibrozil (LOPID) 600 MG tablet Take 600 mg by mouth 2 (two) times daily before a meal.    . meloxicam (MOBIC) 15 MG tablet Take 15 mg by mouth daily.    . metoprolol succinate (TOPROL XL) 25 MG 24 hr tablet Take 25 mg by mouth at bedtime.     . Omega-3 Fatty Acids (FISH OIL PO) Take 1 capsule by mouth daily.    . risperiDONE (RISPERDAL) 0.5 MG tablet Take 0.5 mg by mouth at bedtime.    . sertraline (ZOLOFT) 100 MG tablet Take 200 mg by mouth at bedtime.    4  . traZODone (DESYREL) 150 MG tablet Take 300 mg by mouth at bedtime.    Marland Kitchen VITAMIN E PO Take 1 tablet by mouth daily.    Marland Kitchen albuterol (PROVENTIL) (2.5 MG/3ML) 0.083% nebulizer solution Take 2.5 mg by nebulization every 6 (six) hours as needed for wheezing or shortness of breath.    Marland Kitchen ipratropium (ATROVENT) 0.02 % nebulizer solution Take 0.5 mg by nebulization every 6 (six) hours as needed for wheezing or shortness of breath.       Home: Home Living Family/patient expects to be discharged to:: Inpatient rehab Living Arrangements: Spouse/significant other   Functional History: Prior Function Level of Independence: Independent  Functional Status:  Mobility: Bed Mobility Overal bed mobility: Needs Assistance Bed Mobility: Supine to Sit Supine to sit: Mod assist General bed mobility comments: cues for step-by-step sequencing, attending to L side, and encouragement.  pt tends to neglect L UE > LE.   Transfers Overall transfer level: Needs assistance Equipment used: 2 person hand held assist Transfers: Sit to/from Stand, Stand Pivot Transfers Sit to Stand: Mod assist Stand pivot transfers: Max assist, +2 physical assistance General transfer comment: pt able to come to stand with one person A and use of R UE on armrest of recliner.  pt with por awareness of L side and poor proprioception as pt's L foot tends to supinate.  cues and facilitation for trunk/hip extension into standing and to complete pivot.        ADL:    Cognition: Cognition Overall Cognitive Status: Within Functional Limits for tasks assessed Orientation Level: Oriented X4 Cognition Arousal/Alertness: Awake/alert Behavior During Therapy: WFL for tasks assessed/performed Overall Cognitive Status: Within Functional Limits for tasks assessed  Physical Exam: Blood pressure 135/71, pulse 70, temperature 98.9 F (37.2 C), temperature source Oral, resp. rate 18, Hutchinson $RemoveBe'5\' 7"'[id number]$  (1.702 m), weight 80.74 kg (178 lb), SpO2  99 %. Physical Exam Constitutional: She is oriented to person, place, and time. She appears well-developed and well-nourished.  HENT:  Head: Normocephalic and atraumatic.  Right Ear: External ear normal.  Eyes: Conjunctivae and EOM are normal. Pupils are equal, round, and reactive to light. Right eye exhibits no discharge. Left eye exhibits no discharge.  Neck: Normal range of motion. Neck supple. No JVD present. No tracheal deviation present. No thyromegaly present.  Cardiovascular: Normal rate, regular rhythm and normal heart sounds. Exam reveals no friction rub.  No murmur heard. Respiratory: Effort normal and breath sounds normal. No respiratory distress. She has no wheezes. She has no rales.  GI: Soft. Bowel sounds are normal. She exhibits no distension. There is no tenderness. There is no rebound.  Musculoskeletal: She exhibits no edema or tenderness.  Neurological: She is alert and oriented to person, place, and time.  Follows commands. Fair awareness of deficits. LUE: delt 3+, bicep and tricep 3,  wrist and HI 3-. LLE: 1+ to 2- HF, KE and 2/5 ADF/APF. RUE and RLE 5/5. Decreased sensation to LT and PP on left face,tongue, arm, leg. Mild left central 7. Speech slightly slurred but very intelligible. DTR's 1+. Toes up on left.  Skin: Skin is warm and dry. No rash noted. No erythema   Results for orders placed or performed during the hospital encounter of 01/20/15 (from the past 48 hour(s))  MRSA PCR Screening     Status: None   Collection Time: 01/20/15  4:00 PM  Result Value Ref Range   MRSA by PCR NEGATIVE NEGATIVE    Comment:        The GeneXpert MRSA Assay (FDA approved for NASAL specimens only), is one component of a comprehensive MRSA colonization surveillance program. It is not intended to diagnose MRSA infection nor to guide or monitor treatment for MRSA infections.   Heparin level (unfractionated)     Status: Abnormal   Collection Time: 01/20/15 11:00 PM  Result  Value Ref Range   Heparin Unfractionated 0.14 (L) 0.30 - 0.70 IU/mL    Comment:        IF HEPARIN RESULTS ARE BELOW EXPECTED VALUES, AND PATIENT DOSAGE HAS BEEN CONFIRMED, SUGGEST FOLLOW UP TESTING OF ANTITHROMBIN III LEVELS.   Basic metabolic panel     Status: Abnormal   Collection Time: 01/21/15  5:05 AM  Result Value Ref Range   Sodium 141 135 - 145 mmol/L   Potassium 3.8 3.5 - 5.1 mmol/L   Chloride 108 96 - 112 mmol/L   CO2 29 19 - 32 mmol/L   Glucose, Bld 105 (H) 70 - 99 mg/dL   BUN 7 6 - 23 mg/dL   Creatinine, Ser 0.69 0.50 - 1.10 mg/dL   Calcium 7.9 (L) 8.4 - 10.5 mg/dL   GFR calc non Af Amer 87 (L) >90 mL/min   GFR calc Af Amer >90 >90 mL/min    Comment: (NOTE) The eGFR has been calculated using the CKD EPI equation. This calculation has not been validated in all clinical situations. eGFR's persistently <90 mL/min signify possible Chronic Kidney Disease.    Anion gap 4 (L) 5 - 15  CBC WITH DIFFERENTIAL     Status: Abnormal   Collection Time: 01/21/15  5:05 AM  Result Value Ref Range   WBC 12.1 (H) 4.0 - 10.5 K/uL   RBC 3.63 (L) 3.87 - 5.11 MIL/uL   Hemoglobin 11.9 (L) 12.0 - 15.0 g/dL   HCT 34.8 (L) 36.0 - 46.0 %   MCV 95.9 78.0 - 100.0 fL   MCH 32.8 26.0 - 34.0 pg   MCHC 34.2 30.0 - 36.0 g/dL   RDW 12.8 11.5 - 15.5 %   Platelets 187 150 - 400 K/uL   Neutrophils Relative % 71 43 - 77 %   Neutro Abs 8.6 (H) 1.7 - 7.7 K/uL   Lymphocytes Relative 22 12 - 46 %   Lymphs Abs 2.6 0.7 - 4.0 K/uL   Monocytes Relative 7 3 - 12 %   Monocytes Absolute 0.9 0.1 - 1.0 K/uL   Eosinophils Relative 0 0 - 5 %   Eosinophils Absolute 0.0 0.0 - 0.7 K/uL   Basophils Relative 0 0 - 1 %   Basophils Absolute 0.0 0.0 - 0.1 K/uL  Platelet inhibition p2y12     Status: None   Collection Time: 01/21/15  7:55 AM  Result Value Ref Range   Platelet Function  P2Y12 212 194 - 418 PRU  Comment:        The literature has shown a direct correlation of PRU values over 230 with higher  risks of thrombotic events.  Lower PRU values are associated with platelet inhibition.   Hemoglobin A1c     Status: Abnormal   Collection Time: 01/21/15  8:20 AM  Result Value Ref Range   Hgb A1c MFr Bld 6.0 (H) 4.8 - 5.6 %    Comment: (NOTE)         Pre-diabetes: 5.7 - 6.4         Diabetes: >6.4         Glycemic control for adults with diabetes: <7.0    Mean Plasma Glucose 126 mg/dL    Comment: (NOTE) Performed At: [facility] [place], [place] [id number] Lindon Romp MD WU:[id number]   Urinalysis, Routine w reflex microscopic     Status: Abnormal   Collection Time: 01/21/15  8:20 AM  Result Value Ref Range   Color, Urine YELLOW YELLOW   APPearance CLEAR CLEAR   Specific Gravity, Urine 1.007 1.005 - 1.030   pH 6.5 5.0 - 8.0   Glucose, UA NEGATIVE NEGATIVE mg/dL   Hgb urine dipstick TRACE (A) NEGATIVE   Bilirubin Urine NEGATIVE NEGATIVE   Ketones, ur NEGATIVE NEGATIVE mg/dL   Protein, ur NEGATIVE NEGATIVE mg/dL   Urobilinogen, UA 0.2 0.0 - 1.0 mg/dL   Nitrite NEGATIVE NEGATIVE   Leukocytes, UA NEGATIVE NEGATIVE  Urine microscopic-add on     Status: None   Collection Time: 01/21/15  8:20 AM  Result Value Ref Range   Squamous Epithelial / LPF RARE RARE    Comment: LESS THAN 10 mL OF URINE SUBMITTED   WBC, UA 0-2 <3 WBC/hpf   RBC / HPF 0-2 <3 RBC/hpf   Bacteria, UA RARE RARE  CBC     Status: Abnormal   Collection Time: 01/21/15 11:45 AM  Result Value Ref Range   WBC 12.3 (H) 4.0 - 10.5 K/uL   RBC 3.85 (L) 3.87 - 5.11 MIL/uL   Hemoglobin 12.7 12.0 - 15.0 g/dL   HCT 37.3 36.0 - 46.0 %   MCV 96.9 78.0 - 100.0 fL   MCH 33.0 26.0 - 34.0 pg   MCHC 34.0 30.0 - 36.0 g/dL   RDW 12.9 11.5 - 15.5 %   Platelets 189 150 - 400 K/uL  Creatinine, serum     Status: Abnormal   Collection Time: 01/21/15 11:45 AM  Result Value Ref Range   Creatinine, Ser 0.84 0.50 - 1.10 mg/dL   GFR calc non Af Amer 70 (L) >90 mL/min   GFR calc Af Amer 81 (L) >90 mL/min     Comment: (NOTE) The eGFR has been calculated using the CKD EPI equation. This calculation has not been validated in all clinical situations. eGFR's persistently <90 mL/min signify possible Chronic Kidney Disease.   Lipid panel     Status: Abnormal   Collection Time: 01/22/15 12:00 AM  Result Value Ref Range   Cholesterol 116 0 - 200 mg/dL   Triglycerides 146 <150 mg/dL   HDL 34 (L) >39 mg/dL   Total CHOL/HDL Ratio 3.4 RATIO   VLDL 29 0 - 40 mg/dL   LDL Cholesterol 53 0 - 99 mg/dL    Comment:        Total Cholesterol/HDL:CHD Risk Coronary Heart Disease Risk Table                     Men  Women  1/2 Average Risk   3.4   3.3  Average Risk       5.0   4.4  2 X Average Risk   9.6   7.1  3 X Average Risk  23.4   11.0        Use the calculated Patient Ratio above and the CHD Risk Table to determine the patient's CHD Risk.        ATP III CLASSIFICATION (LDL):  <100     mg/dL   Optimal  100-129  mg/dL   Near or Above                    Optimal  130-159  mg/dL   Borderline  160-189  mg/dL   High  >190     mg/dL   Very High    Ct Angio Head W/cm &/or Wo Cm  01/20/2015   CLINICAL DATA:  Pipeline stent placed for left MCA aneurysm. Postoperative complaints of left arm and leg weakness.  EXAM: CT ANGIOGRAPHY HEAD AND NECK  TECHNIQUE: Multidetector CT imaging of the head and neck was performed using the standard protocol during bolus administration of intravenous contrast. Multiplanar CT image reconstructions and MIPs were obtained to evaluate the vascular anatomy. Carotid stenosis measurements (when applicable) are obtained utilizing NASCET criteria, using the distal internal carotid diameter as the denominator.  CONTRAST:  79mL OMNIPAQUE IOHEXOL 350 MG/ML SOLN  COMPARISON:  Angiography same day.  CTA 12/03/2014.  FINDINGS: CT HEAD  Brain: Previous core allowing upper right vertebral aneurysm. Previous clipping of the right MCA aneurysm. Placement of pipeline stent in the left MCA region  today. No evidence of intracranial hemorrhage. 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 No sign of acute infarction by CT.  Calvarium and skull base: Postoperative changes.  Nothing a acute.  Paranasal sinuses: Clear  CTA NECK  Aortic arch: There is advanced atherosclerotic disease of the arch with calcified plaque in either soft plaque or thrombus along the inferior margin of the arch. 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 Branching pattern of brachiocephalic vessels from the arch is normal without origin stenosis.  Right carotid system: Right common carotid artery widely patent to the bifurcation. Ordinary mild atherosclerotic disease at the carotid bifurcation without stenosis or irregularity. Cervical internal carotid artery widely patent.  Left carotid system: Left common carotid artery affected by diffuse atherosclerotic disease. Narrowing in the proximal portion of 40%. Vessel patent to the bifurcation with mild atherosclerotic change at the bifurcation but no stenosis or irregularity.  Vertebral arteries:Right vertebral artery dominant. Both vertebral artery origins are widely patent. Both vessels are patent through the cervical region to the foramen magnum.  Skeleton: Ordinary cervical spondylosis  Other neck: No significant soft tissue lesion.  CTA HEAD  Anterior circulation: Both carotid siphon regions widely patent. On the right, the anterior and middle cerebral vessels are patent. There is artifact related to previous MCA aneurysm clipping. I do not see any missing vessels on the right compared to the previous study of 12/03/2014. On the left, pipeline stent is in place beginning at the M1 segment and extending into 1 of the left MCA branches. This spans the location of the fusiform aneurysm of the MCA branch, which measures 2.6 x 5.4 mm. No missing branch vessels are seen on the left. One could question if there is mild spasm of  the MCA branch of  medially beyond the and of the stent. Beyond that area, the vessel appears normal as it did before.  Posterior circulation: The right vertebral artery is a large vessel widely patent to the basilar. 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 The left vertebral artery is a small vessel that largely terminates in PICA. There is not definitely any flow beyond PICA to the basilar. The basilar artery shows mild atherosclerotic irregularity but no flow-limiting stenosis. Superior cerebellar and posterior cerebral vessels are patent and normal.  Venous sinuses: Patent and normal.  Anatomic variants: No insignificant  Delayed phase: No significant finding  IMPRESSION: Pipeline stent placed in the left MCA spanning a left MCA aneurysm. No complications seen relative to that. No missing vessels. No hemorrhage. One could question mild spasm of the MCA branch just beyond the end of the stent, but the vessel is widely patent beyond that and appears as it did before.  Previously clipped right MCA region aneurysm. No missing vessels demonstrated on the right compared to the study of 12/03/2014.  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   Electronically Signed   By: Nelson Chimes M.D.   On: 01/20/2015 17:50   Ct Head Wo Contrast  01/22/2015   CLINICAL DATA:  Status post pipeline stent placed 01/20/2015, postoperative left arm end like weakness  EXAM: CT HEAD WITHOUT CONTRAST  TECHNIQUE: Contiguous axial images were obtained from the base of the skull through the vertex without intravenous contrast.  COMPARISON:  01/20/2015  FINDINGS: Postoperative changes are noted in the calvarium on the right consistent with prior aneurysm clipping. There are changes consistent with aneurysm clipping lung course of the right middle cerebral artery as well as prior coiling along the course of the right posterior inferior cerebellar artery. These are  stable from the prior exam. A new left middle cerebral artery pipeline stent is again noted.  A rounded area of decreased attenuation is noted within the right thalamus measuring approximately 13 mm. This has progressed in the interval from the prior exam consistent with an evolving area of ischemia. No other areas of acute hemorrhage, acute infarction or acute ischemia are noted.  IMPRESSION: Changes consistent with prior aneurysm coiling, clipping and stenting as described.  New rounded area of decreased attenuation in the right thalamus laterally consistent with an evolving area of ischemia.  These results will be called to the ordering clinician or representative by the Radiologist Assistant, and communication documented in the PACS or zVision Dashboard.   Electronically Signed   By: Inez Catalina M.D.   On: 01/22/2015 07:33   Ct Head Wo Contrast  01/20/2015   CLINICAL DATA:  Pipeline stent placed for left MCA aneurysm. Postoperative complaints of left arm and leg weakness.  EXAM: CT ANGIOGRAPHY HEAD AND NECK  TECHNIQUE: Multidetector CT imaging of the head and neck was performed using the standard protocol during bolus administration of intravenous contrast. Multiplanar CT image reconstructions and MIPs were obtained to evaluate the vascular anatomy. Carotid stenosis measurements (when applicable) are obtained utilizing NASCET criteria, using the distal internal carotid diameter as the denominator.  CONTRAST:  39mL OMNIPAQUE IOHEXOL 350 MG/ML SOLN  COMPARISON:  Angiography same day.  CTA 12/03/2014.  FINDINGS: CT HEAD  Brain: Previous core allowing upper right vertebral aneurysm. Previous clipping of the right MCA aneurysm. Placement of pipeline stent in the left MCA region today. No evidence of intracranial hemorrhage. 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 No sign  of acute infarction by CT.  Calvarium and skull base: Postoperative changes.  Nothing a acute.  Paranasal sinuses: Clear  CTA NECK   Aortic arch: There is advanced atherosclerotic disease of the arch with calcified plaque in either soft plaque or thrombus along the inferior margin of the arch. 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 Branching pattern of brachiocephalic vessels from the arch is normal without origin stenosis.  Right carotid system: Right common carotid artery widely patent to the bifurcation. Ordinary mild atherosclerotic disease at the carotid bifurcation without stenosis or irregularity. Cervical internal carotid artery widely patent.  Left carotid system: Left common carotid artery affected by diffuse atherosclerotic disease. Narrowing in the proximal portion of 40%. Vessel patent to the bifurcation with mild atherosclerotic change at the bifurcation but no stenosis or irregularity.  Vertebral arteries:Right vertebral artery dominant. Both vertebral artery origins are widely patent. Both vessels are patent through the cervical region to the foramen magnum.  Skeleton: Ordinary cervical spondylosis  Other neck: No significant soft tissue lesion.  CTA HEAD  Anterior circulation: Both carotid siphon regions widely patent. On the right, the anterior and middle cerebral vessels are patent. There is artifact related to previous MCA aneurysm clipping. I do not see any missing vessels on the right compared to the previous study of 12/03/2014. On the left, pipeline stent is in place beginning at the M1 segment and extending into 1 of the left MCA branches. This spans the location of the fusiform aneurysm of the MCA branch, which measures 2.6 x 5.4 mm. No missing branch vessels are seen on the left. One could question if there is mild spasm of the MCA branch of medially beyond the and of the stent. Beyond that area, the vessel appears normal as it did before.  Posterior circulation: The right vertebral artery is a large vessel widely patent to the basilar. Previously coiled right  vertebral aneurysm without evidence of recanalization. The left vertebral artery is a small vessel that largely terminates in PICA. There is not definitely any flow beyond PICA to the basilar. The basilar artery shows mild atherosclerotic irregularity but no flow-limiting stenosis. Superior cerebellar and posterior cerebral vessels are patent and normal.  Venous sinuses: Patent and normal.  Anatomic variants: No insignificant  Delayed phase: No significant finding  IMPRESSION: Pipeline stent placed in the left MCA spanning a left MCA aneurysm. No complications seen relative to that. No missing vessels. No hemorrhage. One could question mild spasm of the MCA branch just beyond the end of the stent, but the vessel is widely patent beyond that and appears as it did before.  Previously clipped right MCA region aneurysm. No missing vessels demonstrated on the right compared to the study of 12/03/2014.  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   Electronically Signed   By: Nelson Chimes M.D.   On: 01/20/2015 17:50   Ct Angio Neck W/cm &/or Wo/cm  01/20/2015   CLINICAL DATA:  Pipeline stent placed for left MCA aneurysm. Postoperative complaints of left arm and leg weakness.  EXAM: CT ANGIOGRAPHY HEAD AND NECK  TECHNIQUE: Multidetector CT imaging of the head and neck was performed using the standard protocol during bolus administration of intravenous contrast. Multiplanar CT image reconstructions and MIPs were obtained to evaluate the vascular anatomy. Carotid stenosis measurements (when applicable) are obtained utilizing NASCET criteria, using the distal internal carotid diameter as the denominator.  CONTRAST:  9mL OMNIPAQUE IOHEXOL 350 MG/ML SOLN  COMPARISON:  Angiography same day.  CTA 12/03/2014.  FINDINGS: CT HEAD  Brain: Previous core allowing upper right vertebral aneurysm. Previous clipping of the right MCA aneurysm. Placement of pipeline stent in the left MCA region today.  No evidence of intracranial hemorrhage. 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 No sign of acute infarction by CT.  Calvarium and skull base: Postoperative changes.  Nothing a acute.  Paranasal sinuses: Clear  CTA NECK  Aortic arch: There is advanced atherosclerotic disease of the arch with calcified plaque in either soft plaque or thrombus along the inferior margin of the arch. 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 Branching pattern of brachiocephalic vessels from the arch is normal without origin stenosis.  Right carotid system: Right common carotid artery widely patent to the bifurcation. Ordinary mild atherosclerotic disease at the carotid bifurcation without stenosis or irregularity. Cervical internal carotid artery widely patent.  Left carotid system: Left common carotid artery affected by diffuse atherosclerotic disease. Narrowing in the proximal portion of 40%. Vessel patent to the bifurcation with mild atherosclerotic change at the bifurcation but no stenosis or irregularity.  Vertebral arteries:Right vertebral artery dominant. Both vertebral artery origins are widely patent. Both vessels are patent through the cervical region to the foramen magnum.  Skeleton: Ordinary cervical spondylosis  Other neck: No significant soft tissue lesion.  CTA HEAD  Anterior circulation: Both carotid siphon regions widely patent. On the right, the anterior and middle cerebral vessels are patent. There is artifact related to previous MCA aneurysm clipping. I do not see any missing vessels on the right compared to the previous study of 12/03/2014. On the left, pipeline stent is in place beginning at the M1 segment and extending into 1 of the left MCA branches. This spans the location of the fusiform aneurysm of the MCA branch, which measures 2.6 x 5.4 mm. No missing branch vessels are seen on the left. One could question if there is mild spasm of the MCA  branch of medially beyond the and of the stent. Beyond that area, the vessel appears normal as it did before.  Posterior circulation: The right vertebral artery is a large vessel widely patent to the basilar. 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 The left vertebral artery is a small vessel that largely terminates in PICA. There is not definitely any flow beyond PICA to the basilar. The basilar artery shows mild atherosclerotic irregularity but no flow-limiting stenosis. Superior cerebellar and posterior cerebral vessels are patent and normal.  Venous sinuses: Patent and normal.  Anatomic variants: No insignificant  Delayed phase: No significant finding  IMPRESSION: Pipeline stent placed in the left MCA spanning a left MCA aneurysm. No complications seen relative to that. No missing vessels. No hemorrhage. One could question mild spasm of the MCA branch just beyond the end of the stent, but the vessel is widely patent beyond that and appears as it did before.  Previously clipped right MCA region aneurysm. No missing vessels demonstrated on the right compared to the study of 12/03/2014.  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   Electronically Signed   By: Nelson Chimes M.D.   On: 01/20/2015 17:50   Ir Transcath/emboliz  01/22/2015   CLINICAL DATA:  Progressive headaches. History of multiple intracranial aneurysms. 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  EXAM: TRANSCATHETER THERAPY EMBOLIZATION OF LEFT MIDDLE CEREBRAL ARTERY REGION ANEURYSM  ANESTHESIA/SEDATION: General anesthesia.  MEDICATIONS: As per general anesthesia.  CONTRAST:  158mL OMNIPAQUE IOHEXOL 300 MG/ML  SOLN  PROCEDURE: Following a full explanation of the procedure along with the potential  associated complications, an informed witnessed consent was obtained.  The right groin was prepped and draped in the usual sterile fashion. Thereafter using modified Seldinger technique, transfemoral access into right common femoral artery was obtained without difficulty. Over a 0.035 inch guidewire, a 5 French Pinnacle sheath was inserted. Through this, and also over a 0.035 inch guidewire, 5 French JB1 catheter was advanced to the aortic arch region and selectively positioned in the right common carotid artery, the right vertebral artery, and the left common carotid artery. The patient tolerated the procedure well. A 3D rotational arteriogram was also performed of the left intracranial circulation from a left-sided common carotid artery injection. Reconstructions were performed on a separate workstation.  COMPLICATIONS: None immediate.  FINDINGS: The right common carotid arteriogram demonstrates the right external carotid artery and its major branches to be widely patent.  The right internal carotid artery at the bulb and its proximal one-third is patent.  There is a modest kink at the junction of the middle and the one-third of the right internal carotid artery without impedance of any blood flow distally.  More distally the vessel is seen to opacify normally to the cranial skull base.  The petrous, cavernous and supraclinoid segments are widely patent.  The right middle and the right anterior cerebral arteries are seen to opacify normally into the capillary and venous phases.  The right pericallosal artery is seen to cross the midline and supply the distal anterior cerebral artery distribution on the left.  Also seen are the previously positioned clips in the right middle cerebral artery distribution bifurcation. There is a mild fusiform prominence noted proximal to the clips at the inferior division of the right middle cerebral artery.  However, no angiographic change is noted from the previous catheter  angiogram.  The vertebral artery origin is normal.  This is a dominant vertebral which it is seen to opacify to the cranial skull base. The previously endovascularly coiled right posterior-inferior cerebellar artery remains completely obliterated without evidence of recanalization or of coil compaction.  The right posterior-inferior cerebellar artery remains widely patent.  The distal basilar artery, the posterior cerebral arteries, the superior cerebellar arteries and the anterior inferior cerebellar arteries opacify normally into the capillary and venous phases.  The left common carotid arteriogram demonstrates the left external carotid artery and its major branches to be normal.  The left internal carotid artery at the bulb to the cranial skull base opacifies normally.  The petrous, the cavernous and the supraclinoid segments are widely patent. The left middle cerebral artery and the left anterior cerebral artery are seen to opacify normally into the capillary and venous phases.  Arising in the region of the superior and the inferior divisions of the left middle cerebral artery again seen is a saccular outpouching projecting inferiorly and slightly anteriorly.  A 3D rotational arteriogram performed with reconstruction on a separate workstation confirms the presence of a wide neck lobulated aneurysm measuring approximately 5 mm x 4.5 mm. This is a wide neck which extends mostly into the origin of the inferior division.  ENDOVASCULAR STAGED EMBOLIZATION OF WIDE NECK LOBULATED LEFT MCA BIFURCATION ANEURYSM USING THE  LVIS JR STENT.  The angiographic findings were reviewed with the patient and the patient's family. Informed consent was obtained. The patient was then put under general anesthesia by the Department of Anesthesiology at [facility] catheter in the left common carotid artery was then exchanged over a 0.035 inch 300 cm Constance Holster exchange guidewire for a 6 French 80 cm Cook shuttle  sheath using biplane roadmap technique and constant fluoroscopic guidance. Good aspiration was obtained from the side port of the Tuohy-Borst at the hub of the [place] shuttle sheath. A gentle contrast injection demonstrated no evidence of spasms, dissections or intraluminal filling defects at the distal end of the guide catheter in the left common carotid artery. This was then connected to continuous heparinized saline infusion.  A 6 French 115 cm Navien guide catheter was then advanced just distal to the tip of the [place] shuttle sheath in the distal left common carotid artery. The guidewire was removed. Good aspiration was then obtained from the hub of the 6 [place] Navien guide catheter. A gentle contrast injection revealed no evidence of spasms, dissections or of intraluminal filling defects.  Over a 0.035 inch Roadrunner guidewire, using biplane roadmap technique, the 6 [place] Navien guide catheter was then advanced to the distal cervical left ICA and positioned at the cervical petrous junction. The guidewire was removed. Good aspiration was obtained from the hub of the 6 [place] Navien guide catheter.  At this time in a coaxial manner and with constant heparinized saline infusion using biplane roadmap technique and constant fluoroscopic guidance, over a 0.014 inch Softip Synchro micro guidewire, a Headway 17 2 tip micro catheter which had been steam-shaped was then advanced to the distal end of the 6 [place] Navien guide catheter. The micro guidewire was then gently manipulated using a torque device into the left middle cerebral artery M1 segment followed by the micro catheter. The micro guidewire was then advanced and positioned just proximal to the level of the aneurysm neck at the origin of the inferior division of the left middle cerebral artery. Multiple attempts were then made to advance the micro guidewire with different distal configurations into the inferior divisions. All of these were  unsuccessful.  The micro catheter was then advanced to the M2 M3 region of the superior division of the left middle cerebral artery. The guidewire was removed. Good aspiration was obtained from the hub of the Headway micro catheter. This was then connected to continuous heparinized saline infusion.  Through the second port of the Tuohy-Borst at the hub of the 6 [place] Navien guide catheter, an SL 10 2-tip micro catheter with a 45 degree angulation was then advanced over 0.0148 inch Softip Synchro micro guidewire to the distal end of the 6 [place] Navien guide catheter in the left internal carotid artery. With the micro guidewire leading with a J-tip configuration, the combination was then advanced to the left middle cerebral artery M1 segment. The micro guidewire was then advanced into the aneurysm followed by the SL 10 45 degrees angle micro catheter. The micro guidewire was advanced within the aneurysm under constant fluoroscopic guidance gingerly such that the tip of the wire was now pointing into the origin of the inferior division of the left middle cerebral artery. This was then followed by the micro catheter. The micro guidewire was then gently manipulated with access obtained into the inferior division. The wire was then advanced distally into the inferior division followed by the SL 10  micro catheter carefully. There was free advancement of the micro catheter to the M3 region of the left middle cerebral artery inferior division. The micro guidewire was then removed. Good aspiration was obtained from the hub of the SL 10 micro catheter. A gentle contrast injection demonstrated antegrade flow distally. This was then connected to continuous heparinized saline infusion.  At this time a 2.5 mm x 34 mm LVIS Jr stent with a working length of 30 mm was then advanced in a coaxial manner and with constant heparinized saline infusion using biplane roadmap technique and constant fluoroscopic guidance to the distal end  of the SL 10 micro catheter. The O ring on the delivery micro guidewire and the delivery micro catheter were then gently loosened. With slight forward gentle traction with the right hand on the delivery micro guidewire, with the left hand the SL 10 micro catheter was gently retrieved in a controlled gradual slow fashion unsheathing the LVIS stent.  This was continued until the entire stent was deployed. A control arteriogram performed through the 6 [place] Navien guide catheter demonstrated excellent apposition in the left middle cerebral artery M1 segment and the M2 region of the inferior division of the left middle cerebral artery. However, there was significant narrowing noted in the stent just distal to its entry into the inferior division.  This was then subsequently dilated with a 0.012 inch J-tipped Headliner micro guidewire which was advanced to the focal narrowing within the stent followed by the micro catheter. The micro guidewire was then gingerly advanced to and fro at the site of the superior stenosis. The wire was then retrieved proximally as was the micro catheter. A control arteriogram performed through the 6 [place] Navien guide catheter demonstrated significantly improved caliber of the stent at this point with excellent flow into the vessels.  Control arteriograms were then performed at 15, 30 and 40 minutes post stent deployment. These continued to demonstrate excellent flow. There was suspicion of a small filling defect along the distal aspect of the LVIS stent. This prompted the use of approximately 4 mg of superselective intracranial Integrelin over about 4 minutes.  Control arteriograms continued to demonstrate excellent flow with opening of the LVIS stent within the aneurysm itself.  A final control arteriogram performed through the 6 [place] Navien guide catheter continued to demonstrate excellent flow without any intraluminal filling defects.  This was then retrieved into the abdominal aorta  along with the [place] shuttle sheath. This was then exchanged out for a 6 [place] Pinnacle sheath over a 0.035 inch guidewire. The sheath itself was then removed with the successful application of an external closure device.  The patient's ACT throughout the procedure was maintained in the region of 180 seconds.  No acute neurological or hemodynamic changes were seen throughout the procedure.  The patient's general anesthesia was then reversed and the patient was extubated. Upon recovery the patient demonstrated no new neurological signs or symptoms. Memory and orientation remained stable as did her motor, sensory and coordination function. She was then transferred to the neuro ICU to continue her IV heparin and to control her blood pressure on IV Cardene within the parameters.  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   Electronically Signed   By: Luanne Bras M.D.   On: 01/21/2015 13:30   Ir Angiogram Follow Up Study  01/22/2015  CLINICAL DATA:  Progressive headaches. History of multiple intracranial aneurysms. 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  EXAM: TRANSCATHETER THERAPY EMBOLIZATION OF LEFT MIDDLE CEREBRAL ARTERY REGION ANEURYSM  ANESTHESIA/SEDATION: General anesthesia.  MEDICATIONS: As per general anesthesia.  CONTRAST:  114mL OMNIPAQUE IOHEXOL 300 MG/ML  SOLN  PROCEDURE: Following a full explanation of the procedure along with the potential associated complications, an informed witnessed consent was obtained.  The right groin was prepped and draped in the usual sterile fashion. Thereafter using modified Seldinger technique, transfemoral access into right  common femoral artery was obtained without difficulty. Over a 0.035 inch guidewire, a 5 French Pinnacle sheath was inserted. Through this, and also over a 0.035 inch guidewire, 5 French JB1 catheter was advanced to the aortic arch region and selectively positioned in the right common carotid artery, the right vertebral artery, and the left common carotid artery. The patient tolerated the procedure well. A 3D rotational arteriogram was also performed of the left intracranial circulation from a left-sided common carotid artery injection. Reconstructions were performed on a separate workstation.  COMPLICATIONS: None immediate.  FINDINGS: The right common carotid arteriogram demonstrates the right external carotid artery and its major branches to be widely patent.  The right internal carotid artery at the bulb and its proximal one-third is patent.  There is a modest kink at the junction of the middle and the one-third of the right internal carotid artery without impedance of any blood flow distally.  More distally the vessel is seen to opacify normally to the cranial skull base.  The petrous, cavernous and supraclinoid segments are widely patent.  The right middle and the right anterior cerebral arteries are seen to opacify normally into the capillary and venous phases.  The right pericallosal artery is seen to cross the midline and supply the distal anterior cerebral artery distribution on the left.  Also seen are the previously positioned clips in the right middle cerebral artery distribution bifurcation. There is a mild fusiform prominence noted proximal to the clips at the inferior division of the right middle cerebral artery.  However, no angiographic change is noted from the previous catheter angiogram.  The vertebral artery origin is normal.  This is a dominant vertebral which it is seen to opacify to the cranial skull base. The previously endovascularly coiled right posterior-inferior cerebellar artery remains  completely obliterated without evidence of recanalization or of coil compaction.  The right posterior-inferior cerebellar artery remains widely patent.  The distal basilar artery, the posterior cerebral arteries, the superior cerebellar arteries and the anterior inferior cerebellar arteries opacify normally into the capillary and venous phases.  The left common carotid arteriogram demonstrates the left external carotid artery and its major branches to be normal.  The left internal carotid artery at the bulb to the cranial skull base opacifies normally.  The petrous, the cavernous and the supraclinoid segments are widely patent. The left middle cerebral artery and the left anterior cerebral artery are seen to opacify normally into the capillary and venous phases.  Arising in the region of the superior and the inferior divisions of the left middle cerebral artery again seen is a saccular outpouching projecting inferiorly and slightly anteriorly.  A 3D rotational arteriogram performed with reconstruction on a separate workstation confirms the presence of a wide neck lobulated aneurysm measuring approximately 5 mm x 4.5 mm. This is a wide  neck which extends mostly into the origin of the inferior division.  ENDOVASCULAR STAGED EMBOLIZATION OF WIDE NECK LOBULATED LEFT MCA BIFURCATION ANEURYSM USING THE LVIS JR STENT.  The angiographic findings were reviewed with the patient and the patient's family. Informed consent was obtained. The patient was then put under general anesthesia by the Department of Anesthesiology at [place] catheter in the left common carotid artery was then exchanged over a 0.035 inch 300 cm Constance Holster exchange guidewire for a 6 French 80 cm [place] shuttle sheath using biplane roadmap technique and constant fluoroscopic guidance. Good aspiration was obtained from the side port of the Tuohy-Borst at the hub of the [place] shuttle sheath. A gentle contrast injection  demonstrated no evidence of spasms, dissections or intraluminal filling defects at the distal end of the guide catheter in the left common carotid artery. This was then connected to continuous heparinized saline infusion.  A 6 French 115 cm Navien guide catheter was then advanced just distal to the tip of the [place] shuttle sheath in the distal left common carotid artery. The guidewire was removed. Good aspiration was then obtained from the hub of the 6 [place] Navien guide catheter. A gentle contrast injection revealed no evidence of spasms, dissections or of intraluminal filling defects.  Over a 0.035 inch Roadrunner guidewire, using biplane roadmap technique, the 6 [place] Navien guide catheter was then advanced to the distal cervical left ICA and positioned at the cervical petrous junction. The guidewire was removed. Good aspiration was obtained from the hub of the 6 [place] Navien guide catheter.  At this time in a coaxial manner and with constant heparinized saline infusion using biplane roadmap technique and constant fluoroscopic guidance, over a 0.014 inch Softip Synchro micro guidewire, a Headway 17 2 tip micro catheter which had been steam-shaped was then advanced to the distal end of the 6 [place] Navien guide catheter. The micro guidewire was then gently manipulated using a torque device into the left middle cerebral artery M1 segment followed by the micro catheter. The micro guidewire was then advanced and positioned just proximal to the level of the aneurysm neck at the origin of the inferior division of the left middle cerebral artery. Multiple attempts were then made to advance the micro guidewire with different distal configurations into the inferior divisions. All of these were unsuccessful.  The micro catheter was then advanced to the M2 M3 region of the superior division of the left middle cerebral artery. The guidewire was removed. Good aspiration was obtained from the hub of the Headway micro  catheter. This was then connected to continuous heparinized saline infusion.  Through the second port of the Tuohy-Borst at the hub of the 6 [place] Navien guide catheter, an SL 10 2-tip micro catheter with a 45 degree angulation was then advanced over 0.0148 inch Softip Synchro micro guidewire to the distal end of the 6 [place] Navien guide catheter in the left internal carotid artery. With the micro guidewire leading with a J-tip configuration, the combination was then advanced to the left middle cerebral artery M1 segment. The micro guidewire was then advanced into the aneurysm followed by the SL 10 45 degrees angle micro catheter. The micro guidewire was advanced within the aneurysm under constant fluoroscopic guidance gingerly such that the tip of the wire was now pointing into the origin of the inferior division of the left middle cerebral artery. This was then followed by the micro catheter. The micro guidewire was  then gently manipulated with access obtained into the inferior division. The wire was then advanced distally into the inferior division followed by the SL 10 micro catheter carefully. There was free advancement of the micro catheter to the M3 region of the left middle cerebral artery inferior division. The micro guidewire was then removed. Good aspiration was obtained from the hub of the SL 10 micro catheter. A gentle contrast injection demonstrated antegrade flow distally. This was then connected to continuous heparinized saline infusion.  At this time a 2.5 mm x 34 mm LVIS Jr stent with a working length of 30 mm was then advanced in a coaxial manner and with constant heparinized saline infusion using biplane roadmap technique and constant fluoroscopic guidance to the distal end of the SL 10 micro catheter. The O ring on the delivery micro guidewire and the delivery micro catheter were then gently loosened. With slight forward gentle traction with the right hand on the delivery micro guidewire, with  the left hand the SL 10 micro catheter was gently retrieved in a controlled gradual slow fashion unsheathing the LVIS stent.  This was continued until the entire stent was deployed. A control arteriogram performed through the 6 [place] Navien guide catheter demonstrated excellent apposition in the left middle cerebral artery M1 segment and the M2 region of the inferior division of the left middle cerebral artery. However, there was significant narrowing noted in the stent just distal to its entry into the inferior division.  This was then subsequently dilated with a 0.012 inch J-tipped Headliner micro guidewire which was advanced to the focal narrowing within the stent followed by the micro catheter. The micro guidewire was then gingerly advanced to and fro at the site of the superior stenosis. The wire was then retrieved proximally as was the micro catheter. A control arteriogram performed through the 6 [place] Navien guide catheter demonstrated significantly improved caliber of the stent at this point with excellent flow into the vessels.  Control arteriograms were then performed at 15, 30 and 40 minutes post stent deployment. These continued to demonstrate excellent flow. There was suspicion of a small filling defect along the distal aspect of the LVIS stent. This prompted the use of approximately 4 mg of superselective intracranial Integrelin over about 4 minutes.  Control arteriograms continued to demonstrate excellent flow with opening of the LVIS stent within the aneurysm itself.  A final control arteriogram performed through the 6 [place] Navien guide catheter continued to demonstrate excellent flow without any intraluminal filling defects.  This was then retrieved into the abdominal aorta along with the [place] shuttle sheath. This was then exchanged out for a 6 [place] Pinnacle sheath over a 0.035 inch guidewire. The sheath itself was then removed with the successful application of an external closure  device.  The patient's ACT throughout the procedure was maintained in the region of 180 seconds.  No acute neurological or hemodynamic changes were seen throughout the procedure.  The patient's general anesthesia was then reversed and the patient was extubated. Upon recovery the patient demonstrated no new neurological signs or symptoms. Memory and orientation remained stable as did her motor, sensory and coordination function. She was then transferred to the neuro ICU to continue her IV heparin and to control her blood pressure on IV Cardene within the parameters.  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  Electronically Signed   By: Luanne Bras M.D.   On: 01/21/2015 13:30   Ir 3d Primitivo Gauze Darreld Mclean  01/22/2015   CLINICAL DATA:  Progressive headaches. History of multiple intracranial aneurysms. 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  EXAM: TRANSCATHETER THERAPY EMBOLIZATION OF LEFT MIDDLE CEREBRAL ARTERY REGION ANEURYSM  ANESTHESIA/SEDATION: General anesthesia.  MEDICATIONS: As per general anesthesia.  CONTRAST:  166mL OMNIPAQUE IOHEXOL 300 MG/ML  SOLN  PROCEDURE: Following a full explanation of the procedure along with the potential associated complications, an informed witnessed consent was obtained.  The right groin was prepped and draped in the usual sterile fashion. Thereafter using modified Seldinger technique, transfemoral access into right common femoral artery was obtained without difficulty. Over a 0.035 inch guidewire, a 5 French Pinnacle sheath was inserted. Through this, and also over a 0.035 inch guidewire, 5 French JB1 catheter was advanced to the aortic arch  region and selectively positioned in the right common carotid artery, the right vertebral artery, and the left common carotid artery. The patient tolerated the procedure well. A 3D rotational arteriogram was also performed of the left intracranial circulation from a left-sided common carotid artery injection. Reconstructions were performed on a separate workstation.  COMPLICATIONS: None immediate.  FINDINGS: The right common carotid arteriogram demonstrates the right external carotid artery and its major branches to be widely patent.  The right internal carotid artery at the bulb and its proximal one-third is patent.  There is a modest kink at the junction of the middle and the one-third of the right internal carotid artery without impedance of any blood flow distally.  More distally the vessel is seen to opacify normally to the cranial skull base.  The petrous, cavernous and supraclinoid segments are widely patent.  The right middle and the right anterior cerebral arteries are seen to opacify normally into the capillary and venous phases.  The right pericallosal artery is seen to cross the midline and supply the distal anterior cerebral artery distribution on the left.  Also seen are the previously positioned clips in the right middle cerebral artery distribution bifurcation. There is a mild fusiform prominence noted proximal to the clips at the inferior division of the right middle cerebral artery.  However, no angiographic change is noted from the previous catheter angiogram.  The vertebral artery origin is normal.  This is a dominant vertebral which it is seen to opacify to the cranial skull base. The previously endovascularly coiled right posterior-inferior cerebellar artery remains completely obliterated without evidence of recanalization or of coil compaction.  The right posterior-inferior cerebellar artery remains widely patent.  The distal basilar artery, the posterior cerebral arteries, the superior  cerebellar arteries and the anterior inferior cerebellar arteries opacify normally into the capillary and venous phases.  The left common carotid arteriogram demonstrates the left external carotid artery and its major branches to be normal.  The left internal carotid artery at the bulb to the cranial skull base opacifies normally.  The petrous, the cavernous and the supraclinoid segments are widely patent. The left middle cerebral artery and the left anterior cerebral artery are seen to opacify normally into the capillary and venous phases.  Arising in the region of the superior and the inferior divisions of the left middle cerebral artery again seen is a saccular outpouching projecting inferiorly and slightly anteriorly.  A 3D rotational arteriogram performed with reconstruction  on a separate workstation confirms the presence of a wide neck lobulated aneurysm measuring approximately 5 mm x 4.5 mm. This is a wide neck which extends mostly into the origin of the inferior division.  ENDOVASCULAR STAGED EMBOLIZATION OF WIDE NECK LOBULATED LEFT MCA BIFURCATION ANEURYSM USING THE LVIS JR STENT.  The angiographic findings were reviewed with the patient and the patient's family. Informed consent was obtained. The patient was then put under general anesthesia by the Department of Anesthesiology at [place] catheter in the left common carotid artery was then exchanged over a 0.035 inch 300 cm Constance Holster exchange guidewire for a 6 French 80 cm Cook shuttle sheath using biplane roadmap technique and constant fluoroscopic guidance. Good aspiration was obtained from the side port of the Tuohy-Borst at the hub of the [place] shuttle sheath. A gentle contrast injection demonstrated no evidence of spasms, dissections or intraluminal filling defects at the distal end of the guide catheter in the left common carotid artery. This was then connected to continuous heparinized saline infusion.  A 6 French  115 cm Navien guide catheter was then advanced just distal to the tip of the [place] shuttle sheath in the distal left common carotid artery. The guidewire was removed. Good aspiration was then obtained from the hub of the 6 [place] Navien guide catheter. A gentle contrast injection revealed no evidence of spasms, dissections or of intraluminal filling defects.  Over a 0.035 inch Roadrunner guidewire, using biplane roadmap technique, the 6 [place] Navien guide catheter was then advanced to the distal cervical left ICA and positioned at the cervical petrous junction. The guidewire was removed. Good aspiration was obtained from the hub of the 6 [place] Navien guide catheter.  At this time in a coaxial manner and with constant heparinized saline infusion using biplane roadmap technique and constant fluoroscopic guidance, over a 0.014 inch Softip Synchro micro guidewire, a Headway 17 2 tip micro catheter which had been steam-shaped was then advanced to the distal end of the 6 [place] Navien guide catheter. The micro guidewire was then gently manipulated using a torque device into the left middle cerebral artery M1 segment followed by the micro catheter. The micro guidewire was then advanced and positioned just proximal to the level of the aneurysm neck at the origin of the inferior division of the left middle cerebral artery. Multiple attempts were then made to advance the micro guidewire with different distal configurations into the inferior divisions. All of these were unsuccessful.  The micro catheter was then advanced to the M2 M3 region of the superior division of the left middle cerebral artery. The guidewire was removed. Good aspiration was obtained from the hub of the Headway micro catheter. This was then connected to continuous heparinized saline infusion.  Through the second port of the Tuohy-Borst at the hub of the 6 [place] Navien guide catheter, an SL 10 2-tip micro catheter with a 45 degree angulation was then  advanced over 0.0148 inch Softip Synchro micro guidewire to the distal end of the 6 [place] Navien guide catheter in the left internal carotid artery. With the micro guidewire leading with a J-tip configuration, the combination was then advanced to the left middle cerebral artery M1 segment. The micro guidewire was then advanced into the aneurysm followed by the SL 10 45 degrees angle micro catheter. The micro guidewire was advanced within the aneurysm under constant fluoroscopic guidance gingerly such that the tip of the wire was now pointing into  the origin of the inferior division of the left middle cerebral artery. This was then followed by the micro catheter. The micro guidewire was then gently manipulated with access obtained into the inferior division. The wire was then advanced distally into the inferior division followed by the SL 10 micro catheter carefully. There was free advancement of the micro catheter to the M3 region of the left middle cerebral artery inferior division. The micro guidewire was then removed. Good aspiration was obtained from the hub of the SL 10 micro catheter. A gentle contrast injection demonstrated antegrade flow distally. This was then connected to continuous heparinized saline infusion.  At this time a 2.5 mm x 34 mm LVIS Jr stent with a working length of 30 mm was then advanced in a coaxial manner and with constant heparinized saline infusion using biplane roadmap technique and constant fluoroscopic guidance to the distal end of the SL 10 micro catheter. The O ring on the delivery micro guidewire and the delivery micro catheter were then gently loosened. With slight forward gentle traction with the right hand on the delivery micro guidewire, with the left hand the SL 10 micro catheter was gently retrieved in a controlled gradual slow fashion unsheathing the LVIS stent.  This was continued until the entire stent was deployed. A control arteriogram performed through the 6 [place]  Navien guide catheter demonstrated excellent apposition in the left middle cerebral artery M1 segment and the M2 region of the inferior division of the left middle cerebral artery. However, there was significant narrowing noted in the stent just distal to its entry into the inferior division.  This was then subsequently dilated with a 0.012 inch J-tipped Headliner micro guidewire which was advanced to the focal narrowing within the stent followed by the micro catheter. The micro guidewire was then gingerly advanced to and fro at the site of the superior stenosis. The wire was then retrieved proximally as was the micro catheter. A control arteriogram performed through the 6 [place] Navien guide catheter demonstrated significantly improved caliber of the stent at this point with excellent flow into the vessels.  Control arteriograms were then performed at 15, 30 and 40 minutes post stent deployment. These continued to demonstrate excellent flow. There was suspicion of a small filling defect along the distal aspect of the LVIS stent. This prompted the use of approximately 4 mg of superselective intracranial Integrelin over about 4 minutes.  Control arteriograms continued to demonstrate excellent flow with opening of the LVIS stent within the aneurysm itself.  A final control arteriogram performed through the 6 [place] Navien guide catheter continued to demonstrate excellent flow without any intraluminal filling defects.  This was then retrieved into the abdominal aorta along with the [place] shuttle sheath. This was then exchanged out for a 6 [place] Pinnacle sheath over a 0.035 inch guidewire. The sheath itself was then removed with the successful application of an external closure device.  The patient's ACT throughout the procedure was maintained in the region of 180 seconds.  No acute neurological or hemodynamic changes were seen throughout the procedure.  The patient's general anesthesia was then reversed and  the patient was extubated. Upon recovery the patient demonstrated no new neurological signs or symptoms. Memory and orientation remained stable as did her motor, sensory and coordination function. She was then transferred to the neuro ICU to continue her IV heparin and to control her blood pressure on IV Cardene within the parameters.  IMPRESSION: Endovascular staged embolization of wide  neck lobulated complex aneurysm of the left middle cerebral artery bifurcation using the LVIS Jr stent device as described above without event.   Electronically Signed   By: Luanne Bras M.D.   On: 01/21/2015 13:30   Ir US Guide Vasc Access Right  01/22/2015   CLINICAL DATA:  Progressive headaches. History of multiple intracranial aneurysms. 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  EXAM: TRANSCATHETER THERAPY EMBOLIZATION OF LEFT MIDDLE CEREBRAL ARTERY REGION ANEURYSM  ANESTHESIA/SEDATION: General anesthesia.  MEDICATIONS: As per general anesthesia.  CONTRAST:  156mL OMNIPAQUE IOHEXOL 300 MG/ML  SOLN  PROCEDURE: Following a full explanation of the procedure along with the potential associated complications, an informed witnessed consent was obtained.  The right groin was prepped and draped in the usual sterile fashion. Thereafter using modified Seldinger technique, transfemoral access into right common femoral artery was obtained without difficulty. Over a 0.035 inch guidewire, a 5 French Pinnacle sheath was inserted. Through this, and also over a 0.035 inch guidewire, 5 French JB1 catheter was advanced to the aortic arch region and selectively positioned in the right common carotid artery, the right vertebral artery, and the left common carotid artery. The patient tolerated the procedure well. A 3D rotational arteriogram was also performed of  the left intracranial circulation from a left-sided common carotid artery injection. Reconstructions were performed on a separate workstation.  COMPLICATIONS: None immediate.  FINDINGS: The right common carotid arteriogram demonstrates the right external carotid artery and its major branches to be widely patent.  The right internal carotid artery at the bulb and its proximal one-third is patent.  There is a modest kink at the junction of the middle and the one-third of the right internal carotid artery without impedance of any blood flow distally.  More distally the vessel is seen to opacify normally to the cranial skull base.  The petrous, cavernous and supraclinoid segments are widely patent.  The right middle and the right anterior cerebral arteries are seen to opacify normally into the capillary and venous phases.  The right pericallosal artery is seen to cross the midline and supply the distal anterior cerebral artery distribution on the left.  Also seen are the previously positioned clips in the right middle cerebral artery distribution bifurcation. There is a mild fusiform prominence noted proximal to the clips at the inferior division of the right middle cerebral artery.  However, no angiographic change is noted from the previous catheter angiogram.  The vertebral artery origin is normal.  This is a dominant vertebral which it is seen to opacify to the cranial skull base. The previously endovascularly coiled right posterior-inferior cerebellar artery remains completely obliterated without evidence of recanalization or of coil compaction.  The right posterior-inferior cerebellar artery remains widely patent.  The distal basilar artery, the posterior cerebral arteries, the superior cerebellar arteries and the anterior inferior cerebellar arteries opacify normally into the capillary and venous phases.  The left common carotid arteriogram demonstrates the left external carotid artery and its major branches to be  normal.  The left internal carotid artery at the bulb to the cranial skull base opacifies normally.  The petrous, the cavernous and the supraclinoid segments are widely patent. The left middle cerebral artery and the left anterior cerebral artery are seen to opacify normally into the capillary and venous phases.  Arising in the region of the superior and the inferior  divisions of the left middle cerebral artery again seen is a saccular outpouching projecting inferiorly and slightly anteriorly.  A 3D rotational arteriogram performed with reconstruction on a separate workstation confirms the presence of a wide neck lobulated aneurysm measuring approximately 5 mm x 4.5 mm. This is a wide neck which extends mostly into the origin of the inferior division.  ENDOVASCULAR STAGED EMBOLIZATION OF WIDE NECK LOBULATED LEFT MCA BIFURCATION ANEURYSM USING THE LVIS JR STENT.  The angiographic findings were reviewed with the patient and the patient's family. Informed consent was obtained. The patient was then put under general anesthesia by the Department of Anesthesiology at [place] catheter in the left common carotid artery was then exchanged over a 0.035 inch 300 cm Constance Holster exchange guidewire for a 6 French 80 cm [place] shuttle sheath using biplane roadmap technique and constant fluoroscopic guidance. Good aspiration was obtained from the side port of the Tuohy-Borst at the hub of the [place] shuttle sheath. A gentle contrast injection demonstrated no evidence of spasms, dissections or intraluminal filling defects at the distal end of the guide catheter in the left common carotid artery. This was then connected to continuous heparinized saline infusion.  A 6 French 115 cm Navien guide catheter was then advanced just distal to the tip of the [place] shuttle sheath in the distal left common carotid artery. The guidewire was removed. Good aspiration was then obtained from the hub of the 6 [place]  Navien guide catheter. A gentle contrast injection revealed no evidence of spasms, dissections or of intraluminal filling defects.  Over a 0.035 inch Roadrunner guidewire, using biplane roadmap technique, the 6 [place] Navien guide catheter was then advanced to the distal cervical left ICA and positioned at the cervical petrous junction. The guidewire was removed. Good aspiration was obtained from the hub of the 6 [place] Navien guide catheter.  At this time in a coaxial manner and with constant heparinized saline infusion using biplane roadmap technique and constant fluoroscopic guidance, over a 0.014 inch Softip Synchro micro guidewire, a Headway 17 2 tip micro catheter which had been steam-shaped was then advanced to the distal end of the 6 [place] Navien guide catheter. The micro guidewire was then gently manipulated using a torque device into the left middle cerebral artery M1 segment followed by the micro catheter. The micro guidewire was then advanced and positioned just proximal to the level of the aneurysm neck at the origin of the inferior division of the left middle cerebral artery. Multiple attempts were then made to advance the micro guidewire with different distal configurations into the inferior divisions. All of these were unsuccessful.  The micro catheter was then advanced to the M2 M3 region of the superior division of the left middle cerebral artery. The guidewire was removed. Good aspiration was obtained from the hub of the Headway micro catheter. This was then connected to continuous heparinized saline infusion.  Through the second port of the Tuohy-Borst at the hub of the 6 [place] Navien guide catheter, an SL 10 2-tip micro catheter with a 45 degree angulation was then advanced over 0.0148 inch Softip Synchro micro guidewire to the distal end of the 6 [place] Navien guide catheter in the left internal carotid artery. With the micro guidewire leading with a J-tip configuration, the combination was  then advanced to the left middle cerebral artery M1 segment. The micro guidewire was then advanced into the aneurysm followed by the SL 10 45 degrees angle  micro catheter. The micro guidewire was advanced within the aneurysm under constant fluoroscopic guidance gingerly such that the tip of the wire was now pointing into the origin of the inferior division of the left middle cerebral artery. This was then followed by the micro catheter. The micro guidewire was then gently manipulated with access obtained into the inferior division. The wire was then advanced distally into the inferior division followed by the SL 10 micro catheter carefully. There was free advancement of the micro catheter to the M3 region of the left middle cerebral artery inferior division. The micro guidewire was then removed. Good aspiration was obtained from the hub of the SL 10 micro catheter. A gentle contrast injection demonstrated antegrade flow distally. This was then connected to continuous heparinized saline infusion.  At this time a 2.5 mm x 34 mm LVIS Jr stent with a working length of 30 mm was then advanced in a coaxial manner and with constant heparinized saline infusion using biplane roadmap technique and constant fluoroscopic guidance to the distal end of the SL 10 micro catheter. The O ring on the delivery micro guidewire and the delivery micro catheter were then gently loosened. With slight forward gentle traction with the right hand on the delivery micro guidewire, with the left hand the SL 10 micro catheter was gently retrieved in a controlled gradual slow fashion unsheathing the LVIS stent.  This was continued until the entire stent was deployed. A control arteriogram performed through the 6 [place] Navien guide catheter demonstrated excellent apposition in the left middle cerebral artery M1 segment and the M2 region of the inferior division of the left middle cerebral artery. However, there was significant narrowing noted in  the stent just distal to its entry into the inferior division.  This was then subsequently dilated with a 0.012 inch J-tipped Headliner micro guidewire which was advanced to the focal narrowing within the stent followed by the micro catheter. The micro guidewire was then gingerly advanced to and fro at the site of the superior stenosis. The wire was then retrieved proximally as was the micro catheter. A control arteriogram performed through the 6 [place] Navien guide catheter demonstrated significantly improved caliber of the stent at this point with excellent flow into the vessels.  Control arteriograms were then performed at 15, 30 and 40 minutes post stent deployment. These continued to demonstrate excellent flow. There was suspicion of a small filling defect along the distal aspect of the LVIS stent. This prompted the use of approximately 4 mg of superselective intracranial Integrelin over about 4 minutes.  Control arteriograms continued to demonstrate excellent flow with opening of the LVIS stent within the aneurysm itself.  A final control arteriogram performed through the 6 [place] Navien guide catheter continued to demonstrate excellent flow without any intraluminal filling defects.  This was then retrieved into the abdominal aorta along with the 6 [place] Cook shuttle sheath. This was then exchanged out for a 6 [place] Pinnacle sheath over a 0.035 inch guidewire. The sheath itself was then removed with the successful application of an external closure device.  The patient's ACT throughout the procedure was maintained in the region of 180 seconds.  No acute neurological or hemodynamic changes were seen throughout the procedure.  The patient's general anesthesia was then reversed and the patient was extubated. Upon recovery the patient demonstrated no new neurological signs or symptoms. Memory and orientation remained stable as did her motor, sensory and coordination function. She was then transferred to the  neuro ICU to continue her IV heparin and to control her blood pressure on IV Cardene within the parameters.  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   Electronically Signed   By: Luanne Bras M.D.   On: 01/21/2015 13:30   Ir Angio Intra Extracran Sel Com Carotid Innominate Uni R Mod Sed  01/22/2015   CLINICAL DATA:  Progressive headaches. History of multiple intracranial aneurysms. 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  EXAM: TRANSCATHETER THERAPY EMBOLIZATION OF LEFT MIDDLE CEREBRAL ARTERY REGION ANEURYSM  ANESTHESIA/SEDATION: General anesthesia.  MEDICATIONS: As per general anesthesia.  CONTRAST:  123mL OMNIPAQUE IOHEXOL 300 MG/ML  SOLN  PROCEDURE: Following a full explanation of the procedure along with the potential associated complications, an informed witnessed consent was obtained.  The right groin was prepped and draped in the usual sterile fashion. Thereafter using modified Seldinger technique, transfemoral access into right common femoral artery was obtained without difficulty. Over a 0.035 inch guidewire, a 5 French Pinnacle sheath was inserted. Through this, and also over a 0.035 inch guidewire, 5 French JB1 catheter was advanced to the aortic arch region and selectively positioned in the right common carotid artery, the right vertebral artery, and the left common carotid artery. The patient tolerated the procedure well. A 3D rotational arteriogram was also performed of the left intracranial circulation from a left-sided common carotid artery injection. Reconstructions were performed on a separate workstation.  COMPLICATIONS: None immediate.  FINDINGS: The right  common carotid arteriogram demonstrates the right external carotid artery and its major branches to be widely patent.  The right internal carotid artery at the bulb and its proximal one-third is patent.  There is a modest kink at the junction of the middle and the one-third of the right internal carotid artery without impedance of any blood flow distally.  More distally the vessel is seen to opacify normally to the cranial skull base.  The petrous, cavernous and supraclinoid segments are widely patent.  The right middle and the right anterior cerebral arteries are seen to opacify normally into the capillary and venous phases.  The right pericallosal artery is seen to cross the midline and supply the distal anterior cerebral artery distribution on the left.  Also seen are the previously positioned clips in the right middle cerebral artery distribution bifurcation. There is a mild fusiform prominence noted proximal to the clips at the inferior division of the right middle cerebral artery.  However, no angiographic change is noted from the previous catheter angiogram.  The vertebral artery origin is normal.  This is a dominant vertebral which it is seen to opacify to the cranial skull base. The previously endovascularly coiled right posterior-inferior cerebellar artery remains completely obliterated without evidence of recanalization or of coil compaction.  The right posterior-inferior cerebellar artery remains widely patent.  The distal basilar artery, the posterior cerebral arteries, the superior cerebellar arteries and the anterior inferior cerebellar arteries opacify normally into the capillary and venous phases.  The left common carotid arteriogram demonstrates the left external carotid artery and its major branches to be normal.  The left internal carotid artery at the bulb to the cranial skull base opacifies normally.  The petrous, the cavernous and the supraclinoid segments are widely patent. The left  middle  cerebral artery and the left anterior cerebral artery are seen to opacify normally into the capillary and venous phases.  Arising in the region of the superior and the inferior divisions of the left middle cerebral artery again seen is a saccular outpouching projecting inferiorly and slightly anteriorly.  A 3D rotational arteriogram performed with reconstruction on a separate workstation confirms the presence of a wide neck lobulated aneurysm measuring approximately 5 mm x 4.5 mm. This is a wide neck which extends mostly into the origin of the inferior division.  ENDOVASCULAR STAGED EMBOLIZATION OF WIDE NECK LOBULATED LEFT MCA BIFURCATION ANEURYSM USING THE LVIS JR STENT.  The angiographic findings were reviewed with the patient and the patient's family. Informed consent was obtained. The patient was then put under general anesthesia by the Department of Anesthesiology at [place] catheter in the left common carotid artery was then exchanged over a 0.035 inch 300 cm Constance Holster exchange guidewire for a 6 French 80 cm Cook shuttle sheath using biplane roadmap technique and constant fluoroscopic guidance. Good aspiration was obtained from the side port of the Tuohy-Borst at the hub of the [place] shuttle sheath. A gentle contrast injection demonstrated no evidence of spasms, dissections or intraluminal filling defects at the distal end of the guide catheter in the left common carotid artery. This was then connected to continuous heparinized saline infusion.  A 6 French 115 cm Navien guide catheter was then advanced just distal to the tip of the [place] shuttle sheath in the distal left common carotid artery. The guidewire was removed. Good aspiration was then obtained from the hub of the 6 [place] Navien guide catheter. A gentle contrast injection revealed no evidence of spasms, dissections or of intraluminal filling defects.  Over a 0.035 inch Roadrunner guidewire, using biplane roadmap  technique, the 6 [place] Navien guide catheter was then advanced to the distal cervical left ICA and positioned at the cervical petrous junction. The guidewire was removed. Good aspiration was obtained from the hub of the 6 [place] Navien guide catheter.  At this time in a coaxial manner and with constant heparinized saline infusion using biplane roadmap technique and constant fluoroscopic guidance, over a 0.014 inch Softip Synchro micro guidewire, a Headway 17 2 tip micro catheter which had been steam-shaped was then advanced to the distal end of the 6 [place] Navien guide catheter. The micro guidewire was then gently manipulated using a torque device into the left middle cerebral artery M1 segment followed by the micro catheter. The micro guidewire was then advanced and positioned just proximal to the level of the aneurysm neck at the origin of the inferior division of the left middle cerebral artery. Multiple attempts were then made to advance the micro guidewire with different distal configurations into the inferior divisions. All of these were unsuccessful.  The micro catheter was then advanced to the M2 M3 region of the superior division of the left middle cerebral artery. The guidewire was removed. Good aspiration was obtained from the hub of the Headway micro catheter. This was then connected to continuous heparinized saline infusion.  Through the second port of the Tuohy-Borst at the hub of the 6 [place] Navien guide catheter, an SL 10 2-tip micro catheter with a 45 degree angulation was then advanced over 0.0148 inch Softip Synchro micro guidewire to the distal end of the 6 [place] Navien guide catheter in the left internal carotid artery. With the micro guidewire leading with a J-tip  configuration, the combination was then advanced to the left middle cerebral artery M1 segment. The micro guidewire was then advanced into the aneurysm followed by the SL 10 45 degrees angle micro catheter. The micro guidewire was  advanced within the aneurysm under constant fluoroscopic guidance gingerly such that the tip of the wire was now pointing into the origin of the inferior division of the left middle cerebral artery. This was then followed by the micro catheter. The micro guidewire was then gently manipulated with access obtained into the inferior division. The wire was then advanced distally into the inferior division followed by the SL 10 micro catheter carefully. There was free advancement of the micro catheter to the M3 region of the left middle cerebral artery inferior division. The micro guidewire was then removed. Good aspiration was obtained from the hub of the SL 10 micro catheter. A gentle contrast injection demonstrated antegrade flow distally. This was then connected to continuous heparinized saline infusion.  At this time a 2.5 mm x 34 mm LVIS Jr stent with a working length of 30 mm was then advanced in a coaxial manner and with constant heparinized saline infusion using biplane roadmap technique and constant fluoroscopic guidance to the distal end of the SL 10 micro catheter. The O ring on the delivery micro guidewire and the delivery micro catheter were then gently loosened. With slight forward gentle traction with the right hand on the delivery micro guidewire, with the left hand the SL 10 micro catheter was gently retrieved in a controlled gradual slow fashion unsheathing the LVIS stent.  This was continued until the entire stent was deployed. A control arteriogram performed through the 6 [place] Navien guide catheter demonstrated excellent apposition in the left middle cerebral artery M1 segment and the M2 region of the inferior division of the left middle cerebral artery. However, there was significant narrowing noted in the stent just distal to its entry into the inferior division.  This was then subsequently dilated with a 0.012 inch J-tipped Headliner micro guidewire which was advanced to the focal narrowing  within the stent followed by the micro catheter. The micro guidewire was then gingerly advanced to and fro at the site of the superior stenosis. The wire was then retrieved proximally as was the micro catheter. A control arteriogram performed through the 6 [place] Navien guide catheter demonstrated significantly improved caliber of the stent at this point with excellent flow into the vessels.  Control arteriograms were then performed at 15, 30 and 40 minutes post stent deployment. These continued to demonstrate excellent flow. There was suspicion of a small filling defect along the distal aspect of the LVIS stent. This prompted the use of approximately 4 mg of superselective intracranial Integrelin over about 4 minutes.  Control arteriograms continued to demonstrate excellent flow with opening of the LVIS stent within the aneurysm itself.  A final control arteriogram performed through the 6 [place] Navien guide catheter continued to demonstrate excellent flow without any intraluminal filling defects.  This was then retrieved into the abdominal aorta along with the [place] shuttle sheath. This was then exchanged out for a 6 [place] Pinnacle sheath over a 0.035 inch guidewire. The sheath itself was then removed with the successful application of an external closure device.  The patient's ACT throughout the procedure was maintained in the region of 180 seconds.  No acute neurological or hemodynamic changes were seen throughout the procedure.  The patient's general anesthesia was then reversed and the patient was  extubated. Upon recovery the patient demonstrated no new neurological signs or symptoms. Memory and orientation remained stable as did her motor, sensory and coordination function. She was then transferred to the neuro ICU to continue her IV heparin and to control her blood pressure on IV Cardene within the parameters.  IMPRESSION: Endovascular staged embolization of wide neck lobulated complex aneurysm of  the left middle cerebral artery bifurcation using the LVIS Jr stent device as described above without event.   Electronically Signed   By: Luanne Bras M.D.   On: 01/21/2015 13:30   Ir Angio Intra Extracran Sel Internal Carotid Uni L Mod Sed  01/22/2015   CLINICAL DATA:  Progressive headaches. History of multiple intracranial aneurysms. 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  EXAM: TRANSCATHETER THERAPY EMBOLIZATION OF LEFT MIDDLE CEREBRAL ARTERY REGION ANEURYSM  ANESTHESIA/SEDATION: General anesthesia.  MEDICATIONS: As per general anesthesia.  CONTRAST:  166mL OMNIPAQUE IOHEXOL 300 MG/ML  SOLN  PROCEDURE: Following a full explanation of the procedure along with the potential associated complications, an informed witnessed consent was obtained.  The right groin was prepped and draped in the usual sterile fashion. Thereafter using modified Seldinger technique, transfemoral access into right common femoral artery was obtained without difficulty. Over a 0.035 inch guidewire, a 5 French Pinnacle sheath was inserted. Through this, and also over a 0.035 inch guidewire, 5 French JB1 catheter was advanced to the aortic arch region and selectively positioned in the right common carotid artery, the right vertebral artery, and the left common carotid artery. The patient tolerated the procedure well. A 3D rotational arteriogram was also performed of the left intracranial circulation from a left-sided common carotid artery injection. Reconstructions were performed on a separate workstation.  COMPLICATIONS: None immediate.  FINDINGS: The right common carotid arteriogram demonstrates the right external carotid artery and its major branches to be widely patent.  The right internal carotid artery at the bulb and its proximal one-third is patent.   There is a modest kink at the junction of the middle and the one-third of the right internal carotid artery without impedance of any blood flow distally.  More distally the vessel is seen to opacify normally to the cranial skull base.  The petrous, cavernous and supraclinoid segments are widely patent.  The right middle and the right anterior cerebral arteries are seen to opacify normally into the capillary and venous phases.  The right pericallosal artery is seen to cross the midline and supply the distal anterior cerebral artery distribution on the left.  Also seen are the previously positioned clips in the right middle cerebral artery distribution bifurcation. There is a mild fusiform prominence noted proximal to the clips at the inferior division of the right middle cerebral artery.  However, no angiographic change is noted from the previous catheter angiogram.  The vertebral artery origin is normal.  This is a dominant vertebral which it is seen to opacify to the cranial skull base. The previously endovascularly coiled right posterior-inferior cerebellar artery remains completely obliterated without evidence of recanalization or of coil compaction.  The right posterior-inferior cerebellar artery remains widely patent.  The distal basilar artery, the posterior cerebral arteries, the superior cerebellar arteries and the anterior inferior cerebellar arteries opacify normally into the capillary and venous phases.  The left common carotid arteriogram demonstrates the left external carotid artery and its major branches to be  normal.  The left internal carotid artery at the bulb to the cranial skull base opacifies normally.  The petrous, the cavernous and the supraclinoid segments are widely patent. The left middle cerebral artery and the left anterior cerebral artery are seen to opacify normally into the capillary and venous phases.  Arising in the region of the superior and the inferior divisions of the left middle  cerebral artery again seen is a saccular outpouching projecting inferiorly and slightly anteriorly.  A 3D rotational arteriogram performed with reconstruction on a separate workstation confirms the presence of a wide neck lobulated aneurysm measuring approximately 5 mm x 4.5 mm. This is a wide neck which extends mostly into the origin of the inferior division.  ENDOVASCULAR STAGED EMBOLIZATION OF WIDE NECK LOBULATED LEFT MCA BIFURCATION ANEURYSM USING THE LVIS JR STENT.  The angiographic findings were reviewed with the patient and the patient's family. Informed consent was obtained. The patient was then put under general anesthesia by the Department of Anesthesiology at [place] catheter in the left common carotid artery was then exchanged over a 0.035 inch 300 cm Constance Holster exchange guidewire for a 6 French 80 cm [place] shuttle sheath using biplane roadmap technique and constant fluoroscopic guidance. Good aspiration was obtained from the side port of the Tuohy-Borst at the hub of the [place] shuttle sheath. A gentle contrast injection demonstrated no evidence of spasms, dissections or intraluminal filling defects at the distal end of the guide catheter in the left common carotid artery. This was then connected to continuous heparinized saline infusion.  A 6 French 115 cm Navien guide catheter was then advanced just distal to the tip of the [place] shuttle sheath in the distal left common carotid artery. The guidewire was removed. Good aspiration was then obtained from the hub of the 6 [place] Navien guide catheter. A gentle contrast injection revealed no evidence of spasms, dissections or of intraluminal filling defects.  Over a 0.035 inch Roadrunner guidewire, using biplane roadmap technique, the 6 [place] Navien guide catheter was then advanced to the distal cervical left ICA and positioned at the cervical petrous junction. The guidewire was removed. Good aspiration was obtained from  the hub of the 6 [place] Navien guide catheter.  At this time in a coaxial manner and with constant heparinized saline infusion using biplane roadmap technique and constant fluoroscopic guidance, over a 0.014 inch Softip Synchro micro guidewire, a Headway 17 2 tip micro catheter which had been steam-shaped was then advanced to the distal end of the 6 [place] Navien guide catheter. The micro guidewire was then gently manipulated using a torque device into the left middle cerebral artery M1 segment followed by the micro catheter. The micro guidewire was then advanced and positioned just proximal to the level of the aneurysm neck at the origin of the inferior division of the left middle cerebral artery. Multiple attempts were then made to advance the micro guidewire with different distal configurations into the inferior divisions. All of these were unsuccessful.  The micro catheter was then advanced to the M2 M3 region of the superior division of the left middle cerebral artery. The guidewire was removed. Good aspiration was obtained from the hub of the Headway micro catheter. This was then connected to continuous heparinized saline infusion.  Through the second port of the Tuohy-Borst at the hub of the 6 [place] Navien guide catheter, an SL 10 2-tip micro catheter with a 45 degree angulation was then advanced over  0.0148 inch Softip Synchro micro guidewire to the distal end of the 6 [place] Navien guide catheter in the left internal carotid artery. With the micro guidewire leading with a J-tip configuration, the combination was then advanced to the left middle cerebral artery M1 segment. The micro guidewire was then advanced into the aneurysm followed by the SL 10 45 degrees angle micro catheter. The micro guidewire was advanced within the aneurysm under constant fluoroscopic guidance gingerly such that the tip of the wire was now pointing into the origin of the inferior division of the left middle cerebral artery. This  was then followed by the micro catheter. The micro guidewire was then gently manipulated with access obtained into the inferior division. The wire was then advanced distally into the inferior division followed by the SL 10 micro catheter carefully. There was free advancement of the micro catheter to the M3 region of the left middle cerebral artery inferior division. The micro guidewire was then removed. Good aspiration was obtained from the hub of the SL 10 micro catheter. A gentle contrast injection demonstrated antegrade flow distally. This was then connected to continuous heparinized saline infusion.  At this time a 2.5 mm x 34 mm LVIS Jr stent with a working length of 30 mm was then advanced in a coaxial manner and with constant heparinized saline infusion using biplane roadmap technique and constant fluoroscopic guidance to the distal end of the SL 10 micro catheter. The O ring on the delivery micro guidewire and the delivery micro catheter were then gently loosened. With slight forward gentle traction with the right hand on the delivery micro guidewire, with the left hand the SL 10 micro catheter was gently retrieved in a controlled gradual slow fashion unsheathing the LVIS stent.  This was continued until the entire stent was deployed. A control arteriogram performed through the 6 [place] Navien guide catheter demonstrated excellent apposition in the left middle cerebral artery M1 segment and the M2 region of the inferior division of the left middle cerebral artery. However, there was significant narrowing noted in the stent just distal to its entry into the inferior division.  This was then subsequently dilated with a 0.012 inch J-tipped Headliner micro guidewire which was advanced to the focal narrowing within the stent followed by the micro catheter. The micro guidewire was then gingerly advanced to and fro at the site of the superior stenosis. The wire was then retrieved proximally as was the micro  catheter. A control arteriogram performed through the 6 [place] Navien guide catheter demonstrated significantly improved caliber of the stent at this point with excellent flow into the vessels.  Control arteriograms were then performed at 15, 30 and 40 minutes post stent deployment. These continued to demonstrate excellent flow. There was suspicion of a small filling defect along the distal aspect of the LVIS stent. This prompted the use of approximately 4 mg of superselective intracranial Integrelin over about 4 minutes.  Control arteriograms continued to demonstrate excellent flow with opening of the LVIS stent within the aneurysm itself.  A final control arteriogram performed through the 6 [place] Navien guide catheter continued to demonstrate excellent flow without any intraluminal filling defects.  This was then retrieved into the abdominal aorta along with the [place] shuttle sheath. This was then exchanged out for a 6 [place] Pinnacle sheath over a 0.035 inch guidewire. The sheath itself was then removed with the successful application of an external closure device.  The patient's ACT throughout the procedure was  maintained in the region of 180 seconds.  No acute neurological or hemodynamic changes were seen throughout the procedure.  The patient's general anesthesia was then reversed and the patient was extubated. Upon recovery the patient demonstrated no new neurological signs or symptoms. Memory and orientation remained stable as did her motor, sensory and coordination function. She was then transferred to the neuro ICU to continue her IV heparin and to control her blood pressure on IV Cardene within the parameters.  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   Electronically Signed   By: Luanne Bras M.D.   On: 01/21/2015 13:30   Ir Angio  Vertebral Sel Vertebral Uni R Mod Sed  01/22/2015   CLINICAL DATA:  Progressive headaches. History of multiple intracranial aneurysms. 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  EXAM: TRANSCATHETER THERAPY EMBOLIZATION OF LEFT MIDDLE CEREBRAL ARTERY REGION ANEURYSM  ANESTHESIA/SEDATION: General anesthesia.  MEDICATIONS: As per general anesthesia.  CONTRAST:  165mL OMNIPAQUE IOHEXOL 300 MG/ML  SOLN  PROCEDURE: Following a full explanation of the procedure along with the potential associated complications, an informed witnessed consent was obtained.  The right groin was prepped and draped in the usual sterile fashion. Thereafter using modified Seldinger technique, transfemoral access into right common femoral artery was obtained without difficulty. Over a 0.035 inch guidewire, a 5 French Pinnacle sheath was inserted. Through this, and also over a 0.035 inch guidewire, 5 French JB1 catheter was advanced to the aortic arch region and selectively positioned in the right common carotid artery, the right vertebral artery, and the left common carotid artery. The patient tolerated the procedure well. A 3D rotational arteriogram was also performed of the left intracranial circulation from a left-sided common carotid artery injection. Reconstructions were performed on a separate workstation.  COMPLICATIONS: None immediate.  FINDINGS: The right common carotid arteriogram demonstrates the right external carotid artery and its major branches to be widely patent.  The right internal carotid artery at the bulb and its proximal one-third is patent.  There is a modest kink at the junction of the middle and the one-third of the right internal carotid artery without impedance of any blood flow distally.  More distally the vessel is seen to opacify normally to  the cranial skull base.  The petrous, cavernous and supraclinoid segments are widely patent.  The right middle and the right anterior cerebral arteries are seen to opacify normally into the capillary and venous phases.  The right pericallosal artery is seen to cross the midline and supply the distal anterior cerebral artery distribution on the left.  Also seen are the previously positioned clips in the right middle cerebral artery distribution bifurcation. There is a mild fusiform prominence noted proximal to the clips at the inferior division of the right middle cerebral artery.  However, no angiographic change is noted from the previous catheter angiogram.  The vertebral artery origin is normal.  This is a dominant vertebral which it is seen to opacify to the cranial skull base. The previously endovascularly coiled right posterior-inferior cerebellar artery remains completely obliterated without evidence of recanalization or of coil compaction.  The right posterior-inferior cerebellar artery remains widely patent.  The distal basilar artery, the posterior cerebral arteries, the superior cerebellar arteries and the anterior  inferior cerebellar arteries opacify normally into the capillary and venous phases.  The left common carotid arteriogram demonstrates the left external carotid artery and its major branches to be normal.  The left internal carotid artery at the bulb to the cranial skull base opacifies normally.  The petrous, the cavernous and the supraclinoid segments are widely patent. The left middle cerebral artery and the left anterior cerebral artery are seen to opacify normally into the capillary and venous phases.  Arising in the region of the superior and the inferior divisions of the left middle cerebral artery again seen is a saccular outpouching projecting inferiorly and slightly anteriorly.  A 3D rotational arteriogram performed with reconstruction on a separate workstation confirms the presence of a  wide neck lobulated aneurysm measuring approximately 5 mm x 4.5 mm. This is a wide neck which extends mostly into the origin of the inferior division.  ENDOVASCULAR STAGED EMBOLIZATION OF WIDE NECK LOBULATED LEFT MCA BIFURCATION ANEURYSM USING THE LVIS JR STENT.  The angiographic findings were reviewed with the patient and the patient's family. Informed consent was obtained. The patient was then put under general anesthesia by the Department of Anesthesiology at [facility] catheter in the left common carotid artery was then exchanged over a 0.035 inch 300 cm Constance Holster exchange guidewire for a 6 French 80 cm Cook shuttle sheath using biplane roadmap technique and constant fluoroscopic guidance. Good aspiration was obtained from the side port of the Tuohy-Borst at the hub of the [place] shuttle sheath. A gentle contrast injection demonstrated no evidence of spasms, dissections or intraluminal filling defects at the distal end of the guide catheter in the left common carotid artery. This was then connected to continuous heparinized saline infusion.  A 6 French 115 cm Navien guide catheter was then advanced just distal to the tip of the Strong shuttle sheath in the distal left common carotid artery. The guidewire was removed. Good aspiration was then obtained from the hub of the 6 [place] Navien guide catheter. A gentle contrast injection revealed no evidence of spasms, dissections or of intraluminal filling defects.  Over a 0.035 inch Roadrunner guidewire, using biplane roadmap technique, the 6 [place] Navien guide catheter was then advanced to the distal cervical left ICA and positioned at the cervical petrous junction. The guidewire was removed. Good aspiration was obtained from the hub of the 6 [place] Navien guide catheter.  At this time in a coaxial manner and with constant heparinized saline infusion using biplane roadmap technique and constant fluoroscopic guidance, over a 0.014 inch  Softip Synchro micro guidewire, a Headway 17 2 tip micro catheter which had been steam-shaped was then advanced to the distal end of the 6 [place] Navien guide catheter. The micro guidewire was then gently manipulated using a torque device into the left middle cerebral artery M1 segment followed by the micro catheter. The micro guidewire was then advanced and positioned just proximal to the level of the aneurysm neck at the origin of the inferior division of the left middle cerebral artery. Multiple attempts were then made to advance the micro guidewire with different distal configurations into the inferior divisions. All of these were unsuccessful.  The micro catheter was then advanced to the M2 M3 region of the superior division of the left middle cerebral artery. The guidewire was removed. Good aspiration was obtained from the hub of the Headway micro catheter. This was then connected to continuous heparinized saline infusion.  Through the second  port of the Tuohy-Borst at the hub of the 6 [place] Navien guide catheter, an SL 10 2-tip micro catheter with a 45 degree angulation was then advanced over 0.0148 inch Softip Synchro micro guidewire to the distal end of the 6 [place] Navien guide catheter in the left internal carotid artery. With the micro guidewire leading with a J-tip configuration, the combination was then advanced to the left middle cerebral artery M1 segment. The micro guidewire was then advanced into the aneurysm followed by the SL 10 45 degrees angle micro catheter. The micro guidewire was advanced within the aneurysm under constant fluoroscopic guidance gingerly such that the tip of the wire was now pointing into the origin of the inferior division of the left middle cerebral artery. This was then followed by the micro catheter. The micro guidewire was then gently manipulated with access obtained into the inferior division. The wire was then advanced distally into the inferior division followed by  the SL 10 micro catheter carefully. There was free advancement of the micro catheter to the M3 region of the left middle cerebral artery inferior division. The micro guidewire was then removed. Good aspiration was obtained from the hub of the SL 10 micro catheter. A gentle contrast injection demonstrated antegrade flow distally. This was then connected to continuous heparinized saline infusion.  At this time a 2.5 mm x 34 mm LVIS Jr stent with a working length of 30 mm was then advanced in a coaxial manner and with constant heparinized saline infusion using biplane roadmap technique and constant fluoroscopic guidance to the distal end of the SL 10 micro catheter. The O ring on the delivery micro guidewire and the delivery micro catheter were then gently loosened. With slight forward gentle traction with the right hand on the delivery micro guidewire, with the left hand the SL 10 micro catheter was gently retrieved in a controlled gradual slow fashion unsheathing the LVIS stent.  This was continued until the entire stent was deployed. A control arteriogram performed through the 6 [place] Navien guide catheter demonstrated excellent apposition in the left middle cerebral artery M1 segment and the M2 region of the inferior division of the left middle cerebral artery. However, there was significant narrowing noted in the stent just distal to its entry into the inferior division.  This was then subsequently dilated with a 0.012 inch J-tipped Headliner micro guidewire which was advanced to the focal narrowing within the stent followed by the micro catheter. The micro guidewire was then gingerly advanced to and fro at the site of the superior stenosis. The wire was then retrieved proximally as was the micro catheter. A control arteriogram performed through the 6 [place] Navien guide catheter demonstrated significantly improved caliber of the stent at this point with excellent flow into the vessels.  Control arteriograms were  then performed at 15, 30 and 40 minutes post stent deployment. These continued to demonstrate excellent flow. There was suspicion of a small filling defect along the distal aspect of the LVIS stent. This prompted the use of approximately 4 mg of superselective intracranial Integrelin over about 4 minutes.  Control arteriograms continued to demonstrate excellent flow with opening of the LVIS stent within the aneurysm itself.  A final control arteriogram performed through the 6 [place] Navien guide catheter continued to demonstrate excellent flow without any intraluminal filling defects.  This was then retrieved into the abdominal aorta along with the [place] shuttle sheath. This was then exchanged out for a [place]  sheath over a 0.035 inch guidewire. The sheath itself was then removed with the successful application of an external closure device.  The patient's ACT throughout the procedure was maintained in the region of 180 seconds.  No acute neurological or hemodynamic changes were seen throughout the procedure.  The patient's general anesthesia was then reversed and the patient was extubated. Upon recovery the patient demonstrated no new neurological signs or symptoms. Memory and orientation remained stable as did her motor, sensory and coordination function. She was then transferred to the neuro ICU to continue her IV heparin and to control her blood pressure on IV Cardene within the parameters.  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   Electronically Signed   By: Luanne Bras M.D.   On: 01/21/2015 13:30   Ir Neuro Each Add'l After Basic Uni Left (ms)  01/22/2015   CLINICAL DATA:  Progressive headaches. History of multiple intracranial aneurysms. 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  EXAM: TRANSCATHETER THERAPY EMBOLIZATION OF LEFT MIDDLE CEREBRAL ARTERY REGION ANEURYSM  ANESTHESIA/SEDATION: General anesthesia.  MEDICATIONS: As per general anesthesia.  CONTRAST:  15mL OMNIPAQUE IOHEXOL 300 MG/ML  SOLN  PROCEDURE: Following a full explanation of the procedure along with the potential associated complications, an informed witnessed consent was obtained.  The right groin was prepped and draped in the usual sterile fashion. Thereafter using modified Seldinger technique, transfemoral access into right common femoral artery was obtained without difficulty. Over a 0.035 inch guidewire, a 5 French Pinnacle sheath was inserted. Through this, and also over a 0.035 inch guidewire, 5 French JB1 catheter was advanced to the aortic arch region and selectively positioned in the right common carotid artery, the right vertebral artery, and the left common carotid artery. The patient tolerated the procedure well. A 3D rotational arteriogram was also performed of the left intracranial circulation from a left-sided common carotid artery injection. Reconstructions were performed on a separate workstation.  COMPLICATIONS: None immediate.  FINDINGS: The right common carotid arteriogram demonstrates the right external carotid artery and its major branches to be widely patent.  The right internal carotid artery at the bulb and its proximal one-third is patent.  There is a modest kink at the junction of the middle and the one-third of the right internal carotid artery without impedance of any blood flow distally.  More distally the vessel is seen to opacify normally to the cranial skull base.  The petrous, cavernous and supraclinoid segments are widely patent.  The right middle and the right anterior cerebral arteries are seen to opacify normally into the capillary and venous  phases.  The right pericallosal artery is seen to cross the midline and supply the distal anterior cerebral artery distribution on the left.  Also seen are the previously positioned clips in the right middle cerebral artery distribution bifurcation. There is a mild fusiform prominence noted proximal to the clips at the inferior division of the right middle cerebral artery.  However, no angiographic change is noted from the previous catheter angiogram.  The vertebral artery origin is normal.  This is a dominant vertebral which it is seen to opacify to the cranial skull base. The previously endovascularly coiled right posterior-inferior cerebellar artery remains completely obliterated without evidence of recanalization  or of coil compaction.  The right posterior-inferior cerebellar artery remains widely patent.  The distal basilar artery, the posterior cerebral arteries, the superior cerebellar arteries and the anterior inferior cerebellar arteries opacify normally into the capillary and venous phases.  The left common carotid arteriogram demonstrates the left external carotid artery and its major branches to be normal.  The left internal carotid artery at the bulb to the cranial skull base opacifies normally.  The petrous, the cavernous and the supraclinoid segments are widely patent. The left middle cerebral artery and the left anterior cerebral artery are seen to opacify normally into the capillary and venous phases.  Arising in the region of the superior and the inferior divisions of the left middle cerebral artery again seen is a saccular outpouching projecting inferiorly and slightly anteriorly.  A 3D rotational arteriogram performed with reconstruction on a separate workstation confirms the presence of a wide neck lobulated aneurysm measuring approximately 5 mm x 4.5 mm. This is a wide neck which extends mostly into the origin of the inferior division.  ENDOVASCULAR STAGED EMBOLIZATION OF WIDE NECK LOBULATED  LEFT MCA BIFURCATION ANEURYSM USING THE LVIS JR STENT.  The angiographic findings were reviewed with the patient and the patient's family. Informed consent was obtained. The patient was then put under general anesthesia by the Department of Anesthesiology at [employer] catheter in the left common carotid artery was then exchanged over a 0.035 inch 300 cm Constance Holster exchange guidewire for a 6 French 80 cm Cook shuttle sheath using biplane roadmap technique and constant fluoroscopic guidance. Good aspiration was obtained from the side port of the Tuohy-Borst at the hub of the [place] shuttle sheath. A gentle contrast injection demonstrated no evidence of spasms, dissections or intraluminal filling defects at the distal end of the guide catheter in the left common carotid artery. This was then connected to continuous heparinized saline infusion.  A 6 French 115 cm Navien guide catheter was then advanced just distal to the tip of the [place] shuttle sheath in the distal left common carotid artery. The guidewire was removed. Good aspiration was then obtained from the hub of the 6 [place] Navien guide catheter. A gentle contrast injection revealed no evidence of spasms, dissections or of intraluminal filling defects.  Over a 0.035 inch Roadrunner guidewire, using biplane roadmap technique, the 6 [place] Navien guide catheter was then advanced to the distal cervical left ICA and positioned at the cervical petrous junction. The guidewire was removed. Good aspiration was obtained from the hub of the 6 [place] Navien guide catheter.  At this time in a coaxial manner and with constant heparinized saline infusion using biplane roadmap technique and constant fluoroscopic guidance, over a 0.014 inch Softip Synchro micro guidewire, a Headway 17 2 tip micro catheter which had been steam-shaped was then advanced to the distal end of the 6 [place] Navien guide catheter. The micro guidewire was then gently  manipulated using a torque device into the left middle cerebral artery M1 segment followed by the micro catheter. The micro guidewire was then advanced and positioned just proximal to the level of the aneurysm neck at the origin of the inferior division of the left middle cerebral artery. Multiple attempts were then made to advance the micro guidewire with different distal configurations into the inferior divisions. All of these were unsuccessful.  The micro catheter was then advanced to the M2 M3 region of the superior division of the left middle cerebral artery.  The guidewire was removed. Good aspiration was obtained from the hub of the Headway micro catheter. This was then connected to continuous heparinized saline infusion.  Through the second port of the Tuohy-Borst at the hub of the 6 [place] Navien guide catheter, an SL 10 2-tip micro catheter with a 45 degree angulation was then advanced over 0.0148 inch Softip Synchro micro guidewire to the distal end of the 6 [place] Navien guide catheter in the left internal carotid artery. With the micro guidewire leading with a J-tip configuration, the combination was then advanced to the left middle cerebral artery M1 segment. The micro guidewire was then advanced into the aneurysm followed by the SL 10 45 degrees angle micro catheter. The micro guidewire was advanced within the aneurysm under constant fluoroscopic guidance gingerly such that the tip of the wire was now pointing into the origin of the inferior division of the left middle cerebral artery. This was then followed by the micro catheter. The micro guidewire was then gently manipulated with access obtained into the inferior division. The wire was then advanced distally into the inferior division followed by the SL 10 micro catheter carefully. There was free advancement of the micro catheter to the M3 region of the left middle cerebral artery inferior division. The micro guidewire was then removed. Good  aspiration was obtained from the hub of the SL 10 micro catheter. A gentle contrast injection demonstrated antegrade flow distally. This was then connected to continuous heparinized saline infusion.  At this time a 2.5 mm x 34 mm LVIS Jr stent with a working length of 30 mm was then advanced in a coaxial manner and with constant heparinized saline infusion using biplane roadmap technique and constant fluoroscopic guidance to the distal end of the SL 10 micro catheter. The O ring on the delivery micro guidewire and the delivery micro catheter were then gently loosened. With slight forward gentle traction with the right hand on the delivery micro guidewire, with the left hand the SL 10 micro catheter was gently retrieved in a controlled gradual slow fashion unsheathing the LVIS stent.  This was continued until the entire stent was deployed. A control arteriogram performed through the 6 [place] Navien guide catheter demonstrated excellent apposition in the left middle cerebral artery M1 segment and the M2 region of the inferior division of the left middle cerebral artery. However, there was significant narrowing noted in the stent just distal to its entry into the inferior division.  This was then subsequently dilated with a 0.012 inch J-tipped Headliner micro guidewire which was advanced to the focal narrowing within the stent followed by the micro catheter. The micro guidewire was then gingerly advanced to and fro at the site of the superior stenosis. The wire was then retrieved proximally as was the micro catheter. A control arteriogram performed through the 6 [place] Navien guide catheter demonstrated significantly improved caliber of the stent at this point with excellent flow into the vessels.  Control arteriograms were then performed at 15, 30 and 40 minutes post stent deployment. These continued to demonstrate excellent flow. There was suspicion of a small filling defect along the distal aspect of the LVIS stent.  This prompted the use of approximately 4 mg of superselective intracranial Integrelin over about 4 minutes.  Control arteriograms continued to demonstrate excellent flow with opening of the LVIS stent within the aneurysm itself.  A final control arteriogram performed through the 6 [place] Navien guide catheter continued to demonstrate excellent flow without any intraluminal  filling defects.  This was then retrieved into the abdominal aorta along with the [place] shuttle sheath. This was then exchanged out for a 6 [place] Pinnacle sheath over a 0.035 inch guidewire. The sheath itself was then removed with the successful application of an external closure device.  The patient's ACT throughout the procedure was maintained in the region of 180 seconds.  No acute neurological or hemodynamic changes were seen throughout the procedure.  The patient's general anesthesia was then reversed and the patient was extubated. Upon recovery the patient demonstrated no new neurological signs or symptoms. Memory and orientation remained stable as did her motor, sensory and coordination function. She was then transferred to the neuro ICU to continue her IV heparin and to control her blood pressure on IV Cardene within the parameters.  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   Electronically Signed   By: Luanne Bras M.D.   On: 01/21/2015 13:30       Medical Problem List and Plan: 1. 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  DVT Prophylaxis/Anticoagulation: SQ Lovenox.monitor for any bleeding episodes 3. Pain Management: tylenol as needed 4. Mood/depression/anxiety: Risperdal 0.5 mg QHS/Desyrel 150 mg QHS,Zoloft 100 mg daily,Xanax 1 mg TID as needed. Team to provide ego-support as well. 5.  Neuropsych: This patient is capable of making decisions on her own behalf. 6. Skin/Wound Care: Routine skin checks 7. Fluids/Electrolytes/Nutrition: Strict I & O.Follow up labs 8.Hyperlipidemia.Lipitor/Lovaza     Post Admission Physician Evaluation: 1. Functional deficits secondary  to right brain infarct after elective left MCA embolization. 2. Patient is admitted to receive collaborative, interdisciplinary care between the physiatrist, rehab nursing staff, and therapy team. 3. Patient's level of medical complexity and substantial therapy needs in context of that medical necessity cannot be provided at a lesser intensity of care such as a SNF. 4. Patient has experienced substantial functional loss from his/her baseline which was documented above under the "Functional History" and "Functional Status" headings.  Judging by the patient's diagnosis, physical exam, and functional history, the patient has potential for functional progress which will result in measurable gains while on inpatient rehab.  These gains will be of substantial and practical use upon discharge  in facilitating mobility and self-care at the household level. 5. Physiatrist will provide 24 hour management of medical needs as well as oversight of the therapy plan/treatment and provide guidance as appropriate regarding the interaction of the two. 6. 24 hour rehab nursing will assist with bladder management, bowel management, safety, skin/wound care, disease management, medication administration, pain management and patient education  and help integrate therapy concepts, techniques,education, etc. 7. PT will assess and treat for/with: Lower extremity strength, range of motion, stamina, balance, functional mobility, safety, adaptive techniques and equipment, NMR, visual perceptual awareness, proprioception, stroke education, family education.   Goals are: mod I. 8. OT will assess and treat for/with: ADL's, functional mobility, safety,  upper extremity strength, adaptive techniques and equipment, NMR, visual perceptual awareness, stroke education, ego support, .   Goals are: mod I to supervision. Therapy may proceed with showering this patient.   9. SLP will assess and treat for/with: speech, swallowing, communication.  Goals are: mod I to supervision. 10. Case Management and Social Worker will assess and treat for psychological issues and discharge planning. 11. Team conference will be  held weekly to assess progress toward goals and to determine barriers to discharge. 12. Patient will receive at least 3 hours of therapy per day at least 5 days per week. 13. ELOS: 16-20  days       14. Prognosis:  excellent     Meredith Staggers, MD, [facility] 01/22/2015   01/22/2015

## 2015-01-22 NOTE — Interval H&P Note (Signed)
Diane Hutchinson was admitted today to Inpatient Rehabilitation with the diagnosis of embolic right brain infarct.  The patient's history has been reviewed, patient examined, and there is no change in status.  Patient continues to be appropriate for intensive inpatient rehabilitation.  I have reviewed the patient's chart and labs.  Questions were answered to the patient's satisfaction.  SWARTZ,ZACHARY T 01/22/2015, 8:11 PM

## 2015-01-22 NOTE — Progress Notes (Signed)
Physical Therapy Treatment Patient Details Name: Diane Hutchinson MRN: [id number] DOB: 1946-01-29 Today's Date: 01/22/2015    History of Present Illness pt presents post L MCA Aneurysm revascularization.      PT Comments    Pt seems to have less anxiety with mobility today, though indicates having difficulty mobilizing overnight with nsg.  Continue to feel pt would benefit from CIR at D/C to maximize independence.  Will continue to follow.    Follow Up Recommendations  CIR     Equipment Recommendations  None recommended by PT    Recommendations for Other Services       Precautions / Restrictions Precautions Precautions: Fall Restrictions Weight Bearing Restrictions: No    Mobility  Bed Mobility Overal bed mobility: Needs Assistance Bed Mobility: Supine to Sit     Supine to sit: Mod assist     General bed mobility comments: cues for step-by-step sequencing, attending to L side, and encouragement.  pt tends to neglect L UE > LE.    Transfers Overall transfer level: Needs assistance Equipment used: 2 person hand held assist Transfers: Sit to/from [facility] Sit to Stand: Mod assist Stand pivot transfers: Max assist;+2 physical assistance       General transfer comment: pt able to come to stand with one person A and use of R UE on armrest of recliner.  pt with por awareness of L side and poor proprioception as pt's L foot tends to supinate.  cues and facilitation for trunk/hip extension into standing and to complete pivot.  Repeated transfer bed to 3-in-1 to recliner.    Ambulation/Gait                 Stairs            Wheelchair Mobility    Modified Rankin (Stroke Patients Only) Modified Rankin (Stroke Patients Only) Pre-Morbid Rankin Score: No symptoms Modified Rankin: Severe disability     Balance Overall balance assessment: Needs assistance Sitting-balance support: Single extremity supported;Feet supported Sitting  balance-Leahy Scale: Fair     Standing balance support: During functional activity Standing balance-Leahy Scale: Poor                      Cognition Arousal/Alertness: Awake/alert Behavior During Therapy: WFL for tasks assessed/performed Overall Cognitive Status: Within Functional Limits for tasks assessed                      Exercises      General Comments        Pertinent Vitals/Pain Pain Assessment: No/denies pain    Home Living     Available Help at Discharge: Family;Available 24 hours/day Type of Home: Mobile home Home Access: Ramped entrance;Other (comment) (ramp in the back that pt's father used before he died)   Home Layout: One level        Prior Function            PT Goals (current goals can now be found in the care plan section) Acute Rehab PT Goals Patient Stated Goal: Back to normal. PT Goal Formulation: With patient Time For Goal Achievement: 02/04/15 Potential to Achieve Goals: Good Progress towards PT goals: Progressing toward goals    Frequency  Min 4X/week    PT Plan Current plan remains appropriate    Co-evaluation             End of Session Equipment Utilized During Treatment: Gait belt Activity Tolerance: Patient tolerated treatment  well Patient left: in chair;with call bell/phone within reach     Time: 0906-0933 PT Time Calculation (min) (ACUTE ONLY): 27 min  Charges:  $Therapeutic Activity: 23-37 mins                    G CodesCatarina Hartshorn, [place] [phone] 01/22/2015, 3:41 PM

## 2015-01-22 NOTE — PMR Pre-admission (Signed)
PMR Admission Coordinator Pre-Admission Assessment  Patient: Diane Hutchinson is an 69 y.o., female MRN: [id number] DOB: 07-31-1946 Height: 5\' 7"  (170.2 cm) Weight: 80.74 kg (178 lb)              Insurance Information HMO:     PPO:      PCP:      IPA:      80/20: yes     OTHER: no HMO PRIMARY: Medicare a and b      Policy#: [id number]      Subscriber: pt Benefits:  Phone #: [employer] online     Name: 01/22/2015 Eff. Date: 06/13/96     Deduct: $1288      Out of Pocket Max: none      Life Max: none CIR: 100%      SNF: 20 full days Outpatient: 80%     Co-Pay: 20% Home Health: 100%      Co-Pay: none DME: 80%     Co-Pay: 20% Providers: pt choice  SECONDARY: [employer]      Policy#: [id number]      Subscriber: pt No auth required  Medicaid Application Date:       Case Manager:  Disability Application Date:       Case Worker:   Emergency Contact Information Contact Information    Name Relation Home Work [place] Spouse [phone]  [phone]   Loleta Dicker Daughter [phone]  [phone]     Current Medical History  Patient Admitting Diagnosis: embolic right brain infarct after embolization procedure  History of Present Illness:Diane Hutchinson is a 68 y.o. right handed female with history of hypertension, migraine headaches as well as multiple intracranial aneurysms with right MCA aneurysmal clipping 1993 as well as coiling 2010 without residual weakness.. Independent/driving prior to admission living with her husband.   Presented 01/20/2015 with decreased balance as well as headache. She had been seen by interventional radiology in the past with plan for image guided cerebral arteriogram for findings a left MCA aneurysm. Underwent left MCA elective embolization using stent assistance 01/20/2015 per Intervention radiology. Patient was extubated noted left-sided tingling and weakness. Code stroke was called. CT of the head showed no acute stroke. CTA  was then performed again showing no large vessel occlusion. She was not a TPA candidate. Neurology consulted suspect non-dominant right brain infarct, embolic secondary to complication of interventional neuroradiology aneurysm embolization. Echocardiogram is pending. She currently is maintained on aspirin and Plavix therapy. Subcutaneous Lovenox added for DVT prophylaxis. Bouts of restlessness and agitation her Risperdal and Desyrel as prior to admission was resumed.   Total: 8 NIH    Past Medical History  Past Medical History  Diagnosis Date  . Migraine   . Depression   . Hypertension   . Stroke   . COPD (chronic obstructive pulmonary disease)   . Anxiety     h/o of panic attack  . GERD (gastroesophageal reflux disease)     occas. use of TUMS  . Arthritis     knees    Family History  family history is negative for Stroke.  Prior Rehab/Hospitalizations: none  Current Medications   Current facility-administered medications:  .  0.9 %  sodium chloride infusion, , Intravenous, Continuous, Luanne Bras, MD, Last Rate: 75 mL/hr at 01/22/15 1100 .  0.9 %  sodium chloride infusion, , Intravenous, Continuous, Amie Portland, MD .  acetaminophen (TYLENOL) tablet 1,000 mg, 1,000 mg, Oral, Q6H PRN, 1,000 mg at 01/21/15 0335 **  OR** acetaminophen (TYLENOL) suppository 650 mg, 650 mg, Rectal, Q6H PRN, Luanne Bras, MD .  ALPRAZolam Duanne Moron) tablet 1 mg, 1 mg, Oral, TID PRN, Wallie Char, 1 mg at 01/22/15 0944 .  aspirin tablet 325 mg, 325 mg, Oral, Q breakfast, Luanne Bras, MD, 325 mg at 01/22/15 0832 .  atorvastatin (LIPITOR) tablet 40 mg, 40 mg, Oral, q1800, Donzetta Starch, NP, 40 mg at 01/21/15 1806 .  clopidogrel (PLAVIX) tablet 75 mg, 75 mg, Oral, Q breakfast, Luanne Bras, MD, 75 mg at 01/22/15 0832 .  enoxaparin (LOVENOX) injection 40 mg, 40 mg, Subcutaneous, Q24H, Garvin Fila, MD, 40 mg at 01/22/15 0944 .  nicardipine (CARDENE) 20mg  in 0.86% saline 250ml IV  infusion (0.1 mg/ml), 5-15 mg/hr, Intravenous, Continuous, Sanjeev Deveshwar, MD .  omega-3 acid ethyl esters (LOVAZA) capsule 1 g, 1 g, Oral, Daily, Donzetta Starch, NP, 1 g at 01/22/15 0944 .  ondansetron (ZOFRAN) injection 4 mg, 4 mg, Intravenous, Q6H PRN, Luanne Bras, MD .  phenol (CHLORASEPTIC) mouth spray 1 spray, 1 spray, Mouth/Throat, PRN, Luanne Bras, MD .  risperiDONE (RISPERDAL) tablet 0.5 mg, 0.5 mg, Oral, QHS, Wallie Char, 0.5 mg at 01/21/15 2351 .  sertraline (ZOLOFT) tablet 100 mg, 100 mg, Oral, QHS, Wallie Char, 100 mg at 01/21/15 2328 .  traZODone (DESYREL) tablet 150 mg, 150 mg, Oral, QHS, Wallie Char, 150 mg at 01/21/15 2351  Patients Current Diet: Diet regular with thin liquids  Precautions / Restrictions Precautions Precautions: Fall Restrictions Weight Bearing Restrictions: No   Prior Activity Level Community (5-7x/wk): Independent and driving pta with no deficits   Development worker, international aid / Equipment Home Assistive Devices/Equipment: None  Prior Functional Level Prior Function Level of Independence: Independent  Current Functional Level Cognition  Overall Cognitive Status: Within Functional Limits for tasks assessed Orientation Level: Oriented X4    Extremity Assessment (includes Sensation/Coordination)  Upper Extremity Assessment: Defer to OT evaluation  Lower Extremity Assessment: LLE deficits/detail LLE Deficits / Details: Strength grossly 4-/5, decreased coordination, ataxic, and decresed sensation to soft touch and proprioception.  Of note pt is hypersensitive to painful stimuli.   LLE Sensation: decreased light touch, decreased proprioception LLE Coordination: decreased fine motor    ADLs       Mobility  Overal bed mobility: Needs Assistance Bed Mobility: Supine to Sit Supine to sit: Mod assist General bed mobility comments: cues for step-by-step sequencing, attending to L side, and encouragement.  pt tends to neglect L  UE > LE.      Transfers  Overall transfer level: Needs assistance Equipment used: 2 person hand held assist Transfers: Sit to/from Stand, Stand Pivot Transfers Sit to Stand: Mod assist Stand pivot transfers: Max assist, +2 physical assistance General transfer comment: pt able to come to stand with one person A and use of R UE on armrest of recliner.  pt with por awareness of L side and poor proprioception as pt's L foot tends to supinate.  cues and facilitation for trunk/hip extension into standing and to complete pivot.      Ambulation / Gait / Stairs / Office manager / Balance Dynamic Sitting Balance Sitting balance - Comments: pt able to maintain balance without UE support when focused on task of sitting, however when distracted pt needs single UE support or MinA to maintain sitting balance.   Balance Overall balance assessment: Needs assistance Sitting-balance support: Single extremity supported, Feet supported Sitting balance-Leahy Scale: Fair Sitting balance - Comments:  pt able to maintain balance without UE support when focused on task of sitting, however when distracted pt needs single UE support or MinA to maintain sitting balance.   Standing balance support: During functional activity Standing balance-Leahy Scale: Poor    Special needs/care consideration Bowel mgmt:continent Bladder mgmt: continent    Previous Home Environment Living Arrangements: Spouse/significant other  Lives With: Spouse Available Help at Discharge: Family, Available 24 hours/day Type of Home: Mobile home Home Layout: One level Home Access: Ramped entrance, Other (comment) (ramp in the back that pt's father used before he died) Bathroom Shower/Tub: Multimedia programmer: Standard Bathroom Accessibility: Yes How Accessible: Accessible via walker [place]: No  Discharge Living Setting Plans for Discharge Living Setting: Mobile Home, Lives with (comment)  (spouse) Type of Home at Discharge: Mobile home Discharge Home Layout: One level Discharge Home Access: Ramped entrance, Other (comment) (ramp in back that her dad used before he died) Discharge Bathroom Shower/Tub: Walk-in shower Discharge Bathroom Toilet: Standard Discharge Bathroom Accessibility: Yes How Accessible: Accessible via walker Does the patient have any problems obtaining your medications?: No  Social/Family/Support Systems Patient Roles: Spouse, Parent Contact Information: [name], spouse Anticipated Caregiver: spouse Anticipated Ambulance person Information: see above Ability/Limitations of Caregiver: spouse can take FMLA Caregiver Availability: 24/7 Discharge Plan Discussed with Primary Caregiver: Yes Is Caregiver In Agreement with Plan?: Yes Does Caregiver/Family have Issues with Lodging/Transportation while Pt is in Rehab?: No  Spouse can take fmla for up to one year due to his saved time. He typically works 7 a until 3 pm.   Goals/Additional Needs Patient/Family Goal for Rehab: Mod I with PT, OT, and SLP Expected length of stay: ELOS 14-18 days Pt/Family Agrees to Admission and willing to participate: Yes Program Orientation Provided & Reviewed with Pt/Caregiver Including Roles  & Responsibilities: Yes   Decrease burden of Care through IP rehab admission: n/a  Possible need for SNF placement upon discharge:not anticipated  Patient Condition: This patient's condition remains as documented in the consult dated 01/22/2015, in which the Rehabilitation Physician determined and documented that the patient's condition is appropriate for intensive rehabilitative care in an inpatient rehabilitation facility. Will admit to inpatient rehab today.  Preadmission Screen Completed By:  Cleatrice Burke, 01/22/2015 12:19 PM ______________________________________________________________________   Discussed status with Dr. Naaman Plummer on 01/22/2015 at  1217  and received  telephone approval for admission today.  Admission Coordinator:  Cleatrice Burke, time 9458 Date 01/22/2015.

## 2015-01-22 NOTE — Discharge Summary (Signed)
Patient ID: Diane Hutchinson MRN: [id number] DOB/AGE: May 24, 1946 69 y.o.  Admit date: 01/20/2015 Discharge date: 01/22/2015  Admission Diagnoses: Brain aneurysm-Left MCA  Discharge Diagnoses:  Active Problems:   Brain aneurysm   Cerebral embolism with cerebral infarction   Ataxia   Stroke S/p Bilateral common carotid and Left vertebral arteriograms followed by elective embolization with stent of left MCA aneurysm on 01/20/15- Dr. Estanislado Pandy   Discharged Condition: Stable  Hospital Course: Diane Hutchinson is a 70 y.o. female with history of multiple intracranial aneurysms and c/o of worsening frontal headaches. She has been seen in consult with Dr. Estanislado Pandy on 01/07/15 and scheduled on 3/9 for image guided cerebral arteriogram with elective endovascular treatment of left MCA aneurysm. She presented as an outpatient to short stay on 3/9 and during intake H&P she denied any extremity weakness, difficulty with speech, diplopia, loss or vision or blurry vision. She did state that her gait is off balance to the left side at times. She denied any chest pain, shortness of breath or palpitations. She denied any active signs of bleeding or excessive bruising. She denied any recent fever or chills or urinary symptoms.   She underwent successful bilateral common carotid and Left vertebral arteriograms followed by elective embolization with stent of left MCA aneurysm on 01/20/15 with Dr. Estanislado Pandy. She was extubated without difficulty and was admitted to Neuro ICU for close observation. Dr. Estanislado Pandy was informed that at approximately 3pm the patient was c/o numbness and tingling in her LUE and RN felt this was secondary to arterial line. Upon examination the patient was felt to demonstrate left sided weakness and ataxia, a code stroke was called and CT head and CTA of head and neck were performed without any acute findings. She was monitored overnight with Neurology consult as well as PT/OT  consults. A repeat CT head today revealed new decreased attenuation in right thalamus laterally-consistent with evolving ischemia. The patient continues to have left sided weakness, ataxia and decreased sensation on the left side. She has been evaluated by rehab, PT/OT and would benefit from inpatient rehab prior to discharging home.  She has been seen and examined today by Dr. Estanislado Pandy who feels she is stable to be discharged to inpatient rehab today. She is to continue all home medications, including ASA 325mg  and Plavix 75mg  daily.   The patient will F/U with Dr. Estanislado Pandy as an outpatient in approximately 2 weeks, our office will call her with an appointment.   Consults: Anesthesia, Neurology, Rehab, PT/OT  Significant Diagnostic Studies: CT head, CTA head and neck  Treatments: S/p elective embolization with stent of left MCA aneurysm on 01/20/15- Dr. Estanislado Pandy   Discharge Exam: Blood pressure 135/71, pulse 70, temperature 98.9 F (37.2 C), temperature source Oral, resp. rate 18, height 5\' 7"  (1.702 m), weight 178 lb (80.74 kg), SpO2 99 %.   General: A&Ox3, NAD Heart: RRR without M/G/R Lungs: CTA b/l Abd: Soft, NT, ND, (+) BS Ext: RCFA access dressing C/D/I, soft, no signs of bleeding/hematoma Neuro: Speech clear- no aphasia, EOMI, shoulder shrug intact and strong against resistance, smile and tongue symmetrical-may have slight left sided asymmetry, LUE ataxia and 3/5 strength compared to RUE 5/5, LLE dorsiflexion weakness, plantar flexion intact and strong, RLE strength 5/5, left sided sensation decreased, LUE and LLE drift  Disposition:      Medication List    ASK your doctor about these medications        albuterol (2.5 MG/3ML) 0.083% nebulizer solution  Commonly known as:  PROVENTIL  Take 2.5 mg by nebulization every 6 (six) hours as needed for wheezing or shortness of breath.     alendronate 70 MG tablet  Commonly known as:  FOSAMAX  Take 70 mg by mouth every Monday. Take  with a full glass of water on an empty stomach.     ALPRAZolam 1 MG tablet  Commonly known as:  XANAX  Take 1 mg by mouth 3 (three) times daily as needed for anxiety.     aspirin 81 MG tablet  Take 324 mg by mouth once.     atorvastatin 40 MG tablet  Commonly known as:  LIPITOR  Take 40 mg by mouth daily.     clopidogrel 75 MG tablet  Commonly known as:  PLAVIX  Take 75 mg by mouth daily.     FISH OIL PO  Take 1 capsule by mouth daily.     gemfibrozil 600 MG tablet  Commonly known as:  LOPID  Take 600 mg by mouth 2 (two) times daily before a meal.     ipratropium 0.02 % nebulizer solution  Commonly known as:  ATROVENT  Take 0.5 mg by nebulization every 6 (six) hours as needed for wheezing or shortness of breath.     meloxicam 15 MG tablet  Commonly known as:  MOBIC  Take 15 mg by mouth daily.     risperiDONE 0.5 MG tablet  Commonly known as:  RISPERDAL  Take 0.5 mg by mouth at bedtime.     sertraline 100 MG tablet  Commonly known as:  ZOLOFT  Take 200 mg by mouth at bedtime.     TOPROL XL 25 MG 24 hr tablet  Generic drug:  metoprolol succinate  Take 25 mg by mouth at bedtime.     traZODone 150 MG tablet  Commonly known as:  DESYREL  Take 300 mg by mouth at bedtime.     VITAMIN D PO  Take 1 tablet by mouth daily.     VITAMIN E PO  Take 1 tablet by mouth daily.         SignedHedy Jacob 01/22/2015, 11:26 AM   I have spent Greater Than 30 Minutes discharging Diane Hutchinson to inpatient Rehab

## 2015-01-23 ENCOUNTER — Inpatient Hospital Stay (HOSPITAL_COMMUNITY): Payer: Medicare Other | Admitting: Speech Pathology

## 2015-01-23 ENCOUNTER — Inpatient Hospital Stay (HOSPITAL_COMMUNITY): Payer: Medicare Other | Admitting: Physical Therapy

## 2015-01-23 ENCOUNTER — Inpatient Hospital Stay (HOSPITAL_COMMUNITY): Payer: Medicare Other

## 2015-01-23 DIAGNOSIS — M6281 Muscle weakness (generalized): Secondary | ICD-10-CM | POA: Diagnosis not present

## 2015-01-23 DIAGNOSIS — I671 Cerebral aneurysm, nonruptured: Secondary | ICD-10-CM | POA: Diagnosis not present

## 2015-01-23 NOTE — Evaluation (Signed)
Occupational Therapy Assessment and Plan  Patient Details  Name: Diane Hutchinson MRN: [id number] Date of Birth: 1945/12/20  OT Diagnosis: abnormal posture, ataxia and hemiplegia affecting non-dominant side Rehab Potential: Rehab Potential (ACUTE ONLY): Good ELOS: 3-4 weeks   Today's Date: 01/23/2015 OT Individual Time: 1100-1200 OT Individual Time Calculation (min): 60 min     Problem List:  Patient Active Problem List   Diagnosis Date Noted  . Embolic cerebral infarction 01/22/2015  . Left hemiparesis 01/22/2015  . Stroke   . Cerebral embolism with cerebral infarction 01/21/2015  . Ataxia   . Brain aneurysm 01/20/2015  . TOBACCO ABUSE 01/04/2010  . BRONCHITIS, CHRONIC 01/04/2010  . CERVICAL CANCER 01/03/2010  . HYPERLIPIDEMIA 01/03/2010  . DEPRESSION 01/03/2010  . SUBARACHNOID HEMORRHAGE 01/03/2010  . C O P D 01/03/2010    Past Medical History:  Past Medical History  Diagnosis Date  . Migraine   . Depression   . Hypertension   . Stroke   . COPD (chronic obstructive pulmonary disease)   . Anxiety     h/o of panic attack  . GERD (gastroesophageal reflux disease)     occas. use of TUMS  . Arthritis     knees   Past Surgical History:  Past Surgical History  Procedure Laterality Date  . [place] surgery    . Cholecystectomy    . Appendectomy    . Eye surgery      laser to both eyes for blocked tear ducts   . Brain surgery  1993    aneurysm  . Tubal ligation    . Hemorroidectomy    . Radiology with anesthesia N/A 01/20/2015    Procedure: RADIOLOGY WITH ANESTHESIA;  Surgeon: Luanne Bras, MD;  Location: [place];  Service: Radiology;  Laterality: N/A;    Assessment & Plan Clinical Impression: Patient is a 69 y.o. year old female with history of hypertension, migraine headaches as well as multiple intracranial aneurysms with right MCA aneurysmal clipping 1993 as well as coiling 2010 without residual weakness.. Independent/driving prior to admission living  with her husband. Presented 01/20/2015 with decreased balance as well as headache. She had been seen by interventional radiology in the past with plan for image guided cerebral arteriogram for findings a left MCA aneurysm. Underwent left MCA elective embolization using stent assistance 01/20/2015 per Intervention radiology. Patient was extubated noted left-sided tingling and weakness. Code stroke was called. CT of the head showed no acute stroke. CTA was then performed again showing no large vessel occlusion. She was not a TPA candidate. Neurology consulted suspect non-dominant right brain infarct, embolic secondary to complication of interventional neuroradiology aneurysm embolization. Echocardiogram is pending. She currently is maintained on aspirin and Plavix therapy. Subcutaneous Lovenox added for DVT prophylaxis. Bouts of restlessness and agitation her Risperdal and Desyrel as prior to admission was resumed. Physical therapy evaluation completed 01/21/2015 with recommendations of physical medicine rehabilitation consult.    Patient transferred to CIR on 01/22/2015 .    Patient currently requires max with basic self-care skills secondary to decreased sitting balance, decreased standing balance, hemiplegia and decreased balance strategies.  Prior to hospitalization, patient could complete BADL/iADL with supervision.  Patient will benefit from skilled intervention to increase independence with basic self-care skills prior to discharge home with care partner.  Anticipate patient will require minimal physical assistance and follow up home health.  OT - End of Session Activity Tolerance: Tolerates 10 - 20 min activity with multiple rests Endurance Deficit: Yes OT Assessment Rehab Potential (  ACUTE ONLY): Good OT Patient demonstrates impairments in the following area(s): Balance;Cognition;Behavior;Endurance;Motor;Safety;Sensory OT Basic ADL's Functional Problem(s): Grooming;Bathing;Dressing;Toileting OT  Transfers Functional Problem(s): Toilet;Tub/Shower OT Additional Impairment(s): Fuctional Use of Upper Extremity OT Plan OT Intensity: Minimum of 1-2 x/day, 45 to 90 minutes OT Duration/Estimated Length of Stay: 3-4 weeks OT Treatment/Interventions: Balance/vestibular training;Neuromuscular re-education;Patient/family education;Self Care/advanced ADL retraining;Therapeutic Exercise;UE/LE Coordination activities;UE/LE Strength taining/ROM;Therapeutic Activities;Functional mobility training;DME/adaptive equipment instruction;Discharge planning;Disease mangement/prevention;Cognitive remediation/compensation OT Self Feeding Anticipated Outcome(s): Mod I OT Basic Self-Care Anticipated Outcome(s): Min A OT Toileting Anticipated Outcome(s): Supervision OT Bathroom Transfers Anticipated Outcome(s): Supervision OT Recommendation Patient destination: Home Follow Up Recommendations: Home health OT Equipment Recommended: To be determined  Skilled Therapeutic Intervention OT initial evaluation completed with intervention provided to address adapted bathing/dressing skills, dynamic sitting/standing balance, toileting, and transfers to/from bed, w/c, bsc, shower chair.   Pt demo's ataxia at left UE and LE with gross incoordination and marked weakness throughout session.   Pt's endurance was challenged with ADL and she required 4 rest breaks to progress through tasks with close supervision and overall max assist for safety d/t impaired balance.  Pt's daughter and husband were present at beginning and ending of session only.   Pt's daughter was educated on pt's performance with ADL and recommendations per safety plan.  OT Evaluation Precautions/Restrictions  Precautions Precautions: Fall Restrictions Weight Bearing Restrictions: No  General Chart Reviewed: Yes Family/Caregiver Present: Yes (pt's husband and daughter)  Vital Signs Therapy Vitals Temp: 98.7 F (37.1 C) Temp Source: Oral Pulse Rate:  79 Resp: 17 BP: (!) 145/70 mmHg Patient Position (if appropriate): Sitting Oxygen Therapy SpO2: 96 % O2 Device: Not Delivered  Pain Pain Assessment Pain Assessment: No/denies pain  Home Living/Prior Functioning Home Living Available Help at Discharge: Family, Available 24 hours/day Type of Home: Mobile home Home Access: Ramped entrance, Other (comment) Home Layout: One level  Lives With: Spouse IADL History Homemaking Responsibilities: Yes Meal Prep Responsibility: Secondary Laundry Responsibility: Primary Cleaning Responsibility: Secondary Bill Paying/Finance Responsibility: Primary Shopping Responsibility: Secondary Child Care Responsibility: No Current License: Yes Mode of Transportation: Car Education: GED Occupation: Retired, On disability Type of Occupation: Engineer, manufacturing systems, 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 Leisure and Hobbies: 2 dogs inside, 1 outside Prior Function Level of Independence: Independent with basic ADLs  Able to Take Stairs?: Yes Driving: Yes Vocation: On disability  ADL ADL ADL Comments: see FIM  Vision/Perception  Vision- History Baseline Vision/History: Wears glasses Wears Glasses: At all times Patient Visual Report: No change from baseline   Cognition Overall Cognitive Status: Within Functional Limits for tasks assessed Arousal/Alertness: Awake/alert Orientation Level: Oriented X4 Attention: Focused Focused Attention: Appears intact Memory: Impaired Memory Impairment: Decreased recall of new information Awareness: Appears intact Problem Solving: Impaired Problem Solving Impairment: Functional basic Executive Function: Self Monitoring Self Monitoring: Impaired Self Monitoring Impairment: Functional complex Behaviors: Impulsive;Restless Safety/Judgment: Impaired  Sensation Sensation Light Touch: Impaired Detail Light Touch Impaired Details: Impaired LUE;Impaired LLE Stereognosis: Impaired Detail Stereognosis Impaired Details: Impaired LUE;Impaired  LLE Hot/Cold: Appears Intact Proprioception: Impaired Detail Proprioception Impaired Details: Impaired LUE;Impaired LLE Coordination Gross Motor Movements are Fluid and Coordinated: No (impaired LUE) Fine Motor Movements are Fluid and Coordinated: No (impaired LUE)  Motor  Motor Motor: Ataxia Motor - Skilled Clinical Observations: ataxia LUE, LLE  Mobility  Transfers Transfers: Sit to Stand;Stand to Sit Sit to Stand: 3: Mod assist Sit to Stand Details: Tactile cues for placement;Manual facilitation for weight shifting;Verbal cues for precautions/safety;Verbal cues for technique;Tactile cues for posture Stand to Sit: 4: Min assist Stand  to Sit Details (indicate cue type and reason): Manual facilitation for weight shifting;Verbal cues for sequencing;Tactile cues for posture   Trunk/Postural Assessment  Thoracic Assessment Thoracic Assessment: Exceptions to [facility] (kyphotic posture) Lumbar Assessment Lumbar Assessment: Within Functional Limits Postural Control Postural Control: Deficits on evaluation Trunk Control: left lateral and posterior lean Protective Responses: delayed   Balance     Extremity/Trunk Assessment RUE Assessment RUE Assessment: Within Functional Limits LUE Assessment LUE Assessment: Exceptions to WFL LUE AROM (degrees) Overall AROM Left Upper Extremity: Deficits (impaired shoulder flexion and forearm supination during functional task) LUE Strength LUE Overall Strength: Deficits (3-/5)  FIM:  FIM - Eating Eating Activity: 7: Complete independence:no helper FIM - Grooming Grooming Steps: Wash, rinse, dry face;Wash, rinse, dry hands Grooming: 3: Patient completes 2 of 4 or 3 of 5 steps FIM - Bathing Bathing Steps Patient Completed: Chest;Left Arm;Abdomen;Front perineal area;Buttocks;Right upper leg;Left upper leg;Right lower leg (including foot) Bathing: 3: Mod-Patient completes 5-7 [id number] parts or 50-74% FIM - Upper Body Dressing/Undressing Upper body  dressing/undressing steps patient completed: Thread/unthread right sleeve of pullover shirt/dresss;Thread/unthread right bra strap;Thread/unthread left bra strap Upper body dressing/undressing: 2: Max-Patient completed 25-49% of tasks FIM - Lower Body Dressing/Undressing Lower body dressing/undressing: 1: Total-Patient completed less than 25% of tasks FIM - Toileting Toileting steps completed by patient: Performs perineal hygiene Toileting: 2: Max-Patient completed 1 of 3 steps FIM - Bed/Chair Transfer Bed/Chair Transfer Assistive Devices: Bed rails;Arm rests Bed/Chair Transfer: 3: Supine > Sit: Mod A (lifting assist/Pt. 50-74%/lift 2 legs;3: Sit > Supine: Mod A (lifting assist/Pt. 50-74%/lift 2 legs);2: Bed > Chair or W/C: Max A (lift and lower assist);3: Chair or W/C > Bed: Mod A (lift or lower assist) FIM - TRadio producerDevices: Grab bars Toilet Transfers: 3-To toilet/BSC: Mod A (lift or lower assist);3-From toilet/BSC: Mod A (lift or lower assist) FIM - TSystems developerDevices: Shower chair;Grab bars;Walk in shower Tub/shower Transfers: 2-Into Tub/Shower: Max A (lift and lower assist);3-Out of Tub/Shower: Mod A (lift or lower/lift 2 legs)   Refer to Care Plan for Long Term Goals  Recommendations for other services: None  Discharge Criteria: Patient will be discharged from OT if patient refuses treatment 3 consecutive times without medical reason, if treatment goals not met, if there is a change in medical status, if patient makes no progress towards goals or if patient is discharged from hospital.  The above assessment, treatment plan, treatment alternatives and goals were discussed and mutually agreed upon: by patient  [facility]3/10/2015, 3:30 PM

## 2015-01-23 NOTE — Evaluation (Signed)
Physical Therapy Assessment and Plan  Patient Details  Name: Diane Hutchinson MRN: [id number] Date of Birth: Sep 19, 1946  PT Diagnosis: Ataxia, Ataxic gait and Muscle weakness Rehab Potential: Fair ELOS: 25 to 28 days   Today's Date: 01/23/2015 PT Individual Time: 1000-1100 PT Individual Time Calculation (min): 60 min    Problem List:  Patient Active Problem List   Diagnosis Date Noted  . Embolic cerebral infarction 01/22/2015  . Left hemiparesis 01/22/2015  . Stroke   . Cerebral embolism with cerebral infarction 01/21/2015  . Ataxia   . Brain aneurysm 01/20/2015  . TOBACCO ABUSE 01/04/2010  . BRONCHITIS, CHRONIC 01/04/2010  . CERVICAL CANCER 01/03/2010  . HYPERLIPIDEMIA 01/03/2010  . DEPRESSION 01/03/2010  . SUBARACHNOID HEMORRHAGE 01/03/2010  . C O P D 01/03/2010    Past Medical History:  Past Medical History  Diagnosis Date  . Migraine   . Depression   . Hypertension   . Stroke   . COPD (chronic obstructive pulmonary disease)   . Anxiety     h/o of panic attack  . GERD (gastroesophageal reflux disease)     occas. use of TUMS  . Arthritis     knees   Past Surgical History:  Past Surgical History  Procedure Laterality Date  . [place] surgery    . Cholecystectomy    . Appendectomy    . Eye surgery      laser to both eyes for blocked tear ducts   . Brain surgery  1993    aneurysm  . Tubal ligation    . Hemorroidectomy    . Radiology with anesthesia N/A 01/20/2015    Procedure: RADIOLOGY WITH ANESTHESIA;  Surgeon: Luanne Bras, MD;  Location: [place];  Service: Radiology;  Laterality: N/A;    Assessment & Plan Clinical Impression: Patient is a 69 y.o. right handed female with history of hypertension, migraine headaches as well as multiple intracranial aneurysms with right MCA aneurysmal clipping 1993 as well as coiling 2010 without residual weakness.. Independent/driving prior to admission living with her husband. Presented 01/20/2015 with decreased  balance as well as headache. She had been seen by interventional radiology in the past with plan for image guided cerebral arteriogram for findings a left MCA aneurysm. Underwent left MCA elective embolization using stent assistance 01/20/2015 per Intervention radiology. Patient was extubated noted left-sided tingling and weakness. Code stroke was called. CT of the head showed no acute stroke. CTA was then performed again showing no large vessel occlusion. She was not a TPA candidate. Neurology consulted suspect non-dominant right brain infarct, embolic secondary to complication of interventional neuroradiology aneurysm embolization. Echocardiogram is pending. She currently is maintained on aspirin and Plavix therapy. Subcutaneous Lovenox added for DVT prophylaxis. Bouts of restlessness and agitation her Risperdal and Desyrel as prior to admission was resumed. Physical therapy evaluation completed 01/21/2015 with recommendations of physical medicine rehabilitation consult.  Patient transferred to CIR on 01/22/2015 .   Patient currently requires max with mobility secondary to muscle weakness, decreased cardiorespiratoy endurance and ataxia.  Prior to hospitalization, patient was independent  with mobility and lived with Spouse in a Mobile home home.  Home access is  Ramped entrance (ramped entrance in back, 5 steps with 2 rails in front).  Patient will benefit from skilled PT intervention to maximize safe functional mobility, minimize fall risk and decrease caregiver burden for planned discharge home with 24 hour assist.  Anticipate patient will benefit from follow up [facility] at discharge.  PT - End of Session  Activity Tolerance: Tolerates 30+ min activity with multiple rests Endurance Deficit: Yes PT Assessment Rehab Potential (ACUTE/IP ONLY): Fair Barriers to Discharge: Decreased caregiver support PT Patient demonstrates impairments in the following area(s): Balance;Endurance;Safety;Motor PT Transfers  Functional Problem(s): Bed Mobility;Bed to Chair;Car PT Locomotion Functional Problem(s): Ambulation;Wheelchair Mobility;Stairs PT Plan PT Intensity: Minimum of 1-2 x/day ,45 to 90 minutes PT Frequency: 5 out of 7 days PT Duration Estimated Length of Stay: 25 to 28 days PT Treatment/Interventions: Ambulation/gait training;Balance/vestibular training;Disease management/prevention;Discharge planning;DME/adaptive equipment instruction;Functional mobility training;Patient/family education;Neuromuscular re-education;Psychosocial support;Splinting/orthotics;Therapeutic Exercise;Visual/perceptual remediation/compensation;UE/LE Coordination activities;Therapeutic Activities;Stair training;UE/LE Strength taining/ROM;Wheelchair propulsion/positioning PT Transfers Anticipated Outcome(s): S for transfers PT Locomotion Anticipated Outcome(s): S for gait, min A for stairs. mod I w/c mobility. PT Recommendation Follow Up Recommendations: Home health PT Patient destination: Home Equipment Recommended: To be determined  Skilled Therapeutic Intervention PT evaluation completed and treatment plan initiated. Pt performed multiple sit to stand transfers with fluctuating level of assistance from mod to max A. Following treatment discussed POC with patient and daughter.   PT Evaluation Precautions/Restrictions Precautions Precautions: Fall Precaution Comments: L inattention Restrictions Weight Bearing Restrictions: No General Chart Reviewed: Yes Family/Caregiver Present: Yes (daughter present at end of evaluation)  Pain No c/o pain.  Home Living/Prior Functioning Home Living Available Help at Discharge: Family;Available 24 hours/day Type of Home: Mobile home Home Access: Ramped entrance (ramped entrance in back, 5 steps with 2 rails in front) Home Layout: One level  Lives With: Spouse Prior Function Level of Independence: Independent with gait;Independent with transfers  Able to Take Stairs?:  Yes Driving: Yes Vision/Perception  Vision - Assessment Additional Comments: appears to have L visual field cut L inattention, does not attend to left side of body or left visual field  Cognition Overall Cognitive Status: Within Functional Limits for tasks assessed Arousal/Alertness: Awake/alert Orientation Level: Oriented X4 Memory Impairment: Decreased recall of new information Behaviors: Restless;Impulsive;Poor frustration tolerance Safety/Judgment: Impaired Sensation Sensation Light Touch: Impaired Detail Light Touch Impaired Details: Impaired LUE;Impaired LLE Hot/Cold: Appears Intact Proprioception: Impaired Detail Proprioception Impaired Details: Impaired LLE;Impaired LUE Coordination Gross Motor Movements are Fluid and Coordinated: No Fine Motor Movements are Fluid and Coordinated: No Motor  Motor Motor: Ataxia Motor - Skilled Clinical Observations: ataxia L UE and LE  Mobility Bed Mobility Bed Mobility: Supine to Sit Supine to Sit: 2: Max assist;HOB flat (no rails, to L side) Transfers Transfers: Yes Sit to Stand: 3: Mod assist Sit to Stand Details: Tactile cues for placement;Manual facilitation for weight shifting;Verbal cues for precautions/safety;Verbal cues for technique;Tactile cues for posture Stand to Sit: 3: Mod assist Stand to Sit Details (indicate cue type and reason): Manual facilitation for weight shifting;Verbal cues for sequencing;Tactile cues for posture Stand Pivot Transfers: 2: Max assist Locomotion  Ambulation Ambulation: Yes Ambulation/Gait Assistance: 2: Max assist Ambulation Distance (Feet): 10 Feet Assistive device: Other (Comment) (R hand rail) Gait Gait: Yes Gait Pattern: Ataxic Stairs / Additional Locomotion Stairs: Yes Stairs Assistance: Other (comment) (attempted x 2 however unable to complete secondary to fear and fatigue) Wheelchair Mobility Wheelchair Mobility: Yes Wheelchair Assistance: 4: Min Lexicographer:  Right upper extremity;Right lower extremity Distance: 25  Trunk/Postural Assessment  Thoracic Assessment Thoracic Assessment: Exceptions to [facility] (kyphotic posture) Lumbar Assessment Lumbar Assessment: Within Functional Limits Postural Control Postural Control: Deficits on evaluation Trunk Control: left lateral and posterior lean Protective Responses: delayed  Balance Balance Balance Assessed: Yes Static Sitting Balance Static Sitting - Balance Support: Feet supported;Right upper extremity supported Static Sitting - Level of Assistance:  5: Stand by assistance Static Standing Balance Static Standing - Balance Support: During functional activity Static Standing - Level of Assistance: 3: Mod assist Dynamic Standing Balance Dynamic Standing - Balance Support: During functional activity Dynamic Standing - Level of Assistance: 2: Max assist Extremity Assessment B UEs as per OT evaluation.    RLE Assessment RLE Assessment: Within Functional Limits LLE Assessment LLE Assessment: Exceptions to WFL LLE PROM (degrees) Overall PROM Left Lower Extremity: Within functional limits for tasks assessed LLE Strength LLE Overall Strength: Deficits LLE Overall Strength Comments: grossly at least 3-/5 throughout except DF 2-/5  FIM:  FIM - Bed/Chair Transfer Bed/Chair Transfer Assistive Devices: Arm rests Bed/Chair Transfer: 2: Supine > Sit: Max A (lifting assist/Pt. 25-49%);2: Chair or W/C > Bed: Max A (lift and lower assist) (supine to sit to L side ) FIM - Locomotion: Wheelchair Distance: 25 Locomotion: Wheelchair: 1: Travels less than 50 ft with minimal assistance (Pt.>75%) FIM - Locomotion: Ambulation Locomotion: Ambulation Assistive Devices: Other (comment) (R hallway rail) Ambulation/Gait Assistance: 2: Max assist Locomotion: Ambulation: 1: Travels less than 50 ft with maximal assistance (Pt: 25 - 49%) FIM - Locomotion: Stairs Locomotion: Stairs: 0: Activity did not occur (activity  attempted however unable to complete secondary to fear and fatigue)   Refer to Care Plan for Long Term Goals  Recommendations for other services: None  Discharge Criteria: Patient will be discharged from PT if patient refuses treatment 3 consecutive times without medical reason, if treatment goals not met, if there is a change in medical status, if patient makes no progress towards goals or if patient is discharged from hospital.  The above assessment, treatment plan, treatment alternatives and goals were discussed and mutually agreed upon: by patient and by family  Dub Amis 01/23/2015, 4:02 PM

## 2015-01-23 NOTE — Plan of Care (Signed)
Problem: RH BOWEL ELIMINATION Goal: RH STG MANAGE BOWEL WITH ASSISTANCE STG Manage Bowel with Assistance. Min assist New admit Goal: RH STG MANAGE BOWEL W/MEDICATION W/ASSISTANCE STG Manage Bowel with Medication with Assistance. Min assist New admit  Problem: RH SKIN INTEGRITY Goal: RH STG SKIN FREE OF INFECTION/BREAKDOWN Skin free of infection/breakdown with mod assist New admit Goal: RH STG MAINTAIN SKIN INTEGRITY WITH ASSISTANCE STG Maintain Skin Integrity With Mod Assistance.  New admit Goal: RH OTHER STG SKIN INTEGRITY GOALS W/ASSIST Other STG Skin Integrity Goals With Mod Assistance.  New admit  Problem: RH SAFETY Goal: RH STG ADHERE TO SAFETY PRECAUTIONS W/ASSISTANCE/DEVICE STG Adhere to Safety Precautions With Min Assistance/Device.  New admit Goal: RH OTHER STG SAFETY GOALS W/ASSIST Other STG Safety Goals With [employer].  New admit  Problem: RH COGNITION-NURSING Goal: RH STG USES MEMORY AIDS/STRATEGIES W/ASSIST TO PROBLEM SOLVE STG Uses Memory Aids/Strategies With Mod Assistance to Problem Solve.  New admit Goal: RH STG ANTICIPATES NEEDS/CALLS FOR ASSIST W/ASSIST/CUES STG Anticipates Needs/Calls for Assist With Min Assistance/Cues.  New admit Goal: RH OTHER STG COGNITION GOALS W/ASSIST Other STG Cognition Goals With Mod Assistance.  New admit  Problem: RH PAIN MANAGEMENT Goal: RH STG PAIN MANAGED AT OR BELOW PT'S PAIN GOAL Pain managed at or below 3 New admit  Problem: RH KNOWLEDGE DEFICIT Goal: RH STG INCREASE KNOWLEDGE OF HYPERTENSION New admit

## 2015-01-23 NOTE — Progress Notes (Signed)
Patient ID: Diane Hutchinson, female   DOB: Sep 08, 1946, 69 y.o.   MRN: [id number]   01/23/15.  69 year old patient who was admitted  Yesterday for CIR following a right brain embolic stroke.  Comfortable night.  No other concerns or complaints today.  Past Medical History  Diagnosis Date  . Migraine   . Depression   . Hypertension   . Stroke   . COPD (chronic obstructive pulmonary disease)   . Anxiety     h/o of panic attack  . GERD (gastroesophageal reflux disease)     occas. use of TUMS  . Arthritis     knees    Patient Vitals for the past 24 hrs:  BP Temp Temp src Pulse Resp SpO2  01/23/15 0524 (!) 161/66 mmHg 98.5 F (36.9 C) Oral 74 18 97 %  01/22/15 1617 (!) 163/66 mmHg 98.8 F (37.1 C) Oral 75 20 96 %     Intake/Output Summary (Last 24 hours) at 01/23/15 0825 Last data filed at 01/22/15 1800  Gross per 24 hour  Intake    120 ml  Output      0 ml  Net    120 ml    Gen- alert and appropriate Heent- neg Neck- no adenopathy or neck vein distention Chest-clear to auscultation Cardiovascular-regular rate and rhythm, no murmur Abdomen soft and nontender.  No distention Extremities no edema Neuro-alert and oriented  with normal speech; left hemiparesis, arm affected greater than leg   Medical Problem List and Plan: 1. 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 DVT Prophylaxis/Anticoagulation: SQ Lovenox.monitor for any bleeding episodes 3. Pain Management: tylenol as needed 4. Mood/depression/anxiety: Risperdal 0.5 mg QHS/Desyrel 150 mg QHS,Zoloft 100 mg daily,Xanax 1 mg TID as needed. Team to provide ego-support as well. 5. Neuropsych: This patient is capable of making decisions on her own behalf. 6. Skin/Wound Care: Routine skin checks 7. Fluids/Electrolytes/Nutrition: Strict I & O.Follow up labs 8.Hyperlipidemia.Lipitor/Lovaza

## 2015-01-23 NOTE — Evaluation (Signed)
Speech Language Pathology Assessment and Plan  Patient Details  Name: Diane Hutchinson MRN: [id number] Date of Birth: [birth year]  SLP Diagnosis:  (N/A)  Rehab Potential: N/A ELOS: N/A    Today's Date: 01/23/2015 SLP Individual Time: 1400-1500 SLP Individual Time Calculation (min): 60 min   Problem List:  Patient Active Problem List   Diagnosis Date Noted  . Embolic cerebral infarction 01/22/2015  . Left hemiparesis 01/22/2015  . Stroke   . Cerebral embolism with cerebral infarction 01/21/2015  . Ataxia   . Brain aneurysm 01/20/2015  . TOBACCO ABUSE 01/04/2010  . BRONCHITIS, CHRONIC 01/04/2010  . CERVICAL CANCER 01/03/2010  . HYPERLIPIDEMIA 01/03/2010  . DEPRESSION 01/03/2010  . SUBARACHNOID HEMORRHAGE 01/03/2010  . C O P D 01/03/2010   Past Medical History:  Past Medical History  Diagnosis Date  . Migraine   . Depression   . Hypertension   . Stroke   . COPD (chronic obstructive pulmonary disease)   . Anxiety     h/o of panic attack  . GERD (gastroesophageal reflux disease)     occas. use of TUMS  . Arthritis     knees   Past Surgical History:  Past Surgical History  Procedure Laterality Date  . [place] surgery    . Cholecystectomy    . Appendectomy    . Eye surgery      laser to both eyes for blocked tear ducts   . Brain surgery  1993    aneurysm  . Tubal ligation    . Hemorroidectomy    . Radiology with anesthesia N/A 01/20/2015    Procedure: RADIOLOGY WITH ANESTHESIA;  Surgeon: Luanne Bras, MD;  Location: [facility];  Service: Radiology;  Laterality: N/A;    Assessment / Plan / Recommendation Clinical Impression Patient is a 69 y.o. right handed female with history of hypertension, migraine headaches as well as multiple intracranial aneurysms with right MCA aneurysmal clipping 1993 as well as coiling 2010 without residual weakness. Independent/driving prior to admission living with her husband. Presented 01/20/2015 with decreased balance as well as  headache. She had been seen by interventional radiology in the past with plan for image guided cerebral arteriogram for findings of a left MCA aneurysm. Underwent left MCA elective embolization using stent assistance 01/20/2015 per intervention radiology. Patient was extubated and reported left-sided tingling and weakness. Code stroke was called. CT of the head showed no acute stroke. CTA was then performed again showing no large vessel occlusion. She was not a TPA candidate. Neurology consulted, suspect non-dominant right brain infarct, embolic secondary to complication of interventional neuroradiology aneurysm embolization. Patient has had bouts of restlessness and agitation and her Risperdal and Desyrel as prior to admission was resumed. Physical therapy evaluation completed 01/21/2015 with recommendations of physical medicine rehabilitation consult. Patient was admitted for a comprehensive rehab program on 01/22/15. Patient administered a cognitive-linguistic evaluation and patient's cognitive-linguistic function appears [facility] for all tasks assessed. Both the patient and her husband report that the patient is at her cognitive baseline, therefore, skilled SLP intervention is not warranted at this time.   Skilled Therapeutic Interventions          Administered a cognitive-linguistic evaluation. Please see above for details. Patient and her husband report patient is at her cognitive baseline, therefore, skilled SLP intervention is not warranted at this time.   SLP Assessment  Patient does not need any further Speech Lanaguage Pathology Services    Recommendations  Oral Care Recommendations: Oral care BID Patient destination: Home  Follow up Recommendations: None Equipment Recommended: None recommended by SLP    SLP Frequency N/A  SLP Treatment/Interventions N/A   Pain Pain Assessment Pain Assessment: No/denies pain Prior Functioning Type of Home: Mobile home  Lives With: Spouse Available Help at  Discharge: Family;Available 24 hours/day Education: GED Vocation: On disability  Short Term Goals: N/A  See [facility]IM for current functional status Refer to Care Plan for Long Term Goals  Recommendations for other services: None  Discharge Criteria: Patient will be discharged from SLP if patient refuses treatment 3 consecutive times without medical reason, if treatment goals not met, if there is a change in medical status, if patient makes no progress towards goals or if patient is discharged from hospital.  The above assessment, treatment plan, treatment alternatives and goals were discussed and mutually agreed upon: by patient and by family  Kambrie Eddleman 01/23/2015, 3:21 PM

## 2015-01-23 NOTE — Progress Notes (Addendum)
Patient ID: BETHANEY OSHANA, female   DOB: [birth year], 69 y.o.   MRN: [id number]       Subjective:  Pt doing fairly well; no new c/o today; states LUE/LLE weakness has sl improved; awaiting rehab tx   Allergies: Codeine; Meperidine hcl; Morphine; and Sulfonamide derivatives  Medications: Prior to Admission medications   Medication Sig Start Date End Date Taking? Authorizing Provider  acetaminophen (TYLENOL) 500 MG tablet Take 2 tablets (1,000 mg total) by mouth every 6 (six) hours as needed (increased temp or pain). 01/22/15   Hedy Jacob, PA-C  albuterol (PROVENTIL) (2.5 MG/3ML) 0.083% nebulizer solution Take 2.5 mg by nebulization every 6 (six) hours as needed for wheezing or shortness of breath.    Historical Provider, MD  alendronate (FOSAMAX) 70 MG tablet Take 70 mg by mouth every Monday. Take with a full glass of water on an empty stomach.    Historical Provider, MD  ALPRAZolam Duanne Moron) 1 MG tablet Take 1 mg by mouth 3 (three) times daily as needed for anxiety.    Historical Provider, MD  aspirin 325 MG tablet Take 1 tablet (325 mg total) by mouth daily with breakfast. 01/22/15   Hedy Jacob, PA-C  atorvastatin (LIPITOR) 40 MG tablet Take 40 mg by mouth daily.    Historical Provider, MD  Cholecalciferol (VITAMIN D PO) Take 1 tablet by mouth daily.    Historical Provider, MD  clopidogrel (PLAVIX) 75 MG tablet Take 75 mg by mouth daily.    Historical Provider, MD  gemfibrozil (LOPID) 600 MG tablet Take 600 mg by mouth 2 (two) times daily before a meal.    Historical Provider, MD  ipratropium (ATROVENT) 0.02 % nebulizer solution Take 0.5 mg by nebulization every 6 (six) hours as needed for wheezing or shortness of breath.     Historical Provider, MD  meloxicam (MOBIC) 15 MG tablet Take 15 mg by mouth daily.    Historical Provider, MD  metoprolol succinate (TOPROL XL) 25 MG 24 hr tablet Take 25 mg by mouth at bedtime.     Historical Provider, MD  Omega-3 Fatty Acids (FISH OIL  PO) Take 1 capsule by mouth daily.    Historical Provider, MD  risperiDONE (RISPERDAL) 0.5 MG tablet Take 0.5 mg by mouth at bedtime.    Historical Provider, MD  sertraline (ZOLOFT) 100 MG tablet Take 200 mg by mouth at bedtime.  11/16/14   Historical Provider, MD  traZODone (DESYREL) 150 MG tablet Take 300 mg by mouth at bedtime.    Historical Provider, MD  VITAMIN E PO Take 1 tablet by mouth daily.    Historical Provider, MD     Vital Signs: BP 145/70 mmHg  Pulse 79  Temp(Src) 98.7 F (37.1 C) (Oral)  Resp 17  SpO2 96%  Physical Exam pt awake/alert; speech nl, mild left lower facial asymm;tongue midline;  no visual difficulties; strength 3-4/5 LUE/LLE; 5/5 RUE/RLE;  Diminished sens fxn LLE; rt CFA puncture site NT, mildy ecchymotic with few small blisters 2-3 cm above site Imaging: Ct Angio Head W/cm &/or Wo Cm  01/20/2015   CLINICAL DATA:  Pipeline stent placed for left MCA aneurysm. Postoperative complaints of left arm and leg weakness.  EXAM: CT ANGIOGRAPHY HEAD AND NECK  TECHNIQUE: Multidetector CT imaging of the head and neck was performed using the standard protocol during bolus administration of intravenous contrast. Multiplanar CT image reconstructions and MIPs were obtained to evaluate the vascular anatomy. Carotid stenosis measurements (when applicable) are obtained utilizing NASCET  criteria, using the distal internal carotid diameter as the denominator.  CONTRAST:  85mL OMNIPAQUE IOHEXOL 350 MG/ML SOLN  COMPARISON:  Angiography same day.  CTA 12/03/2014.  FINDINGS: CT HEAD  Brain: Previous core allowing upper right vertebral aneurysm. Previous clipping of the right MCA aneurysm. Placement of pipeline stent in the left MCA region today. No evidence of intracranial hemorrhage. 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 No sign of acute infarction by CT.  Calvarium and skull base: Postoperative changes.  Nothing a acute.  Paranasal sinuses: Clear  CTA NECK  Aortic arch: There  is advanced atherosclerotic disease of the arch with calcified plaque in either soft plaque or thrombus along the inferior margin of the arch. 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 Branching pattern of brachiocephalic vessels from the arch is normal without origin stenosis.  Right carotid system: Right common carotid artery widely patent to the bifurcation. Ordinary mild atherosclerotic disease at the carotid bifurcation without stenosis or irregularity. Cervical internal carotid artery widely patent.  Left carotid system: Left common carotid artery affected by diffuse atherosclerotic disease. Narrowing in the proximal portion of 40%. Vessel patent to the bifurcation with mild atherosclerotic change at the bifurcation but no stenosis or irregularity.  Vertebral arteries:Right vertebral artery dominant. Both vertebral artery origins are widely patent. Both vessels are patent through the cervical region to the foramen magnum.  Skeleton: Ordinary cervical spondylosis  Other neck: No significant soft tissue lesion.  CTA HEAD  Anterior circulation: Both carotid siphon regions widely patent. On the right, the anterior and middle cerebral vessels are patent. There is artifact related to previous MCA aneurysm clipping. I do not see any missing vessels on the right compared to the previous study of 12/03/2014. On the left, pipeline stent is in place beginning at the M1 segment and extending into 1 of the left MCA branches. This spans the location of the fusiform aneurysm of the MCA branch, which measures 2.6 x 5.4 mm. No missing branch vessels are seen on the left. One could question if there is mild spasm of the MCA branch of medially beyond the and of the stent. Beyond that area, the vessel appears normal as it did before.  Posterior circulation: The right vertebral artery is a large vessel widely patent to the basilar. Previously coiled right vertebral aneurysm  without evidence of recanalization. The left vertebral artery is a small vessel that largely terminates in PICA. There is not definitely any flow beyond PICA to the basilar. The basilar artery shows mild atherosclerotic irregularity but no flow-limiting stenosis. Superior cerebellar and posterior cerebral vessels are patent and normal.  Venous sinuses: Patent and normal.  Anatomic variants: No insignificant  Delayed phase: No significant finding  IMPRESSION: Pipeline stent placed in the left MCA spanning a left MCA aneurysm. No complications seen relative to that. No missing vessels. No hemorrhage. One could question mild spasm of the MCA branch just beyond the end of the stent, but the vessel is widely patent beyond that and appears as it did before.  Previously clipped right MCA region aneurysm. No missing vessels demonstrated on the right compared to the study of 12/03/2014.  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   Electronically Signed   By: Nelson Chimes M.D.   On: 01/20/2015 17:50   Ct Head Wo Contrast  01/22/2015   CLINICAL DATA:  Status post pipeline stent placed 01/20/2015, postoperative  left arm end like weakness  EXAM: CT HEAD WITHOUT CONTRAST  TECHNIQUE: Contiguous axial images were obtained from the base of the skull through the vertex without intravenous contrast.  COMPARISON:  01/20/2015  FINDINGS: Postoperative changes are noted in the calvarium on the right consistent with prior aneurysm clipping. There are changes consistent with aneurysm clipping lung course of the right middle cerebral artery as well as prior coiling along the course of the right posterior inferior cerebellar artery. These are stable from the prior exam. A new left middle cerebral artery pipeline stent is again noted.  A rounded area of decreased attenuation is noted within the right thalamus measuring approximately 13 mm. This has progressed in the interval from the prior exam  consistent with an evolving area of ischemia. No other areas of acute hemorrhage, acute infarction or acute ischemia are noted.  IMPRESSION: Changes consistent with prior aneurysm coiling, clipping and stenting as described.  New rounded area of decreased attenuation in the right thalamus laterally consistent with an evolving area of ischemia.  These results will be called to the ordering clinician or representative by the Radiologist Assistant, and communication documented in the PACS or zVision Dashboard.   Electronically Signed   By: Inez Catalina M.D.   On: 01/22/2015 07:33   Ct Head Wo Contrast  01/20/2015   CLINICAL DATA:  Pipeline stent placed for left MCA aneurysm. Postoperative complaints of left arm and leg weakness.  EXAM: CT ANGIOGRAPHY HEAD AND NECK  TECHNIQUE: Multidetector CT imaging of the head and neck was performed using the standard protocol during bolus administration of intravenous contrast. Multiplanar CT image reconstructions and MIPs were obtained to evaluate the vascular anatomy. Carotid stenosis measurements (when applicable) are obtained utilizing NASCET criteria, using the distal internal carotid diameter as the denominator.  CONTRAST:  31mL OMNIPAQUE IOHEXOL 350 MG/ML SOLN  COMPARISON:  Angiography same day.  CTA 12/03/2014.  FINDINGS: CT HEAD  Brain: Previous core allowing upper right vertebral aneurysm. Previous clipping of the right MCA aneurysm. Placement of pipeline stent in the left MCA region today. No evidence of intracranial hemorrhage. 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 No sign of acute infarction by CT.  Calvarium and skull base: Postoperative changes.  Nothing a acute.  Paranasal sinuses: Clear  CTA NECK  Aortic arch: There is advanced atherosclerotic disease of the arch with calcified plaque in either soft plaque or thrombus along the inferior margin of the arch. 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 Branching pattern of brachiocephalic vessels from the arch is normal without origin stenosis.  Right carotid system: Right common carotid artery widely patent to the bifurcation. Ordinary mild atherosclerotic disease at the carotid bifurcation without stenosis or irregularity. Cervical internal carotid artery widely patent.  Left carotid system: Left common carotid artery affected by diffuse atherosclerotic disease. Narrowing in the proximal portion of 40%. Vessel patent to the bifurcation with mild atherosclerotic change at the bifurcation but no stenosis or irregularity.  Vertebral arteries:Right vertebral artery dominant. Both vertebral artery origins are widely patent. Both vessels are patent through the cervical region to the foramen magnum.  Skeleton: Ordinary cervical spondylosis  Other neck: No significant soft tissue lesion.  CTA HEAD  Anterior circulation: Both carotid siphon regions widely patent. On the right, the anterior and middle cerebral vessels are patent. There is artifact related to previous MCA aneurysm clipping. I do not see any missing vessels on  the right compared to the previous study of 12/03/2014. On the left, pipeline stent is in place beginning at the M1 segment and extending into 1 of the left MCA branches. This spans the location of the fusiform aneurysm of the MCA branch, which measures 2.6 x 5.4 mm. No missing branch vessels are seen on the left. One could question if there is mild spasm of the MCA branch of medially beyond the and of the stent. Beyond that area, the vessel appears normal as it did before.  Posterior circulation: The right vertebral artery is a large vessel widely patent to the basilar. 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 The left vertebral artery is a small vessel that largely terminates in PICA. There is not definitely any flow beyond PICA to the basilar. The basilar artery shows mild atherosclerotic irregularity but  no flow-limiting stenosis. Superior cerebellar and posterior cerebral vessels are patent and normal.  Venous sinuses: Patent and normal.  Anatomic variants: No insignificant  Delayed phase: No significant finding  IMPRESSION: Pipeline stent placed in the left MCA spanning a left MCA aneurysm. No complications seen relative to that. No missing vessels. No hemorrhage. One could question mild spasm of the MCA branch just beyond the end of the stent, but the vessel is widely patent beyond that and appears as it did before.  Previously clipped right MCA region aneurysm. No missing vessels demonstrated on the right compared to the study of 12/03/2014.  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   Electronically Signed   By: Nelson Chimes M.D.   On: 01/20/2015 17:50   Ct Angio Neck W/cm &/or Wo/cm  01/20/2015   CLINICAL DATA:  Pipeline stent placed for left MCA aneurysm. Postoperative complaints of left arm and leg weakness.  EXAM: CT ANGIOGRAPHY HEAD AND NECK  TECHNIQUE: Multidetector CT imaging of the head and neck was performed using the standard protocol during bolus administration of intravenous contrast. Multiplanar CT image reconstructions and MIPs were obtained to evaluate the vascular anatomy. Carotid stenosis measurements (when applicable) are obtained utilizing NASCET criteria, using the distal internal carotid diameter as the denominator.  CONTRAST:  18mL OMNIPAQUE IOHEXOL 350 MG/ML SOLN  COMPARISON:  Angiography same day.  CTA 12/03/2014.  FINDINGS: CT HEAD  Brain: Previous core allowing upper right vertebral aneurysm. Previous clipping of the right MCA aneurysm. Placement of pipeline stent in the left MCA region today. No evidence of intracranial hemorrhage. 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 No sign of acute infarction by CT.  Calvarium and skull base: Postoperative changes.  Nothing a acute.  Paranasal sinuses: Clear  CTA NECK  Aortic arch: There  is advanced atherosclerotic disease of the arch with calcified plaque in either soft plaque or thrombus along the inferior margin of the arch. 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 Branching pattern of brachiocephalic vessels from the arch is normal without origin stenosis.  Right carotid system: Right common carotid artery widely patent to the bifurcation. Ordinary mild atherosclerotic disease at the carotid bifurcation without stenosis or irregularity. Cervical internal carotid artery widely patent.  Left carotid system: Left common carotid artery affected by diffuse atherosclerotic disease. Narrowing in the proximal portion of 40%. Vessel patent to the bifurcation with mild atherosclerotic change at the bifurcation but no stenosis or irregularity.  Vertebral arteries:Right vertebral artery dominant. Both vertebral artery origins are widely patent. Both vessels are patent  through the cervical region to the foramen magnum.  Skeleton: Ordinary cervical spondylosis  Other neck: No significant soft tissue lesion.  CTA HEAD  Anterior circulation: Both carotid siphon regions widely patent. On the right, the anterior and middle cerebral vessels are patent. There is artifact related to previous MCA aneurysm clipping. I do not see any missing vessels on the right compared to the previous study of 12/03/2014. On the left, pipeline stent is in place beginning at the M1 segment and extending into 1 of the left MCA branches. This spans the location of the fusiform aneurysm of the MCA branch, which measures 2.6 x 5.4 mm. No missing branch vessels are seen on the left. One could question if there is mild spasm of the MCA branch of medially beyond the and of the stent. Beyond that area, the vessel appears normal as it did before.  Posterior circulation: The right vertebral artery is a large vessel widely patent to the basilar. Previously coiled right vertebral aneurysm  without evidence of recanalization. The left vertebral artery is a small vessel that largely terminates in PICA. There is not definitely any flow beyond PICA to the basilar. The basilar artery shows mild atherosclerotic irregularity but no flow-limiting stenosis. Superior cerebellar and posterior cerebral vessels are patent and normal.  Venous sinuses: Patent and normal.  Anatomic variants: No insignificant  Delayed phase: No significant finding  IMPRESSION: Pipeline stent placed in the left MCA spanning a left MCA aneurysm. No complications seen relative to that. No missing vessels. No hemorrhage. One could question mild spasm of the MCA branch just beyond the end of the stent, but the vessel is widely patent beyond that and appears as it did before.  Previously clipped right MCA region aneurysm. No missing vessels demonstrated on the right compared to the study of 12/03/2014.  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   Electronically Signed   By: Nelson Chimes M.D.   On: 01/20/2015 17:50   Ir Transcath/emboliz  01/22/2015   CLINICAL DATA:  Progressive headaches. History of multiple intracranial aneurysms. 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  EXAM: TRANSCATHETER THERAPY EMBOLIZATION OF LEFT MIDDLE CEREBRAL ARTERY REGION ANEURYSM  ANESTHESIA/SEDATION: General anesthesia.  MEDICATIONS: As per general anesthesia.  CONTRAST:  159mL OMNIPAQUE IOHEXOL 300 MG/ML  SOLN  PROCEDURE: Following a full explanation of the procedure along with the potential associated complications, an informed witnessed consent was obtained.  The right groin was prepped and draped in the usual sterile fashion. Thereafter using modified Seldinger technique, transfemoral access into right common femoral artery was obtained  without difficulty. Over a 0.035 inch guidewire, a 5 French Pinnacle sheath was inserted. Through this, and also over a 0.035 inch guidewire, 5 French JB1 catheter was advanced to the aortic arch region and selectively positioned in the right common carotid artery, the right vertebral artery, and the left common carotid artery. The patient tolerated the procedure well. A 3D rotational arteriogram was also performed of the left intracranial circulation from a left-sided common carotid artery injection. Reconstructions were performed on a separate workstation.  COMPLICATIONS: None immediate.  FINDINGS: The right common carotid arteriogram demonstrates the right external carotid artery and its major branches to be widely patent.  The right internal carotid artery at the bulb and its proximal one-third is patent.  There is a modest kink at  the junction of the middle and the one-third of the right internal carotid artery without impedance of any blood flow distally.  More distally the vessel is seen to opacify normally to the cranial skull base.  The petrous, cavernous and supraclinoid segments are widely patent.  The right middle and the right anterior cerebral arteries are seen to opacify normally into the capillary and venous phases.  The right pericallosal artery is seen to cross the midline and supply the distal anterior cerebral artery distribution on the left.  Also seen are the previously positioned clips in the right middle cerebral artery distribution bifurcation. There is a mild fusiform prominence noted proximal to the clips at the inferior division of the right middle cerebral artery.  However, no angiographic change is noted from the previous catheter angiogram.  The vertebral artery origin is normal.  This is a dominant vertebral which it is seen to opacify to the cranial skull base. The previously endovascularly coiled right posterior-inferior cerebellar artery remains completely obliterated without  evidence of recanalization or of coil compaction.  The right posterior-inferior cerebellar artery remains widely patent.  The distal basilar artery, the posterior cerebral arteries, the superior cerebellar arteries and the anterior inferior cerebellar arteries opacify normally into the capillary and venous phases.  The left common carotid arteriogram demonstrates the left external carotid artery and its major branches to be normal.  The left internal carotid artery at the bulb to the cranial skull base opacifies normally.  The petrous, the cavernous and the supraclinoid segments are widely patent. The left middle cerebral artery and the left anterior cerebral artery are seen to opacify normally into the capillary and venous phases.  Arising in the region of the superior and the inferior divisions of the left middle cerebral artery again seen is a saccular outpouching projecting inferiorly and slightly anteriorly.  A 3D rotational arteriogram performed with reconstruction on a separate workstation confirms the presence of a wide neck lobulated aneurysm measuring approximately 5 mm x 4.5 mm. This is a wide neck which extends mostly into the origin of the inferior division.  ENDOVASCULAR STAGED EMBOLIZATION OF WIDE NECK LOBULATED LEFT MCA BIFURCATION ANEURYSM USING THE LVIS JR STENT.  The angiographic findings were reviewed with the patient and the patient's family. Informed consent was obtained. The patient was then put under general anesthesia by the Department of Anesthesiology at [facility] catheter in the left common carotid artery was then exchanged over a 0.035 inch 300 cm Constance Holster exchange guidewire for a 6 French 80 cm Cook shuttle sheath using biplane roadmap technique and constant fluoroscopic guidance. Good aspiration was obtained from the side port of the Tuohy-Borst at the hub of the [place] shuttle sheath. A gentle contrast injection demonstrated no evidence of spasms,  dissections or intraluminal filling defects at the distal end of the guide catheter in the left common carotid artery. This was then connected to continuous heparinized saline infusion.  A 6 French 115 cm Navien guide catheter was then advanced just distal to the tip of the [place] shuttle sheath in the distal left common carotid artery. The guidewire was removed. Good aspiration was then obtained from the hub of the 6 [place] Navien guide catheter. A gentle contrast injection revealed no evidence of spasms, dissections or of intraluminal filling defects.  Over a 0.035 inch Roadrunner guidewire, using biplane roadmap technique, the 6 [place] Navien guide catheter was then advanced to the distal cervical left ICA and positioned  at the cervical petrous junction. The guidewire was removed. Good aspiration was obtained from the hub of the 6 [place] Navien guide catheter.  At this time in a coaxial manner and with constant heparinized saline infusion using biplane roadmap technique and constant fluoroscopic guidance, over a 0.014 inch Softip Synchro micro guidewire, a Headway 17 2 tip micro catheter which had been steam-shaped was then advanced to the distal end of the 6 [place] Navien guide catheter. The micro guidewire was then gently manipulated using a torque device into the left middle cerebral artery M1 segment followed by the micro catheter. The micro guidewire was then advanced and positioned just proximal to the level of the aneurysm neck at the origin of the inferior division of the left middle cerebral artery. Multiple attempts were then made to advance the micro guidewire with different distal configurations into the inferior divisions. All of these were unsuccessful.  The micro catheter was then advanced to the M2 M3 region of the superior division of the left middle cerebral artery. The guidewire was removed. Good aspiration was obtained from the hub of the Headway micro catheter. This was then connected to  continuous heparinized saline infusion.  Through the second port of the Tuohy-Borst at the hub of the 6 [place] Navien guide catheter, an SL 10 2-tip micro catheter with a 45 degree angulation was then advanced over 0.0148 inch Softip Synchro micro guidewire to the distal end of the 6 [place] Navien guide catheter in the left internal carotid artery. With the micro guidewire leading with a J-tip configuration, the combination was then advanced to the left middle cerebral artery M1 segment. The micro guidewire was then advanced into the aneurysm followed by the SL 10 45 degrees angle micro catheter. The micro guidewire was advanced within the aneurysm under constant fluoroscopic guidance gingerly such that the tip of the wire was now pointing into the origin of the inferior division of the left middle cerebral artery. This was then followed by the micro catheter. The micro guidewire was then gently manipulated with access obtained into the inferior division. The wire was then advanced distally into the inferior division followed by the SL 10 micro catheter carefully. There was free advancement of the micro catheter to the M3 region of the left middle cerebral artery inferior division. The micro guidewire was then removed. Good aspiration was obtained from the hub of the SL 10 micro catheter. A gentle contrast injection demonstrated antegrade flow distally. This was then connected to continuous heparinized saline infusion.  At this time a 2.5 mm x 34 mm LVIS Jr stent with a working length of 30 mm was then advanced in a coaxial manner and with constant heparinized saline infusion using biplane roadmap technique and constant fluoroscopic guidance to the distal end of the SL 10 micro catheter. The O ring on the delivery micro guidewire and the delivery micro catheter were then gently loosened. With slight forward gentle traction with the right hand on the delivery micro guidewire, with the left hand the SL 10 micro  catheter was gently retrieved in a controlled gradual slow fashion unsheathing the LVIS stent.  This was continued until the entire stent was deployed. A control arteriogram performed through the 6 [place] Navien guide catheter demonstrated excellent apposition in the left middle cerebral artery M1 segment and the M2 region of the inferior division of the left middle cerebral artery. However, there was significant narrowing noted in the stent just distal to its entry into the inferior  division.  This was then subsequently dilated with a 0.012 inch J-tipped Headliner micro guidewire which was advanced to the focal narrowing within the stent followed by the micro catheter. The micro guidewire was then gingerly advanced to and fro at the site of the superior stenosis. The wire was then retrieved proximally as was the micro catheter. A control arteriogram performed through the 6 [place] Navien guide catheter demonstrated significantly improved caliber of the stent at this point with excellent flow into the vessels.  Control arteriograms were then performed at 15, 30 and 40 minutes post stent deployment. These continued to demonstrate excellent flow. There was suspicion of a small filling defect along the distal aspect of the LVIS stent. This prompted the use of approximately 4 mg of superselective intracranial Integrelin over about 4 minutes.  Control arteriograms continued to demonstrate excellent flow with opening of the LVIS stent within the aneurysm itself.  A final control arteriogram performed through the 6 [place] Navien guide catheter continued to demonstrate excellent flow without any intraluminal filling defects.  This was then retrieved into the abdominal aorta along with the [place] shuttle sheath. This was then exchanged out for a 6 [place] Pinnacle sheath over a 0.035 inch guidewire. The sheath itself was then removed with the successful application of an external closure device.  The patient's ACT  throughout the procedure was maintained in the region of 180 seconds.  No acute neurological or hemodynamic changes were seen throughout the procedure.  The patient's general anesthesia was then reversed and the patient was extubated. Upon recovery the patient demonstrated no new neurological signs or symptoms. Memory and orientation remained stable as did her motor, sensory and coordination function. She was then transferred to the neuro ICU to continue her IV heparin and to control her blood pressure on IV Cardene within the parameters.  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   Electronically Signed   By: Luanne Bras M.D.   On: 01/21/2015 13:30   Ir Angiogram Follow Up Study  01/22/2015   CLINICAL DATA:  Progressive headaches. History of multiple intracranial aneurysms. 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  EXAM: TRANSCATHETER THERAPY EMBOLIZATION OF LEFT MIDDLE CEREBRAL ARTERY REGION ANEURYSM  ANESTHESIA/SEDATION: General anesthesia.  MEDICATIONS: As per general anesthesia.  CONTRAST:  15mL OMNIPAQUE IOHEXOL 300 MG/ML  SOLN  PROCEDURE: Following a full explanation of the procedure along with the potential associated complications, an informed witnessed consent was obtained.  The right groin was prepped and draped in the usual sterile fashion. Thereafter using modified Seldinger technique, transfemoral access into right common femoral artery was obtained without difficulty. Over a 0.035 inch guidewire, a 5 French Pinnacle sheath was inserted. Through this, and also over a 0.035 inch guidewire, 5 French JB1 catheter was advanced to the aortic arch region and  selectively positioned in the right common carotid artery, the right vertebral artery, and the left common carotid artery. The patient tolerated the procedure well. A 3D rotational arteriogram was also performed of the left intracranial circulation from a left-sided common carotid artery injection. Reconstructions were performed on a separate workstation.  COMPLICATIONS: None immediate.  FINDINGS: The right common carotid arteriogram demonstrates the right external carotid artery and its major branches to  be widely patent.  The right internal carotid artery at the bulb and its proximal one-third is patent.  There is a modest kink at the junction of the middle and the one-third of the right internal carotid artery without impedance of any blood flow distally.  More distally the vessel is seen to opacify normally to the cranial skull base.  The petrous, cavernous and supraclinoid segments are widely patent.  The right middle and the right anterior cerebral arteries are seen to opacify normally into the capillary and venous phases.  The right pericallosal artery is seen to cross the midline and supply the distal anterior cerebral artery distribution on the left.  Also seen are the previously positioned clips in the right middle cerebral artery distribution bifurcation. There is a mild fusiform prominence noted proximal to the clips at the inferior division of the right middle cerebral artery.  However, no angiographic change is noted from the previous catheter angiogram.  The vertebral artery origin is normal.  This is a dominant vertebral which it is seen to opacify to the cranial skull base. The previously endovascularly coiled right posterior-inferior cerebellar artery remains completely obliterated without evidence of recanalization or of coil compaction.  The right posterior-inferior cerebellar artery remains widely patent.  The distal basilar artery, the posterior cerebral arteries, the superior cerebellar  arteries and the anterior inferior cerebellar arteries opacify normally into the capillary and venous phases.  The left common carotid arteriogram demonstrates the left external carotid artery and its major branches to be normal.  The left internal carotid artery at the bulb to the cranial skull base opacifies normally.  The petrous, the cavernous and the supraclinoid segments are widely patent. The left middle cerebral artery and the left anterior cerebral artery are seen to opacify normally into the capillary and venous phases.  Arising in the region of the superior and the inferior divisions of the left middle cerebral artery again seen is a saccular outpouching projecting inferiorly and slightly anteriorly.  A 3D rotational arteriogram performed with reconstruction on a separate workstation confirms the presence of a wide neck lobulated aneurysm measuring approximately 5 mm x 4.5 mm. This is a wide neck which extends mostly into the origin of the inferior division.  ENDOVASCULAR STAGED EMBOLIZATION OF WIDE NECK LOBULATED LEFT MCA BIFURCATION ANEURYSM USING THE LVIS JR STENT.  The angiographic findings were reviewed with the patient and the patient's family. Informed consent was obtained. The patient was then put under general anesthesia by the Department of Anesthesiology at [place] catheter in the left common carotid artery was then exchanged over a 0.035 inch 300 cm Constance Holster exchange guidewire for a 6 French 80 cm Cook shuttle sheath using biplane roadmap technique and constant fluoroscopic guidance. Good aspiration was obtained from the side port of the Tuohy-Borst at the hub of the [place] shuttle sheath. A gentle contrast injection demonstrated no evidence of spasms, dissections or intraluminal filling defects at the distal end of the guide catheter in the left common carotid artery. This was then connected to continuous heparinized saline infusion.  A 6 French 115 cm  Navien guide catheter was then advanced just distal to the tip of the [place] shuttle sheath in the distal left common carotid artery. The guidewire was removed. Good aspiration was then obtained from the hub of the 6 [place] Navien guide catheter. A gentle contrast injection revealed no evidence of spasms, dissections or of intraluminal filling defects.  Over a  0.035 inch Roadrunner guidewire, using biplane roadmap technique, the 6 [place] Navien guide catheter was then advanced to the distal cervical left ICA and positioned at the cervical petrous junction. The guidewire was removed. Good aspiration was obtained from the hub of the 6 [place] Navien guide catheter.  At this time in a coaxial manner and with constant heparinized saline infusion using biplane roadmap technique and constant fluoroscopic guidance, over a 0.014 inch Softip Synchro micro guidewire, a Headway 17 2 tip micro catheter which had been steam-shaped was then advanced to the distal end of the 6 [place] Navien guide catheter. The micro guidewire was then gently manipulated using a torque device into the left middle cerebral artery M1 segment followed by the micro catheter. The micro guidewire was then advanced and positioned just proximal to the level of the aneurysm neck at the origin of the inferior division of the left middle cerebral artery. Multiple attempts were then made to advance the micro guidewire with different distal configurations into the inferior divisions. All of these were unsuccessful.  The micro catheter was then advanced to the M2 M3 region of the superior division of the left middle cerebral artery. The guidewire was removed. Good aspiration was obtained from the hub of the Headway micro catheter. This was then connected to continuous heparinized saline infusion.  Through the second port of the Tuohy-Borst at the hub of the 6 [place] Navien guide catheter, an SL 10 2-tip micro catheter with a 45 degree angulation was then advanced  over 0.0148 inch Softip Synchro micro guidewire to the distal end of the 6 [place] Navien guide catheter in the left internal carotid artery. With the micro guidewire leading with a J-tip configuration, the combination was then advanced to the left middle cerebral artery M1 segment. The micro guidewire was then advanced into the aneurysm followed by the SL 10 45 degrees angle micro catheter. The micro guidewire was advanced within the aneurysm under constant fluoroscopic guidance gingerly such that the tip of the wire was now pointing into the origin of the inferior division of the left middle cerebral artery. This was then followed by the micro catheter. The micro guidewire was then gently manipulated with access obtained into the inferior division. The wire was then advanced distally into the inferior division followed by the SL 10 micro catheter carefully. There was free advancement of the micro catheter to the M3 region of the left middle cerebral artery inferior division. The micro guidewire was then removed. Good aspiration was obtained from the hub of the SL 10 micro catheter. A gentle contrast injection demonstrated antegrade flow distally. This was then connected to continuous heparinized saline infusion.  At this time a 2.5 mm x 34 mm LVIS Jr stent with a working length of 30 mm was then advanced in a coaxial manner and with constant heparinized saline infusion using biplane roadmap technique and constant fluoroscopic guidance to the distal end of the SL 10 micro catheter. The O ring on the delivery micro guidewire and the delivery micro catheter were then gently loosened. With slight forward gentle traction with the right hand on the delivery micro guidewire, with the left hand the SL 10 micro catheter was gently retrieved in a controlled gradual slow fashion unsheathing the LVIS stent.  This was continued until the entire stent was deployed. A control arteriogram performed through the 6 [place] Navien  guide catheter demonstrated excellent apposition in the left middle cerebral artery M1 segment and the M2 region of the  inferior division of the left middle cerebral artery. However, there was significant narrowing noted in the stent just distal to its entry into the inferior division.  This was then subsequently dilated with a 0.012 inch J-tipped Headliner micro guidewire which was advanced to the focal narrowing within the stent followed by the micro catheter. The micro guidewire was then gingerly advanced to and fro at the site of the superior stenosis. The wire was then retrieved proximally as was the micro catheter. A control arteriogram performed through the 6 [place] Navien guide catheter demonstrated significantly improved caliber of the stent at this point with excellent flow into the vessels.  Control arteriograms were then performed at 15, 30 and 40 minutes post stent deployment. These continued to demonstrate excellent flow. There was suspicion of a small filling defect along the distal aspect of the LVIS stent. This prompted the use of approximately 4 mg of superselective intracranial Integrelin over about 4 minutes.  Control arteriograms continued to demonstrate excellent flow with opening of the LVIS stent within the aneurysm itself.  A final control arteriogram performed through the 6 [place] Navien guide catheter continued to demonstrate excellent flow without any intraluminal filling defects.  This was then retrieved into the abdominal aorta along with the [place] shuttle sheath. This was then exchanged out for a 6 [place] Pinnacle sheath over a 0.035 inch guidewire. The sheath itself was then removed with the successful application of an external closure device.  The patient's ACT throughout the procedure was maintained in the region of 180 seconds.  No acute neurological or hemodynamic changes were seen throughout the procedure.  The patient's general anesthesia was then reversed and the  patient was extubated. Upon recovery the patient demonstrated no new neurological signs or symptoms. Memory and orientation remained stable as did her motor, sensory and coordination function. She was then transferred to the neuro ICU to continue her IV heparin and to control her blood pressure on IV Cardene within the parameters.  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   Electronically Signed   By: Luanne Bras M.D.   On: 01/21/2015 13:30   Ir 3d Primitivo Gauze Darreld Mclean  01/22/2015   CLINICAL DATA:  Progressive headaches. History of multiple intracranial aneurysms. 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  EXAM: TRANSCATHETER THERAPY EMBOLIZATION OF LEFT MIDDLE CEREBRAL ARTERY REGION ANEURYSM  ANESTHESIA/SEDATION: General anesthesia.  MEDICATIONS: As per general anesthesia.  CONTRAST:  148mL OMNIPAQUE IOHEXOL 300 MG/ML  SOLN  PROCEDURE: Following a full explanation of the procedure along with the potential associated complications, an informed witnessed consent was obtained.  The right groin was prepped and draped in the usual sterile fashion. Thereafter using modified Seldinger technique, transfemoral access into right common femoral artery was obtained without difficulty. Over a 0.035 inch guidewire, a 5 French Pinnacle sheath was inserted. Through this, and also over a 0.035 inch guidewire, 5 French JB1 catheter was advanced to the aortic arch region and selectively positioned in the right common carotid artery, the right vertebral artery, and the left common carotid artery. The patient tolerated the procedure well. A 3D rotational arteriogram was also performed of the left  intracranial circulation from a left-sided common carotid artery injection. Reconstructions were performed on a  separate workstation.  COMPLICATIONS: None immediate.  FINDINGS: The right common carotid arteriogram demonstrates the right external carotid artery and its major branches to be widely patent.  The right internal carotid artery at the bulb and its proximal one-third is patent.  There is a modest kink at the junction of the middle and the one-third of the right internal carotid artery without impedance of any blood flow distally.  More distally the vessel is seen to opacify normally to the cranial skull base.  The petrous, cavernous and supraclinoid segments are widely patent.  The right middle and the right anterior cerebral arteries are seen to opacify normally into the capillary and venous phases.  The right pericallosal artery is seen to cross the midline and supply the distal anterior cerebral artery distribution on the left.  Also seen are the previously positioned clips in the right middle cerebral artery distribution bifurcation. There is a mild fusiform prominence noted proximal to the clips at the inferior division of the right middle cerebral artery.  However, no angiographic change is noted from the previous catheter angiogram.  The vertebral artery origin is normal.  This is a dominant vertebral which it is seen to opacify to the cranial skull base. The previously endovascularly coiled right posterior-inferior cerebellar artery remains completely obliterated without evidence of recanalization or of coil compaction.  The right posterior-inferior cerebellar artery remains widely patent.  The distal basilar artery, the posterior cerebral arteries, the superior cerebellar arteries and the anterior inferior cerebellar arteries opacify normally into the capillary and venous phases.  The left common carotid arteriogram demonstrates the left external carotid artery and its major branches to be normal.   The left internal carotid artery at the bulb to the cranial skull base opacifies normally.  The petrous, the cavernous and the supraclinoid segments are widely patent. The left middle cerebral artery and the left anterior cerebral artery are seen to opacify normally into the capillary and venous phases.  Arising in the region of the superior and the inferior divisions of the left middle cerebral artery again seen is a saccular outpouching projecting inferiorly and slightly anteriorly.  A 3D rotational arteriogram performed with reconstruction on a separate workstation confirms the presence of a wide neck lobulated aneurysm measuring approximately 5 mm x 4.5 mm. This is a wide neck which extends mostly into the origin of the inferior division.  ENDOVASCULAR STAGED EMBOLIZATION OF WIDE NECK LOBULATED LEFT MCA BIFURCATION ANEURYSM USING THE LVIS JR STENT.  The angiographic findings were reviewed with the patient and the patient's family. Informed consent was obtained. The patient was then put under general anesthesia by the Department of Anesthesiology at [place] catheter in the left common carotid artery was then exchanged over a 0.035 inch 300 cm Constance Holster exchange guidewire for a 6 French 80 cm [place] shuttle sheath using biplane roadmap technique and constant fluoroscopic guidance. Good aspiration was obtained from the side port of the Tuohy-Borst at the hub of the [place] shuttle sheath. A gentle contrast injection demonstrated no evidence of spasms, dissections or intraluminal filling defects at the distal end of the guide catheter in the left common carotid artery. This was then connected to continuous heparinized saline infusion.  A 6 French 115 cm Navien guide catheter was then advanced just distal to the tip of the [place] shuttle sheath in the distal left common carotid artery. The guidewire was removed. Good aspiration was then obtained from the hub of the  6 [place] Navien guide  catheter. A gentle contrast injection revealed no evidence of spasms, dissections or of intraluminal filling defects.  Over a 0.035 inch Roadrunner guidewire, using biplane roadmap technique, the 6 [place] Navien guide catheter was then advanced to the distal cervical left ICA and positioned at the cervical petrous junction. The guidewire was removed. Good aspiration was obtained from the hub of the 6 [place] Navien guide catheter.  At this time in a coaxial manner and with constant heparinized saline infusion using biplane roadmap technique and constant fluoroscopic guidance, over a 0.014 inch Softip Synchro micro guidewire, a Headway 17 2 tip micro catheter which had been steam-shaped was then advanced to the distal end of the 6 [place] Navien guide catheter. The micro guidewire was then gently manipulated using a torque device into the left middle cerebral artery M1 segment followed by the micro catheter. The micro guidewire was then advanced and positioned just proximal to the level of the aneurysm neck at the origin of the inferior division of the left middle cerebral artery. Multiple attempts were then made to advance the micro guidewire with different distal configurations into the inferior divisions. All of these were unsuccessful.  The micro catheter was then advanced to the M2 M3 region of the superior division of the left middle cerebral artery. The guidewire was removed. Good aspiration was obtained from the hub of the Headway micro catheter. This was then connected to continuous heparinized saline infusion.  Through the second port of the Tuohy-Borst at the hub of the 6 [place] Navien guide catheter, an SL 10 2-tip micro catheter with a 45 degree angulation was then advanced over 0.0148 inch Softip Synchro micro guidewire to the distal end of the 6 [place] Navien guide catheter in the left internal carotid artery. With the micro guidewire leading with a J-tip configuration, the combination was then advanced  to the left middle cerebral artery M1 segment. The micro guidewire was then advanced into the aneurysm followed by the SL 10 45 degrees angle micro catheter. The micro guidewire was advanced within the aneurysm under constant fluoroscopic guidance gingerly such that the tip of the wire was now pointing into the origin of the inferior division of the left middle cerebral artery. This was then followed by the micro catheter. The micro guidewire was then gently manipulated with access obtained into the inferior division. The wire was then advanced distally into the inferior division followed by the SL 10 micro catheter carefully. There was free advancement of the micro catheter to the M3 region of the left middle cerebral artery inferior division. The micro guidewire was then removed. Good aspiration was obtained from the hub of the SL 10 micro catheter. A gentle contrast injection demonstrated antegrade flow distally. This was then connected to continuous heparinized saline infusion.  At this time a 2.5 mm x 34 mm LVIS Jr stent with a working length of 30 mm was then advanced in a coaxial manner and with constant heparinized saline infusion using biplane roadmap technique and constant fluoroscopic guidance to the distal end of the SL 10 micro catheter. The O ring on the delivery micro guidewire and the delivery micro catheter were then gently loosened. With slight forward gentle traction with the right hand on the delivery micro guidewire, with the left hand the SL 10 micro catheter was gently retrieved in a controlled gradual slow fashion unsheathing the LVIS stent.  This was continued until the entire stent was deployed. A control arteriogram performed through  the 6 [place] Navien guide catheter demonstrated excellent apposition in the left middle cerebral artery M1 segment and the M2 region of the inferior division of the left middle cerebral artery. However, there was significant narrowing noted in the stent just  distal to its entry into the inferior division.  This was then subsequently dilated with a 0.012 inch J-tipped Headliner micro guidewire which was advanced to the focal narrowing within the stent followed by the micro catheter. The micro guidewire was then gingerly advanced to and fro at the site of the superior stenosis. The wire was then retrieved proximally as was the micro catheter. A control arteriogram performed through the 6 [place] Navien guide catheter demonstrated significantly improved caliber of the stent at this point with excellent flow into the vessels.  Control arteriograms were then performed at 15, 30 and 40 minutes post stent deployment. These continued to demonstrate excellent flow. There was suspicion of a small filling defect along the distal aspect of the LVIS stent. This prompted the use of approximately 4 mg of superselective intracranial Integrelin over about 4 minutes.  Control arteriograms continued to demonstrate excellent flow with opening of the LVIS stent within the aneurysm itself.  A final control arteriogram performed through the 6 [place] Navien guide catheter continued to demonstrate excellent flow without any intraluminal filling defects.  This was then retrieved into the abdominal aorta along with the [place] shuttle sheath. This was then exchanged out for a 6 [place] Pinnacle sheath over a 0.035 inch guidewire. The sheath itself was then removed with the successful application of an external closure device.  The patient's ACT throughout the procedure was maintained in the region of 180 seconds.  No acute neurological or hemodynamic changes were seen throughout the procedure.  The patient's general anesthesia was then reversed and the patient was extubated. Upon recovery the patient demonstrated no new neurological signs or symptoms. Memory and orientation remained stable as did her motor, sensory and coordination function. She was then transferred to the neuro ICU to  continue her IV heparin and to control her blood pressure on IV Cardene within the parameters.  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   Electronically Signed   By: Luanne Bras M.D.   On: 01/21/2015 13:30   Ir US Guide Vasc Access Right  01/22/2015   CLINICAL DATA:  Progressive headaches. History of multiple intracranial aneurysms. 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  EXAM: TRANSCATHETER THERAPY EMBOLIZATION OF LEFT MIDDLE CEREBRAL ARTERY REGION ANEURYSM  ANESTHESIA/SEDATION: General anesthesia.  MEDICATIONS: As per general anesthesia.  CONTRAST:  161mL OMNIPAQUE IOHEXOL 300 MG/ML  SOLN  PROCEDURE: Following a full explanation of the procedure along with the potential associated complications, an informed witnessed consent was obtained.  The right groin was prepped and draped in the usual sterile fashion. Thereafter using modified Seldinger technique, transfemoral access into right common femoral artery was obtained without difficulty. Over a 0.035 inch guidewire, a 5 French Pinnacle sheath was inserted. Through this, and also over a 0.035 inch guidewire, 5 French JB1 catheter was advanced to the aortic arch region and selectively positioned in the right common carotid artery, the right vertebral artery, and the left common carotid artery. The patient tolerated the procedure  well. A 3D rotational arteriogram was also performed of the left intracranial circulation from a left-sided common carotid artery injection. Reconstructions were performed on a separate workstation.  COMPLICATIONS: None immediate.  FINDINGS: The right common carotid arteriogram demonstrates the  right external carotid artery and its major branches to be widely patent.  The right internal carotid artery at the bulb and its proximal one-third is patent.  There is a modest kink at the junction of the middle and the one-third of the right internal carotid artery without impedance of any blood flow distally.  More distally the vessel is seen to opacify normally to the cranial skull base.  The petrous, cavernous and supraclinoid segments are widely patent.  The right middle and the right anterior cerebral arteries are seen to opacify normally into the capillary and venous phases.  The right pericallosal artery is seen to cross the midline and supply the distal anterior cerebral artery distribution on the left.  Also seen are the previously positioned clips in the right middle cerebral artery distribution bifurcation. There is a mild fusiform prominence noted proximal to the clips at the inferior division of the right middle cerebral artery.  However, no angiographic change is noted from the previous catheter angiogram.  The vertebral artery origin is normal.  This is a dominant vertebral which it is seen to opacify to the cranial skull base. The previously endovascularly coiled right posterior-inferior cerebellar artery remains completely obliterated without evidence of recanalization or of coil compaction.  The right posterior-inferior cerebellar artery remains widely patent.  The distal basilar artery, the posterior cerebral arteries, the superior cerebellar arteries and the anterior inferior cerebellar arteries opacify normally into the capillary and venous phases.  The left common carotid arteriogram demonstrates the left external carotid artery and its major branches to be normal.  The left internal carotid artery at the bulb to the cranial skull base opacifies normally.  The petrous, the cavernous and the supraclinoid segments are widely patent. The left middle cerebral artery and the left anterior cerebral  artery are seen to opacify normally into the capillary and venous phases.  Arising in the region of the superior and the inferior divisions of the left middle cerebral artery again seen is a saccular outpouching projecting inferiorly and slightly anteriorly.  A 3D rotational arteriogram performed with reconstruction on a separate workstation confirms the presence of a wide neck lobulated aneurysm measuring approximately 5 mm x 4.5 mm. This is a wide neck which extends mostly into the origin of the inferior division.  ENDOVASCULAR STAGED EMBOLIZATION OF WIDE NECK LOBULATED LEFT MCA BIFURCATION ANEURYSM USING THE LVIS JR STENT.  The angiographic findings were reviewed with the patient and the patient's family. Informed consent was obtained. The patient was then put under general anesthesia by the Department of Anesthesiology at [place] catheter in the left common carotid artery was then exchanged over a 0.035 inch 300 cm Constance Holster exchange guidewire for a 6 French 80 cm [place] shuttle sheath using biplane roadmap technique and constant fluoroscopic guidance. Good aspiration was obtained from the side port of the Tuohy-Borst at the hub of the [place] shuttle sheath. A gentle contrast injection demonstrated no evidence of spasms, dissections or intraluminal filling defects at the distal end of the guide catheter in the left common carotid artery. This was then connected to continuous heparinized saline infusion.  A 6 French 115 cm Navien guide catheter was then advanced just distal to the tip  of the [facility] shuttle sheath in the distal left common carotid artery. The guidewire was removed. Good aspiration was then obtained from the hub of the 6 [place] Navien guide catheter. A gentle contrast injection revealed no evidence of spasms, dissections or of intraluminal filling defects.  Over a 0.035 inch Roadrunner guidewire, using biplane roadmap technique, the 6 [place] Navien guide catheter was  then advanced to the distal cervical left ICA and positioned at the cervical petrous junction. The guidewire was removed. Good aspiration was obtained from the hub of the 6 [place] Navien guide catheter.  At this time in a coaxial manner and with constant heparinized saline infusion using biplane roadmap technique and constant fluoroscopic guidance, over a 0.014 inch Softip Synchro micro guidewire, a Headway 17 2 tip micro catheter which had been steam-shaped was then advanced to the distal end of the 6 [place] Navien guide catheter. The micro guidewire was then gently manipulated using a torque device into the left middle cerebral artery M1 segment followed by the micro catheter. The micro guidewire was then advanced and positioned just proximal to the level of the aneurysm neck at the origin of the inferior division of the left middle cerebral artery. Multiple attempts were then made to advance the micro guidewire with different distal configurations into the inferior divisions. All of these were unsuccessful.  The micro catheter was then advanced to the M2 M3 region of the superior division of the left middle cerebral artery. The guidewire was removed. Good aspiration was obtained from the hub of the Headway micro catheter. This was then connected to continuous heparinized saline infusion.  Through the second port of the Tuohy-Borst at the hub of the 6 [place] Navien guide catheter, an SL 10 2-tip micro catheter with a 45 degree angulation was then advanced over 0.0148 inch Softip Synchro micro guidewire to the distal end of the 6 [place] Navien guide catheter in the left internal carotid artery. With the micro guidewire leading with a J-tip configuration, the combination was then advanced to the left middle cerebral artery M1 segment. The micro guidewire was then advanced into the aneurysm followed by the SL 10 45 degrees angle micro catheter. The micro guidewire was advanced within the aneurysm under constant  fluoroscopic guidance gingerly such that the tip of the wire was now pointing into the origin of the inferior division of the left middle cerebral artery. This was then followed by the micro catheter. The micro guidewire was then gently manipulated with access obtained into the inferior division. The wire was then advanced distally into the inferior division followed by the SL 10 micro catheter carefully. There was free advancement of the micro catheter to the M3 region of the left middle cerebral artery inferior division. The micro guidewire was then removed. Good aspiration was obtained from the hub of the SL 10 micro catheter. A gentle contrast injection demonstrated antegrade flow distally. This was then connected to continuous heparinized saline infusion.  At this time a 2.5 mm x 34 mm LVIS Jr stent with a working length of 30 mm was then advanced in a coaxial manner and with constant heparinized saline infusion using biplane roadmap technique and constant fluoroscopic guidance to the distal end of the SL 10 micro catheter. The O ring on the delivery micro guidewire and the delivery micro catheter were then gently loosened. With slight forward gentle traction with the right hand on the delivery micro guidewire, with the left hand the SL 10 micro catheter was gently  retrieved in a controlled gradual slow fashion unsheathing the LVIS stent.  This was continued until the entire stent was deployed. A control arteriogram performed through the 6 [place] Navien guide catheter demonstrated excellent apposition in the left middle cerebral artery M1 segment and the M2 region of the inferior division of the left middle cerebral artery. However, there was significant narrowing noted in the stent just distal to its entry into the inferior division.  This was then subsequently dilated with a 0.012 inch J-tipped Headliner micro guidewire which was advanced to the focal narrowing within the stent followed by the micro catheter.  The micro guidewire was then gingerly advanced to and fro at the site of the superior stenosis. The wire was then retrieved proximally as was the micro catheter. A control arteriogram performed through the 6 [place] Navien guide catheter demonstrated significantly improved caliber of the stent at this point with excellent flow into the vessels.  Control arteriograms were then performed at 15, 30 and 40 minutes post stent deployment. These continued to demonstrate excellent flow. There was suspicion of a small filling defect along the distal aspect of the LVIS stent. This prompted the use of approximately 4 mg of superselective intracranial Integrelin over about 4 minutes.  Control arteriograms continued to demonstrate excellent flow with opening of the LVIS stent within the aneurysm itself.  A final control arteriogram performed through the 6 [place] Navien guide catheter continued to demonstrate excellent flow without any intraluminal filling defects.  This was then retrieved into the abdominal aorta along with the [place] shuttle sheath. This was then exchanged out for a 6 [place] Pinnacle sheath over a 0.035 inch guidewire. The sheath itself was then removed with the successful application of an external closure device.  The patient's ACT throughout the procedure was maintained in the region of 180 seconds.  No acute neurological or hemodynamic changes were seen throughout the procedure.  The patient's general anesthesia was then reversed and the patient was extubated. Upon recovery the patient demonstrated no new neurological signs or symptoms. Memory and orientation remained stable as did her motor, sensory and coordination function. She was then transferred to the neuro ICU to continue her IV heparin and to control her blood pressure on IV Cardene within the parameters.  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   Electronically Signed   By: Luanne Bras M.D.   On: 01/21/2015 13:30   Ir Angio Intra Extracran Sel Com Carotid Innominate Uni R Mod Sed  01/22/2015   CLINICAL DATA:  Progressive headaches. History of multiple intracranial aneurysms. 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  EXAM: TRANSCATHETER THERAPY EMBOLIZATION OF LEFT MIDDLE CEREBRAL ARTERY REGION ANEURYSM  ANESTHESIA/SEDATION: General anesthesia.  MEDICATIONS: As per general anesthesia.  CONTRAST:  116mL OMNIPAQUE IOHEXOL 300 MG/ML  SOLN  PROCEDURE: Following a full explanation of the procedure along with the potential associated complications, an informed witnessed consent was obtained.  The right groin was prepped and draped in the usual sterile fashion. Thereafter using modified Seldinger technique, transfemoral access into right common femoral artery was obtained without difficulty. Over a 0.035 inch guidewire, a 5 French Pinnacle sheath was inserted. Through this, and also over a 0.035 inch guidewire, 5 [place] JB1  catheter was advanced to the aortic arch region and selectively positioned in the right common carotid artery, the right vertebral artery, and the left common carotid artery. The patient tolerated the procedure well. A 3D rotational arteriogram was also performed of the left intracranial circulation from a left-sided common carotid artery injection. Reconstructions were performed on a separate workstation.  COMPLICATIONS: None immediate.  FINDINGS: The right common carotid arteriogram demonstrates the right external carotid artery and its major branches to be widely patent.  The right internal carotid artery at the bulb and its proximal one-third is patent.  There is a modest kink at the junction of  the middle and the one-third of the right internal carotid artery without impedance of any blood flow distally.  More distally the vessel is seen to opacify normally to the cranial skull base.  The petrous, cavernous and supraclinoid segments are widely patent.  The right middle and the right anterior cerebral arteries are seen to opacify normally into the capillary and venous phases.  The right pericallosal artery is seen to cross the midline and supply the distal anterior cerebral artery distribution on the left.  Also seen are the previously positioned clips in the right middle cerebral artery distribution bifurcation. There is a mild fusiform prominence noted proximal to the clips at the inferior division of the right middle cerebral artery.  However, no angiographic change is noted from the previous catheter angiogram.  The vertebral artery origin is normal.  This is a dominant vertebral which it is seen to opacify to the cranial skull base. The previously endovascularly coiled right posterior-inferior cerebellar artery remains completely obliterated without evidence of recanalization or of coil compaction.  The right posterior-inferior cerebellar artery remains widely patent.  The distal basilar artery, the posterior cerebral arteries, the superior cerebellar arteries and the anterior inferior cerebellar arteries opacify normally into the capillary and venous phases.  The left common carotid arteriogram demonstrates the left external carotid artery and its major branches to be normal.  The left internal carotid artery at the bulb to the cranial skull base opacifies normally.  The petrous, the cavernous and the supraclinoid segments are widely patent. The left middle cerebral artery and the left anterior cerebral artery are seen to opacify normally into the capillary and venous phases.  Arising in the region of the superior and the inferior divisions of the left middle cerebral artery again seen is a saccular  outpouching projecting inferiorly and slightly anteriorly.  A 3D rotational arteriogram performed with reconstruction on a separate workstation confirms the presence of a wide neck lobulated aneurysm measuring approximately 5 mm x 4.5 mm. This is a wide neck which extends mostly into the origin of the inferior division.  ENDOVASCULAR STAGED EMBOLIZATION OF WIDE NECK LOBULATED LEFT MCA BIFURCATION ANEURYSM USING THE LVIS JR STENT.  The angiographic findings were reviewed with the patient and the patient's family. Informed consent was obtained. The patient was then put under general anesthesia by the Department of Anesthesiology at [place] catheter in the left common carotid artery was then exchanged over a 0.035 inch 300 cm Constance Holster exchange guidewire for a 6 French 80 cm [place] shuttle sheath using biplane roadmap technique and constant fluoroscopic guidance. Good aspiration was obtained from the side port of the Tuohy-Borst at the hub of the [place] shuttle sheath. A gentle contrast injection demonstrated no evidence of spasms, dissections or intraluminal filling defects at the distal end of the guide catheter  in the left common carotid artery. This was then connected to continuous heparinized saline infusion.  A 6 French 115 cm Navien guide catheter was then advanced just distal to the tip of the [place] shuttle sheath in the distal left common carotid artery. The guidewire was removed. Good aspiration was then obtained from the hub of the 6 [place] Navien guide catheter. A gentle contrast injection revealed no evidence of spasms, dissections or of intraluminal filling defects.  Over a 0.035 inch Roadrunner guidewire, using biplane roadmap technique, the 6 [place] Navien guide catheter was then advanced to the distal cervical left ICA and positioned at the cervical petrous junction. The guidewire was removed. Good aspiration was obtained from the hub of the 6 [place] Navien guide  catheter.  At this time in a coaxial manner and with constant heparinized saline infusion using biplane roadmap technique and constant fluoroscopic guidance, over a 0.014 inch Softip Synchro micro guidewire, a Headway 17 2 tip micro catheter which had been steam-shaped was then advanced to the distal end of the 6 [place] Navien guide catheter. The micro guidewire was then gently manipulated using a torque device into the left middle cerebral artery M1 segment followed by the micro catheter. The micro guidewire was then advanced and positioned just proximal to the level of the aneurysm neck at the origin of the inferior division of the left middle cerebral artery. Multiple attempts were then made to advance the micro guidewire with different distal configurations into the inferior divisions. All of these were unsuccessful.  The micro catheter was then advanced to the M2 M3 region of the superior division of the left middle cerebral artery. The guidewire was removed. Good aspiration was obtained from the hub of the Headway micro catheter. This was then connected to continuous heparinized saline infusion.  Through the second port of the Tuohy-Borst at the hub of the 6 [place] Navien guide catheter, an SL 10 2-tip micro catheter with a 45 degree angulation was then advanced over 0.0148 inch Softip Synchro micro guidewire to the distal end of the 6 [place] Navien guide catheter in the left internal carotid artery. With the micro guidewire leading with a J-tip configuration, the combination was then advanced to the left middle cerebral artery M1 segment. The micro guidewire was then advanced into the aneurysm followed by the SL 10 45 degrees angle micro catheter. The micro guidewire was advanced within the aneurysm under constant fluoroscopic guidance gingerly such that the tip of the wire was now pointing into the origin of the inferior division of the left middle cerebral artery. This was then followed by the micro  catheter. The micro guidewire was then gently manipulated with access obtained into the inferior division. The wire was then advanced distally into the inferior division followed by the SL 10 micro catheter carefully. There was free advancement of the micro catheter to the M3 region of the left middle cerebral artery inferior division. The micro guidewire was then removed. Good aspiration was obtained from the hub of the SL 10 micro catheter. A gentle contrast injection demonstrated antegrade flow distally. This was then connected to continuous heparinized saline infusion.  At this time a 2.5 mm x 34 mm LVIS Jr stent with a working length of 30 mm was then advanced in a coaxial manner and with constant heparinized saline infusion using biplane roadmap technique and constant fluoroscopic guidance to the distal end of the SL 10 micro catheter. The O ring on the delivery micro guidewire and the  delivery micro catheter were then gently loosened. With slight forward gentle traction with the right hand on the delivery micro guidewire, with the left hand the SL 10 micro catheter was gently retrieved in a controlled gradual slow fashion unsheathing the LVIS stent.  This was continued until the entire stent was deployed. A control arteriogram performed through the 6 [place] Navien guide catheter demonstrated excellent apposition in the left middle cerebral artery M1 segment and the M2 region of the inferior division of the left middle cerebral artery. However, there was significant narrowing noted in the stent just distal to its entry into the inferior division.  This was then subsequently dilated with a 0.012 inch J-tipped Headliner micro guidewire which was advanced to the focal narrowing within the stent followed by the micro catheter. The micro guidewire was then gingerly advanced to and fro at the site of the superior stenosis. The wire was then retrieved proximally as was the micro catheter. A control arteriogram  performed through the 6 [place] Navien guide catheter demonstrated significantly improved caliber of the stent at this point with excellent flow into the vessels.  Control arteriograms were then performed at 15, 30 and 40 minutes post stent deployment. These continued to demonstrate excellent flow. There was suspicion of a small filling defect along the distal aspect of the LVIS stent. This prompted the use of approximately 4 mg of superselective intracranial Integrelin over about 4 minutes.  Control arteriograms continued to demonstrate excellent flow with opening of the LVIS stent within the aneurysm itself.  A final control arteriogram performed through the 6 [place] Navien guide catheter continued to demonstrate excellent flow without any intraluminal filling defects.  This was then retrieved into the abdominal aorta along with the [place] shuttle sheath. This was then exchanged out for a 6 [place] Pinnacle sheath over a 0.035 inch guidewire. The sheath itself was then removed with the successful application of an external closure device.  The patient's ACT throughout the procedure was maintained in the region of 180 seconds.  No acute neurological or hemodynamic changes were seen throughout the procedure.  The patient's general anesthesia was then reversed and the patient was extubated. Upon recovery the patient demonstrated no new neurological signs or symptoms. Memory and orientation remained stable as did her motor, sensory and coordination function. She was then transferred to the neuro ICU to continue her IV heparin and to control her blood pressure on IV Cardene within the parameters.  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   Electronically Signed   By: Luanne Bras M.D.   On: 01/21/2015 13:30   Ir Angio Intra Extracran Sel Internal  Carotid Uni L Mod Sed  01/22/2015   CLINICAL DATA:  Progressive headaches. History of multiple intracranial aneurysms. 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  EXAM: TRANSCATHETER THERAPY EMBOLIZATION OF LEFT MIDDLE CEREBRAL ARTERY REGION ANEURYSM  ANESTHESIA/SEDATION: General anesthesia.  MEDICATIONS: As per general anesthesia.  CONTRAST:  180mL OMNIPAQUE IOHEXOL 300 MG/ML  SOLN  PROCEDURE: Following a full explanation of the procedure along with the potential associated complications, an informed witnessed consent was obtained.  The right groin was prepped and draped in the usual sterile fashion. Thereafter using modified Seldinger technique, transfemoral access into right  common femoral artery was obtained without difficulty. Over a 0.035 inch guidewire, a 5 French Pinnacle sheath was inserted. Through this, and also over a 0.035 inch guidewire, 5 French JB1 catheter was advanced to the aortic arch region and selectively positioned in the right common carotid artery, the right vertebral artery, and the left common carotid artery. The patient tolerated the procedure well. A 3D rotational arteriogram was also performed of the left intracranial circulation from a left-sided common carotid artery injection. Reconstructions were performed on a separate workstation.  COMPLICATIONS: None immediate.  FINDINGS: The right common carotid arteriogram demonstrates the right external carotid artery and its major branches to be widely patent.  The right internal carotid artery at the bulb and its proximal one-third is patent.  There is a modest kink at the junction of the middle and the one-third of the right internal carotid artery without impedance of any blood flow distally.  More distally the vessel is seen to opacify normally to the cranial  skull base.  The petrous, cavernous and supraclinoid segments are widely patent.  The right middle and the right anterior cerebral arteries are seen to opacify normally into the capillary and venous phases.  The right pericallosal artery is seen to cross the midline and supply the distal anterior cerebral artery distribution on the left.  Also seen are the previously positioned clips in the right middle cerebral artery distribution bifurcation. There is a mild fusiform prominence noted proximal to the clips at the inferior division of the right middle cerebral artery.  However, no angiographic change is noted from the previous catheter angiogram.  The vertebral artery origin is normal.  This is a dominant vertebral which it is seen to opacify to the cranial skull base. The previously endovascularly coiled right posterior-inferior cerebellar artery remains completely obliterated without evidence of recanalization or of coil compaction.  The right posterior-inferior cerebellar artery remains widely patent.  The distal basilar artery, the posterior cerebral arteries, the superior cerebellar arteries and the anterior inferior cerebellar arteries opacify normally into the capillary and venous phases.  The left common carotid arteriogram demonstrates the left external carotid artery and its major branches to be normal.  The left internal carotid artery at the bulb to the cranial skull base opacifies normally.  The petrous, the cavernous and the supraclinoid segments are widely patent. The left middle cerebral artery and the left anterior cerebral artery are seen to opacify normally into the capillary and venous phases.  Arising in the region of the superior and the inferior divisions of the left middle cerebral artery again seen is a saccular outpouching projecting inferiorly and slightly anteriorly.  A 3D rotational arteriogram performed with reconstruction on a separate workstation confirms the presence of a wide neck  lobulated aneurysm measuring approximately 5 mm x 4.5 mm. This is a wide neck which extends mostly into the origin of the inferior division.  ENDOVASCULAR STAGED EMBOLIZATION OF WIDE NECK LOBULATED LEFT MCA BIFURCATION ANEURYSM USING THE LVIS JR STENT.  The angiographic findings were reviewed with the patient and the patient's family. Informed consent was obtained. The patient was then put under general anesthesia by the Department of Anesthesiology at [place] catheter in the left common carotid artery was then exchanged over a 0.035 inch 300 cm Constance Holster exchange guidewire for a 6 French 80 cm [place] shuttle sheath using biplane roadmap technique and constant fluoroscopic guidance. Good aspiration was obtained from the side port of the Tuohy-Borst at  the hub of the [place] shuttle sheath. A gentle contrast injection demonstrated no evidence of spasms, dissections or intraluminal filling defects at the distal end of the guide catheter in the left common carotid artery. This was then connected to continuous heparinized saline infusion.  A 6 French 115 cm Navien guide catheter was then advanced just distal to the tip of the [place] shuttle sheath in the distal left common carotid artery. The guidewire was removed. Good aspiration was then obtained from the hub of the 6 [place] Navien guide catheter. A gentle contrast injection revealed no evidence of spasms, dissections or of intraluminal filling defects.  Over a 0.035 inch Roadrunner guidewire, using biplane roadmap technique, the 6 [place] Navien guide catheter was then advanced to the distal cervical left ICA and positioned at the cervical petrous junction. The guidewire was removed. Good aspiration was obtained from the hub of the 6 [place] Navien guide catheter.  At this time in a coaxial manner and with constant heparinized saline infusion using biplane roadmap technique and constant fluoroscopic guidance, over a 0.014 inch Softip  Synchro micro guidewire, a Headway 17 2 tip micro catheter which had been steam-shaped was then advanced to the distal end of the 6 [place] Navien guide catheter. The micro guidewire was then gently manipulated using a torque device into the left middle cerebral artery M1 segment followed by the micro catheter. The micro guidewire was then advanced and positioned just proximal to the level of the aneurysm neck at the origin of the inferior division of the left middle cerebral artery. Multiple attempts were then made to advance the micro guidewire with different distal configurations into the inferior divisions. All of these were unsuccessful.  The micro catheter was then advanced to the M2 M3 region of the superior division of the left middle cerebral artery. The guidewire was removed. Good aspiration was obtained from the hub of the Headway micro catheter. This was then connected to continuous heparinized saline infusion.  Through the second port of the Tuohy-Borst at the hub of the 6 [place] Navien guide catheter, an SL 10 2-tip micro catheter with a 45 degree angulation was then advanced over 0.0148 inch Softip Synchro micro guidewire to the distal end of the 6 [place] Navien guide catheter in the left internal carotid artery. With the micro guidewire leading with a J-tip configuration, the combination was then advanced to the left middle cerebral artery M1 segment. The micro guidewire was then advanced into the aneurysm followed by the SL 10 45 degrees angle micro catheter. The micro guidewire was advanced within the aneurysm under constant fluoroscopic guidance gingerly such that the tip of the wire was now pointing into the origin of the inferior division of the left middle cerebral artery. This was then followed by the micro catheter. The micro guidewire was then gently manipulated with access obtained into the inferior division. The wire was then advanced distally into the inferior division followed by the SL  10 micro catheter carefully. There was free advancement of the micro catheter to the M3 region of the left middle cerebral artery inferior division. The micro guidewire was then removed. Good aspiration was obtained from the hub of the SL 10 micro catheter. A gentle contrast injection demonstrated antegrade flow distally. This was then connected to continuous heparinized saline infusion.  At this time a 2.5 mm x 34 mm LVIS Jr stent with a working length of 30 mm was then advanced in a coaxial manner and with constant  heparinized saline infusion using biplane roadmap technique and constant fluoroscopic guidance to the distal end of the SL 10 micro catheter. The O ring on the delivery micro guidewire and the delivery micro catheter were then gently loosened. With slight forward gentle traction with the right hand on the delivery micro guidewire, with the left hand the SL 10 micro catheter was gently retrieved in a controlled gradual slow fashion unsheathing the LVIS stent.  This was continued until the entire stent was deployed. A control arteriogram performed through the 6 [place] Navien guide catheter demonstrated excellent apposition in the left middle cerebral artery M1 segment and the M2 region of the inferior division of the left middle cerebral artery. However, there was significant narrowing noted in the stent just distal to its entry into the inferior division.  This was then subsequently dilated with a 0.012 inch J-tipped Headliner micro guidewire which was advanced to the focal narrowing within the stent followed by the micro catheter. The micro guidewire was then gingerly advanced to and fro at the site of the superior stenosis. The wire was then retrieved proximally as was the micro catheter. A control arteriogram performed through the 6 [place] Navien guide catheter demonstrated significantly improved caliber of the stent at this point with excellent flow into the vessels.  Control arteriograms were then  performed at 15, 30 and 40 minutes post stent deployment. These continued to demonstrate excellent flow. There was suspicion of a small filling defect along the distal aspect of the LVIS stent. This prompted the use of approximately 4 mg of superselective intracranial Integrelin over about 4 minutes.  Control arteriograms continued to demonstrate excellent flow with opening of the LVIS stent within the aneurysm itself.  A final control arteriogram performed through the 6 [place] Navien guide catheter continued to demonstrate excellent flow without any intraluminal filling defects.  This was then retrieved into the abdominal aorta along with the [place] shuttle sheath. This was then exchanged out for a 6 [place] Pinnacle sheath over a 0.035 inch guidewire. The sheath itself was then removed with the successful application of an external closure device.  The patient's ACT throughout the procedure was maintained in the region of 180 seconds.  No acute neurological or hemodynamic changes were seen throughout the procedure.  The patient's general anesthesia was then reversed and the patient was extubated. Upon recovery the patient demonstrated no new neurological signs or symptoms. Memory and orientation remained stable as did her motor, sensory and coordination function. She was then transferred to the neuro ICU to continue her IV heparin and to control her blood pressure on IV Cardene within the parameters.  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   Electronically Signed   By: Luanne Bras M.D.   On: 01/21/2015 13:30   Ir Angio Vertebral Sel Vertebral Uni R Mod Sed  01/22/2015   CLINICAL DATA:  Progressive headaches. History of multiple intracranial aneurysms. 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  EXAM: TRANSCATHETER THERAPY EMBOLIZATION OF LEFT MIDDLE CEREBRAL ARTERY REGION ANEURYSM  ANESTHESIA/SEDATION: General anesthesia.  MEDICATIONS: As per general anesthesia.  CONTRAST:  116mL OMNIPAQUE IOHEXOL 300 MG/ML  SOLN  PROCEDURE: Following a full explanation of the procedure along with the potential associated  complications, an informed witnessed consent was obtained.  The right groin was prepped and draped in the usual sterile fashion. Thereafter using modified Seldinger technique, transfemoral access into right common femoral artery was obtained without difficulty. Over a 0.035 inch guidewire, a 5 French Pinnacle sheath was inserted. Through this, and also over a 0.035 inch guidewire, 5 French JB1 catheter was advanced to the aortic arch region and selectively positioned in the right common carotid artery, the right vertebral artery, and the left common carotid artery. The patient tolerated the procedure well. A 3D rotational arteriogram was also performed of the left intracranial circulation from a left-sided common carotid artery injection. Reconstructions were performed on a separate workstation.  COMPLICATIONS: None immediate.  FINDINGS: The right common carotid arteriogram demonstrates the right external carotid artery and its major branches to be widely patent.  The right internal carotid artery at the bulb and its proximal one-third is patent.  There is a modest kink at the junction of the middle and the one-third of the right internal carotid artery without impedance of any blood flow distally.  More distally the vessel is seen to opacify normally to the cranial skull base.  The petrous, cavernous and supraclinoid segments are widely patent.  The right middle and the right anterior cerebral arteries are seen to opacify normally into the capillary and venous phases.  The  right pericallosal artery is seen to cross the midline and supply the distal anterior cerebral artery distribution on the left.  Also seen are the previously positioned clips in the right middle cerebral artery distribution bifurcation. There is a mild fusiform prominence noted proximal to the clips at the inferior division of the right middle cerebral artery.  However, no angiographic change is noted from the previous catheter angiogram.  The vertebral artery origin is normal.  This is a dominant vertebral which it is seen to opacify to the cranial skull base. The previously endovascularly coiled right posterior-inferior cerebellar artery remains completely obliterated without evidence of recanalization or of coil compaction.  The right posterior-inferior cerebellar artery remains widely patent.  The distal basilar artery, the posterior cerebral arteries, the superior cerebellar arteries and the anterior inferior cerebellar arteries opacify normally into the capillary and venous phases.  The left common carotid arteriogram demonstrates the left external carotid artery and its major branches to be normal.  The left internal carotid artery at the bulb to the cranial skull base opacifies normally.  The petrous, the cavernous and the supraclinoid segments are widely patent. The left middle cerebral artery and the left anterior cerebral artery are seen to opacify normally into the capillary and venous phases.  Arising in the region of the superior and the inferior divisions of the left middle cerebral artery again seen is a saccular outpouching projecting inferiorly and slightly anteriorly.  A 3D rotational arteriogram performed with reconstruction on a separate workstation confirms the presence of a wide neck lobulated aneurysm measuring approximately 5 mm x 4.5 mm. This is a wide neck which extends mostly into the origin of the inferior division.  ENDOVASCULAR STAGED EMBOLIZATION OF WIDE NECK LOBULATED LEFT MCA  BIFURCATION ANEURYSM USING THE LVIS JR STENT.  The angiographic findings were reviewed with the patient and the patient's family. Informed consent was obtained. The patient was then put under general anesthesia by the Department of Anesthesiology at [place] catheter in the left common carotid artery was then exchanged over a 0.035 inch 300 cm Constance Holster exchange guidewire  for a 6 French 80 cm [place] shuttle sheath using biplane roadmap technique and constant fluoroscopic guidance. Good aspiration was obtained from the side port of the Tuohy-Borst at the hub of the [place] shuttle sheath. A gentle contrast injection demonstrated no evidence of spasms, dissections or intraluminal filling defects at the distal end of the guide catheter in the left common carotid artery. This was then connected to continuous heparinized saline infusion.  A 6 French 115 cm Navien guide catheter was then advanced just distal to the tip of the [place] shuttle sheath in the distal left common carotid artery. The guidewire was removed. Good aspiration was then obtained from the hub of the 6 [place] Navien guide catheter. A gentle contrast injection revealed no evidence of spasms, dissections or of intraluminal filling defects.  Over a 0.035 inch Roadrunner guidewire, using biplane roadmap technique, the 6 [place] Navien guide catheter was then advanced to the distal cervical left ICA and positioned at the cervical petrous junction. The guidewire was removed. Good aspiration was obtained from the hub of the 6 [place] Navien guide catheter.  At this time in a coaxial manner and with constant heparinized saline infusion using biplane roadmap technique and constant fluoroscopic guidance, over a 0.014 inch Softip Synchro micro guidewire, a Headway 17 2 tip micro catheter which had been steam-shaped was then advanced to the distal end of the 6 [place] Navien guide catheter. The micro guidewire was then gently manipulated  using a torque device into the left middle cerebral artery M1 segment followed by the micro catheter. The micro guidewire was then advanced and positioned just proximal to the level of the aneurysm neck at the origin of the inferior division of the left middle cerebral artery. Multiple attempts were then made to advance the micro guidewire with different distal configurations into the inferior divisions. All of these were unsuccessful.  The micro catheter was then advanced to the M2 M3 region of the superior division of the left middle cerebral artery. The guidewire was removed. Good aspiration was obtained from the hub of the Headway micro catheter. This was then connected to continuous heparinized saline infusion.  Through the second port of the Tuohy-Borst at the hub of the 6 [place] Navien guide catheter, an SL 10 2-tip micro catheter with a 45 degree angulation was then advanced over 0.0148 inch Softip Synchro micro guidewire to the distal end of the 6 [place] Navien guide catheter in the left internal carotid artery. With the micro guidewire leading with a J-tip configuration, the combination was then advanced to the left middle cerebral artery M1 segment. The micro guidewire was then advanced into the aneurysm followed by the SL 10 45 degrees angle micro catheter. The micro guidewire was advanced within the aneurysm under constant fluoroscopic guidance gingerly such that the tip of the wire was now pointing into the origin of the inferior division of the left middle cerebral artery. This was then followed by the micro catheter. The micro guidewire was then gently manipulated with access obtained into the inferior division. The wire was then advanced distally into the inferior division followed by the SL 10 micro catheter carefully. There was free advancement of the micro catheter to the M3 region of the left middle cerebral artery inferior division. The micro guidewire was then removed. Good aspiration was  obtained from the hub of the SL 10 micro catheter. A gentle contrast injection demonstrated antegrade flow distally. This was then connected to continuous heparinized saline infusion.  At this time a 2.5 mm x 34 mm LVIS Jr stent with a working length of 30 mm was then advanced in a coaxial manner and with constant heparinized saline infusion using biplane roadmap technique and constant fluoroscopic guidance to the distal end of the SL 10 micro catheter. The O ring on the delivery micro guidewire and the delivery micro catheter were then gently loosened. With slight forward gentle traction with the right hand on the delivery micro guidewire, with the left hand the SL 10 micro catheter was gently retrieved in a controlled gradual slow fashion unsheathing the LVIS stent.  This was continued until the entire stent was deployed. A control arteriogram performed through the 6 [place] Navien guide catheter demonstrated excellent apposition in the left middle cerebral artery M1 segment and the M2 region of the inferior division of the left middle cerebral artery. However, there was significant narrowing noted in the stent just distal to its entry into the inferior division.  This was then subsequently dilated with a 0.012 inch J-tipped Headliner micro guidewire which was advanced to the focal narrowing within the stent followed by the micro catheter. The micro guidewire was then gingerly advanced to and fro at the site of the superior stenosis. The wire was then retrieved proximally as was the micro catheter. A control arteriogram performed through the 6 [place] Navien guide catheter demonstrated significantly improved caliber of the stent at this point with excellent flow into the vessels.  Control arteriograms were then performed at 15, 30 and 40 minutes post stent deployment. These continued to demonstrate excellent flow. There was suspicion of a small filling defect along the distal aspect of the LVIS stent. This prompted  the use of approximately 4 mg of superselective intracranial Integrelin over about 4 minutes.  Control arteriograms continued to demonstrate excellent flow with opening of the LVIS stent within the aneurysm itself.  A final control arteriogram performed through the 6 [place] Navien guide catheter continued to demonstrate excellent flow without any intraluminal filling defects.  This was then retrieved into the abdominal aorta along with the [place] shuttle sheath. This was then exchanged out for a 6 [place] Pinnacle sheath over a 0.035 inch guidewire. The sheath itself was then removed with the successful application of an external closure device.  The patient's ACT throughout the procedure was maintained in the region of 180 seconds.  No acute neurological or hemodynamic changes were seen throughout the procedure.  The patient's general anesthesia was then reversed and the patient was extubated. Upon recovery the patient demonstrated no new neurological signs or symptoms. Memory and orientation remained stable as did her motor, sensory and coordination function. She was then transferred to the neuro ICU to continue her IV heparin and to control her blood pressure on IV Cardene within the parameters.  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   Electronically Signed   By: Luanne Bras M.D.   On: 01/21/2015 13:30   Ir Neuro Each Add'l After Basic Uni Left (ms)  01/22/2015   CLINICAL DATA:  Progressive headaches. History of multiple intracranial aneurysms. 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  EXAM: TRANSCATHETER THERAPY EMBOLIZATION OF LEFT MIDDLE CEREBRAL ARTERY REGION  ANEURYSM  ANESTHESIA/SEDATION: General anesthesia.  MEDICATIONS: As per general anesthesia.  CONTRAST:  135mL OMNIPAQUE IOHEXOL 300 MG/ML  SOLN  PROCEDURE: Following a full explanation of the procedure along with the potential associated complications, an informed witnessed consent was obtained.  The right groin was prepped and draped in the usual sterile fashion. Thereafter using modified Seldinger technique, transfemoral access into right common femoral artery was obtained without difficulty. Over a 0.035 inch guidewire, a 5 French Pinnacle sheath was inserted. Through this, and also over a 0.035 inch guidewire, 5 French JB1 catheter was advanced to the aortic arch region and selectively positioned in the right common carotid artery, the right vertebral artery, and the left common carotid artery. The patient tolerated the procedure well. A 3D rotational arteriogram was also performed of the left intracranial circulation from a left-sided common carotid artery injection. Reconstructions were performed on a separate workstation.  COMPLICATIONS: None immediate.  FINDINGS: The right common carotid arteriogram demonstrates the right external carotid artery and its major branches to be widely patent.  The right internal carotid artery at the bulb and its proximal one-third is patent.  There is a modest kink at the junction of the middle and the one-third of the right internal carotid artery without impedance of any blood flow distally.  More distally the vessel is seen to opacify normally to the cranial skull base.  The petrous, cavernous and supraclinoid segments are widely patent.  The right middle and the right anterior cerebral arteries are seen to opacify normally into the capillary and venous phases.  The right pericallosal artery is seen to cross the midline and supply the distal anterior cerebral artery distribution on the left.  Also seen are the previously positioned clips in the right middle cerebral artery  distribution bifurcation. There is a mild fusiform prominence noted proximal to the clips at the inferior division of the right middle cerebral artery.  However, no angiographic change is noted from the previous catheter angiogram.  The vertebral artery origin is normal.  This is a dominant vertebral which it is seen to opacify to the cranial skull base. The previously endovascularly coiled right posterior-inferior cerebellar artery remains completely obliterated without evidence of recanalization or of coil compaction.  The right posterior-inferior cerebellar artery remains widely patent.  The distal basilar artery, the posterior cerebral arteries, the superior cerebellar arteries and the anterior inferior cerebellar arteries opacify normally into the capillary and venous phases.  The left common carotid arteriogram demonstrates the left external carotid artery and its major branches to be normal.  The left internal carotid artery at the bulb to the cranial skull base opacifies normally.  The petrous, the cavernous and the supraclinoid segments are widely patent. The left middle cerebral artery and the left anterior cerebral artery are seen to opacify normally into the capillary and venous phases.  Arising in the region of the superior and the inferior divisions of the left middle cerebral artery again seen is a saccular outpouching projecting inferiorly and slightly anteriorly.  A 3D rotational arteriogram performed with reconstruction on a separate workstation confirms the presence of a wide neck lobulated aneurysm measuring approximately 5 mm x 4.5 mm. This is a wide neck which extends mostly into the origin of the inferior division.  ENDOVASCULAR STAGED EMBOLIZATION OF WIDE NECK LOBULATED LEFT MCA BIFURCATION ANEURYSM USING THE LVIS JR STENT.  The angiographic findings were reviewed with the patient and the patient's family. Informed consent was obtained. The patient was then put under general anesthesia by the  Department  of Anesthesiology at [place] catheter in the left common carotid artery was then exchanged over a 0.035 inch 300 cm Constance Holster exchange guidewire for a 6 French 80 cm Cook shuttle sheath using biplane roadmap technique and constant fluoroscopic guidance. Good aspiration was obtained from the side port of the Tuohy-Borst at the hub of the [place] shuttle sheath. A gentle contrast injection demonstrated no evidence of spasms, dissections or intraluminal filling defects at the distal end of the guide catheter in the left common carotid artery. This was then connected to continuous heparinized saline infusion.  A 6 French 115 cm Navien guide catheter was then advanced just distal to the tip of the [place] shuttle sheath in the distal left common carotid artery. The guidewire was removed. Good aspiration was then obtained from the hub of the 6 [place] Navien guide catheter. A gentle contrast injection revealed no evidence of spasms, dissections or of intraluminal filling defects.  Over a 0.035 inch Roadrunner guidewire, using biplane roadmap technique, the 6 [place] Navien guide catheter was then advanced to the distal cervical left ICA and positioned at the cervical petrous junction. The guidewire was removed. Good aspiration was obtained from the hub of the 6 [place] Navien guide catheter.  At this time in a coaxial manner and with constant heparinized saline infusion using biplane roadmap technique and constant fluoroscopic guidance, over a 0.014 inch Softip Synchro micro guidewire, a Headway 17 2 tip micro catheter which had been steam-shaped was then advanced to the distal end of the 6 [place] Navien guide catheter. The micro guidewire was then gently manipulated using a torque device into the left middle cerebral artery M1 segment followed by the micro catheter. The micro guidewire was then advanced and positioned just proximal to the level of the aneurysm neck at the origin of  the inferior division of the left middle cerebral artery. Multiple attempts were then made to advance the micro guidewire with different distal configurations into the inferior divisions. All of these were unsuccessful.  The micro catheter was then advanced to the M2 M3 region of the superior division of the left middle cerebral artery. The guidewire was removed. Good aspiration was obtained from the hub of the Headway micro catheter. This was then connected to continuous heparinized saline infusion.  Through the second port of the Tuohy-Borst at the hub of the 6 [place] Navien guide catheter, an SL 10 2-tip micro catheter with a 45 degree angulation was then advanced over 0.0148 inch Softip Synchro micro guidewire to the distal end of the 6 [place] Navien guide catheter in the left internal carotid artery. With the micro guidewire leading with a J-tip configuration, the combination was then advanced to the left middle cerebral artery M1 segment. The micro guidewire was then advanced into the aneurysm followed by the SL 10 45 degrees angle micro catheter. The micro guidewire was advanced within the aneurysm under constant fluoroscopic guidance gingerly such that the tip of the wire was now pointing into the origin of the inferior division of the left middle cerebral artery. This was then followed by the micro catheter. The micro guidewire was then gently manipulated with access obtained into the inferior division. The wire was then advanced distally into the inferior division followed by the SL 10 micro catheter carefully. There was free advancement of the micro catheter to the M3 region of the left middle cerebral artery inferior division. The micro guidewire was then removed. Good aspiration was  obtained from the hub of the SL 10 micro catheter. A gentle contrast injection demonstrated antegrade flow distally. This was then connected to continuous heparinized saline infusion.  At this time a 2.5 mm x 34 mm LVIS Jr  stent with a working length of 30 mm was then advanced in a coaxial manner and with constant heparinized saline infusion using biplane roadmap technique and constant fluoroscopic guidance to the distal end of the SL 10 micro catheter. The O ring on the delivery micro guidewire and the delivery micro catheter were then gently loosened. With slight forward gentle traction with the right hand on the delivery micro guidewire, with the left hand the SL 10 micro catheter was gently retrieved in a controlled gradual slow fashion unsheathing the LVIS stent.  This was continued until the entire stent was deployed. A control arteriogram performed through the 6 [place] Navien guide catheter demonstrated excellent apposition in the left middle cerebral artery M1 segment and the M2 region of the inferior division of the left middle cerebral artery. However, there was significant narrowing noted in the stent just distal to its entry into the inferior division.  This was then subsequently dilated with a 0.012 inch J-tipped Headliner micro guidewire which was advanced to the focal narrowing within the stent followed by the micro catheter. The micro guidewire was then gingerly advanced to and fro at the site of the superior stenosis. The wire was then retrieved proximally as was the micro catheter. A control arteriogram performed through the 6 [place] Navien guide catheter demonstrated significantly improved caliber of the stent at this point with excellent flow into the vessels.  Control arteriograms were then performed at 15, 30 and 40 minutes post stent deployment. These continued to demonstrate excellent flow. There was suspicion of a small filling defect along the distal aspect of the LVIS stent. This prompted the use of approximately 4 mg of superselective intracranial Integrelin over about 4 minutes.  Control arteriograms continued to demonstrate excellent flow with opening of the LVIS stent within the aneurysm itself.  A final  control arteriogram performed through the 6 [place] Navien guide catheter continued to demonstrate excellent flow without any intraluminal filling defects.  This was then retrieved into the abdominal aorta along with the [place] shuttle sheath. This was then exchanged out for a 6 [place] Pinnacle sheath over a 0.035 inch guidewire. The sheath itself was then removed with the successful application of an external closure device.  The patient's ACT throughout the procedure was maintained in the region of 180 seconds.  No acute neurological or hemodynamic changes were seen throughout the procedure.  The patient's general anesthesia was then reversed and the patient was extubated. Upon recovery the patient demonstrated no new neurological signs or symptoms. Memory and orientation remained stable as did her motor, sensory and coordination function. She was then transferred to the neuro ICU to continue her IV heparin and to control her blood pressure on IV Cardene within the parameters.  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   Electronically Signed   By: Luanne Bras M.D.   On: 01/21/2015 13:30    Labs:  CBC:  Recent Labs  01/18/15 1308 01/21/15 0505 01/21/15 1145 01/22/15 1821  WBC 9.1 12.1* 12.3* 8.8  HGB 15.0 11.9* 12.7 13.6  HCT 43.2 34.8* 37.3 39.6  PLT 217 187 189 182  COAGS:  Recent Labs  01/18/15 1308  INR 0.95  APTT 31    BMP:  Recent Labs  11/24/14 1450 01/18/15 1308 01/21/15 0505 01/21/15 1145 01/22/15 1821  NA 143 140 141  --   --   K 4.0 3.8 3.8  --   --   CL 103 105 108  --   --   CO2 25 26 29   --   --   GLUCOSE 146* 181* 105*  --   --   BUN 18 12 7   --   --   CALCIUM 9.8 8.6 7.9*  --   --   CREATININE 0.92 1.05 0.69 0.84 0.78  GFRNONAA 64 53* 87* 70* 84*  GFRAA 74 62* >90 81* >90    LIVER  FUNCTION TESTS:  Recent Labs  01/18/15 1308  BILITOT 0.7  AST 19  ALT 23  ALKPHOS 94  PROT 6.4  ALBUMIN 3.9    Assessment and Plan: S/p left wide necked MCA aneurysm stent assisted embolization 3/9 with subsequent left sided weakness, findings of right thalamic ischemic changes on CT; now transferred to rehab for further eval/tx; cont ASA/plavix; f/u with Dr. Estanislado Pandy in [facility] clinic in 2 weeks   Signed: Autumn Messing 01/23/2015, 2:14 PM   I spent a total of 15 minutes in face to face in clinical consultation/evaluation, greater than 50% of which was counseling/coordinating care for left MCA aneurysm stenting

## 2015-01-24 ENCOUNTER — Encounter (HOSPITAL_COMMUNITY): Payer: Medicare Other | Admitting: Occupational Therapy

## 2015-01-24 NOTE — Progress Notes (Signed)
Occupational Therapy Session Note  Patient Details  Name: Diane Hutchinson MRN: [id number] Date of Birth: [birth year]  Today's Date: 01/24/2015 OT Individual Time: 1000-1100 OT Individual Time Calculation (min): 60 min    Short Term Goals: Week 1:  OT Short Term Goal 1 (Week 1): Pt will complete squat-pivot transfer to drop arm BSC with min assist OT Short Term Goal 2 (Week 1): Pt will complete upper body dressing with supervision OT Short Term Goal 3 (Week 1): Pt will complete lower body dressing with mod assist OT Short Term Goal 4 (Week 1): Pt will complete 1 of 5 grooming tasks standing supported at sink with min guard assist OT Short Term Goal 5 (Week 1): Pt will demo ability to wash left lower leg with left hand using wash mit with supervision  Skilled Therapeutic Interventions/Progress Updates:    Pt was in the restroom to begin session.  Therapist assisted with sit to stand at max assist in order for tech to help with wiping peri area after bowel movement.  Noted pt with decreased left knee and hip control in standing as well as increased trunk flexion.  Max assist for stand pivot transfer back to the wheelchair.  Once in the chair pt performed bathing and dressing at the sink.  She declined participating in shower since she took one yesterday.  Min assist for bilateral scooting to the EOC to enhance trunk alignment while working.  Mod assist needed to incorporate the LUE into squeezing out the washcloth and for washing 1/2 of her UB.  Therapist provided wash mit to try as well.  Overall she demonstrates decreased proprioception so needs constant visual input to use the LUE into the task.  Mod demonstrational cueing for hemi techniques with crossing the LLE over the right knee for bathing and dressing.  Max assist for dressing as pt with decreased ability to thread the LUE through the arm sleeve and maintain it, while attempting the right and pulling overhead.  Mod assist for sit to  stand with pt demonstration increased inversion in the LLE with external rotation of the femur.    Therapy Documentation Precautions:  Precautions Precautions: Fall Precaution Comments: L inattention Restrictions Weight Bearing Restrictions: No  Pain: Pain Assessment Pain Assessment: No/denies pain ADL: See FIM for current functional status  Therapy/Group: Individual Therapy  Khalis Hittle OTR/L 01/24/2015, 12:44 PM

## 2015-01-24 NOTE — Progress Notes (Signed)
RN assisted patient to the bathroom, patient claimed that she hit her left hand while toileting. Bruising to her left knuckle noted, cold compress applied. Tylenol 1000 mg prn given.

## 2015-01-24 NOTE — Progress Notes (Signed)
Patient ID: Diane Hutchinson, female   DOB: 03/15/1946, 69 y.o.   MRN: [id number]   Patient ID: Diane Hutchinson, female   DOB: 11/10/1946, 69 y.o.   MRN: [id number]   01/24/15.  69 year old patient who was admitted  01/23/15  for CIR following a right brain embolic stroke.  Complaining of insomnia and anxiety through the night.  Somewhat upset this morning that she did not receive her anxiolytic medications promptly.  No other concerns or complaints today.  Past Medical History  Diagnosis Date  . Migraine   . Depression   . Hypertension   . Stroke   . COPD (chronic obstructive pulmonary disease)   . Anxiety     h/o of panic attack  . GERD (gastroesophageal reflux disease)     occas. use of TUMS  . Arthritis     knees    Patient Vitals for the past 24 hrs:  BP Temp Temp src Pulse Resp SpO2  01/24/15 0523 (!) 160/69 mmHg 97.9 F (36.6 C) Oral 84 18 92 %  01/23/15 1354 (!) 145/70 mmHg 98.7 F (37.1 C) Oral 79 17 96 %     Intake/Output Summary (Last 24 hours) at 01/24/15 0753 Last data filed at 01/23/15 1700  Gross per 24 hour  Intake    600 ml  Output      0 ml  Net    600 ml    Gen- alert and appropriate Heent- neg Neck- no adenopathy or neck vein distention Chest-clear to auscultation Cardiovascular-regular rate and rhythm, no murmur Abdomen soft and nontender.  No distention Extremities no edema Neuro-alert and oriented  with normal speech; left hemiparesis, arm affected greater than leg   Medical Problem List and Plan: 1. 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 DVT Prophylaxis/Anticoagulation: SQ Lovenox.monitor for any bleeding episodes 3. Pain Management: tylenol as needed 4. Mood/depression/anxiety: Risperdal 0.5 mg QHS/Desyrel 150 mg QHS,Zoloft 100 mg daily,Xanax 1 mg TID as needed. Team to provide ego-support as well. 5. Neuropsych: This patient is capable of making decisions  on her own behalf. 6. Skin/Wound Care: Routine skin checks 7. Fluids/Electrolytes/Nutrition: Strict I & O.Follow up labs 8.Hyperlipidemia.Lipitor/Lovaza

## 2015-01-25 ENCOUNTER — Inpatient Hospital Stay (HOSPITAL_COMMUNITY): Payer: Medicare Other | Admitting: Speech Pathology

## 2015-01-25 ENCOUNTER — Inpatient Hospital Stay (HOSPITAL_COMMUNITY): Payer: Medicare Other | Admitting: Rehabilitation

## 2015-01-25 ENCOUNTER — Inpatient Hospital Stay (HOSPITAL_COMMUNITY): Payer: Medicare Other

## 2015-01-25 DIAGNOSIS — R208 Other disturbances of skin sensation: Secondary | ICD-10-CM

## 2015-01-25 DIAGNOSIS — I69898 Other sequelae of other cerebrovascular disease: Secondary | ICD-10-CM

## 2015-01-25 LAB — CBC WITH DIFFERENTIAL/PLATELET
Basophils Absolute: 0 10*3/uL (ref 0.0–0.1)
Basophils Relative: 0 % (ref 0–1)
Eosinophils Absolute: 0.3 10*3/uL (ref 0.0–0.7)
Eosinophils Relative: 3 % (ref 0–5)
HCT: 39 % (ref 36.0–46.0)
HEMOGLOBIN: 13.5 g/dL (ref 12.0–15.0)
LYMPHS ABS: 2.4 10*3/uL (ref 0.7–4.0)
LYMPHS PCT: 31 % (ref 12–46)
MCH: 32.6 pg (ref 26.0–34.0)
MCHC: 34.6 g/dL (ref 30.0–36.0)
MCV: 94.2 fL (ref 78.0–100.0)
MONOS PCT: 9 % (ref 3–12)
Monocytes Absolute: 0.7 10*3/uL (ref 0.1–1.0)
NEUTROS ABS: 4.5 10*3/uL (ref 1.7–7.7)
Neutrophils Relative %: 57 % (ref 43–77)
PLATELETS: 218 10*3/uL (ref 150–400)
RBC: 4.14 MIL/uL (ref 3.87–5.11)
RDW: 12.7 % (ref 11.5–15.5)
WBC: 7.9 10*3/uL (ref 4.0–10.5)

## 2015-01-25 LAB — COMPREHENSIVE METABOLIC PANEL
ALBUMIN: 3.3 g/dL — AB (ref 3.5–5.2)
ALK PHOS: 54 U/L (ref 39–117)
ALT: 18 U/L (ref 0–35)
ANION GAP: 8 (ref 5–15)
AST: 19 U/L (ref 0–37)
BILIRUBIN TOTAL: 0.7 mg/dL (ref 0.3–1.2)
BUN: 8 mg/dL (ref 6–23)
CHLORIDE: 108 mmol/L (ref 96–112)
CO2: 27 mmol/L (ref 19–32)
Calcium: 9 mg/dL (ref 8.4–10.5)
Creatinine, Ser: 0.66 mg/dL (ref 0.50–1.10)
GFR calc Af Amer: >90 mL/min (ref >90)
GFR, EST NON AFRICAN AMERICAN: 89 mL/min — AB (ref >90)
Glucose, Bld: 136 mg/dL — ABNORMAL HIGH (ref 70–99)
POTASSIUM: 3.1 mmol/L — AB (ref 3.5–5.1)
Sodium: 143 mmol/L (ref 135–145)
Total Protein: 6.2 g/dL (ref 6.0–8.3)

## 2015-01-25 MED ORDER — POTASSIUM CHLORIDE CRYS ER 20 MEQ PO TBCR
20.0000 meq | EXTENDED_RELEASE_TABLET | Freq: Two times a day (BID) | ORAL | Status: AC
  Administered 2015-01-25 – 2015-01-26 (×4): 20 meq via ORAL
  Filled 2015-01-25 (×4): qty 1

## 2015-01-25 NOTE — IPOC Note (Addendum)
Overall Plan of Care [facility]) Patient Details Name: Diane Hutchinson MRN: [id number] DOB: May 07, 1946  Admitting Diagnosis: RT CVA  Hospital Problems: Active Problems:   Embolic cerebral infarction   Left hemiparesis     Functional Problem List: Nursing    PT Balance, Endurance, Safety, Motor  OT Balance, Cognition, Behavior, Endurance, Motor, Safety, Sensory  SLP    TR Activity tolerance, functional mobility, balance, cognition/safety, pain, anxiety/stress        Basic ADL's: OT Grooming, Bathing, Dressing, Toileting     Advanced  ADL's: OT       Transfers: PT Bed Mobility, Bed to Chair, Teacher, early years/pre, Tub/Shower     Locomotion: PT Ambulation, Emergency planning/management officer, Stairs     Additional Impairments: OT Fuctional Use of Upper Extremity  SLP None      TR      Anticipated Outcomes Item Anticipated Outcome  Self Feeding Mod I  Swallowing      Basic self-care  Min A  Toileting  Supervision   Bathroom Transfers Supervision  Bowel/Bladder     Transfers  S for transfers  Locomotion  S for gait, min A for stairs. mod I w/c mobility.  Communication     Cognition     Pain     Safety/Judgment      Therapy Plan: PT Intensity: Minimum of 1-2 x/day ,45 to 90 minutes PT Frequency: 5 out of 7 days PT Duration Estimated Length of Stay: 25 to 28 days OT Intensity: Minimum of 1-2 x/day, 45 to 90 minutes OT Duration/Estimated Length of Stay: 3-4 weeks SLP Duration/Estimated Length of Stay: N/A  TR Duration/ELOS:  3 weeks TR Frequency:  Min 1 time per week >20 minutes       Team Interventions: Nursing Interventions    PT interventions Ambulation/gait training, Training and development officer, Disease management/prevention, Discharge planning, DME/adaptive equipment instruction, Functional mobility training, Patient/family education, Neuromuscular re-education, Psychosocial support, Splinting/orthotics, Therapeutic Exercise, Visual/perceptual  remediation/compensation, UE/LE Coordination activities, Therapeutic Activities, Stair training, UE/LE Strength taining/ROM, Wheelchair propulsion/positioning  OT Interventions Training and development officer, Neuromuscular re-education, Patient/family education, Self Care/advanced ADL retraining, Therapeutic Exercise, UE/LE Coordination activities, UE/LE Strength taining/ROM, Therapeutic Activities, Functional mobility training, DME/adaptive equipment instruction, Discharge planning, Disease mangement/prevention, Cognitive remediation/compensation  SLP Interventions    TR Interventions Recreation/leisure participation, Balance/Vestibular training, functional mobility, therapeutic activities, UE/LE strength/coordination, w/c mobility, community reintegration, pt/family education, adaptive equipment instruction/use, discharge planning, psychosocial support  SW/CM Interventions      Team Discharge Planning: Destination: PT-Home ,OT- Home , SLP-Home Projected Follow-up: PT-Home health PT, OT-  Home health OT, SLP-None Projected Equipment Needs: PT-To be determined, OT- To be determined, SLP-None recommended by SLP Equipment Details: PT- , OT-  Patient/family involved in discharge planning: PT- Patient, Family member/caregiver,  OT-Patient, Family member/caregiver, SLP-Patient, Family member/caregiver  MD ELOS: 18-21d Medical Rehab Prognosis:  Good Assessment: 69 y.o. right handed female with history of hypertension, migraine headaches as well as multiple intracranial aneurysms with right MCA aneurysmal clipping 1993 as well as coiling 2010 without residual weakness.. Independent/driving prior to admission living with her husband. Presented 01/20/2015 with decreased balance as well as headache. She had been seen by interventional radiology in the past with plan for image guided cerebral arteriogram for findings a left MCA aneurysm. Underwent left MCA elective embolization using stent assistance 01/20/2015  per Intervention radiology. Patient was extubated noted left-sided tingling and weakness. Code stroke was called. CT of the head showed no acute stroke. CTA was then performed again showing no large  vessel occlusion. She was not a TPA candidate. Neurology consulted suspect non-dominant right brain infarct, embolic secondary to complication of interventional neuroradiology aneurysm embolization  Now requiring 24/7 Rehab RN,MD, as well as CIR level PT, OT and SLP.  Treatment team will focus on ADLs and mobility as well as left side awareness with goals set at Supervision   See Team Conference Notes for weekly updates to the plan of care

## 2015-01-25 NOTE — Progress Notes (Signed)
Physical Therapy Session Note  Patient Details  Name: Diane Hutchinson MRN: [id number] Date of Birth: 1946-01-16  Today's Date: 01/25/2015 PT Individual Time: 0900-0915 PT Individual Time Calculation (min): 15 min   Short Term Goals: Week 1:  PT Short Term Goal 1 (Week 1): Pt will increase transfers supine to edge of bed, edge of bed to supine to min A.  PT Short Term Goal 2 (Week 1): Pt will increase transfers bed to w/c, w/c to bed to mod A.  PT Short Term Goal 3 (Week 1): Pt will increase ambulation with LRAD for 25 feet with mod A.  PT Short Term Goal 4 (Week 1): Pt will propel w/c about 75 feet with S and verbal cues.  PT Short Term Goal 5 (Week 1): Pt will ascend/descend 2 stairs with 1 rail and max A.   Skilled Therapeutic Interventions/Progress Updates:  Patient received sitting in wheelchair. Session focused on wheelchair mobility with instruction in R hemi technique due to decreased sensation in LUE/LLE. Patient required total verbal/tactile cues to utilize RLE to steer wheelchair to maintain straight path x 180 ft with min-mod A and increased time. Patient repositioned back in chair with verbal cues for anterior weight shift and safe hand placement, handoff to PT.   Therapy Documentation Precautions:  Precautions Precautions: Fall Precaution Comments: L inattention Restrictions Weight Bearing Restrictions: No Pain: Pain Assessment Pain Assessment: 0-10 Pain Score: 5  Pain Type: Acute pain Pain Location: Leg Pain Orientation: Left Pain Descriptors / Indicators: Aching Pain Frequency: Intermittent Pain Onset: With Activity Patients Stated Pain Goal: 2 Pain Intervention(s): RN made aware;Repositioned  See FIM for current functional status  Therapy/Group: Individual Therapy  Laretta Alstrom 01/25/2015, 9:18 AM

## 2015-01-25 NOTE — Progress Notes (Signed)
Occupational Therapy Session Note  Patient Details  Name: Diane Hutchinson MRN: [id number] Date of Birth: 11/06/1946  Today's Date: 01/25/2015 OT Individual Time: 0800-0900 OT Individual Time Calculation (min): 60 min    Short Term Goals: Week 1:  OT Short Term Goal 1 (Week 1): Pt will complete squat-pivot transfer to drop arm BSC with min assist OT Short Term Goal 2 (Week 1): Pt will complete upper body dressing with supervision OT Short Term Goal 3 (Week 1): Pt will complete lower body dressing with mod assist OT Short Term Goal 4 (Week 1): Pt will complete 1 of 5 grooming tasks standing supported at sink with min guard assist OT Short Term Goal 5 (Week 1): Pt will demo ability to wash left lower leg with left hand using wash mit with supervision  Skilled Therapeutic Interventions/Progress Updates:    Pt resting in bed upon arrival and agreeable to therapy.  Pt declined shower and asked to wash up at sink.  Pt required max A for transfers, sit<>stand, and standing balance throughout session.  Pt noted with decreased proprioception and sensation in LUE/LLE with associated ataxic-like movements.  Pt initiates use of LUE during functional tasks but exhibits decreased strength and coordination/control to complete tasks thoroughly.  Pt requested use of toilet and required max A for toilet transfers.  Focus on activity tolerance, sit<>stand, functional transfers, standing balance, LUE use, and safety awareness.  Therapy Documentation Precautions:  Precautions Precautions: Fall Precaution Comments: L inattention Restrictions Weight Bearing Restrictions: No Pain: Pain Assessment Pain Assessment: 0-10 Pain Score: 5  Pain Type: Acute pain Pain Location: Leg Pain Orientation: Left Pain Descriptors / Indicators: Aching Pain Frequency: Intermittent Pain Onset: With Activity Patients Stated Pain Goal: 2 Pain Intervention(s): RN made aware;Repositioned  See FIM for current functional  status  Therapy/Group: Individual Therapy  Leroy Libman 01/25/2015, 9:00 AM

## 2015-01-25 NOTE — Progress Notes (Signed)
Physical Therapy Session Note  Patient Details  Name: Diane Hutchinson MRN: [id number] Date of Birth: Jan 18, 1946  Today's Date: 01/25/2015 PT Individual Time: 0915-1015 PT Individual Time Calculation (min): 60 min   Short Term Goals: Week 1:  PT Short Term Goal 1 (Week 1): Pt will increase transfers supine to edge of bed, edge of bed to supine to min A.  PT Short Term Goal 2 (Week 1): Pt will increase transfers bed to w/c, w/c to bed to mod A.  PT Short Term Goal 3 (Week 1): Pt will increase ambulation with LRAD for 25 feet with mod A.  PT Short Term Goal 4 (Week 1): Pt will propel w/c about 75 feet with S and verbal cues.  PT Short Term Goal 5 (Week 1): Pt will ascend/descend 2 stairs with 1 rail and max A.   Skilled Therapeutic Interventions/Progress Updates:   Pt received sitting in w/c, propelling to gym with make up session from other PT.  She continued to therapy gym while PT retrieved her shoes from room in order to work on standing.  Skilled session focused on static and dynamic standing with use of mirror for increased visual feedback for midline orientation.  Pt performed dynamic standing with placing horse shoes to basketball hoop to the L for increased activation and feedback.  Requires max to total A for dynamic standing progressing to mod A with weight shifting R and L with assist at L knee for stability as she continued to want to move LLE out of alignment despite max verbal cues.  Donned ace wrap for increased proprioceptive feedback progressing to donning 5lb weight to L ankle for feedback.  Eventually worked on static standing for midline orientation and RUE support.  She was able to progress to standing at min A with stability at L knee only.  Transferred w/c<>mat via squat pivot transfer at mod/max A level with facilitation for forward weight shift to elevate buttocks and increased WB through BLEs.  Once at mat, performed scooting forwards/backwards without LE or UE support  in order to work on trunk dissociation and trunk control.  Provided mod A with tactile cues for scooting and max to total A cues for correct technique as she tended to lean posteriorly during scooting.  Switched w/c to 20x18 (which is too large) but unable to locate better w/c at time.  Next PT notified of switching chair to better 18x18.  Pt transferred back to w/c as stated above.  Assisted back to room and left in w/c with all needs in reach.    Therapy Documentation Precautions:  Precautions Precautions: Fall Precaution Comments: L inattention Restrictions Weight Bearing Restrictions: No  Pain: Pain Assessment Pain Assessment: 0-10 Pain Score: 5  Pain Type: Acute pain Pain Location: Leg Pain Orientation: Left Pain Descriptors / Indicators: Aching Pain Frequency: Intermittent Pain Onset: With Activity Patients Stated Pain Goal: 2 Pain Intervention(s): RN made aware;Repositioned  See FIM for current functional status  Therapy/Group: Individual Therapy  [name] 01/25/2015, 12:00 PM

## 2015-01-25 NOTE — Progress Notes (Signed)
Physical Therapy Session Note  Patient Details  Name: Diane Hutchinson MRN: [id number] Date of Birth: [birth year]  Today's Date: 01/25/2015 PT Individual Time: 1100-1200 PT Individual Time Calculation (min): 60 min   Short Term Goals: Week 1:  PT Short Term Goal 1 (Week 1): Pt will increase transfers supine to edge of bed, edge of bed to supine to min A.  PT Short Term Goal 2 (Week 1): Pt will increase transfers bed to w/c, w/c to bed to mod A.  PT Short Term Goal 3 (Week 1): Pt will increase ambulation with LRAD for 25 feet with mod A.  PT Short Term Goal 4 (Week 1): Pt will propel w/c about 75 feet with S and verbal cues.  PT Short Term Goal 5 (Week 1): Pt will ascend/descend 2 stairs with 1 rail and max A.   Skilled Therapeutic Interventions/Progress Updates:    Pt received seated in w/; agreeable to therapy. Ace bandage at LLE throughout session to maintain L ankle dorsiflexion/eversion to maximize ankle stability during transfers. Session focused on modifying w/c size to promote pt independence with w/c mobility; increasing stability/independence with functional transfers; and increasing pt attention to/activation of LUE/LLE. Modified w/c twice during this session to ensure proper alignment, pt ability to reach floor with RLE. Pt performed w/c mobility 2 x25' in controlled environment with R hemi technique and min A, tactile cueing at R knee for increased weightbearing, mod cueing for attention to L visual field. Pt performed squat pivot transfers x2 trials from former w/c > modified w/c with max A, max cueing for setup, positioning, and attention to LUE/LLE; manual stabilization of L ankle to prevent excessive supination. Pt performed stand pivot transfers from w/c <> NuStep seat with max A, multimodal cueing as described above.   Pt performed NuStep with bilat UE/LE's x8 minutes total for increased attention to LUE/LLE during functional movement pattern. Maintained L hand position via  Well Grip. Resistance at level 7 for initial 3 minutes, level 9 for final 5 minutes. Theraband placed around bilat knees for tactile input at lateral L knee to prevent excessive L hip external rotation. Pt required frequent verbal reminders, manual approximation at L knee to continue LLE flexion/extension. Session ended in pt room, where pt was left seated in w/c with L half lap tray in place, all needs within reach, and lunch tray set up.  Therapy Documentation Precautions:  Precautions Precautions: Fall Precaution Comments: L inattention Restrictions Weight Bearing Restrictions: No Pain: Pain Assessment Pain Assessment: 0-10 Pain Score: 5  Pain Type: Acute pain Pain Location: Leg Pain Orientation: Left Pain Descriptors / Indicators: Aching Pain Frequency: Intermittent Pain Onset: With Activity Patients Stated Pain Goal: 2 Pain Intervention(s): RN made aware;Other (Comment) (RN premedicated) Locomotion : Wheelchair Mobility Distance: 25' x2   See FIM for current functional status  Therapy/Group: Individual Therapy  Hobble, Malva Cogan 01/25/2015, 12:21 PM

## 2015-01-25 NOTE — Progress Notes (Signed)
Social Work Assessment and Plan Social Work Assessment and Plan  Patient Details  Name: Diane Hutchinson MRN: [id number] Date of Birth: 12/19/1945  Today's Date: 01/25/2015  Problem List:  Patient Active Problem List   Diagnosis Date Noted  . Embolic cerebral infarction 01/22/2015  . Left hemiparesis 01/22/2015  . Stroke   . Cerebral embolism with cerebral infarction 01/21/2015  . Ataxia   . Brain aneurysm 01/20/2015  . TOBACCO ABUSE 01/04/2010  . BRONCHITIS, CHRONIC 01/04/2010  . CERVICAL CANCER 01/03/2010  . HYPERLIPIDEMIA 01/03/2010  . DEPRESSION 01/03/2010  . SUBARACHNOID HEMORRHAGE 01/03/2010  . C O P D 01/03/2010   Past Medical History:  Past Medical History  Diagnosis Date  . Migraine   . Depression   . Hypertension   . Stroke   . COPD (chronic obstructive pulmonary disease)   . Anxiety     h/o of panic attack  . GERD (gastroesophageal reflux disease)     occas. use of TUMS  . Arthritis     knees   Past Surgical History:  Past Surgical History  Procedure Laterality Date  . [place] surgery    . Cholecystectomy    . Appendectomy    . Eye surgery      laser to both eyes for blocked tear ducts   . Brain surgery  1993    aneurysm  . Tubal ligation    . Hemorroidectomy    . Radiology with anesthesia N/A 01/20/2015    Procedure: RADIOLOGY WITH ANESTHESIA;  Surgeon: Luanne Bras, MD;  Location: [facility];  Service: Radiology;  Laterality: N/A;   Social History:  reports that she has been smoking.  She has never used smokeless tobacco. She reports that she does not drink alcohol or use illicit drugs.  Family / Support Systems Marital Status: Married Patient Roles: Spouse, Parent Spouse/Significant Other: [name] [phone]  [phone]-cell Children: Ramona Trantham-daughter  [phone]-cell Other Supports: Two step children-one in [place] and one in [place] Anticipated Caregiver: Husband Ability/Limitations of Caregiver: husband to take [employer] Caregiver  Availability: 24/7 Family Dynamics: Close knit family but all are spread out, just she and her husband care for one another.  She states: " I should be taking care of him, not the other way around."  Pt is grateful for her husband and his dedication to her.  Social History Preferred language: English Religion: Baptist Cultural Background: No issues Education: [employer] Read: Yes Write: Yes Employment Status: Retired Freight forwarder Issues: No issues Guardian/Conservator: None-according to MD pt is capable of making her own decisions while here.  Will make sure husband is aware if any need to be made.   Abuse/Neglect Physical Abuse: Denies Verbal Abuse: Denies Sexual Abuse: Denies Exploitation of patient/patient's resources: Denies Self-Neglect: Denies  Emotional Status Pt's affect, behavior adn adjustment status: Pt is motivated to improve she has had other surgeries before but never had weakness after.  This is new for her and she is not happy about it.  She will work hard here and hopefully regian her independence before going home.  She is glad to be here on rehab and moving she feels better to be up moving then in a chair. Recent Psychosocial Issues: Other medical issues-oter annuerysm repairs Pyschiatric History: History of anxiety takes meds for and feels they help her.  She feels she is doing as well as can be expected and doesn't feel she eneds a depression screen at this time.  May benefit from Neuro-psych to see while  here. Substance Abuse History: Tobacco plans to quit now, aware of resources out there.  Patient / Family Perceptions, Expectations & Goals Pt/Family understanding of illness & functional limitations: Pt and husband have a good understanding of her condition and deficits.  She has to remind herself aobut her hand she has been hitting it on different things when it dangles. Both feel their questions are being answered and they are being heard.  Hopeful  for rehab. Premorbid pt/family roles/activities: Wife, Mother, grandmother, retiree, church member, etc Anticipated changes in roles/activities/participation: resume Pt/family expectations/goals: Pt states: " I want to be able to do as much as I can for myself before I leave here."  Husband states: " I hope she is moving but will help her all that I can."  [employer]: None Premorbid Home Care/DME Agencies: None Transportation available at discharge: family Resource referrals recommended: Neuropsychology, Support group (specify)  Discharge Planning Living Arrangements: Spouse/significant other Support Systems: Spouse/significant other, Children, Friends/neighbors, Immunologist, Other relatives Type of Residence: Private residence Insurance Resources: [employer], Multimedia programmer (specify) ([employer]) Museum/gallery curator Resources: Fish farm manager, Family Support Financial Screen Referred: No Living Expenses: Lives with family Money Management: Spouse, Patient Does the patient have any problems obtaining your medications?: No Home Management: Patient Patient/Family Preliminary Plans: Return home with husband who is planning on taking a FMLA to provide the care pt needs at discharge.  Husband is aware of the need to be here and participate in therapies with pt prior to discharge.  He just needs notice and can be here. Social Work Anticipated Follow Up Needs: HH/OP, Support Group  Clinical Impression Pleasant female who is willing to work and progress as much as she can prior to going home.  Her husband is supportive and willing to do what he can he is taking a FMLA and if necessary Will retire to provide the care pt requires.  Will await team conference Wed and work on a safe discharge plan.  [name] 01/25/2015, 11:51 AM

## 2015-01-25 NOTE — Progress Notes (Signed)
Patient information reviewed and entered into eRehab system by Seena Ritacco, RN, CRRN, PPS Coordinator.  Information including medical coding and functional independence measure will be reviewed and updated through discharge.     Per nursing patient was given "Data Collection Information Summary for Patients in Inpatient Rehabilitation Facilities with attached "Privacy Act Statement-Health Care Records" upon admission.  

## 2015-01-25 NOTE — Care Management Note (Signed)
[facility] Individual Statement of Services  Patient Name:  Diane Hutchinson  Date:  01/25/2015  Welcome to the [facility].  Our goal is to provide you with an individualized program based on your diagnosis and situation, designed to meet your specific needs.  With this comprehensive rehabilitation program, you will be expected to participate in at least 3 hours of rehabilitation therapies Monday-Friday, with modified therapy programming on the weekends.  Your rehabilitation program will include the following services:  Physical Therapy (PT), Occupational Therapy (OT), Speech Therapy (ST), 24 hour per day rehabilitation nursing, Therapeutic Recreaction (TR), Case Management (Social Worker), Rehabilitation Medicine, Nutrition Services and Pharmacy Services  Weekly team conferences will be held on Wednesday to discuss your progress.  Your Social Worker will talk with you frequently to get your input and to update you on team discussions.  Team conferences with you and your family in attendance may also be held.  Expected length of stay: 25-28 days  Overall anticipated outcome: supervision with cueing  Depending on your progress and recovery, your program may change. Your Social Worker will coordinate services and will keep you informed of any changes. Your Social Worker's name and contact numbers are listed  below.  The following services may also be recommended but are not provided by the [facility] will be made to provide these services after discharge if needed.  Arrangements include referral to agencies that provide these services.  Your insurance has been verified to be:  [place] primary doctor is:  Hasanaj  Pertinent information will be shared with your doctor and your insurance  company.  Social Worker:  Ovidio Kin, [place] or ([phone]  Information discussed with and copy given to patient by: [name], 01/25/2015, 9:50 AM

## 2015-01-25 NOTE — Progress Notes (Signed)
69 y.o. right handed female with history of hypertension, migraine headaches as well as multiple intracranial aneurysms with right MCA aneurysmal clipping 1993 as well as coiling 2010 without residual weakness.. Independent/driving prior to admission living with her husband. Presented 01/20/2015 with decreased balance as well as headache. She had been seen by interventional radiology in the past with plan for image guided cerebral arteriogram for findings a left MCA aneurysm. Underwent left MCA elective embolization using stent assistance 01/20/2015 per Intervention radiology. Patient was extubated noted left-sided tingling and weakness. Code stroke was called. CT of the head showed no acute stroke. CTA was then performed again showing no large vessel occlusion. She was not a TPA candidate. Neurology consulted suspect non-dominant right brain infarct, embolic secondary to complication of interventional neuroradiology aneurysm embolization. Echocardiogram is pending. She currently is maintained on aspirin and Plavix therapy  Subjective/Complaints: No issues overnite except poor sleep Pt states she has been on Xanax since 1993  Objective: Vital Signs: Blood pressure 160/71, pulse 84, temperature 98.8 F (37.1 C), temperature source Oral, resp. rate 18, SpO2 93 %. No results found. Results for orders placed or performed during the hospital encounter of 01/22/15 (from the past 72 hour(s))  CBC     Status: None   Collection Time: 01/22/15  6:21 PM  Result Value Ref Range   WBC 8.8 4.0 - 10.5 K/uL   RBC 4.18 3.87 - 5.11 MIL/uL   Hemoglobin 13.6 12.0 - 15.0 g/dL   HCT 39.6 36.0 - 46.0 %   MCV 94.7 78.0 - 100.0 fL   MCH 32.5 26.0 - 34.0 pg   MCHC 34.3 30.0 - 36.0 g/dL   RDW 12.6 11.5 - 15.5 %   Platelets 182 150 - 400 K/uL  Creatinine, serum     Status: Abnormal   Collection Time: 01/22/15  6:21 PM  Result Value Ref Range   Creatinine, Ser 0.78 0.50 - 1.10 mg/dL   GFR calc non Af Amer 84 (L) >90  mL/min   GFR calc Af Amer >90 >90 mL/min    Comment: (NOTE) The eGFR has been calculated using the CKD EPI equation. This calculation has not been validated in all clinical situations. eGFR's persistently <90 mL/min signify possible Chronic Kidney Disease.   CBC WITH DIFFERENTIAL     Status: None   Collection Time: 01/25/15  6:20 AM  Result Value Ref Range   WBC 7.9 4.0 - 10.5 K/uL   RBC 4.14 3.87 - 5.11 MIL/uL   Hemoglobin 13.5 12.0 - 15.0 g/dL   HCT 39.0 36.0 - 46.0 %   MCV 94.2 78.0 - 100.0 fL   MCH 32.6 26.0 - 34.0 pg   MCHC 34.6 30.0 - 36.0 g/dL   RDW 12.7 11.5 - 15.5 %   Platelets 218 150 - 400 K/uL   Neutrophils Relative % 57 43 - 77 %   Neutro Abs 4.5 1.7 - 7.7 K/uL   Lymphocytes Relative 31 12 - 46 %   Lymphs Abs 2.4 0.7 - 4.0 K/uL   Monocytes Relative 9 3 - 12 %   Monocytes Absolute 0.7 0.1 - 1.0 K/uL   Eosinophils Relative 3 0 - 5 %   Eosinophils Absolute 0.3 0.0 - 0.7 K/uL   Basophils Relative 0 0 - 1 %   Basophils Absolute 0.0 0.0 - 0.1 K/uL  Comprehensive metabolic panel     Status: Abnormal   Collection Time: 01/25/15  6:20 AM  Result Value Ref Range   Sodium 143  135 - 145 mmol/L   Potassium 3.1 (L) 3.5 - 5.1 mmol/L   Chloride 108 96 - 112 mmol/L   CO2 27 19 - 32 mmol/L   Glucose, Bld 136 (H) 70 - 99 mg/dL   BUN 8 6 - 23 mg/dL   Creatinine, Ser 0.66 0.50 - 1.10 mg/dL   Calcium 9.0 8.4 - 10.5 mg/dL   Total Protein 6.2 6.0 - 8.3 g/dL   Albumin 3.3 (L) 3.5 - 5.2 g/dL   AST 19 0 - 37 U/L   ALT 18 0 - 35 U/L   Alkaline Phosphatase 54 39 - 117 U/L   Total Bilirubin 0.7 0.3 - 1.2 mg/dL   GFR calc non Af Amer 89 (L) >90 mL/min   GFR calc Af Amer >90 >90 mL/min    Comment: (NOTE) The eGFR has been calculated using the CKD EPI equation. This calculation has not been validated in all clinical situations. eGFR's persistently <90 mL/min signify possible Chronic Kidney Disease.    Anion gap 8 5 - 15     HEENT: normal Cardio: RRR and no murmur Resp: CTA B/L  and unlabored GI: BS positive and NT, ND Extremity:  Pulses positive and No Edema Skin:   Bruise multiple bruises LUE>LLE Neuro: Alert/Oriented, Abnormal Sensory Absent LT and proprio LUE.LLE, Abnormal Motor 3/5 LUE, 4/5 LLE with poor motor control but without hyperreflexia and Abnormal FMC Ataxic/ dec FMC Musc/Skel:  Swelling Dorsum Left hand without pain during ROM Gen NAD   Assessment/Plan: 1. Functional deficits secondary to Right MCA infarct with left hemiparesis which require 3+ hours per day of interdisciplinary therapy in a comprehensive inpatient rehab setting. Physiatrist is providing close team supervision and 24 hour management of active medical problems listed below. Physiatrist and rehab team continue to assess barriers to discharge/monitor patient progress toward functional and medical goals. FIM: FIM - Bathing Bathing Steps Patient Completed: Chest, Left Arm, Abdomen, Front perineal area, Right upper leg, Left upper leg, Right lower leg (including foot) Bathing: 3: Mod-Patient completes 5-7 37f10 parts or 50-74%  FIM - Upper Body Dressing/Undressing Upper body dressing/undressing steps patient completed: Put head through opening of pull over shirt/dress Upper body dressing/undressing: 2: Max-Patient completed 25-49% of tasks FIM - Lower Body Dressing/Undressing Lower body dressing/undressing steps patient completed: Thread/unthread right pants leg, Thread/unthread left pants leg Lower body dressing/undressing: 2: Max-Patient completed 25-49% of tasks  FIM - Toileting Toileting steps completed by patient: Performs perineal hygiene Toileting: 1: Two helpers  FIM - TRadio producerDevices: Grab bars Toilet Transfers: 2-From toilet/BSC: Max A (lift and lower assist)  FIM - BEngineer, siteAssistive Devices: Arm rests Bed/Chair Transfer: 2: Supine > Sit: Max A (lifting assist/Pt. 25-49%), 2: Chair or W/C > Bed: Max A  (lift and lower assist) (supine to sit to L side )  FIM - Locomotion: Wheelchair Distance: 25 Locomotion: Wheelchair: 1: Travels less than 50 ft with minimal assistance (Pt.>75%) FIM - Locomotion: Ambulation Locomotion: Ambulation Assistive Devices: Other (comment) (R hallway rail) Ambulation/Gait Assistance: 2: Max assist Locomotion: Ambulation: 1: Travels less than 50 ft with maximal assistance (Pt: 25 - 49%)  Comprehension Comprehension Mode: Auditory Comprehension: 6-Follows complex conversation/direction: With extra time/assistive device  Expression Expression Mode: Verbal Expression Assistive Devices: 6-Talk trach valve Expression: 6-Expresses complex ideas: With extra time/assistive device  Social Interaction Social Interaction: 6-Interacts appropriately with others with medication or extra time (anti-anxiety, antidepressant).  Problem Solving Problem Solving: 6-Solves complex problems: With extra  time  Memory Memory: 6-More than reasonable amt of time  Medical Problem List and Plan: 1. 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  DVT Prophylaxis/Anticoagulation: SQ Lovenox.monitor for any bleeding episodes 3. Pain Management: tylenol as needed 4. Mood/depression/anxiety: Risperdal 0.5 mg QHS/Desyrel 150 mg QHS,Zoloft 100 mg daily,Xanax 1 mg TID as needed. Team to provide ego-support as well. 5. Neuropsych: This patient is capable of making decisions on her own behalf. 6. Skin/Wound Care: Routine skin checks 7. Fluids/Electrolytes/Nutrition: Strict I & O.Follow up labs 8.Hyperlipidemia.Lipitor/Lovaza  LOS (Days) 3 A FACE TO FACE EVALUATION WAS PERFORMED  Saharah Sherrow E 01/25/2015, 8:45 AM

## 2015-01-26 ENCOUNTER — Inpatient Hospital Stay (HOSPITAL_COMMUNITY): Payer: Medicare Other | Admitting: Rehabilitation

## 2015-01-26 ENCOUNTER — Inpatient Hospital Stay (HOSPITAL_COMMUNITY): Payer: Medicare Other | Admitting: Occupational Therapy

## 2015-01-26 ENCOUNTER — Inpatient Hospital Stay (HOSPITAL_COMMUNITY): Payer: Medicare Other

## 2015-01-26 NOTE — Progress Notes (Signed)
Referring Physician(s): Dr Naaman Plummer  Subjective:  L MCA aneurysm stent placement in IR with Dr Estanislado Pandy 01/20/15 Post procedure CVA Pt doing well Rt side usefulness is great Lt side still slow to no movement Pleasant Up in chair   Allergies: Codeine; Meperidine hcl; Morphine; and Sulfonamide derivatives  Medications: Prior to Admission medications   Medication Sig Start Date End Date Taking? Authorizing Provider  acetaminophen (TYLENOL) 500 MG tablet Take 2 tablets (1,000 mg total) by mouth every 6 (six) hours as needed (increased temp or pain). 01/22/15   Hedy Jacob, PA-C  albuterol (PROVENTIL) (2.5 MG/3ML) 0.083% nebulizer solution Take 2.5 mg by nebulization every 6 (six) hours as needed for wheezing or shortness of breath.    Historical Provider, MD  alendronate (FOSAMAX) 70 MG tablet Take 70 mg by mouth every Monday. Take with a full glass of water on an empty stomach.    Historical Provider, MD  ALPRAZolam Duanne Moron) 1 MG tablet Take 1 mg by mouth 3 (three) times daily as needed for anxiety.    Historical Provider, MD  aspirin 325 MG tablet Take 1 tablet (325 mg total) by mouth daily with breakfast. 01/22/15   Hedy Jacob, PA-C  atorvastatin (LIPITOR) 40 MG tablet Take 40 mg by mouth daily.    Historical Provider, MD  Cholecalciferol (VITAMIN D PO) Take 1 tablet by mouth daily.    Historical Provider, MD  clopidogrel (PLAVIX) 75 MG tablet Take 75 mg by mouth daily.    Historical Provider, MD  gemfibrozil (LOPID) 600 MG tablet Take 600 mg by mouth 2 (two) times daily before a meal.    Historical Provider, MD  ipratropium (ATROVENT) 0.02 % nebulizer solution Take 0.5 mg by nebulization every 6 (six) hours as needed for wheezing or shortness of breath.     Historical Provider, MD  meloxicam (MOBIC) 15 MG tablet Take 15 mg by mouth daily.    Historical Provider, MD  metoprolol succinate (TOPROL XL) 25 MG 24 hr tablet Take 25 mg by mouth at bedtime.     Historical Provider, MD    Omega-3 Fatty Acids (FISH OIL PO) Take 1 capsule by mouth daily.    Historical Provider, MD  risperiDONE (RISPERDAL) 0.5 MG tablet Take 0.5 mg by mouth at bedtime.    Historical Provider, MD  sertraline (ZOLOFT) 100 MG tablet Take 200 mg by mouth at bedtime.  11/16/14   Historical Provider, MD  traZODone (DESYREL) 150 MG tablet Take 300 mg by mouth at bedtime.    Historical Provider, MD  VITAMIN E PO Take 1 tablet by mouth daily.    Historical Provider, MD     Vital Signs: BP 139/47 mmHg  Pulse 76  Temp(Src) 98.2 F (36.8 C) (Oral)  Resp 17  SpO2 98%  Physical Exam  Constitutional: She is oriented to person, place, and time. She appears well-nourished.  Musculoskeletal:  Moving Rt arm and leg to command Using Rt side well No movement on Left arm Does have an occasional spontaneous jerk of Left hand/arm--not witnessed Left leg she can move slowly  Neurological: She is alert and oriented to person, place, and time.  Skin: Skin is warm and dry.  Psychiatric: She has a normal mood and affect. Her behavior is normal. Judgment and thought content normal.  Nursing note and vitals reviewed.   Imaging: No results found.  Labs:  CBC:  Recent Labs  01/21/15 0505 01/21/15 1145 01/22/15 1821 01/25/15 0620  WBC 12.1* 12.3* 8.8  7.9  HGB 11.9* 12.7 13.6 13.5  HCT 34.8* 37.3 39.6 39.0  PLT 187 189 182 218    COAGS:  Recent Labs  01/18/15 1308  INR 0.95  APTT 31    BMP:  Recent Labs  11/24/14 1450 01/18/15 1308 01/21/15 0505 01/21/15 1145 01/22/15 1821 01/25/15 0620  NA 143 140 141  --   --  143  K 4.0 3.8 3.8  --   --  3.1*  CL 103 105 108  --   --  108  CO2 25 26 29   --   --  27  GLUCOSE 146* 181* 105*  --   --  136*  BUN 18 12 7   --   --  8  CALCIUM 9.8 8.6 7.9*  --   --  9.0  CREATININE 0.92 1.05 0.69 0.84 0.78 0.66  GFRNONAA 64 53* 87* 70* 84* 89*  GFRAA 74 62* >90 81* >90 >90    LIVER FUNCTION TESTS:  Recent Labs  01/18/15 1308 01/25/15 0620   BILITOT 0.7 0.7  AST 19 19  ALT 23 18  ALKPHOS 94 54  PROT 6.4 6.2  ALBUMIN 3.9 3.3*    Assessment and Plan:  L middle cerebral artery aneurysm--stent placed 3/9 Post procedure CVA event Now in Rehab Doing well Will report to Dr Estanislado Pandy  Signed: Monia Sabal A 01/26/2015, 11:20 AM   I spent a total of 15 Minutes in face to face in clinical consultation/evaluation, greater than 50% of which was counseling/coordinating care for L MCA aneurysm stent/ post procedure CVA

## 2015-01-26 NOTE — Progress Notes (Signed)
Occupational Therapy Session Note  Patient Details  Name: Diane Hutchinson MRN: [id number] Date of Birth: 09/18/1946  Today's Date: 01/26/2015 OT Individual Time: 1300-1400 OT Individual Time Calculation (min): 60 min   Skilled Therapeutic Interventions/Progress Updates:    Pt began session working on toilet transfer and toileting as well as changing brief, in preparation for transition to the gym.   She needed max assist for stand pivot transfer to the 3:1 over the toilet.  Max facilitation needed for placing the LLE as well as controlling the left knee and hip, as pt does not possess proprioception and control to consistently activate it.  Transitioned via wheelchair down to the therapy gym with transfer stand pivot to the mat.  Focused on educating pt and her spouse on steps to complete transfer, including positioning the LLE and scooting forward.  Also educated pt that she needs sustained visual attention to the hand or arm when attempting functional use or weightbearing.  She worked on weightbearing over the LUE with transitions to the left side requiring activation of elbow extension to return to midline.  Pt inconsistent with her ability to perform this ranging from min to mod facilitation.  Transitioned to supine as well with use of ball to work on active control of the LUE.  Ace wrapped the LUE and RUE to incorporate bilateral shoulder movement and sustained contraction to hold the ball up.  Pt needing mod assist to keep the ball at midline secondary to ataxic movement.  Stressed concentrating on the feeling of straightening the left arm and the feed back she is getting in the elbow and wrist from a sensory perspective during movement.  Transferred back to the wheelchair with max assist with transition back to the room .   Therapy Documentation Precautions:  Precautions Precautions: Fall Precaution Comments: L inattention Restrictions Weight Bearing Restrictions: No  Pain: Pain  Assessment Pain Assessment: Faces Pain Score: 9  Faces Pain Scale: Hurts a little bit Pain Type: Acute pain Pain Location: Arm Pain Orientation: Left Pain Descriptors / Indicators: Stabbing (with stretching) Pain Onset: With Activity Pain Intervention(s): Repositioned ADL: See FIM for current functional status  Therapy/Group: Individual Therapy  Torrion Witter OTR/L 01/26/2015, 3:41 PM

## 2015-01-26 NOTE — Progress Notes (Signed)
Recreational Therapy Assessment and Plan  Patient Details  Name: Diane Hutchinson MRN: [id number] Date of Birth: 19-Mar-1946 Today's Date: 01/26/2015  Rehab Potential: Good ELOS: 3 weeks   Assessment Clinical Impression:  Problem List:  Patient Active Problem List   Diagnosis Date Noted  . Embolic cerebral infarction 01/22/2015  . Left hemiparesis 01/22/2015  . Stroke   . Cerebral embolism with cerebral infarction 01/21/2015  . Ataxia   . Brain aneurysm 01/20/2015  . TOBACCO ABUSE 01/04/2010  . BRONCHITIS, CHRONIC 01/04/2010  . CERVICAL CANCER 01/03/2010  . HYPERLIPIDEMIA 01/03/2010  . DEPRESSION 01/03/2010  . SUBARACHNOID HEMORRHAGE 01/03/2010  . C O P D 01/03/2010    Past Medical History:  Past Medical History  Diagnosis Date  . Migraine   . Depression   . Hypertension   . Stroke   . COPD (chronic obstructive pulmonary disease)   . Anxiety     h/o of panic attack  . GERD (gastroesophageal reflux disease)     occas. use of TUMS  . Arthritis     knees   Past Surgical History:  Past Surgical History  Procedure Laterality Date  . [place] surgery    . Cholecystectomy    . Appendectomy    . Eye surgery      laser to both eyes for blocked tear ducts   . Brain surgery  1993    aneurysm  . Tubal ligation    . Hemorroidectomy    . Radiology with anesthesia N/A 01/20/2015    Procedure: RADIOLOGY WITH ANESTHESIA; Surgeon: Luanne Bras, MD; Location: [facility]; Service: Radiology; Laterality: N/A;    Assessment & Plan Clinical Impression: Patient is a 69 y.o. year old female with history of hypertension, migraine headaches as well as multiple intracranial aneurysms with right MCA aneurysmal clipping 1993 as well as coiling 2010 without residual weakness.. Independent/driving prior to admission living with her husband. Presented 01/20/2015  with decreased balance as well as headache. She had been seen by interventional radiology in the past with plan for image guided cerebral arteriogram for findings a left MCA aneurysm. Underwent left MCA elective embolization using stent assistance 01/20/2015 per Intervention radiology. Patient was extubated noted left-sided tingling and weakness. Code stroke was called. CT of the head showed no acute stroke. CTA was then performed again showing no large vessel occlusion. She was not a TPA candidate. Neurology consulted suspect non-dominant right brain infarct, embolic secondary to complication of interventional neuroradiology aneurysm embolization. Echocardiogram is pending. She currently is maintained on aspirin and Plavix therapy. Subcutaneous Lovenox added for DVT prophylaxis. Bouts of restlessness and agitation her Risperdal and Desyrel as prior to admission was resumed. Physical therapy evaluation completed 01/21/2015 with recommendations of physical medicine rehabilitation consult. Patient transferred to CIR on 01/22/2015 .     Pt presents with decreased activity tolerance, decreased functional mobility, decreased balance, left sided weakness, Limiting pt's independence with leisure/community pursuits.   Leisure History/Participation Premorbid leisure interest/current participation: Medical laboratory scientific officer - Building control surveyor - Shopping mall Other Leisure Interests: Television Leisure Participation Style: With Family/Friends;Alone Psychosocial / Spiritual Stress Management: Poor Does patient have pets?: Yes Social interaction - Mood/Behavior: Cooperative Academic librarian Appropriate for Education?: Yes Recreational Therapy Orientation Orientation -Reviewed with patient: Available activity resources Strengths/Weaknesses Patient Strengths/Abilities: Willingness to participate Patient weaknesses: Physical limitations TR Patient demonstrates impairments in the following area(s):  Endurance;Motor;Pain;Perception;Safety;Skin Integrity  Plan Rec Therapy Plan Is patient appropriate for Therapeutic Recreation?: Yes Rehab Potential: Good Treatment times per week: Min1time per week >20  minutes Estimated Length of Stay: 3 weeks TR Treatment/Interventions: Adaptive equipment instruction;1:1 session;Balance/vestibular training;Functional mobility training;Community reintegration;Cognitive remediation/compensation;Patient/family education;Therapeutic activities;Recreation/leisure participation;Therapeutic exercise;UE/LE Coordination activities;Wheelchair propulsion/positioning  Recommendations for other services: None  Discharge Criteria: Patient will be discharged from TR if patient refuses treatment 3 consecutive times without medical reason.  If treatment goals not met, if there is a change in medical status, if patient makes no progress towards goals or if patient is discharged from hospital.  The above assessment, treatment plan, treatment alternatives and goals were discussed and mutually agreed upon: by patient  [place] 01/26/2015, 3:30 PM

## 2015-01-26 NOTE — Progress Notes (Signed)
Physical Therapy Session Note  Patient Details  Name: Diane Hutchinson MRN: [id number] Date of Birth: [birth year]  Today's Date: 01/26/2015 PT Individual Time: 0830-0930 PT Individual Time Calculation (min): 60 min   Short Term Goals: Week 1:  PT Short Term Goal 1 (Week 1): Pt will increase transfers supine to edge of bed, edge of bed to supine to min A.  PT Short Term Goal 2 (Week 1): Pt will increase transfers bed to w/c, w/c to bed to mod A.  PT Short Term Goal 3 (Week 1): Pt will increase ambulation with LRAD for 25 feet with mod A.  PT Short Term Goal 4 (Week 1): Pt will propel w/c about 75 feet with S and verbal cues.  PT Short Term Goal 5 (Week 1): Pt will ascend/descend 2 stairs with 1 rail and max A.   Skilled Therapeutic Interventions/Progress Updates:   Pt received sitting in w/c in room, MD present for assessment.  Discussed good strength in LLE, however is extremely limited by decreased sensation and proprioception.  Also requested that pt get an air cast for LLE to prevent ankle inversion during standing and gait.  MD to order.  Skilled session focused on w/c mobility with R hemi technique for coordination and tall kneeling tasks for NMR for trunk, LUE and LLE.  Pt requires min/mod A for w/c propulsion due to inability to coordinate LE with UE.  Once in therapy gym, assisted into tall kneeling with +2A in order to address midline orientation, trunk control, WB through LLE/LUE and weight shifting to decrease pushing.  RT with PT during session to assess pts interests while addressing these things in tall kneeling.  Pt able to maintain tall kneeling statically at min/guard to S level with intermittent facilitation for upright chest and to increase R trunk rotation.  Pt needing rest break, therefore assisted into R SL position.  Demonstrated how to assist back to quadruped>tall kneeling position, however pt unable to fully elevate hips, despite assist and then returned to prone.  Once  in prone, was able to assist pt back to quadruped>tall kneeling in order to then work on weight shifting R and L, as well as superiorly.  Note that pt is beginning to demonstrate pushing to the L and needing max A to shift over to R LE during reaching tasks.  Assisted pt back to SL>supine>sitting with +2 A.  Ambulated x 20' with +2A ("three muskateer style") to w/c.  Note pt with severe L ankle inversion requiring assist with each L step.  Also requires max to total cues for waiting until L LE stabilized until advancing RLE.  Pt assisted back to room and left in w/c with all needs in reach.   Therapy Documentation Precautions:  Precautions Precautions: Fall Precaution Comments: L inattention Restrictions Weight Bearing Restrictions: No   Vital Signs: Therapy Vitals Temp: 98.2 F (36.8 C) Temp Source: Oral Pulse Rate: 76 Resp: 17 BP: (!) 139/47 mmHg Patient Position (if appropriate): Lying Oxygen Therapy SpO2: 98 % O2 Device: Not Delivered Pain: Pain Assessment Pain Assessment: No/denies pain   Locomotion : Ambulation Ambulation/Gait Assistance: 1: +2 Total assist Wheelchair Mobility Distance: 60   See FIM for current functional status  Therapy/Group: Co-Treatment w/ RT  [name] 01/26/2015, 9:39 AM

## 2015-01-26 NOTE — Progress Notes (Signed)
Occupational Therapy Session Note  Patient Details  Name: Diane Hutchinson MRN: [id number] Date of Birth: 08-04-1946  Today's Date: 01/26/2015 OT Individual Time: 0700-0800 OT Individual Time Calculation (min): 60 min    Short Term Goals: Week 1:  OT Short Term Goal 1 (Week 1): Pt will complete squat-pivot transfer to drop arm BSC with min assist OT Short Term Goal 2 (Week 1): Pt will complete upper body dressing with supervision OT Short Term Goal 3 (Week 1): Pt will complete lower body dressing with mod assist OT Short Term Goal 4 (Week 1): Pt will complete 1 of 5 grooming tasks standing supported at sink with min guard assist OT Short Term Goal 5 (Week 1): Pt will demo ability to wash left lower leg with left hand using wash mit with supervision  Skilled Therapeutic Interventions/Progress Updates:    Pt asleep in bed upon arrival but easily aroused.  Pt agreeable to therapy but declined bathing at shower level.  Pt engaged in BADL retraining including bathing and dressing with sit<>stand from w/c at sink. Pt performed supine->sit EOB with supervision using bed rails but required max A to scoot laterally in preparation for squat pivot transfer to w/c.  Pt declined bathing LB this morning stating that nursing had already "cleaned" her up.  Pt was wearing house coat/dress and performed UB bathing this morning without removing dress.  Pt engaged in reaching for grooming articles on sink with LUE with max A/facilitation for control.  Educated patient on importance of looking at [place] when engaged in functional tasks.  Pt verbalized understanding.  Pt engaged in sit<>stand and standing tasks at sink with focus on LLE control and weight shifts.   Therapy Documentation Precautions:  Precautions Precautions: Fall Precaution Comments: L inattention Restrictions Weight Bearing Restrictions: No Pain: Pain Assessment Pain Assessment: No/denies pain  See FIM for current functional  status  Therapy/Group: Individual Therapy  Leroy Libman 01/26/2015, 8:03 AM

## 2015-01-26 NOTE — Progress Notes (Signed)
69 y.o. right handed female with history of hypertension, migraine headaches as well as multiple intracranial aneurysms with right MCA aneurysmal clipping 1993 as well as coiling 2010 without residual weakness.. Independent/driving prior to admission living with her husband. Presented 01/20/2015 with decreased balance as well as headache. She had been seen by interventional radiology in the past with plan for image guided cerebral arteriogram for findings a left MCA aneurysm. Underwent left MCA elective embolization using stent assistance 01/20/2015 per Intervention radiology. Patient was extubated noted left-sided tingling and weakness. Code stroke was called. CT of the head showed no acute stroke. CTA was then performed again showing no large vessel occlusion. She was not a TPA candidate. Neurology consulted suspect non-dominant right brain infarct, embolic secondary to complication of interventional neuroradiology aneurysm embolization. Echocardiogram is pending. She currently is maintained on aspirin and Plavix therapy  Subjective/Complaints: Good sleep Pt states she has been on Xanax since 1993  Objective: Vital Signs: Blood pressure 139/47, pulse 76, temperature 98.2 F (36.8 C), temperature source Oral, resp. rate 17, SpO2 98 %. No results found. Results for orders placed or performed during the hospital encounter of 01/22/15 (from the past 72 hour(s))  CBC WITH DIFFERENTIAL     Status: None   Collection Time: 01/25/15  6:20 AM  Result Value Ref Range   WBC 7.9 4.0 - 10.5 K/uL   RBC 4.14 3.87 - 5.11 MIL/uL   Hemoglobin 13.5 12.0 - 15.0 g/dL   HCT 39.0 36.0 - 46.0 %   MCV 94.2 78.0 - 100.0 fL   MCH 32.6 26.0 - 34.0 pg   MCHC 34.6 30.0 - 36.0 g/dL   RDW 12.7 11.5 - 15.5 %   Platelets 218 150 - 400 K/uL   Neutrophils Relative % 57 43 - 77 %   Neutro Abs 4.5 1.7 - 7.7 K/uL   Lymphocytes Relative 31 12 - 46 %   Lymphs Abs 2.4 0.7 - 4.0 K/uL   Monocytes Relative 9 3 - 12 %   Monocytes  Absolute 0.7 0.1 - 1.0 K/uL   Eosinophils Relative 3 0 - 5 %   Eosinophils Absolute 0.3 0.0 - 0.7 K/uL   Basophils Relative 0 0 - 1 %   Basophils Absolute 0.0 0.0 - 0.1 K/uL  Comprehensive metabolic panel     Status: Abnormal   Collection Time: 01/25/15  6:20 AM  Result Value Ref Range   Sodium 143 135 - 145 mmol/L   Potassium 3.1 (L) 3.5 - 5.1 mmol/L   Chloride 108 96 - 112 mmol/L   CO2 27 19 - 32 mmol/L   Glucose, Bld 136 (H) 70 - 99 mg/dL   BUN 8 6 - 23 mg/dL   Creatinine, Ser 0.66 0.50 - 1.10 mg/dL   Calcium 9.0 8.4 - 10.5 mg/dL   Total Protein 6.2 6.0 - 8.3 g/dL   Albumin 3.3 (L) 3.5 - 5.2 g/dL   AST 19 0 - 37 U/L   ALT 18 0 - 35 U/L   Alkaline Phosphatase 54 39 - 117 U/L   Total Bilirubin 0.7 0.3 - 1.2 mg/dL   GFR calc non Af Amer 89 (L) >90 mL/min   GFR calc Af Amer >90 >90 mL/min    Comment: (NOTE) The eGFR has been calculated using the CKD EPI equation. This calculation has not been validated in all clinical situations. eGFR's persistently <90 mL/min signify possible Chronic Kidney Disease.    Anion gap 8 5 - 15  HEENT: normal Cardio: RRR and no murmur Resp: CTA B/L and unlabored GI: BS positive and NT, ND Extremity:  Pulses positive and No Edema Skin:   Bruise multiple bruises LUE>LLE Neuro: Alert/Oriented, Abnormal Sensory Absent LT and proprio LUE.LLE, Abnormal Motor 3/5 LUE, 4/5 LLE with poor motor control but without hyperreflexia and Abnormal FMC Ataxic/ dec FMC Musc/Skel:  Swelling Dorsum Left hand without pain during ROM Gen NAD   Assessment/Plan: 1. Functional deficits secondary to Right MCA infarct with left hemiparesis which require 3+ hours per day of interdisciplinary therapy in a comprehensive inpatient rehab setting. Physiatrist is providing close team supervision and 24 hour management of active medical problems listed below. Physiatrist and rehab team continue to assess barriers to discharge/monitor patient progress toward functional and  medical goals. FIM: FIM - Bathing Bathing Steps Patient Completed: Chest, Right Arm, Left Arm Bathing: 2: Max-Patient completes 3-4 73f10 parts or 25-49% (pt declined LB bathing this morning)  FIM - Upper Body Dressing/Undressing Upper body dressing/undressing steps patient completed: Thread/unthread right sleeve of pullover shirt/dresss, Put head through opening of pull over shirt/dress, Pull shirt over trunk Upper body dressing/undressing: 0: Activity did not occur (pt declined changing house dress this morning) FIM - Lower Body Dressing/Undressing Lower body dressing/undressing steps patient completed: Thread/unthread right pants leg, Thread/unthread left pants leg Lower body dressing/undressing: 0: Activity did not occur  FIM - Toileting Toileting steps completed by patient: Performs perineal hygiene Toileting: 2: Max-Patient completed 1 of 3 steps  FIM - TRadio producerDevices: Grab bars Toilet Transfers: 2-To toilet/BSC: Max A (lift and lower assist), 2-From toilet/BSC: Max A (lift and lower assist)  FIM - BEngineer, siteAssistive Devices: Arm rests (Ace bandage for L ankle stability) Bed/Chair Transfer: 2: Chair or W/C > Bed: Max A (lift and lower assist), 2: Bed > Chair or W/C: Max A (lift and lower assist) (w/c <> NuStep seat)  FIM - Locomotion: Wheelchair Distance: 25' x2 Locomotion: Wheelchair: 1: Travels less than 50 ft with minimal assistance (Pt.>75%) FIM - Locomotion: Ambulation Locomotion: Ambulation Assistive Devices: Other (comment) (R hallway rail) Ambulation/Gait Assistance: 2: Max assist Locomotion: Ambulation: 0: Activity did not occur  Comprehension Comprehension Mode: Auditory Comprehension: 6-Follows complex conversation/direction: With extra time/assistive device  Expression Expression Mode: Verbal Expression Assistive Devices: 6-Talk trach valve Expression: 6-Expresses complex ideas: With extra  time/assistive device  Social Interaction Social Interaction: 6-Interacts appropriately with others with medication or extra time (anti-anxiety, antidepressant).  Problem Solving Problem Solving: 6-Solves complex problems: With extra time  Memory Memory: 6-More than reasonable amt of time  Medical Problem List and Plan: 1. 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  DVT Prophylaxis/Anticoagulation: SQ Lovenox.monitor for any bleeding episodes 3. Pain Management: tylenol as needed 4. Mood/depression/anxiety: Risperdal 0.5 mg QHS/Desyrel 150 mg QHS,Zoloft 100 mg daily,Xanax 1 mg TID as needed. Team to provide ego-support as well. 5. Neuropsych: This patient is capable of making decisions on her own behalf. 6. Skin/Wound Care: Routine skin checks 7. Fluids/Electrolytes/Nutrition: Strict I & O.Follow up labs 8.Hyperlipidemia.Lipitor/Lovaza  LOS (Days) 4 A FACE TO FACE EVALUATION WAS PERFORMED  Diane Hutchinson 01/26/2015, 8:26 AM

## 2015-01-26 NOTE — Progress Notes (Signed)
Nutrition Brief Note  Patient identified on the Malnutrition Screening Tool (MST) Report  Wt Readings from Last 15 Encounters:  01/20/15 178 lb (80.74 kg)  11/24/14 179 lb 6.4 oz (81.375 kg)  01/04/10 170 lb (77.111 kg)   Weight has been stable with usual body weight of 178 lbs.  Current diet order is regular, patient is consuming approximately 50-100% (most meals at 100%) at this time. Pt reports having a good appetite currently and PTA with no other difficulties. Labs and medications reviewed.   No nutrition interventions warranted at this time. If nutrition issues arise, please consult RD.   Kallie Locks, MS, RD, LDN Pager # [phone] After hours/ weekend pager # [phone]

## 2015-01-27 ENCOUNTER — Inpatient Hospital Stay (HOSPITAL_COMMUNITY): Payer: Medicare Other | Admitting: Rehabilitation

## 2015-01-27 ENCOUNTER — Inpatient Hospital Stay (HOSPITAL_COMMUNITY): Payer: Medicare Other | Admitting: Occupational Therapy

## 2015-01-27 LAB — BASIC METABOLIC PANEL
ANION GAP: 7 (ref 5–15)
BUN: 10 mg/dL (ref 6–23)
CO2: 28 mmol/L (ref 19–32)
Calcium: 9.2 mg/dL (ref 8.4–10.5)
Chloride: 107 mmol/L (ref 96–112)
Creatinine, Ser: 0.69 mg/dL (ref 0.50–1.10)
GFR, EST NON AFRICAN AMERICAN: 87 mL/min — AB (ref >90)
Glucose, Bld: 125 mg/dL — ABNORMAL HIGH (ref 70–99)
POTASSIUM: 3.8 mmol/L (ref 3.5–5.1)
SODIUM: 142 mmol/L (ref 135–145)

## 2015-01-27 MED ORDER — HYDROCORTISONE 0.5 % EX CREA: TOPICAL_CREAM | Freq: Two times a day (BID) | CUTANEOUS | Status: DC

## 2015-01-27 MED ORDER — ALUM & MAG HYDROXIDE-SIMETH 200-200-20 MG/5ML PO SUSP: 30.0000 mL | Freq: Four times a day (QID) | ORAL | Status: DC | PRN

## 2015-01-27 NOTE — Progress Notes (Signed)
Physical Therapy Session Note  Patient Details  Name: Diane Hutchinson MRN: [id number] Date of Birth: January 02, 1946  Today's Date: 01/27/2015 PT Individual Time: 0930-1000 PT Individual Time Calculation (min): 30 min   Short Term Goals: Week 1:  PT Short Term Goal 1 (Week 1): Pt will increase transfers supine to edge of bed, edge of bed to supine to min A.  PT Short Term Goal 2 (Week 1): Pt will increase transfers bed to w/c, w/c to bed to mod A.  PT Short Term Goal 3 (Week 1): Pt will increase ambulation with LRAD for 25 feet with mod A.  PT Short Term Goal 4 (Week 1): Pt will propel w/c about 75 feet with S and verbal cues.  PT Short Term Goal 5 (Week 1): Pt will ascend/descend 2 stairs with 1 rail and max A.   Skilled Therapeutic Interventions/Progress Updates:   Pt received sitting in w/c in room, agreeable to therapy session.  Skilled session with RT in order to focus on dynamic standing with use of stedy for WB through LLE, upright posture, orientation to midline and reaching to the R to decrease pusher tendencies.  RT assisted with pt retrieving bean bags to the far R with facilitation from therapist at L knee for knee extension, as well as at trunk and glutes for upright posture.  Noted marked improvement from yesterday in weight shift to the R and great activation of LLE during these reaching tasks.  Pt able to maintain standing x 18 mins with ace wrap donned on L ankle and knee for proprioceptive input as well as to stabilize L ankle to prevent over inversion.  Pt assisted back to w/c and back to room.  Left with all needs in reach and L lap tray donned.   Therapy Documentation Precautions:  Precautions Precautions: Fall Precaution Comments: L inattention Restrictions Weight Bearing Restrictions: No  Pain: Pain Assessment Pain Assessment: No/denies pain Pain Score: 0-No pain  See FIM for current functional status  Therapy/Group: Individual Therapy and co-treat with  RT  [name] 01/27/2015, 12:31 PM

## 2015-01-27 NOTE — Progress Notes (Signed)
Recreational Therapy Session Note  Patient Details  Name: Diane Hutchinson MRN: [id number] Date of Birth: 03-May-1946 Today's Date: 01/27/2015  Pain: no c/o Skilled Therapeutic Interventions/Progress Updates: Session focused on on activity tolerance, dynamic standing balance, midline stance during co-treat with PT.  Pt stood in steady reaching to the right for to facilitate weight shifing & weight bearing through RLE for bean bag toss activity.  Pt stood ~15 minutes before needing seated rest break.  Therapy/Group: Co-Treatment Lanette Ell 01/27/2015, 4:12 PM

## 2015-01-27 NOTE — Patient Care Conference (Signed)
Inpatient RehabilitationTeam Conference and Plan of Care Update Date: 01/27/2015   Time: 11;30 AM    Patient Name: Diane Hutchinson      Medical Record Number: [id number]  Date of Birth: 09/29/1946 Sex: Female         Room/Bed: 4W07C/4W07C-01 Payor Info: Payor: MEDICARE / Plan: MEDICARE PART A AND B / Product Type: *No Product type* /    Admitting Diagnosis: RT CVA  Admit Date/Time:  01/22/2015  4:03 PM Admission Comments: No comment available   Primary Diagnosis:  <principal problem not specified> Principal Problem: <principal problem not specified>  Patient Active Problem List   Diagnosis Date Noted  . Embolic cerebral infarction 01/22/2015  . Left hemiparesis 01/22/2015  . Stroke   . Cerebral embolism with cerebral infarction 01/21/2015  . Ataxia   . Brain aneurysm 01/20/2015  . TOBACCO ABUSE 01/04/2010  . BRONCHITIS, CHRONIC 01/04/2010  . CERVICAL CANCER 01/03/2010  . HYPERLIPIDEMIA 01/03/2010  . DEPRESSION 01/03/2010  . SUBARACHNOID HEMORRHAGE 01/03/2010  . Diane Hutchinson 01/03/2010    Expected Discharge Date: Expected Discharge Date: 02/19/15  Team Members Present: Physician leading conference: Dr. Alysia Penna Social Worker Present: Ovidio Kin, LCSW Nurse Present: Heather Roberts, RN PT Present: Cameron Sprang, PT OT Present: Benay Pillow, OT PPS Coordinator present : Daiva Nakayama, RN, CRRN     Current Status/Progress Goal Weekly Team Focus  Medical   Severe sensory loss on left side, poor awareness of joint position on left side, chronic anxiety and depression  Home discharge with medical stability  Increase awareness of left side   Bowel/Bladder   Continent to bowel and bladder.  To continue continent to bladder and bowel.  To monitor bladder and bowel function Q shift.   Swallow/Nutrition/ Hydration     NA        ADL's   bathing-mod A; UB dressing-min A; LB dressing-max A; functional transfers-max A; toileting-max A  supervision overall; LB dressing-min A   activity tolerance, functional transfers, standing balance, LUE NMR, BADLs   Mobility   Pt currently max A for bed mobility, max A for functional transfers, max A for standing with +2 needed for more dynamic tasks, +2 A for gait.  Pt extremely limited by decreased sensation and proprioception in LUE/LE.   S overall  LUE/LE NMR, transfers, standing balance, gait, pt/family education   Communication             Safety/Cognition/ Behavioral Observations     To keep pt. free from falls during her stay in rehab.  To keep hour rounding to assure pt. safety.   Pain   No complain of pain.  To keep pain levels less than 3,on scale 1 to 10.  To monitor pain levels Q 2 hrs. and PRN.   Skin   Pt. has several generalized bruises.  To keep skin free of pressure ulcers.  To monitor skin Q. shift and PRN      *See Care Plan and progress notes for long and short-term goals.  Barriers to Discharge: See above    Possible Resolutions to Barriers:  See above    Discharge Planning/Teaching Needs:  Home with husband who will take a FMLA to provide care to pt at discharge      Team Discussion:  Ankle instability order for air cast for support in therapies.  Self awareness poor.  Currently plus 2 assist-goals for supervision/min level. Husband to take FMLA to provide care to pt at home.  Revisions to Treatment Plan:  None   Continued Need for Acute Rehabilitation Level of Care: The patient requires daily medical management by a physician with specialized training in physical medicine and rehabilitation for the following conditions: Daily direction of a multidisciplinary physical rehabilitation program to ensure safe treatment while eliciting the highest outcome that is of practical value to the patient.: Yes Daily medical management of patient stability for increased activity during participation in an intensive rehabilitation regime.: Yes Daily analysis of laboratory values and/or radiology reports  with any subsequent need for medication adjustment of medical intervention for : Neurological problems;Other  Diane Hutchinson 01/29/2015, 9:54 AM

## 2015-01-27 NOTE — Progress Notes (Signed)
Social Work Patient ID: Diane Hutchinson, female   DOB: 04-May-1946, 69 y.o.   MRN: [id number] Met with pt, sister and husband to inform of team conference goals-supervision/min level and target discharge date 4/8.  Pt feels this is too long and would like it to be sooner. Discussed will meet each week and discuss her progress and may be able to move up with husband attending therapies and learning her care.  Husband wants to make sure He is able to provide the care she needs before she goes home.  He states: " If we both go down there is no one to take care of you."  Pt will try to be optimistic and take each day at a time. Will see how pt progresses and see if can move up discharge date.

## 2015-01-27 NOTE — Progress Notes (Addendum)
Occupational Therapy Session Note  Patient Details  Name: Diane Hutchinson MRN: [id number] Date of Birth: 03-10-1946  Today's Date: 01/27/2015 OT Individual Time: 0800-0900 OT Individual Time Calculation (min): 60 min    Short Term Goals: Week 1:  OT Short Term Goal 1 (Week 1): Pt will complete squat-pivot transfer to drop arm BSC with min assist OT Short Term Goal 2 (Week 1): Pt will complete upper body dressing with supervision OT Short Term Goal 3 (Week 1): Pt will complete lower body dressing with mod assist OT Short Term Goal 4 (Week 1): Pt will complete 1 of 5 grooming tasks standing supported at sink with min guard assist OT Short Term Goal 5 (Week 1): Pt will demo ability to wash left lower leg with left hand using wash mit with supervision Skilled Therapeutic Interventions/Progress Updates:  Upon entering the room, pt supine in bed with no c/o pain this session. Pt reporting she had dressed and taken a bath prior to therapist arrival. Pt reporting she was unsure of who was assisting her and did not want a female therapist to assist with self care tasks. Pt performing supine >sit with Mod A to EOB. Pt sat EOB with B feet supported and close supervision while reaching for coffee. While seated EOB, therapist discussing OT goals and progress with pt. Pt verbalizing understanding and reports feeling anxious about team meeting today. Mod A squat pivot transfer bed > wheelchair. Pt seated in wheelchair at sink to perform grooming tasks with assistance to open containers. OT assisted pt into  Bathroom via wheelchair and required min verbal cues to lock breaks and for proper technique. Max A squat pivot transfer wheelchair <> bed for toileting. Pt requiring assistance with clothing management after voiding. Pt remained seated in wheelchair with call bell within reach upon exiting the room.   Therapy Documentation Precautions:  Precautions Precautions: Fall Precaution Comments: L  inattention Restrictions Weight Bearing Restrictions: No  Pain: Pain Assessment Pain Assessment: No/denies pain Pain Score: 0-No pain ADL: ADL ADL Comments: see FIM  See FIM for current functional status  Therapy/Group: Individual Therapy  Phineas Semen 01/27/2015, 11:05 AM

## 2015-01-27 NOTE — Progress Notes (Signed)
Physical Therapy Session Note  Patient Details  Name: Diane Hutchinson MRN: [id number] Date of Birth: [birth year]  Today's Date: 01/27/2015 PT Individual Time: 1400-1520 PT Individual Time Calculation (min): 80 min   Short Term Goals: Week 1:  PT Short Term Goal 1 (Week 1): Pt will increase transfers supine to edge of bed, edge of bed to supine to min A.  PT Short Term Goal 2 (Week 1): Pt will increase transfers bed to w/c, w/c to bed to mod A.  PT Short Term Goal 3 (Week 1): Pt will increase ambulation with LRAD for 25 feet with mod A.  PT Short Term Goal 4 (Week 1): Pt will propel w/c about 75 feet with S and verbal cues.  PT Short Term Goal 5 (Week 1): Pt will ascend/descend 2 stairs with 1 rail and max A.   Skilled Therapeutic Interventions/Progress Updates:   Pt received sitting in w/c in room, agreeable to therapy session.  Assisted pt to/from therapy session via w/c.  Skilled session continues to focus on dynamic standing via use of stedy for WB through LLE, weight shifting to the R to decrease pusher tendencies, functional use of LUE, and midline orientation.  Donned L air cast to prevent L ankle inversion during standing as well as ace wrap to LUE for compression and proprioceptive feedback.  Performed retrieval of horse shoes to the L with LUE, passing to the RUE and reaching far to the R to hang on basketball hoop then vice versa.  Transitioned to performing sit<>stand with and without UE support with use of mirror for increased visual feedback for posture.  Requires up to mod A during task with facilitation for posture, L knee extension and glute activation.  RT in session for short time, therefore performed two bouts of gait x 65' x 2 via "three muskateer style" with L air cast and L knee cage to prevent hyperextension.  Pt did much better with placement of LLE, however does require intermittent assist for placement due to decreased proprioception.  Husband and friend present and  discussed that her shoes may not be most appropriate at this time due to "rocker bottom" type of sole.  Recommended cheap tennis shoes from [employer] with velcro straps 1/2 size bigger to allow room for brace.  Husband to buy tonight and bring in tomorrow.  Assisted onto nustep x 3 mins with BLEs only at level 4 resistance for increased proprioceptive feedback.  Pt then states she needs to use restroom, therefore assisted back to w/c and to room. Performed stand pivot at max A level to bedside commode over toilet.  Pt successful in voiding and performing peri care, however needs max A for adjusting clothing.  Pt assisted back to bed via squat pivot transfer at min A level.  Left in bed with all needs in reach and bed alarm set.  Many family members in room, therefore pt missed 10 mins due to fatigue and wanting to visit.   Therapy Documentation Precautions:  Precautions Precautions: Fall Precaution Comments: L inattention Restrictions Weight Bearing Restrictions: No General: PT Amount of Missed Time (min): 10 Minutes PT Missed Treatment Reason: Patient fatigue (wanting to visit with family) Vital Signs: Therapy Vitals Temp: 98.1 F (36.7 C) Temp Source: Oral Pulse Rate: 69 Resp: 18 BP: 128/72 mmHg Patient Position (if appropriate): Sitting Oxygen Therapy SpO2: 96 % O2 Device: Not Delivered Pain: Pt with mild c/o pain in R knee with use of stedy (sit<>stands), allowed rest breaks as  needed.  Locomotion : Ambulation Ambulation/Gait Assistance: 1: +2 Total assist   See FIM for current functional status  Therapy/Group: Individual Therapy  [name] 01/27/2015, 4:43 PM

## 2015-01-27 NOTE — Progress Notes (Signed)
Orthopedic Tech Progress Note Patient Details:  Diane Hutchinson December 28, 1945 [id number]  Ortho Devices Type of Ortho Device: Ankle Air splint Ortho Device/Splint Location: lle Ortho Device/Splint Interventions: Application   Kristine Chahal 01/27/2015, 11:54 AM

## 2015-01-27 NOTE — Progress Notes (Signed)
69 y.o. right handed female with history of hypertension, migraine headaches as well as multiple intracranial aneurysms with right MCA aneurysmal clipping 1993 as well as coiling 2010 without residual weakness.. Independent/driving prior to admission living with her husband. Presented 01/20/2015 with decreased balance as well as headache. She had been seen by interventional radiology in the past with plan for image guided cerebral arteriogram for findings a left MCA aneurysm. Underwent left MCA elective embolization using stent assistance 01/20/2015 per Intervention radiology. Patient was extubated noted left-sided tingling and weakness. Code stroke was called. CT of the head showed no acute stroke. CTA was then performed again showing no large vessel occlusion. She was not a TPA candidate. Neurology consulted suspect non-dominant right brain infarct, embolic secondary to complication of interventional neuroradiology aneurysm embolization. Echocardiogram is pending. She currently is maintained on aspirin and Plavix therapy  Subjective/Complaints: Didn't get sleep med on time OT this am Review of Systems - Negative except as above  Objective: Vital Signs: Blood pressure 148/86, pulse 70, temperature 98.3 F (36.8 C), temperature source Oral, resp. rate 17, SpO2 98 %. No results found. Results for orders placed or performed during the hospital encounter of 01/22/15 (from the past 72 hour(s))  CBC WITH DIFFERENTIAL     Status: None   Collection Time: 01/25/15  6:20 AM  Result Value Ref Range   WBC 7.9 4.0 - 10.5 K/uL   RBC 4.14 3.87 - 5.11 MIL/uL   Hemoglobin 13.5 12.0 - 15.0 g/dL   HCT 39.0 36.0 - 46.0 %   MCV 94.2 78.0 - 100.0 fL   MCH 32.6 26.0 - 34.0 pg   MCHC 34.6 30.0 - 36.0 g/dL   RDW 12.7 11.5 - 15.5 %   Platelets 218 150 - 400 K/uL   Neutrophils Relative % 57 43 - 77 %   Neutro Abs 4.5 1.7 - 7.7 K/uL   Lymphocytes Relative 31 12 - 46 %   Lymphs Abs 2.4 0.7 - 4.0 K/uL   Monocytes  Relative 9 3 - 12 %   Monocytes Absolute 0.7 0.1 - 1.0 K/uL   Eosinophils Relative 3 0 - 5 %   Eosinophils Absolute 0.3 0.0 - 0.7 K/uL   Basophils Relative 0 0 - 1 %   Basophils Absolute 0.0 0.0 - 0.1 K/uL  Comprehensive metabolic panel     Status: Abnormal   Collection Time: 01/25/15  6:20 AM  Result Value Ref Range   Sodium 143 135 - 145 mmol/L   Potassium 3.1 (L) 3.5 - 5.1 mmol/L   Chloride 108 96 - 112 mmol/L   CO2 27 19 - 32 mmol/L   Glucose, Bld 136 (H) 70 - 99 mg/dL   BUN 8 6 - 23 mg/dL   Creatinine, Ser 0.66 0.50 - 1.10 mg/dL   Calcium 9.0 8.4 - 10.5 mg/dL   Total Protein 6.2 6.0 - 8.3 g/dL   Albumin 3.3 (L) 3.5 - 5.2 g/dL   AST 19 0 - 37 U/L   ALT 18 0 - 35 U/L   Alkaline Phosphatase 54 39 - 117 U/L   Total Bilirubin 0.7 0.3 - 1.2 mg/dL   GFR calc non Af Amer 89 (L) >90 mL/min   GFR calc Af Amer >90 >90 mL/min    Comment: (NOTE) The eGFR has been calculated using the CKD EPI equation. This calculation has not been validated in all clinical situations. eGFR's persistently <90 mL/min signify possible Chronic Kidney Disease.    Anion gap 8 5 -  15     HEENT: normal Cardio: RRR and no murmur Resp: CTA B/L and unlabored GI: BS positive and NT, ND Extremity:  Pulses positive and No Edema Skin:   Bruise multiple bruises LUE>LLE Neuro: Alert/Oriented, Abnormal Sensory Absent LT and proprio LUE.LLE, Abnormal Motor 3/5 LUE, 4/5 LLE with poor motor control but without hyperreflexia and Abnormal FMC Ataxic/ dec FMC Musc/Skel:  Swelling Dorsum Left hand without pain during ROM Gen NAD   Assessment/Plan: 1. Functional deficits secondary to Right MCA infarct with left hemiparesis which require 3+ hours per day of interdisciplinary therapy in a comprehensive inpatient rehab setting. Physiatrist is providing close team supervision and 24 hour management of active medical problems listed below. Physiatrist and rehab team continue to assess barriers to discharge/monitor patient  progress toward functional and medical goals. FIM: FIM - Bathing Bathing Steps Patient Completed: Chest, Right Arm, Left Arm Bathing: 2: Max-Patient completes 3-4 52f10 parts or 25-49% (pt declined LB bathing this morning)  FIM - Upper Body Dressing/Undressing Upper body dressing/undressing steps patient completed: Thread/unthread right sleeve of pullover shirt/dresss, Put head through opening of pull over shirt/dress, Pull shirt over trunk Upper body dressing/undressing: 0: Activity did not occur (pt declined changing house dress this morning) FIM - Lower Body Dressing/Undressing Lower body dressing/undressing steps patient completed: Thread/unthread right pants leg, Thread/unthread left pants leg Lower body dressing/undressing: 0: Activity did not occur  FIM - Toileting Toileting steps completed by patient: Performs perineal hygiene Toileting Assistive Devices: Grab bar or rail for support Toileting: 2: Max-Patient completed 1 of 3 steps  FIM - TRadio producerDevices: BRecruitment consultantTransfers: 2-To toilet/BSC: Max A (lift and lower assist), 2-From toilet/BSC: Max A (lift and lower assist)  FIM - Bed/Chair Transfer Bed/Chair Transfer Assistive Devices: Arm rests (Ace bandage for L ankle stability) Bed/Chair Transfer: 0: Activity did not occur  FIM - Locomotion: Wheelchair Distance: 60 Locomotion: Wheelchair: 2: Travels 50 - 149 ft with moderate assistance (Pt: 50 - 74%) FIM - Locomotion: Ambulation Locomotion: Ambulation Assistive Devices: Other (comment) ("three muskateer style") Ambulation/Gait Assistance: 1: +2 Total assist Locomotion: Ambulation: 1: Two helpers  Comprehension Comprehension Mode: Auditory Comprehension: 6-Follows complex conversation/direction: With extra time/assistive device  Expression Expression Mode: Verbal Expression Assistive Devices: 6-Talk trach valve Expression: 6-Expresses complex ideas: With extra  time/assistive device  Social Interaction Social Interaction: 6-Interacts appropriately with others with medication or extra time (anti-anxiety, antidepressant).  Problem Solving Problem Solving: 6-Solves complex problems: With extra time  Memory Memory: 6-More than reasonable amt of time  Medical Problem List and Plan: 1. 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  DVT Prophylaxis/Anticoagulation: SQ Lovenox.monitor for any bleeding episodes 3. Pain Management: tylenol as needed 4. Mood/depression/anxiety: Risperdal 0.5 mg QHS/Desyrel 150 mg QHS,Zoloft 100 mg daily,Xanax 1 mg TID as needed. Team to provide ego-support as well. 5. Neuropsych: This patient is capable of making decisions on her own behalf. 6. Skin/Wound Care: Routine skin checks 7. Fluids/Electrolytes/Nutrition: Strict I & O.Follow up labs 8.Hyperlipidemia.Lipitor/Lovaza 9.  Hypokalemia, supplement LOS (Days) 5 A FACE TO FACE EVALUATION WAS PERFORMED  , E 01/27/2015, 9:08 AM

## 2015-01-28 ENCOUNTER — Inpatient Hospital Stay (HOSPITAL_COMMUNITY): Payer: Medicare Other | Admitting: Occupational Therapy

## 2015-01-28 ENCOUNTER — Inpatient Hospital Stay (HOSPITAL_COMMUNITY): Payer: Medicare Other | Admitting: Rehabilitation

## 2015-01-28 ENCOUNTER — Inpatient Hospital Stay (HOSPITAL_COMMUNITY): Payer: Medicare Other

## 2015-01-28 NOTE — Progress Notes (Signed)
69 y.o. right handed female with history of hypertension, migraine headaches as well as multiple intracranial aneurysms with right MCA aneurysmal clipping 1993 as well as coiling 2010 without residual weakness.. Independent/driving prior to admission living with her husband. Presented 01/20/2015 with decreased balance as well as headache. She had been seen by interventional radiology in the past with plan for image guided cerebral arteriogram for findings a left MCA aneurysm. Underwent left MCA elective embolization using stent assistance 01/20/2015 per Intervention radiology. Patient was extubated noted left-sided tingling and weakness. Code stroke was called. CT of the head showed no acute stroke. CTA was then performed again showing no large vessel occlusion. She was not a TPA candidate. Neurology consulted suspect non-dominant right brain infarct, embolic secondary to complication of interventional neuroradiology aneurysm embolization. Echocardiogram is pending. She currently is maintained on aspirin and Plavix therapy  Subjective/Complaints: No issues overnite,slept well Review of Systems - Negative except as above  Objective: Vital Signs: Blood pressure 147/65, pulse 74, temperature 98.3 F (36.8 C), temperature source Oral, resp. rate 17, weight 78.9 kg (173 lb 15.1 oz), SpO2 95 %. No results found. Results for orders placed or performed during the hospital encounter of 01/22/15 (from the past 72 hour(s))  Basic metabolic panel     Status: Abnormal   Collection Time: 01/27/15 10:50 AM  Result Value Ref Range   Sodium 142 135 - 145 mmol/L   Potassium 3.8 3.5 - 5.1 mmol/L   Chloride 107 96 - 112 mmol/L   CO2 28 19 - 32 mmol/L   Glucose, Bld 125 (H) 70 - 99 mg/dL   BUN 10 6 - 23 mg/dL   Creatinine, Ser 0.69 0.50 - 1.10 mg/dL   Calcium 9.2 8.4 - 10.5 mg/dL   GFR calc non Af Amer 87 (L) >90 mL/min   GFR calc Af Amer >90 >90 mL/min    Comment: (NOTE) The eGFR has been calculated using the  CKD EPI equation. This calculation has not been validated in all clinical situations. eGFR's persistently <90 mL/min signify possible Chronic Kidney Disease.    Anion gap 7 5 - 15     HEENT: normal Cardio: RRR and no murmur Resp: CTA B/L and unlabored GI: BS positive and NT, ND Extremity:  Pulses positive and No Edema Skin:   Bruise multiple bruises LUE>LLE Neuro: Alert/Oriented, Abnormal Sensory Absent LT and proprio LUE.LLE, Abnormal Motor 3/5 LUE, 4/5 LLE with poor motor control but without hyperreflexia and Abnormal FMC Ataxic/ dec FMC Musc/Skel:  Swelling Dorsum Left hand without pain during ROM Gen NAD   Assessment/Plan: 1. Functional deficits secondary to Right MCA infarct with left hemiparesis which require 3+ hours per day of interdisciplinary therapy in a comprehensive inpatient rehab setting. Physiatrist is providing close team supervision and 24 hour management of active medical problems listed below. Physiatrist and rehab team continue to assess barriers to discharge/monitor patient progress toward functional and medical goals. FIM: FIM - Bathing Bathing Steps Patient Completed: Chest, Right Arm, Left Arm Bathing: 2: Max-Patient completes 3-4 40f10 parts or 25-49% (pt declined LB bathing this morning)  FIM - Upper Body Dressing/Undressing Upper body dressing/undressing steps patient completed: Thread/unthread right sleeve of pullover shirt/dresss, Put head through opening of pull over shirt/dress, Pull shirt over trunk Upper body dressing/undressing: 0: Activity did not occur (pt declined changing house dress this morning) FIM - Lower Body Dressing/Undressing Lower body dressing/undressing steps patient completed: Thread/unthread right pants leg, Thread/unthread left pants leg Lower body dressing/undressing: 0: Activity  did not occur  FIM - Toileting Toileting steps completed by patient: Performs perineal hygiene Toileting Assistive Devices: Grab bar or rail for  support Toileting: 2: Max-Patient completed 1 of 3 steps  FIM - Radio producer Devices: Elevated toilet seat, Grab bars, Bedside commode Toilet Transfers: 2-To toilet/BSC: Max A (lift and lower assist), 2-From toilet/BSC: Max A (lift and lower assist)  FIM - Control and instrumentation engineer Devices: Arm rests Bed/Chair Transfer: 5: Sit > Supine: Supervision (verbal cues/safety issues), 4: Chair or W/C > Bed: Min A (steadying Pt. > 75%)  FIM - Locomotion: Wheelchair Distance: 60 Locomotion: Wheelchair: 0: Activity did not occur FIM - Locomotion: Ambulation Locomotion: Ambulation Assistive Devices: Other (comment), Orthosis ("three muskateer style", L air cast) Ambulation/Gait Assistance: 1: +2 Total assist Locomotion: Ambulation: 1: Two helpers  Comprehension Comprehension Mode: Auditory Comprehension: 6-Follows complex conversation/direction: With extra time/assistive device  Expression Expression Mode: Verbal Expression Assistive Devices: 6-Talk trach valve Expression: 6-Expresses complex ideas: With extra time/assistive device  Social Interaction Social Interaction: 6-Interacts appropriately with others with medication or extra time (anti-anxiety, antidepressant).  Problem Solving Problem Solving: 6-Solves complex problems: With extra time  Memory Memory: 6-More than reasonable amt of time  Medical Problem List and Plan: 1. 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  DVT Prophylaxis/Anticoagulation: SQ Lovenox.monitor for any bleeding episodes 3. Pain Management: tylenol as needed 4. Mood/depression/anxiety: Risperdal 0.5 mg QHS/Desyrel 150 mg QHS,Zoloft 100 mg daily,Xanax 1 mg TID as needed. Team to provide ego-support as well. 5. Neuropsych: This patient is capable of making decisions on her own behalf. 6. Skin/Wound Care: Routine skin checks 7.  Fluids/Electrolytes/Nutrition: Strict I & O.Follow up labs 8.Hyperlipidemia.Lipitor/Lovaza 9.  Hypokalemia, supplement LOS (Days) 6 A FACE TO FACE EVALUATION WAS PERFORMED  KIRSTEINS,ANDREW E 01/28/2015, 8:39 AM

## 2015-01-28 NOTE — Progress Notes (Signed)
Physical Therapy Session Note  Patient Details  Name: Diane Hutchinson MRN: [id number] Date of Birth: 1946-02-02  Today's Date: 01/28/2015 PT Co-Treatment Time: 1130-1200 ([place] with OT - total time 11:00 - 12:00) PT Co-Treatment Time Calculation (min): 30 min  Short Term Goals: Week 1:  PT Short Term Goal 1 (Week 1): Pt will increase transfers supine to edge of bed, edge of bed to supine to min A.  PT Short Term Goal 2 (Week 1): Pt will increase transfers bed to w/c, w/c to bed to mod A.  PT Short Term Goal 3 (Week 1): Pt will increase ambulation with LRAD for 25 feet with mod A.  PT Short Term Goal 4 (Week 1): Pt will propel w/c about 75 feet with S and verbal cues.  PT Short Term Goal 5 (Week 1): Pt will ascend/descend 2 stairs with 1 rail and max A.   Skilled Therapeutic Interventions/Progress Updates:  Treatment session was co-treatment with OT focusing on weight bearing through left LE and UE with functional activity. Patient sitting in wheelchair upon entering room and willing to participate. Patient transferred wheelchair to mat with OT mod assist (assist lifting). Patient into tall kneeling on mat with +2 assist. Patient required verbal and manual facilitation for hip and trunk extension and weight shift to left during reaching activity. Patient began to complain of pain in left knee in kneeling. Patient ambulated with +2 assist - HH on left and assist with left LE facilitation on left using air cast and knee cage for knee control on left. Patient able to advance left LE each step. Patient needed adjustment of placement about 25% of the time. PT assisted with trunk and hip extension and weight shifting to left. Patient needing verbal cueing to increase stance time on left and stride length on right. Patient reported feeling "smothered" with therapists on each side. Patient ambulated using railing in hallway on right with +1 Mod assist and +1 to follow with wheelchair. Same  facilitation needed as above. Patient worked on static standing with equal bilateral weight bearing at [employer] while reaching into cabinets to retrieve items 5 minutes x 2. Patient propelled wheelchair using right extremities 130 feet with mod verbal and manual cueing. Patient left in room with all items in reach.   Therapy Documentation Precautions:  Precautions Precautions: Fall Precaution Comments: L inattention Restrictions Weight Bearing Restrictions: No  Pain: Pain Assessment Pain Assessment: No/denies pain Pain Score: 3   Locomotion : Ambulation Ambulation/Gait Assistance: 1: +2 Total assist Wheelchair Mobility Distance: 130  See FIM for current functional status  Therapy/Group: Co-Treatment  Elder Love M 01/28/2015, 12:08 PM

## 2015-01-28 NOTE — Progress Notes (Signed)
Physical Therapy Session Note  Patient Details  Name: Diane Hutchinson MRN: [id number] Date of Birth: May 29, 1946  Today's Date: 01/28/2015 PT Individual Time: 1400-1510 PT Individual Time Calculation (min): 70 min   Short Term Goals: Week 1:  PT Short Term Goal 1 (Week 1): Pt will increase transfers supine to edge of bed, edge of bed to supine to min A.  PT Short Term Goal 2 (Week 1): Pt will increase transfers bed to w/c, w/c to bed to mod A.  PT Short Term Goal 3 (Week 1): Pt will increase ambulation with LRAD for 25 feet with mod A.  PT Short Term Goal 4 (Week 1): Pt will propel w/c about 75 feet with S and verbal cues.  PT Short Term Goal 5 (Week 1): Pt will ascend/descend 2 stairs with 1 rail and max A.   Skilled Therapeutic Interventions/Progress Updates:   Pt received sitting in w/c in room, agreeable to therapy session.  Worked on w/c propulsion with BLEs for coordination and proprioception during task.  Performed x 40' at mod A level with max verbal cues to attend to LLE during task.  Skilled session focused on dynamic standing tasks for NMR through LLE, weight shift to the R, midline orientation, stepping/retro stepping RLE and LLE for weight shift and improved placement, sit<>stand with small block under RLE for increased weight shift to the L.  Donned L swedish knee cage throughout all standing tasks, and replaced air cast (due to continued inversion) with L allard AFO for DF assist as well as increased support at lateral ankle support.  Pt requires min to mod/max A for tasks, depending on type of facilitation needed.  During reaching to the L task, requires assist for forward translation of hip over LLE with assist at all times for L knee extension.  Pt very anxious throughout session with continued cues for pursed lip breathing.  Ended session with gait with +2 A with R HHA and PT under L axilla in order to facilitate upright posture, forward movement over LLE, L knee extension  and intermittent L foot placement.  Pt assisted back to w/c.  Assisted in getting new L lap tray and order for grounds pass with family.  PA wrote order and educated on how to sign pt out and in.    Therapy Documentation Precautions:  Precautions Precautions: Fall Precaution Comments: L inattention Restrictions Weight Bearing Restrictions: No  Pain: Pain Assessment Pain Assessment: No/denies pain   Locomotion : Ambulation Ambulation/Gait Assistance: 1: +2 Total assist Wheelchair Mobility Distance: 130   See FIM for current functional status  Therapy/Group: Individual Therapy  [name] 01/28/2015, 1:12 PM

## 2015-01-28 NOTE — Progress Notes (Signed)
Occupational Therapy Session Note  Patient Details  Name: Diane Hutchinson MRN: [id number] Date of Birth: 12-Dec-1945  Today's Date: 01/28/2015 OT Co-Treatment Time: 1100-1130 ( co-treatment with PT from 1100-1200) OT Co-Treatment Time Calculation (min): 30 min   Short Term Goals: Week 1:  OT Short Term Goal 1 (Week 1): Pt will complete squat-pivot transfer to drop arm BSC with min assist OT Short Term Goal 2 (Week 1): Pt will complete upper body dressing with supervision OT Short Term Goal 3 (Week 1): Pt will complete lower body dressing with mod assist OT Short Term Goal 4 (Week 1): Pt will complete 1 of 5 grooming tasks standing supported at sink with min guard assist OT Short Term Goal 5 (Week 1): Pt will demo ability to wash left lower leg with left hand using wash mit with supervision  Skilled Therapeutic Interventions/Progress Updates:  Pt received seated in wheelchair with family present in the room. Skilled co-treatment with PT with focus on weight shifts, weight bearing through L UE/LE, and functional mobility. OT assisted pt to therapy gym via wheelchair and total A for energy conservation. Pt transferring squat pivot wheelchair >mat with Mod A. Pt tall kneeling on mat with +2 assist. Patient required verbal and manual facilitation for hip and trunk extension and weight shift to left during reaching activity. Patient began to complain of pain in left knee in kneeling and requiring assistance to return to seated position on mat.Patient ambulated with +2 assist - HH on left and assist with left LE facilitation on left using air cast and knee cage for knee control on left. Patient able to advance left LE each step. PT assisted with trunk and hip extension and weight shifting to left. Patient needing verbal cueing to increase stance time on left and stride length on right and to look forward while ambulating. Patient reported feeling "smothered" with therapists on each side. Patient  ambulated using railing in hallway on right with +1 Mod assist and second helper to follow with wheelchair. Same facilitation needed as above. Pt reporting anxiety with ambulation when therapists are on each side of her and close. She verbalized understanding that she requires assistance for these tasks. Pt engaging in static standing task of 2 bouts of standing of 5 minutes each time. Pt weight bearing with L UE on counter top when reaching into cabinets to retreive items as well as weight shifting to the L LE during task. Pt propelled wheelchair with hemiplegic technique with mod verbal and manual cues. Pt remained in wheelchair with call bell and all needed items within reach.    Therapy Documentation Precautions:  Precautions Precautions: Fall Precaution Comments: L inattention Restrictions Weight Bearing Restrictions: No   Pain: Pain Assessment Pain Assessment: No/denies pain ADL: ADL ADL Comments: see FIM  See FIM for current functional status  Therapy/Group: Co-Treatment  Pittman, Shamanda Len L 01/28/2015, 1:54 PM

## 2015-01-28 NOTE — Progress Notes (Signed)
Occupational Therapy Session Note  Patient Details  Name: Diane Hutchinson MRN: [id number] Date of Birth: 07-19-1946  Today's Date: 01/28/2015 OT Individual Time: 0900-1000 OT Individual Time Calculation (min): 60 min    Short Term Goals: Week 1:  OT Short Term Goal 1 (Week 1): Pt will complete squat-pivot transfer to drop arm BSC with min assist OT Short Term Goal 2 (Week 1): Pt will complete upper body dressing with supervision OT Short Term Goal 3 (Week 1): Pt will complete lower body dressing with mod assist OT Short Term Goal 4 (Week 1): Pt will complete 1 of 5 grooming tasks standing supported at sink with min guard assist OT Short Term Goal 5 (Week 1): Pt will demo ability to wash left lower leg with left hand using wash mit with supervision  Skilled Therapeutic Interventions/Progress Updates:  Upon entering the room, pt supine in bed with no c/o pain during session. Supine >sit with Min A this session . Mod A squat pivot transfer from bed to wheelchair. OT assisted pt into bathroom for toileting with Max A for clothing management and stand pivot transfer with use of grab bar. Mod A squat pivot transfer from wheelchair >shower seat with use of grab bar. Pt bathing at shower level with bath mit donned on L UE and encouraged pt to wash as many "parts" as able during task. Grooming seated in wheelchair at sink side with set up A. Dressing performed at sink as well with Mod A for STS and assist with LB clothing management. Pt seated in wheelchair with call bell and all needed items within reach upon exiting the room.   Therapy Documentation Precautions:  Precautions Precautions: Fall Precaution Comments: L inattention Restrictions Weight Bearing Restrictions: No Pain: Pain Assessment Pain Assessment: No/denies pain Pain Score: 3  ADL: ADL ADL Comments: see FIM  See FIM for current functional status  Therapy/Group: Individual Therapy  Phineas Semen 01/28/2015, 12:46  PM

## 2015-01-29 ENCOUNTER — Inpatient Hospital Stay (HOSPITAL_COMMUNITY): Payer: Medicare Other | Admitting: Rehabilitation

## 2015-01-29 ENCOUNTER — Inpatient Hospital Stay (HOSPITAL_COMMUNITY): Payer: Medicare Other | Admitting: Occupational Therapy

## 2015-01-29 DIAGNOSIS — R209 Unspecified disturbances of skin sensation: Secondary | ICD-10-CM

## 2015-01-29 DIAGNOSIS — I69398 Other sequelae of cerebral infarction: Secondary | ICD-10-CM | POA: Insufficient documentation

## 2015-01-29 LAB — CREATININE, SERUM
Creatinine, Ser: 0.8 mg/dL (ref 0.50–1.10)
GFR calc non Af Amer: 74 mL/min — ABNORMAL LOW (ref >90)
GFR, EST AFRICAN AMERICAN: 86 mL/min — AB (ref >90)

## 2015-01-29 NOTE — Progress Notes (Signed)
Referring Physician(s): Stroke  Subjective:  L MCA aneurysm embolization 3/9 Post procedure CVA In Rehab Better daily Up in chair on phon pleasant Moving Left better   Allergies: Codeine; Meperidine hcl; Morphine; and Sulfonamide derivatives  Medications: Prior to Admission medications   Medication Sig Start Date End Date Taking? Authorizing Provider  acetaminophen (TYLENOL) 500 MG tablet Take 2 tablets (1,000 mg total) by mouth every 6 (six) hours as needed (increased temp or pain). 01/22/15   Hedy Jacob, PA-C  albuterol (PROVENTIL) (2.5 MG/3ML) 0.083% nebulizer solution Take 2.5 mg by nebulization every 6 (six) hours as needed for wheezing or shortness of breath.    Historical Provider, MD  alendronate (FOSAMAX) 70 MG tablet Take 70 mg by mouth every Monday. Take with a full glass of water on an empty stomach.    Historical Provider, MD  ALPRAZolam Duanne Moron) 1 MG tablet Take 1 mg by mouth 3 (three) times daily as needed for anxiety.    Historical Provider, MD  aspirin 325 MG tablet Take 1 tablet (325 mg total) by mouth daily with breakfast. 01/22/15   Hedy Jacob, PA-C  atorvastatin (LIPITOR) 40 MG tablet Take 40 mg by mouth daily.    Historical Provider, MD  Cholecalciferol (VITAMIN D PO) Take 1 tablet by mouth daily.    Historical Provider, MD  clopidogrel (PLAVIX) 75 MG tablet Take 75 mg by mouth daily.    Historical Provider, MD  gemfibrozil (LOPID) 600 MG tablet Take 600 mg by mouth 2 (two) times daily before a meal.    Historical Provider, MD  ipratropium (ATROVENT) 0.02 % nebulizer solution Take 0.5 mg by nebulization every 6 (six) hours as needed for wheezing or shortness of breath.     Historical Provider, MD  meloxicam (MOBIC) 15 MG tablet Take 15 mg by mouth daily.    Historical Provider, MD  metoprolol succinate (TOPROL XL) 25 MG 24 hr tablet Take 25 mg by mouth at bedtime.     Historical Provider, MD  Omega-3 Fatty Acids (FISH OIL PO) Take 1 capsule by mouth  daily.    Historical Provider, MD  risperiDONE (RISPERDAL) 0.5 MG tablet Take 0.5 mg by mouth at bedtime.    Historical Provider, MD  sertraline (ZOLOFT) 100 MG tablet Take 200 mg by mouth at bedtime.  11/16/14   Historical Provider, MD  traZODone (DESYREL) 150 MG tablet Take 300 mg by mouth at bedtime.    Historical Provider, MD  VITAMIN E PO Take 1 tablet by mouth daily.    Historical Provider, MD     Vital Signs: BP 134/58 mmHg  Pulse 65  Temp(Src) 99.2 F (37.3 C) (Oral)  Resp 18  Wt 78.9 kg (173 lb 15.1 oz)  SpO2 94%  Physical Exam  Constitutional: She is oriented to person, place, and time. She appears well-nourished.  Musculoskeletal:  Moves Rt well Left is slow but some movement coming now Leg coming along well Arm is still slow  Neurological: She is alert and oriented to person, place, and time.  Skin: Skin is warm and dry.  Psychiatric: She has a normal mood and affect. Her behavior is normal. Judgment and thought content normal.  Nursing note and vitals reviewed.   Imaging: No results found.  Labs:  CBC:  Recent Labs  01/21/15 0505 01/21/15 1145 01/22/15 1821 01/25/15 0620  WBC 12.1* 12.3* 8.8 7.9  HGB 11.9* 12.7 13.6 13.5  HCT 34.8* 37.3 39.6 39.0  PLT 187 189 182 218  COAGS:  Recent Labs  01/18/15 1308  INR 0.95  APTT 31    BMP:  Recent Labs  01/18/15 1308 01/21/15 0505  01/22/15 1821 01/25/15 0620 01/27/15 1050 01/29/15 0713  NA 140 141  --   --  143 142  --   K 3.8 3.8  --   --  3.1* 3.8  --   CL 105 108  --   --  108 107  --   CO2 26 29  --   --  27 28  --   GLUCOSE 181* 105*  --   --  136* 125*  --   BUN 12 7  --   --  8 10  --   CALCIUM 8.6 7.9*  --   --  9.0 9.2  --   CREATININE 1.05 0.69  < > 0.78 0.66 0.69 0.80  GFRNONAA 53* 87*  < > 84* 89* 87* 74*  GFRAA 62* >90  < > >90 >90 >90 86*  < > = values in this interval not displayed.  LIVER FUNCTION TESTS:  Recent Labs  01/18/15 1308 01/25/15 0620  BILITOT 0.7 0.7    AST 19 19  ALT 23 18  ALKPHOS 94 54  PROT 6.4 6.2  ALBUMIN 3.9 3.3*    Assessment and Plan:  L MCA aneurysm embo 3/9 Post CVA In Rehab Will report to Dr Estanislado Pandy  Signed: Monia Sabal A 01/29/2015, 11:45 AM   I spent a total of 15 Minutes in face to face in clinical consultation/evaluation, greater than 50% of which was counseling/coordinating care for L MCA aneurysm embo

## 2015-01-29 NOTE — Progress Notes (Signed)
Physical Therapy Weekly Progress Note  Patient Details  Name: Diane Hutchinson MRN: [id number] Date of Birth: Jul 21, 1946  Beginning of progress report period: January 23, 2015 End of progress report period: January 29, 2015  Today's Date: 01/29/2015 PT Individual Time: 1400-1500 PT Individual Time Calculation (min): 60 min   Patient has met 2 of 5 short term goals.  Pt making slow but steady progress with all aspects of mobility.  Continues to be extremely limited by decreased sensation and proprioception in LUE/LE.  Have utilized L air cast and now Allard AFO along with L swedish knee cage for proprioceptive feedback, DF assist and to prevent recurvatum during all standing tasks.  She has made great progress with transfers and sit<>stand, however uses compensations which have been difficult to correct.    Patient continues to demonstrate the following deficits: decreased balance, decreased proprioception, decreased sensation in LUE/LE, decreased attention and therefore will continue to benefit from skilled PT intervention to enhance overall performance with activity tolerance, balance, postural control, ability to compensate for deficits, functional use of  left upper extremity and left lower extremity, attention, coordination and knowledge of precautions.  Patient progressing toward long term goals..  Continue plan of care. (Will continue to track progress and determine if need to downgrade goals to min A next week.)  PT Short Term Goals Week 1:  PT Short Term Goal 1 (Week 1): Pt will increase transfers supine to edge of bed, edge of bed to supine to min A.  PT Short Term Goal 1 - Progress (Week 1): Met PT Short Term Goal 2 (Week 1): Pt will increase transfers bed to w/c, w/c to bed to mod A.  PT Short Term Goal 2 - Progress (Week 1): Met PT Short Term Goal 3 (Week 1): Pt will increase ambulation with LRAD for 25 feet with mod A.  PT Short Term Goal 3 - Progress (Week 1): Progressing  toward goal PT Short Term Goal 4 (Week 1): Pt will propel w/c about 75 feet with S and verbal cues.  PT Short Term Goal 4 - Progress (Week 1): Progressing toward goal PT Short Term Goal 5 (Week 1): Pt will ascend/descend 2 stairs with 1 rail and max A.  PT Short Term Goal 5 - Progress (Week 1): Progressing toward goal Week 2:  PT Short Term Goal 1 (Week 2): Pt will perform bed mobility at S level with HOB flat and without rails with mod cues for technique PT Short Term Goal 2 (Week 2): Pt will perform functional transfers to the R and L with min A.  PT Short Term Goal 3 (Week 2): Pt will self propel w/c x 100' using R hemi technique at S level.  PT Short Term Goal 4 (Week 2): Pt will ambulate x 50' with max A (+2 for chair follow) with LRAD.   Skilled Therapeutic Interventions/Progress Updates:   Pt received lying in bed, agreeable to therapy, but stating that she "just didn't feel good."  States that she is tired and had not gotten enough sleep last night. Skilled session focused on bed mobility and functional transfers to bed and to car as well as w/c propulsion using R hemi technique.  Performed bed mobility in hospital bed and then in ADL apt at min A level with cues for sequencing and technique with log roll with pt returning demonstration very well throughout session.  Performed squat pivot transfers bed<>w/c and again in apt at min A level both ways today.  Marked improvement with transfers to the L with cues for attention to L hand and L foot placement during transfers as well as w/c set up and parts management.  Performed w/c propulsion x 65' throughout session using R hemi technique at mod A level.  Pt unable to coordinate RUE with RLE during task and pt demonstrating poor frustration tolerance during task.  Ended session with car transfer x 2 reps.  First to car simulated height and second time to SUV simulated height.  Performed both with min A with cues for set up and safety and scooting hips  back into car seat before getting legs into car.  Husband present during session for education and to observe.  Pt assisted back to room and transferred to bed.  Left in bed with all needs in reach.   Therapy Documentation Precautions:  Precautions Precautions: Fall Precaution Comments: L inattention Restrictions Weight Bearing Restrictions: No   Vital Signs: Therapy Vitals Temp: 99.2 F (37.3 C) Temp Source: Oral Pulse Rate: 65 Resp: 18 BP: (!) 134/58 mmHg Patient Position (if appropriate): Lying Oxygen Therapy SpO2: 94 % O2 Device: Not Delivered Pain: Pt states "not feeling good" during session but just states fatigue and no pain.    See FIM for current functional status  Therapy/Group: Individual Therapy  [name] 01/29/2015, 8:06 AM

## 2015-01-29 NOTE — Progress Notes (Signed)
69 y.o. right handed female with history of hypertension, migraine headaches as well as multiple intracranial aneurysms with right MCA aneurysmal clipping 1993 as well as coiling 2010 without residual weakness.. Independent/driving prior to admission living with her husband. Presented 01/20/2015 with decreased balance as well as headache. She had been seen by interventional radiology in the past with plan for image guided cerebral arteriogram for findings a left MCA aneurysm. Underwent left MCA elective embolization using stent assistance 01/20/2015 per Intervention radiology. Patient was extubated noted left-sided tingling and weakness. Code stroke was called. CT of the head showed no acute stroke. CTA was then performed again showing no large vessel occlusion. She was not a TPA candidate. Neurology consulted suspect non-dominant right brain infarct, embolic secondary to complication of interventional neuroradiology aneurysm embolization. Echocardiogram is pending. She currently is maintained on aspirin and Plavix therapy  Subjective/Complaints: Complains about RNs not giving her meds on time, mainly sleep and anxiety meds Review of Systems - Negative except as above  Objective: Vital Signs: Blood pressure 151/71, pulse 72, temperature 97.9 F (36.6 C), temperature source Oral, resp. rate 18, weight 78.9 kg (173 lb 15.1 oz), SpO2 98 %. No results found. Results for orders placed or performed during the hospital encounter of 01/22/15 (from the past 72 hour(s))  Basic metabolic panel     Status: Abnormal   Collection Time: 01/27/15 10:50 AM  Result Value Ref Range   Sodium 142 135 - 145 mmol/L   Potassium 3.8 3.5 - 5.1 mmol/L   Chloride 107 96 - 112 mmol/L   CO2 28 19 - 32 mmol/L   Glucose, Bld 125 (H) 70 - 99 mg/dL   BUN 10 6 - 23 mg/dL   Creatinine, Ser 0.69 0.50 - 1.10 mg/dL   Calcium 9.2 8.4 - 10.5 mg/dL   GFR calc non Af Amer 87 (L) >90 mL/min   GFR calc Af Amer >90 >90 mL/min   Comment: (NOTE) The eGFR has been calculated using the CKD EPI equation. This calculation has not been validated in all clinical situations. eGFR's persistently <90 mL/min signify possible Chronic Kidney Disease.    Anion gap 7 5 - 15  Creatinine, serum     Status: Abnormal   Collection Time: 01/29/15  7:13 AM  Result Value Ref Range   Creatinine, Ser 0.80 0.50 - 1.10 mg/dL   GFR calc non Af Amer 74 (L) >90 mL/min   GFR calc Af Amer 86 (L) >90 mL/min    Comment: (NOTE) The eGFR has been calculated using the CKD EPI equation. This calculation has not been validated in all clinical situations. eGFR's persistently <90 mL/min signify possible Chronic Kidney Disease.      HEENT: normal Cardio: RRR and no murmur Resp: CTA B/L and unlabored GI: BS positive and NT, ND Extremity:  Pulses positive and No Edema Skin:   Bruise multiple bruises LUE>LLE Neuro: Alert/Oriented, Abnormal Sensory Absent LT and proprio LUE.LLE, Abnormal Motor 3/5 LUE, 4/5 LLE with poor motor control but without hyperreflexia and Abnormal FMC Ataxic/ dec FMC Musc/Skel:  Swelling Dorsum Left hand without pain during ROM Gen NAD   Assessment/Plan: 1. Functional deficits secondary to Right MCA infarct with left hemiparesis which require 3+ hours per day of interdisciplinary therapy in a comprehensive inpatient rehab setting. Physiatrist is providing close team supervision and 24 hour management of active medical problems listed below. Physiatrist and rehab team continue to assess barriers to discharge/monitor patient progress toward functional and medical goals. FIM: FIM -  Bathing Bathing Steps Patient Completed: Chest, Right Arm, Left Arm, Abdomen, Front perineal area, Right upper leg, Left upper leg, Buttocks Bathing: 4: Min-Patient completes 8-9 [id number] parts or 75+ percent  FIM - Upper Body Dressing/Undressing Upper body dressing/undressing steps patient completed: Thread/unthread right sleeve of front closure  shirt/dress Upper body dressing/undressing: 2: Max-Patient completed 25-49% of tasks FIM - Lower Body Dressing/Undressing Lower body dressing/undressing steps patient completed: Thread/unthread right underwear leg, Thread/unthread left underwear leg, Thread/unthread right pants leg, Thread/unthread left pants leg Lower body dressing/undressing: 2: Max-Patient completed 25-49% of tasks  FIM - Toileting Toileting steps completed by patient: Performs perineal hygiene, Adjust clothing after toileting Toileting Assistive Devices: Grab bar or rail for support Toileting: 3: Mod-Patient completed 2 of 3 steps  FIM - TRadio producerDevices: Elevated toilet seat, Grab bars Toilet Transfers: 3-To toilet/BSC: Mod A (lift or lower assist), 3-From toilet/BSC: Mod A (lift or lower assist)  FIM - BControl and instrumentation engineerDevices: Arm rests Bed/Chair Transfer: 4: Bed > Chair or W/C: Min A (steadying Pt. > 75%), 4: Chair or W/C > Bed: Min A (steadying Pt. > 75%)  FIM - Locomotion: Wheelchair Distance: 65 Locomotion: Wheelchair: 2: Travels 50 - 149 ft with moderate assistance (Pt: 50 - 74%) FIM - Locomotion: Ambulation Locomotion: Ambulation Assistive Devices: WEthelene HalAmbulation/Gait Assistance: 1: +2 Total assist Locomotion: Ambulation: 1: Two helpers  Comprehension Comprehension Mode: Auditory Comprehension: 6-Follows complex conversation/direction: With extra time/assistive device  Expression Expression Mode: Verbal Expression Assistive Devices: 6-Talk trach valve Expression: 6-Expresses complex ideas: With extra time/assistive device  Social Interaction Social Interaction: 6-Interacts appropriately with others with medication or extra time (anti-anxiety, antidepressant).  Problem Solving Problem Solving: 5-Solves basic 90% of the time/requires cueing < 10% of the time  Memory Memory: 6-More than reasonable amt of time  Medical  Problem List and Plan: 1. 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  DVT Prophylaxis/Anticoagulation: SQ Lovenox.monitor for any bleeding episodes 3. Pain Management: tylenol as needed 4. Mood/depression/anxiety: Risperdal 0.5 mg QHS/Desyrel 150 mg QHS,Zoloft 100 mg daily,Xanax 1 mg TID as needed. Team to provide ego-support as well. 5. Neuropsych: This patient is capable of making decisions on her own behalf. 6. Skin/Wound Care: Routine skin checks 7. Fluids/Electrolytes/Nutrition: Strict I & O.Follow up labs 8.Hyperlipidemia.Lipitor/Lovaza 9.  Hypokalemia, supplement LOS (Days) 7 A FACE TO FACE EVALUATION WAS PERFORMED  Yakima Kreitzer E 01/29/2015, 5:04 PM

## 2015-01-29 NOTE — Progress Notes (Signed)
Social Work Diane Hashimoto, LCSW Social Worker Signed  Patient Care Conference 01/27/2015  1:56 PM    Expand All Collapse All   Inpatient RehabilitationTeam Conference and Plan of Care Update Date: 01/27/2015   Time: 11;30 AM     Patient Name: Diane Hutchinson       Medical Record Number: [id number]  Date of Birth: October 31, 1946 Sex: Female         Room/Bed: 4W07C/4W07C-01 Payor Info: Payor: MEDICARE / Plan: MEDICARE PART A AND B / Product Type: *No Product type* /    Admitting Diagnosis: RT CVA   Admit Date/Time:  01/22/2015  4:03 PM Admission Comments: No comment available   Primary Diagnosis:  <principal problem not specified> Principal Problem: <principal problem not specified>    Patient Active Problem List     Diagnosis  Date Noted   .  Embolic cerebral infarction  01/22/2015   .  Left hemiparesis  01/22/2015   .  Stroke     .  Cerebral embolism with cerebral infarction  01/21/2015   .  Ataxia     .  Brain aneurysm  01/20/2015   .  TOBACCO ABUSE  01/04/2010   .  BRONCHITIS, CHRONIC  01/04/2010   .  CERVICAL CANCER  01/03/2010   .  HYPERLIPIDEMIA  01/03/2010   .  DEPRESSION  01/03/2010   .  SUBARACHNOID HEMORRHAGE  01/03/2010   .  Diane Hutchinson  01/03/2010     Expected Discharge Date: Expected Discharge Date: 02/19/15  Team Members Present: Physician leading conference: Dr. Alysia Penna Social Worker Present: Ovidio Kin, LCSW Nurse Present: Heather Roberts, RN PT Present: Cameron Sprang, PT OT Present: Benay Pillow, OT PPS Coordinator present : Daiva Nakayama, RN, CRRN        Current Status/Progress  Goal  Weekly Team Focus   Medical     Severe sensory loss on left side, poor awareness of joint position on left side, chronic anxiety and depression   Home discharge with medical stability   Increase awareness of left side   Bowel/Bladder     Continent to bowel and bladder.  To continue continent to bladder and bowel.   To monitor bladder and bowel function Q shift.     Swallow/Nutrition/ Hydration       NA         ADL's     bathing-mod A; UB dressing-min A; LB dressing-max A; functional transfers-max A; toileting-max A  supervision overall; LB dressing-min A   activity tolerance, functional transfers, standing balance, LUE NMR, BADLs   Mobility     Pt currently max A for bed mobility, max A for functional transfers, max A for standing with +2 needed for more dynamic tasks, +2 A for gait.  Pt extremely limited by decreased sensation and proprioception in LUE/LE.   S overall  LUE/LE NMR, transfers, standing balance, gait, pt/family education   Communication               Safety/Cognition/ Behavioral Observations       To keep pt. free from falls during her stay in rehab.   To keep hour rounding to assure pt. safety.    Pain     No complain of pain.  To keep pain levels less than 3,on scale 1 to 10.  To monitor pain levels Q 2 hrs. and PRN.   Skin     Pt. has several generalized bruises.   To keep skin free of  pressure ulcers.   To monitor skin Q. shift and PRN      *See Care Plan and progress notes for long and short-term goals.    Barriers to Discharge:  See above     Possible Resolutions to Barriers:   See above     Discharge Planning/Teaching Needs:   Home with husband who will take a FMLA to provide care to pt at discharge       Team Discussion:    Ankle instability order for air cast for support in therapies.  Self awareness poor.  Currently plus 2 assist-goals for supervision/min level. Husband to take FMLA to provide care to pt at home.    Revisions to Treatment Plan:    None    Continued Need for Acute Rehabilitation Level of Care: The patient requires daily medical management by a physician with specialized training in physical medicine and rehabilitation for the following conditions: Daily direction of a multidisciplinary physical rehabilitation program to ensure safe treatment while eliciting the highest outcome that is of  practical value to the patient.: Yes Daily medical management of patient stability for increased activity during participation in an intensive rehabilitation regime.: Yes Daily analysis of laboratory values and/or radiology reports with any subsequent need for medication adjustment of medical intervention for : Neurological problems;Other  Diane Hutchinson 01/29/2015, 9:54 AM                  Patient ID: Diane Hutchinson, female   DOB: 07/09/1946, 69 y.o.   MRN: [id number]

## 2015-01-29 NOTE — Progress Notes (Signed)
Occupational Therapy Weekly Progress Note  Patient Details  Name: Diane Hutchinson MRN: [id number] Date of Birth: January 05, 1946  Beginning of progress report period: January 23, 2015 End of progress report period: January 29, 2015  Today's Date: 01/29/2015 OT Individual Time: 0800-0900 and 1000-1100 OT Individual Time Calculation (min): 60 min and 60 min   Patient has met 0 of 5 short term goals. Pt making slow steady progress towards goals this session. Pt has made progress towards functional transfers this week. Pt continued to require max verbal cues for safety awareness with L UE secondary to deficits in sensation. Pt with low frustration tolerance in therapy.   Patient continues to demonstrate the following deficits: decreased i in self care, decreased sensation and proprioception in L UE/LE, decreased safety awareness, decreased AROM and strength in L UE/LE, decreased functional mobility/transfers and therefore will continue to benefit from skilled OT intervention to enhance overall performance with BADL.  Patient progressing toward long term goals..  Continue plan of care. LTGs may be downgraded to overall Min A pending further progress.   OT Short Term Goals Week 1:  OT Short Term Goal 1 (Week 1): Pt will complete squat-pivot transfer to drop arm BSC with min assist OT Short Term Goal 1 - Progress (Week 1): Progressing toward goal OT Short Term Goal 2 (Week 1): Pt will complete upper body dressing with supervision OT Short Term Goal 2 - Progress (Week 1): Not met OT Short Term Goal 3 (Week 1): Pt will complete lower body dressing with mod assist OT Short Term Goal 3 - Progress (Week 1): Not met OT Short Term Goal 4 (Week 1): Pt will complete 1 of 5 grooming tasks standing supported at sink with min guard assist OT Short Term Goal 4 - Progress (Week 1): Not met OT Short Term Goal 5 (Week 1): Pt will demo ability to wash left lower leg with left hand using wash mit with  supervision OT Short Term Goal 5 - Progress (Week 1): Progressing toward goal Week 2:  OT Short Term Goal 1 (Week 2): Pt will perform shower transfer with Mod A squat pivot in order to decrease assistance with functional transfers. OT Short Term Goal 2 (Week 2): Pt will perform toileting with Min A for balance. OT Short Term Goal 3 (Week 2): Pt will independently donn and doff bath mit in shower during bathing tasks. OT Short Term Goal 4 (Week 2): Pt will perform UB dressing with supervision in order to increase I in self care.  Skilled Therapeutic Interventions/Progress Updates:  Session 1: Upon entering the room, pt supine in bed with head slightly elevated. Pt very upset about medications being given late last night and she was unable to sleep. Pt stating, "I am going to be grumpy and pissy all day now." Pt agreeable to begin bathing seated on EOB. After UB bathing, pt transferred Min A squat pivot to wheelchair. Mod A squat pivot onto elevated toilet with use of grab bar. Pt requiring assistance with clothing management. Pt returning to wheelchair in same manner and completed LB dressing at sink. STS at sink with Mod A for balance and heavy lean to the R requiring increase assistance up to Max A. Pt reporting knee is sore and she is fatigued from yesterdays session. Once dressed, pt perform grooming task with set up A to open and apply toothpaste to tooth brush. Pt required Max verbal cues through session for safety awareness of L UE during functional tasks and  transfers.   Session 2: Upon entering the room, pt seated in wheelchair awaiting therapist. Pt with 5/10 pain reported in L UE/LE this session. Pt propelled self ~ 120 feet to day room with Mod verbal cues and manual facilitation as pt continued to veer to the L. Pt with very low frustration tolerance this session and using profanity at times when she is unable to do something as fast as she would like to. Pt standing at table for ~ 5 minutes  for UNO card game with Min - Mod assist as pt leaving to the R during session with increasing fatigue. Pt sitting and required to utilize L UE to bring card near her. She was able to flip card with L hand on 3/19 tries. Pt required to utilize L UE to draw cards each time as needed. Pt performed shoulder flexion for table slides in an effort to have pt attend to L UE. Pt given instructions to place R hand over L on towel. Pt distracted by external stimuli and requiring min verbal cues to attend to task. Pt unaware that L hand no longer under R and "flops" all over table without her knowledge. Blue sock donned as sleeve for L UE in an attempt to increase visual awareness to UE. Pt returned to room via wheelchair and total A. Pt seated with call bell and all other needed items within reach.   Therapy Documentation Precautions:  Precautions Precautions: Fall Precaution Comments: L inattention Restrictions Weight Bearing Restrictions: No ADL: ADL ADL Comments: see FIM  See FIM for current functional status  Therapy/Group: Individual Therapy  Phineas Semen 01/29/2015, 8:54 AM

## 2015-01-30 ENCOUNTER — Inpatient Hospital Stay (HOSPITAL_COMMUNITY): Payer: Medicare Other | Admitting: Occupational Therapy

## 2015-01-30 NOTE — Progress Notes (Signed)
69 y.o. right handed female with history of hypertension, migraine headaches as well as multiple intracranial aneurysms with right MCA aneurysmal clipping 1993 as well as coiling 2010 without residual weakness.. Independent/driving prior to admission living with her husband. Presented 01/20/2015 with decreased balance as well as headache. She had been seen by interventional radiology in the past with plan for image guided cerebral arteriogram for findings a left MCA aneurysm. Underwent left MCA elective embolization using stent assistance 01/20/2015 per Intervention radiology. Patient was extubated noted left-sided tingling and weakness. Code stroke was called. CT of the head showed no acute stroke. CTA was then performed again showing no large vessel occlusion. She was not a TPA candidate. Neurology consulted suspect non-dominant right brain infarct, embolic secondary to complication of interventional neuroradiology aneurysm embolization. Echocardiogram is pending. She currently is maintained on aspirin and Plavix therapy  Subjective/Complaints: Slept well. Denies pain. Has no new complaints today Review of Systems - Negative except as above  Objective: Vital Signs: Blood pressure 153/58, pulse 62, temperature 98.1 F (36.7 C), temperature source Oral, resp. rate 18, weight 78.9 kg (173 lb 15.1 oz), SpO2 95 %. No results found. Results for orders placed or performed during the hospital encounter of 01/22/15 (from the past 72 hour(s))  Basic metabolic panel     Status: Abnormal   Collection Time: 01/27/15 10:50 AM  Result Value Ref Range   Sodium 142 135 - 145 mmol/L   Potassium 3.8 3.5 - 5.1 mmol/L   Chloride 107 96 - 112 mmol/L   CO2 28 19 - 32 mmol/L   Glucose, Bld 125 (H) 70 - 99 mg/dL   BUN 10 6 - 23 mg/dL   Creatinine, Ser 0.69 0.50 - 1.10 mg/dL   Calcium 9.2 8.4 - 10.5 mg/dL   GFR calc non Af Amer 87 (L) >90 mL/min   GFR calc Af Amer >90 >90 mL/min    Comment: (NOTE) The eGFR has  been calculated using the CKD EPI equation. This calculation has not been validated in all clinical situations. eGFR's persistently <90 mL/min signify possible Chronic Kidney Disease.    Anion gap 7 5 - 15  Creatinine, serum     Status: Abnormal   Collection Time: 01/29/15  7:13 AM  Result Value Ref Range   Creatinine, Ser 0.80 0.50 - 1.10 mg/dL   GFR calc non Af Amer 74 (L) >90 mL/min   GFR calc Af Amer 86 (L) >90 mL/min    Comment: (NOTE) The eGFR has been calculated using the CKD EPI equation. This calculation has not been validated in all clinical situations. eGFR's persistently <90 mL/min signify possible Chronic Kidney Disease.      HEENT: normal Cardio: RRR and no murmur Resp: CTA B/L and unlabored GI: BS positive and NT, ND Extremity:  Pulses positive and No Edema Skin:   Bruise multiple bruises LUE>LLE Neuro: Alert/Oriented, Abnormal Sensory Absent LT and proprio LUE.LLE, Abnormal Motor 3/5 LUE, 4/5 LLE with poor motor control but without hyperreflexia and Abnormal FMC Ataxic/ dec FMC Musc/Skel:  Swelling Dorsum Left hand without pain during ROM Gen NAD   Assessment/Plan: 1. Functional deficits secondary to Right MCA infarct with left hemiparesis which require 3+ hours per day of interdisciplinary therapy in a comprehensive inpatient rehab setting. Physiatrist is providing close team supervision and 24 hour management of active medical problems listed below. Physiatrist and rehab team continue to assess barriers to discharge/monitor patient progress toward functional and medical goals. FIM: FIM - Bathing Bathing Steps  Patient Completed: Chest, Right Arm, Left Arm, Abdomen, Front perineal area, Right upper leg, Left upper leg, Buttocks Bathing: 4: Min-Patient completes 8-9 17f10 parts or 75+ percent  FIM - Upper Body Dressing/Undressing Upper body dressing/undressing steps patient completed: Thread/unthread right sleeve of front closure shirt/dress Upper body  dressing/undressing: 2: Max-Patient completed 25-49% of tasks FIM - Lower Body Dressing/Undressing Lower body dressing/undressing steps patient completed: Thread/unthread right underwear leg, Thread/unthread left underwear leg, Thread/unthread right pants leg, Thread/unthread left pants leg Lower body dressing/undressing: 2: Max-Patient completed 25-49% of tasks  FIM - Toileting Toileting steps completed by patient: Performs perineal hygiene, Adjust clothing after toileting Toileting Assistive Devices: Grab bar or rail for support Toileting: 3: Mod-Patient completed 2 of 3 steps  FIM - TRadio producerDevices: Elevated toilet seat, Grab bars Toilet Transfers: 3-To toilet/BSC: Mod A (lift or lower assist), 3-From toilet/BSC: Mod A (lift or lower assist)  FIM - BControl and instrumentation engineerDevices: Arm rests Bed/Chair Transfer: 4: Bed > Chair or W/C: Min A (steadying Pt. > 75%), 4: Chair or W/C > Bed: Min A (steadying Pt. > 75%)  FIM - Locomotion: Wheelchair Distance: 65 Locomotion: Wheelchair: 2: Travels 50 - 149 ft with moderate assistance (Pt: 50 - 74%) FIM - Locomotion: Ambulation Locomotion: Ambulation Assistive Devices: WEthelene HalAmbulation/Gait Assistance: 1: +2 Total assist Locomotion: Ambulation: 1: Two helpers  Comprehension Comprehension Mode: Auditory Comprehension: 6-Follows complex conversation/direction: With extra time/assistive device  Expression Expression Mode: Verbal Expression Assistive Devices: 6-Talk trach valve Expression: 6-Expresses complex ideas: With extra time/assistive device  Social Interaction Social Interaction: 6-Interacts appropriately with others with medication or extra time (anti-anxiety, antidepressant).  Problem Solving Problem Solving: 5-Solves basic 90% of the time/requires cueing < 10% of the time  Memory Memory: 6-More than reasonable amt of time  Medical Problem List and Plan: 1.  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  DVT Prophylaxis/Anticoagulation: SQ Lovenox.monitor for any bleeding episodes 3. Pain Management: tylenol as needed 4. Mood/depression/anxiety: Risperdal 0.5 mg QHS/Desyrel 150 mg QHS,Zoloft 100 mg daily,Xanax 1 mg TID as needed. Team to provide ego-support as well. 5. Neuropsych: This patient is capable of making decisions on her own behalf. 6. Skin/Wound Care: Routine skin checks 7. Fluids/Electrolytes/Nutrition: Strict I & O.Follow up labs 8.Hyperlipidemia.Lipitor/Lovaza 9.  Hypokalemia, supplement LOS (Days) 8 A FACE TO FACE EVALUATION WAS PERFORMED  SWARTZ,ZACHARY T 01/30/2015, 8:48 AM

## 2015-01-30 NOTE — Progress Notes (Signed)
Occupational Therapy Session Note  Patient Details  Name: Diane Hutchinson MRN: [id number] Date of Birth: 20-Nov-1945  Today's Date: 01/30/2015 OT Individual Time: 1100-1200 OT Individual Time Calculation (min): 60 min    Short Term Goals: Week 2:  OT Short Term Goal 1 (Week 2): Pt will perform shower transfer with Mod A squat pivot in order to decrease assistance with functional transfers. OT Short Term Goal 2 (Week 2): Pt will perform toileting with Min A for balance. OT Short Term Goal 3 (Week 2): Pt will independently donn and doff bath mit in shower during bathing tasks. OT Short Term Goal 4 (Week 2): Pt will perform UB dressing with supervision in order to increase I in self care.  Skilled Therapeutic Interventions/Progress Updates:  Upon entering the room, pt supine in bed with head elevated and husband present in the room. Pt performing supine >sit from flat bed with min A and verbal cues for technique. Squat pivot transfer to wheelchair >bed with min A. OT propelled wheelchair in to bathroom for toileting. Mod A squat pivot with use of grab bars onto elevated toilet. Pt voiding in depend before therapist able to assist with clothing. Pt required assistance with clothing management. UB bathing and dressing completed at sink side. Grooming while seated and able to apply toothpaste to toothbrush this session. Therapist applied swedish knee cage and AFO to L foot. Pt performing 5 reps of AROM in all planes with L UE. Pt fatgiued after 5 reps and demonstrating more compensatory movements. OT recommended pt perform AROM again later this afternoon. Pt seated in wheelchair with call bell and all needed items within reach upon exiting the room.   Therapy Documentation Precautions:  Precautions Precautions: Fall Precaution Comments: L inattention Restrictions Weight Bearing Restrictions: No ADL: ADL ADL Comments: see FIM  See FIM for current functional status  Therapy/Group:  Individual Therapy  Phineas Semen 01/30/2015, 12:20 PM

## 2015-01-31 ENCOUNTER — Inpatient Hospital Stay (HOSPITAL_COMMUNITY): Payer: Medicare Other | Admitting: *Deleted

## 2015-01-31 ENCOUNTER — Inpatient Hospital Stay (HOSPITAL_COMMUNITY): Payer: Medicare Other | Admitting: Physical Therapy

## 2015-01-31 NOTE — Progress Notes (Signed)
69 y.o. right handed female with history of hypertension, migraine headaches as well as multiple intracranial aneurysms with right MCA aneurysmal clipping 1993 as well as coiling 2010 without residual weakness.. Independent/driving prior to admission living with her husband. Presented 01/20/2015 with decreased balance as well as headache. She had been seen by interventional radiology in the past with plan for image guided cerebral arteriogram for findings a left MCA aneurysm. Underwent left MCA elective embolization using stent assistance 01/20/2015 per Intervention radiology. Patient was extubated noted left-sided tingling and weakness. Code stroke was called. CT of the head showed no acute stroke. CTA was then performed again showing no large vessel occlusion. She was not a TPA candidate. Neurology consulted suspect non-dominant right brain infarct, embolic secondary to complication of interventional neuroradiology aneurysm embolization. Echocardiogram is pending. She currently is maintained on aspirin and Plavix therapy  Subjective/Complaints: In good spirits. Slept well. No complaints this am.  Review of Systems - Negative except as above  Objective: Vital Signs: Blood pressure 128/57, pulse 68, temperature 98.1 F (36.7 C), temperature source Oral, resp. rate 18, weight 78.9 kg (173 lb 15.1 oz), SpO2 91 %. No results found. Results for orders placed or performed during the hospital encounter of 01/22/15 (from the past 72 hour(s))  Creatinine, serum     Status: Abnormal   Collection Time: 01/29/15  7:13 AM  Result Value Ref Range   Creatinine, Ser 0.80 0.50 - 1.10 mg/dL   GFR calc non Af Amer 74 (L) >90 mL/min   GFR calc Af Amer 86 (L) >90 mL/min    Comment: (NOTE) The eGFR has been calculated using the CKD EPI equation. This calculation has not been validated in all clinical situations. eGFR's persistently <90 mL/min signify possible Chronic Kidney Disease.      HEENT: normal Cardio:  RRR and no murmur Resp: CTA B/L and unlabored GI: BS positive and NT, ND Extremity:  Pulses positive and No Edema Skin:   Bruise multiple bruises LUE>LLE Neuro: Alert/Oriented with reasonable insight and awareness. Abnormal Sensory Absent LT and proprio LUE.LLE, Abnormal Motor 3/5 LUE, 4/5 LLE with poor motor control but without hyperreflexia and Abnormal FMC Ataxic/ dec FMC Musc/Skel:   Dorsum Left hand without pain during ROM Gen NAD   Assessment/Plan: 1. Functional deficits secondary to Right MCA infarct with left hemiparesis which require 3+ hours per day of interdisciplinary therapy in a comprehensive inpatient rehab setting. Physiatrist is providing close team supervision and 24 hour management of active medical problems listed below. Physiatrist and rehab team continue to assess barriers to discharge/monitor patient progress toward functional and medical goals. FIM: FIM - Bathing Bathing Steps Patient Completed: Chest, Right Arm, Left Arm, Abdomen, Front perineal area, Right upper leg, Left upper leg, Buttocks Bathing: 4: Min-Patient completes 8-9 53f10 parts or 75+ percent  FIM - Upper Body Dressing/Undressing Upper body dressing/undressing steps patient completed: Thread/unthread right sleeve of pullover shirt/dresss, Put head through opening of pull over shirt/dress, Pull shirt over trunk Upper body dressing/undressing: 4: Min-Patient completed 75 plus % of tasks FIM - Lower Body Dressing/Undressing Lower body dressing/undressing steps patient completed: Thread/unthread right underwear leg, Thread/unthread left underwear leg, Thread/unthread right pants leg, Thread/unthread left pants leg Lower body dressing/undressing: 2: Max-Patient completed 25-49% of tasks  FIM - Toileting Toileting steps completed by patient: Performs perineal hygiene Toileting Assistive Devices: Grab bar or rail for support Toileting: 2: Max-Patient completed 1 of 3 steps  FIM - TSport and exercise psychologistDevices: Elevated  toilet seat, Grab bars Toilet Transfers: 3-To toilet/BSC: Mod A (lift or lower assist), 3-From toilet/BSC: Mod A (lift or lower assist)  FIM - Bed/Chair Transfer Bed/Chair Transfer Assistive Devices: Arm rests Bed/Chair Transfer: 4: Bed > Chair or W/C: Min A (steadying Pt. > 75%), 4: Chair or W/C > Bed: Min A (steadying Pt. > 75%)  FIM - Locomotion: Wheelchair Distance: 65 Locomotion: Wheelchair: 2: Travels 50 - 149 ft with moderate assistance (Pt: 50 - 74%) FIM - Locomotion: Ambulation Locomotion: Ambulation Assistive Devices: Ethelene Hal Ambulation/Gait Assistance: 1: +2 Total assist Locomotion: Ambulation: 1: Two helpers  Comprehension Comprehension Mode: Auditory Comprehension: 6-Follows complex conversation/direction: With extra time/assistive device  Expression Expression Mode: Verbal Expression Assistive Devices: 6-Talk trach valve Expression: 6-Expresses complex ideas: With extra time/assistive device  Social Interaction Social Interaction: 6-Interacts appropriately with others with medication or extra time (anti-anxiety, antidepressant).  Problem Solving Problem Solving: 5-Solves basic problems: With no assist  Memory Memory: 6-Assistive device: No helper  Medical Problem List and Plan: 1. 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  DVT Prophylaxis/Anticoagulation: SQ Lovenox.monitor for any bleeding episodes 3. Pain Management: tylenol as needed 4. Mood/depression/anxiety: Risperdal 0.5 mg QHS/Desyrel 150 mg QHS,Zoloft 100 mg daily,Xanax 1 mg TID as needed. Team to provide ego-support as well. 5. Neuropsych: This patient is capable of making decisions on her own behalf. 6. Skin/Wound Care: Routine skin checks 7. Fluids/Electrolytes/Nutrition: Strict I & O.Follow up labs 8.Hyperlipidemia.Lipitor/Lovaza 9.  Hypokalemia, supplement LOS (Days) 9 A FACE TO FACE  EVALUATION WAS PERFORMED  SWARTZ,ZACHARY T 01/31/2015, 8:14 AM

## 2015-01-31 NOTE — Progress Notes (Signed)
Physical Therapy Session Note  Patient Details  Name: Diane Hutchinson MRN: [id number] Date of Birth: 06/27/1946  Today's Date: 01/31/2015 PT Individual Time: 1111-1201 PT Individual Time Calculation (min): 50 min   Short Term Goals: Week 1:  PT Short Term Goal 1 (Week 1): Pt will increase transfers supine to edge of bed, edge of bed to supine to min A.  PT Short Term Goal 1 - Progress (Week 1): Met PT Short Term Goal 2 (Week 1): Pt will increase transfers bed to w/c, w/c to bed to mod A.  PT Short Term Goal 2 - Progress (Week 1): Met PT Short Term Goal 3 (Week 1): Pt will increase ambulation with LRAD for 25 feet with mod A.  PT Short Term Goal 3 - Progress (Week 1): Progressing toward goal PT Short Term Goal 4 (Week 1): Pt will propel w/c about 75 feet with S and verbal cues.  PT Short Term Goal 4 - Progress (Week 1): Progressing toward goal PT Short Term Goal 5 (Week 1): Pt will ascend/descend 2 stairs with 1 rail and max A.  PT Short Term Goal 5 - Progress (Week 1): Progressing toward goal  Skilled Therapeutic Interventions/Progress Updates:  Pt was seen bedside in the am. Pt propelled w/c 50 feet with R UE and LE, requiring mod and verbal cues. Pt performed multiple sit to stand transfers with min A and verbal cues. While standing focused on upright posture and weight shifting. Pt ambulated with hemiwalker, L swedish knee cage, L Allard AFO x 5, for 15 feet each time with mod A and verbal cues, also utilized 2 lbs weight L LE for increased proprioceptive input. Pt required constant verbal cues for technique and safety. Pt ambulated with rolling walker, L hand orthosis, L swedish knee cage, L Allard AFO, 15 feet x 1, with max A and verbal cues. Pt transferred sit to stand x 5 without assistive with min guard to min A with verbal cues. Following treatment pt returned to room and left sitting up with call bell within reach.    Therapy Documentation Precautions:   Precautions Precautions: Fall Precaution Comments: L inattention Restrictions Weight Bearing Restrictions: No General:   Pain: No c/o pain.    Locomotion : Ambulation Ambulation/Gait Assistance: 3: Mod assist   See FIM for current functional status  Therapy/Group: Individual Therapy  ,  G 01/31/2015, 2:37 PM  

## 2015-02-01 ENCOUNTER — Inpatient Hospital Stay (HOSPITAL_COMMUNITY): Payer: Medicare Other

## 2015-02-01 ENCOUNTER — Inpatient Hospital Stay (HOSPITAL_COMMUNITY): Payer: Medicare Other | Admitting: Occupational Therapy

## 2015-02-01 ENCOUNTER — Encounter (HOSPITAL_COMMUNITY): Payer: Medicare Other

## 2015-02-01 ENCOUNTER — Inpatient Hospital Stay (HOSPITAL_COMMUNITY): Payer: Medicare Other | Admitting: Rehabilitation

## 2015-02-01 MED ORDER — RISPERIDONE 0.5 MG PO TABS
0.5000 mg | ORAL_TABLET | Freq: Every day | ORAL | Status: DC
  Administered 2015-02-01 – 2015-02-11 (×11): 0.5 mg via ORAL
  Filled 2015-02-01 (×12): qty 1

## 2015-02-01 MED ORDER — TRAZODONE HCL 50 MG PO TABS
150.0000 mg | ORAL_TABLET | Freq: Every day | ORAL | Status: DC
  Administered 2015-02-01 – 2015-02-11 (×11): 150 mg via ORAL
  Filled 2015-02-01 (×11): qty 3

## 2015-02-01 MED ORDER — SERTRALINE HCL 100 MG PO TABS
100.0000 mg | ORAL_TABLET | Freq: Every day | ORAL | Status: DC
  Administered 2015-02-01 – 2015-02-11 (×11): 100 mg via ORAL
  Filled 2015-02-01 (×12): qty 1

## 2015-02-01 MED ORDER — BISACODYL 10 MG RE SUPP
10.0000 mg | Freq: Every day | RECTAL | Status: DC | PRN
  Administered 2015-02-01 – 2015-02-09 (×3): 10 mg via RECTAL
  Filled 2015-02-01 (×2): qty 1

## 2015-02-01 NOTE — Progress Notes (Signed)
Physical Therapy Session Note  Patient Details  Name: Diane Hutchinson MRN: [id number] Date of Birth: [birth year]  Today's Date: 02/01/2015 PT Individual Time: 0800-0900 PT Individual Time Calculation (min): 60 min   Short Term Goals: Week 2:  PT Short Term Goal 1 (Week 2): Pt will perform bed mobility at S level with HOB flat and without rails with mod cues for technique PT Short Term Goal 2 (Week 2): Pt will perform functional transfers to the R and L with min A.  PT Short Term Goal 3 (Week 2): Pt will self propel w/c x 100' using R hemi technique at S level.  PT Short Term Goal 4 (Week 2): Pt will ambulate x 50' with max A (+2 for chair follow) with LRAD.   Skilled Therapeutic Interventions/Progress Updates:    Pt received seated in w/c, agreeable to participate in therapy. Session focused on standing balance, L NMR, w/c propulsion, sit<>stands. Instructed pt in propelling w/c w/ ModA w/ hemi technique w/ assist to maintain straight path and avoid obstacles on L. Instructed pt in transferring w/c>mat table w/ Min-ModA w/ [id number] pivot technique. Instructed pt in multiple sit<>stands w/ overall MinA, however note heavy reliance on RLE with pt avoiding WBing through LLE unless instructed to. Instructed pt in functional activity for LUE NMR grasping yellow clothespins w/ support at L elbow. Noted improved coordination and decreased ataxic movements with 3# weight around wrist. Instructed pt in ambulating w/ hemi-walker on R 8' w/ ModA of 1 with target chair 12' away. During initial gait trial pt unable to make it to target chair and required +2 to move w/c to directly behind pt. During second gait trial pt ambulated w/ +2 for w/c follow and able to ambulate 12' w/ Min-ModA from therapist and hemi-walker, with assist to facilitate hip and thoracic extension. PT transported back to room via w/c w/ TotalA for energy conservation purposes. Pt left seated in w/c w/ all needs within reach.   Therapy  Documentation Precautions:  Precautions Precautions: Fall Precaution Comments: L inattention Restrictions Weight Bearing Restrictions: No Pain: Pain Assessment Pain Assessment: No/denies pain  See FIM for current functional status  Therapy/Group: Individual Therapy  Rada Hay  Rada Hay, PT, DPT 02/01/2015, 7:35 AM

## 2015-02-01 NOTE — Progress Notes (Signed)
69 y.o. right handed female with history of hypertension, migraine headaches as well as multiple intracranial aneurysms with right MCA aneurysmal clipping 1993 as well as coiling 2010 without residual weakness.. Independent/driving prior to admission living with her husband. Presented 01/20/2015 with decreased balance as well as headache. She had been seen by interventional radiology in the past with plan for image guided cerebral arteriogram for findings a left MCA aneurysm. Underwent left MCA elective embolization using stent assistance 01/20/2015 per Intervention radiology. Patient was extubated noted left-sided tingling and weakness. Code stroke was called. CT of the head showed no acute stroke. CTA was then performed again showing no large vessel occlusion. She was not a TPA candidate. Neurology consulted suspect non-dominant right brain infarct, embolic secondary to complication of interventional neuroradiology aneurysm embolization. Echocardiogram is pending. She currently is maintained on aspirin and Plavix therapy  Subjective/Complaints: Patient working with PT, trying to keep track of her left upper extremity Review of Systems - Negative except as above  Objective: Vital Signs: Blood pressure 126/51, pulse 62, temperature 98.4 F (36.9 C), temperature source Oral, resp. rate 18, weight 78.9 kg (173 lb 15.1 oz), SpO2 93 %. No results found. No results found for this or any previous visit (from the past 72 hour(s)).   HEENT: normal Cardio: RRR and no murmur Resp: CTA B/L and unlabored GI: BS positive and NT, ND Extremity:  Pulses positive and No Edema Skin:   Bruise multiple bruises LUE>LLE, do not appear to be new Neuro: Alert/Oriented with reasonable insight and awareness. Abnormal Sensory Absent LT and proprio LUE.LLE, Abnormal Motor 3/5 LUE, 4/5 LLE with poor motor control but without hyperreflexia and Abnormal FMC Ataxic/ dec FMC Musc/Skel:   Dorsum Left hand without pain during  ROM Gen NAD   Assessment/Plan: 1. Functional deficits secondary to Right MCA infarct with left hemiparesis which require 3+ hours per day of interdisciplinary therapy in a comprehensive inpatient rehab setting. Physiatrist is providing close team supervision and 24 hour management of active medical problems listed below. Physiatrist and rehab team continue to assess barriers to discharge/monitor patient progress toward functional and medical goals. FIM: FIM - Bathing Bathing Steps Patient Completed: Chest, Right Arm, Left Arm, Abdomen, Front perineal area, Right upper leg, Left upper leg, Buttocks Bathing: 4: Min-Patient completes 8-9 21f 10 parts or 75+ percent  FIM - Upper Body Dressing/Undressing Upper body dressing/undressing steps patient completed: Thread/unthread right sleeve of pullover shirt/dresss, Put head through opening of pull over shirt/dress, Pull shirt over trunk Upper body dressing/undressing: 4: Min-Patient completed 75 plus % of tasks FIM - Lower Body Dressing/Undressing Lower body dressing/undressing steps patient completed: Thread/unthread right underwear leg, Thread/unthread left underwear leg, Thread/unthread right pants leg, Thread/unthread left pants leg Lower body dressing/undressing: 2: Max-Patient completed 25-49% of tasks  FIM - Toileting Toileting steps completed by patient: Performs perineal hygiene Toileting Assistive Devices: Grab bar or rail for support Toileting: 2: Max-Patient completed 1 of 3 steps  FIM - Radio producer Devices: Elevated toilet seat, Grab bars Toilet Transfers: 3-To toilet/BSC: Mod A (lift or lower assist), 3-From toilet/BSC: Mod A (lift or lower assist)  FIM - Control and instrumentation engineer Devices: Arm rests Bed/Chair Transfer: 4: Bed > Chair or W/C: Min A (steadying Pt. > 75%), 4: Chair or W/C > Bed: Min A (steadying Pt. > 75%)  FIM - Locomotion: Wheelchair Distance:  65 Locomotion: Wheelchair: 1: Travels less than 50 ft with moderate assistance (Pt: 50 - 74%) FIM -  Locomotion: Ambulation Locomotion: Ambulation Assistive Devices: Walker - Hemi, Orthosis (Allard AFO and L swedish knee cage) Ambulation/Gait Assistance: 3: Mod assist Locomotion: Ambulation: 1: Travels less than 50 ft with moderate assistance (Pt: 50 - 74%)  Comprehension Comprehension Mode: Auditory Comprehension: 6-Follows complex conversation/direction: With extra time/assistive device  Expression Expression Mode: Verbal Expression Assistive Devices: 6-Talk trach valve Expression: 6-Expresses complex ideas: With extra time/assistive device  Social Interaction Social Interaction: 6-Interacts appropriately with others with medication or extra time (anti-anxiety, antidepressant).  Problem Solving Problem Solving: 5-Solves basic problems: With no assist  Memory Memory: 6-More than reasonable amt of time  Medical Problem List and Plan: 1. 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  DVT Prophylaxis/Anticoagulation: SQ Lovenox.monitor for any bleeding episodes 3. Pain Management: tylenol as needed 4. Mood/depression/anxiety: Risperdal 0.5 mg QHS/Desyrel 150 mg QHS,Zoloft 100 mg daily,Xanax 1 mg TID as needed. Team to provide ego-support as well. 5. Neuropsych: This patient is capable of making decisions on her own behalf. 6. Skin/Wound Care: Routine skin checks 7. Fluids/Electrolytes/Nutrition: Strict I & O.Follow up labs 8.Hyperlipidemia.Lipitor/Lovaza 9.  Hypokalemia, supplement LOS (Days) 10 A FACE TO FACE EVALUATION WAS PERFORMED  Diane Hutchinson E 02/01/2015, 8:41 AM

## 2015-02-01 NOTE — Progress Notes (Signed)
Occupational Therapy Session Note  Patient Details  Name: Diane Hutchinson MRN: [id number] Date of Birth: 12-May-1946  Today's Date: 02/01/2015 OT Individual Time: 1100-1200 OT Individual Time Calculation (min): 60 min    Short Term Goals: Week 2:  OT Short Term Goal 1 (Week 2): Pt will perform shower transfer with Mod A squat pivot in order to decrease assistance with functional transfers. OT Short Term Goal 2 (Week 2): Pt will perform toileting with Min A for balance. OT Short Term Goal 3 (Week 2): Pt will independently donn and doff bath mit in shower during bathing tasks. OT Short Term Goal 4 (Week 2): Pt will perform UB dressing with supervision in order to increase I in self care.  Skilled Therapeutic Interventions/Progress Updates:    Pt seen for OT ADL bathing and dressing session. Pt in w/c upon arrival, agreeable to tx. Pt completed sponge bath at the sink. She required supervision and cues for management of L UE throughout bathing task. Pt self-initiated use of hand over hand assist during parts of task, however, at times required min A for management of L UE. Pt completed LB bathing with mod steadying assist to stand at the sink to complete pericare/buttock hygiene and to pull pants up in standing. Pt dressed UB with set-up and was able to thread B LEs into pants for LB dressing. TED hose donned by therapist, and pt donned R shoe with supervision, and required assist with L shoe due to management of brace. Pt completed grooming tasks from w/c level with set-up assist required to open containers.    Pt taken to therapy gym for remainder of session. Initially, pt self-proplled to gym using R UE and LE, however, became frustrated with task and requested assist for w/c management. In therapy gym, pt stood x2 trials to complete clothes pin tree. Pt utilized L UE to complete task and  bared weight in L UE during task. Steadying assist required for standing task due to decreased functional  standing balance. Pt completed sit<> stand with supervision and cues for technique.    Pt returned to room at end of session, left in w/c with all needs in reach.  OT provided pt with arm trough w/c attachment. Pt with lap board upon arrival to session, however, pt demonstrated decreased control over L UE needed in order to keep L UE on lap tray. Pt voiced pleasure with the arm trough attachment.    Pt becomes easily frustrated and aggrivated during difficult tasks, requiring cues for deep breathing and to break down the task into simplified steps. Cues needed throughout for safety and management of L UE.   Therapy Documentation Precautions:  Precautions Precautions: Fall Precaution Comments: L inattention Restrictions Weight Bearing Restrictions: No Pain: Pain Assessment Pain Assessment: No/denies pain ADL: ADL ADL Comments: see FIM  See FIM for current functional status  Therapy/Group: Individual Therapy  Lewis, Michall Noffke C 02/01/2015, 12:17 PM

## 2015-02-01 NOTE — Progress Notes (Signed)
Physical Therapy Session Note  Patient Details  Name: Diane Hutchinson MRN: [id number] Date of Birth: June 02, 1946  Today's Date: 02/01/2015 PT Individual Time: 1300-1400 PT Individual Time Calculation (min): 60 min   Short Term Goals: Week 2:  PT Short Term Goal 1 (Week 2): Pt will perform bed mobility at S level with HOB flat and without rails with mod cues for technique PT Short Term Goal 2 (Week 2): Pt will perform functional transfers to the R and L with min A.  PT Short Term Goal 3 (Week 2): Pt will self propel w/c x 100' using R hemi technique at S level.  PT Short Term Goal 4 (Week 2): Pt will ambulate x 50' with max A (+2 for chair follow) with LRAD.   Skilled Therapeutic Interventions/Progress Updates:   Pt received sitting in w/c in room, agreeable to therapy session.  Assisted pt to/from therapy gym for time management and energy conservation.  Once in therapy gym, skilled session focused on gait without AD in order to determine gait impairments and new handling techniques from therapy supervisor.  Performed two bouts of gait x 30' each with L allard AFO and without knee cage today.  Note that during first gait bout, not over hyperextension at knee unless she took too large of a step on LLE and needed assist for upright posture.  Supervisor suggested use of hand to approximate pressure through LLE during L stance and also use of arm at hips for increased anterior pelvic tilt.  Note marked improvement with this technique and assist for forward weight shift over LLE.  Therapy supervisor then stood with pt in order to find best technique for stabilization at L LE and for L hip protraction.  Note large bruise on inside of L thigh causing increase pain with facilitation, therefore DC'd this method of assist.  Ended session with gait on Lite Gait body weight support system over treadmill.  Performed 3 bouts of gait approx 50-60' at a time between 0.3-0.4 mph in order to work on slow  controlled stepping >faster more normal gait pattern.  Provided assist from the R for increased weight shift to the L, assist on the L at glutes for increased hip extension and at L foot/leg to advance during swing and to stabilize during stance.  Tolerated well with increased anxiety noted, but better with cues for pursed lip breathing.  Ended with gait x 75' in hallway via R HHA and PT under L axilla with focus on increasing speed of gait for more natural pattern.  Note marked improvement in placement and WB through LLE with more controlled step on RLE (slower).  Pt assisted back to room and into restroom. Mod A stand pivot to/from toilet and manage clothing.  Pt left in w/c with all needs in reach.    Therapy Documentation Precautions:  Precautions Precautions: Fall Precaution Comments: L inattention Restrictions Weight Bearing Restrictions: No   Vital Signs: Therapy Vitals Temp: 97.7 F (36.5 C) Temp Source: Oral Pulse Rate: 75 Resp: 17 BP: (!) 149/72 mmHg Patient Position (if appropriate): Sitting Oxygen Therapy SpO2: 96 % O2 Device: Not Delivered Pain: Pt with pain in medial part of L knee during gait, provided increased stabilization as needed with improvement noted.   See FIM for current functional status  Therapy/Group: Individual Therapy  [name] 02/01/2015, 3:31 PM

## 2015-02-02 ENCOUNTER — Telehealth (HOSPITAL_COMMUNITY): Payer: Self-pay

## 2015-02-02 ENCOUNTER — Inpatient Hospital Stay (HOSPITAL_COMMUNITY): Payer: Medicare Other | Admitting: Occupational Therapy

## 2015-02-02 ENCOUNTER — Inpatient Hospital Stay (HOSPITAL_COMMUNITY): Payer: Medicare Other | Admitting: Physical Therapy

## 2015-02-02 NOTE — Progress Notes (Signed)
Physical Therapy Session Note  Patient Details  Name: Diane Hutchinson MRN: [id number] Date of Birth: 1946/08/19  Today's Date: 02/02/2015 PT Individual Time: 0900-1000 PT Individual Time Calculation (min): 60 min   Short Term Goals: Week 1:  PT Short Term Goal 1 (Week 1): Pt will increase transfers supine to edge of bed, edge of bed to supine to min A.  PT Short Term Goal 1 - Progress (Week 1): Met PT Short Term Goal 2 (Week 1): Pt will increase transfers bed to w/c, w/c to bed to mod A.  PT Short Term Goal 2 - Progress (Week 1): Met PT Short Term Goal 3 (Week 1): Pt will increase ambulation with LRAD for 25 feet with mod A.  PT Short Term Goal 3 - Progress (Week 1): Progressing toward goal PT Short Term Goal 4 (Week 1): Pt will propel w/c about 75 feet with S and verbal cues.  PT Short Term Goal 4 - Progress (Week 1): Progressing toward goal PT Short Term Goal 5 (Week 1): Pt will ascend/descend 2 stairs with 1 rail and max A.  PT Short Term Goal 5 - Progress (Week 1): Progressing toward goal  Skilled Therapeutic Interventions/Progress Updates:   Patient received in w/c.  Pt performed w/c mobility training with R hemi technique x 100' with mod A at RLE for sequencing and navigation; pt also required verbal cues for attention to L environment.  Performed gait training in hallway with RUE support on wall rail and therapist under axilla x 25' forwards and retro x 2 reps with focus on attention to LLE, motor control, timing sequencing and muscle grading of swing phase and full lateral and anterior weight shift over LLE in stance.  During retro gait focus on maintaining upright posture and isolated activation of L hamstring and glutes for retro stepping LLE + weight shifting lateral and posterior.  Pt demonstrating improved BOS, step and stride length with increased weight shift to L and improved L knee control in stance.  Performed stair negotiation training with +2 A with RUE support on rail  and LUE over therapist with mod-max A and verbal cues for step to sequencing, lateral and anterior weight shifting and to fully advance and place LLE.  Pt became very anxious on stairs and felt that she was being pushed to the R; allowed pt to self correct balance and pt able to complete 4 stairs.  Performed NMR; see below.  Returned to room and pt left with all items within reach.   Therapy Documentation Precautions:  Precautions Precautions: Fall Precaution Comments: L inattention Restrictions Weight Bearing Restrictions: No Pain: Pain Assessment Pain Assessment: No/denies pain Locomotion : Ambulation Ambulation/Gait Assistance: 3: Mod assist Wheelchair Mobility Distance: 100  Other Treatments: Treatments Neuromuscular Facilitation: Left;Upper Extremity;Lower Extremity;Activity to increase coordination;Forced use;Activity to increase motor control;Activity to increase timing and sequencing;Activity to increase grading;Activity to increase sustained activation;Activity to increase lateral weight shifting;Activity to increase anterior-posterior weight shifting  First attempting quadruped but due to pain in LLE unable.  Transitioned to sitting on mat and shifting weight forwards over BOS with UE support on arm rests of w/c and maintaining WB through LUE and LLE during RLE stepping forwards and back; required mod-max A on L side for LUE placement and stability.   See FIM for current functional status  Therapy/Group: Individual Therapy  Diane Hutchinson [facility] 02/02/2015, 1:29 PM

## 2015-02-02 NOTE — Progress Notes (Signed)
69 y.o. right handed female with history of hypertension, migraine headaches as well as multiple intracranial aneurysms with right MCA aneurysmal clipping 1993 as well as coiling 2010 without residual weakness.. Independent/driving prior to admission living with her husband. Presented 01/20/2015 with decreased balance as well as headache. She had been seen by interventional radiology in the past with plan for image guided cerebral arteriogram for findings a left MCA aneurysm. Underwent left MCA elective embolization using stent assistance 01/20/2015 per Intervention radiology. Patient was extubated noted left-sided tingling and weakness. Code stroke was called. CT of the head showed no acute stroke. CTA was then performed again showing no large vessel occlusion. She was not a TPA candidate. Neurology consulted suspect non-dominant right brain infarct, embolic secondary to complication of interventional neuroradiology aneurysm embolization. Echocardiogram is pending. She currently is maintained on aspirin and Plavix therapy  Subjective/Complaints: Patient working with OT, ADL program Review of Systems - Negative except as above  Objective: Vital Signs: Blood pressure 130/96, pulse 65, temperature 97.7 F (36.5 C), temperature source Oral, resp. rate 18, height 5\' 11"  (1.803 m), weight 78.382 kg (172 lb 12.8 oz), SpO2 100 %. No results found. No results found for this or any previous visit (from the past 72 hour(s)).   HEENT: normal Cardio: RRR and no murmur Resp: CTA B/L and unlabored GI: BS positive and NT, ND Extremity:  Pulses positive and No Edema Skin:   Bruise multiple bruises LUE>LLE, do not appear to be new Neuro: Alert/Oriented with reasonable insight and awareness. Abnormal Sensory Absent LT and proprio LUE.LLE, Abnormal Motor 3/5 LUE, 4/5 LLE with poor motor control but without hyperreflexia and Abnormal FMC Ataxic/ dec FMC Musc/Skel:   Dorsum Left hand without pain during ROM Gen  NAD   Assessment/Plan: 1. Functional deficits secondary to Right MCA infarct with left hemiparesis which require 3+ hours per day of interdisciplinary therapy in a comprehensive inpatient rehab setting. Physiatrist is providing close team supervision and 24 hour management of active medical problems listed below. Physiatrist and rehab team continue to assess barriers to discharge/monitor patient progress toward functional and medical goals. Team conf in am FIM: FIM - Bathing Bathing Steps Patient Completed: Chest, Right Arm, Left Arm, Abdomen, Front perineal area, Right upper leg, Left upper leg, Buttocks Bathing: 4: Steadying assist  FIM - Upper Body Dressing/Undressing Upper body dressing/undressing steps patient completed: Thread/unthread right sleeve of pullover shirt/dresss, Put head through opening of pull over shirt/dress, Pull shirt over trunk, Thread/unthread left sleeve of pullover shirt/dress Upper body dressing/undressing: 5: Set-up assist to: Obtain clothing/put away FIM - Lower Body Dressing/Undressing Lower body dressing/undressing steps patient completed: Thread/unthread right underwear leg, Thread/unthread left underwear leg, Thread/unthread right pants leg, Thread/unthread left pants leg, Don/Doff right sock, Don/Doff left sock, Don/Doff right shoe Lower body dressing/undressing: 3: Mod-Patient completed 50-74% of tasks  FIM - Toileting Toileting steps completed by patient: Performs perineal hygiene Toileting Assistive Devices: Grab bar or rail for support Toileting: 2: Max-Patient completed 1 of 3 steps  FIM - Radio producer Devices: Elevated toilet seat, Grab bars Toilet Transfers: 3-To toilet/BSC: Mod A (lift or lower assist), 3-From toilet/BSC: Mod A (lift or lower assist)  FIM - Control and instrumentation engineer Devices: Arm rests Bed/Chair Transfer: 3: Chair or W/C > Bed: Mod A (lift or lower assist)  FIM -  Locomotion: Wheelchair Distance: 65 Locomotion: Wheelchair: 2: Travels 50 - 149 ft with moderate assistance (Pt: 50 - 74%) FIM - Locomotion: Ambulation Locomotion:  Ambulation Assistive Devices: Walker - Hemi, Orthosis (toe off AFO and knee cage on L) Ambulation/Gait Assistance: 3: Mod assist (+2 for w/c follow) Locomotion: Ambulation: 1: Two helpers  Comprehension Comprehension Mode: Auditory Comprehension: 6-Follows complex conversation/direction: With extra time/assistive device  Expression Expression Mode: Verbal Expression Assistive Devices: 6-Talk trach valve Expression: 5-Expresses complex 90% of the time/cues < 10% of the time  Social Interaction Social Interaction: 5-Interacts appropriately 90% of the time - Needs monitoring or encouragement for participation or interaction.  Problem Solving Problem Solving: 5-Solves complex 90% of the time/cues < 10% of the time  Memory Memory: 6-More than reasonable amt of time  Medical Problem List and Plan: 1. 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  DVT Prophylaxis/Anticoagulation: SQ Lovenox.monitor for any bleeding episodes 3. Pain Management: tylenol as needed 4. Mood/depression/anxiety: Risperdal 0.5 mg QHS/Desyrel 150 mg QHS,Zoloft 100 mg daily,Xanax 1 mg TID as needed. Team to provide ego-support as well. 5. Neuropsych: This patient is capable of making decisions on her own behalf. 6. Skin/Wound Care: Routine skin checks 7. Fluids/Electrolytes/Nutrition: Strict I & O.Follow up labs 8.Hyperlipidemia.Lipitor/Lovaza 9.  Hypokalemia, supplement LOS (Days) 11 A FACE TO FACE EVALUATION WAS PERFORMED  KIRSTEINS,ANDREW E 02/02/2015, 9:15 AM

## 2015-02-02 NOTE — Progress Notes (Signed)
Occupational Therapy Session Note  Patient Details  Name: Diane Hutchinson MRN: [id number] Date of Birth: [birth year]  Today's Date: 02/02/2015 OT Individual Time: 0800-0900 OT Individual Time Calculation (min): 60 min    Short Term Goals: Week 2:  OT Short Term Goal 1 (Week 2): Pt will perform shower transfer with Mod A squat pivot in order to decrease assistance with functional transfers. OT Short Term Goal 2 (Week 2): Pt will perform toileting with Min A for balance. OT Short Term Goal 3 (Week 2): Pt will independently donn and doff bath mit in shower during bathing tasks. OT Short Term Goal 4 (Week 2): Pt will perform UB dressing with supervision in order to increase I in self care.  Skilled Therapeutic Interventions/Progress Updates:   Pt performed bathing and dressing sit to stand at the sink.  She reported incontinent episode overnight so nursing assisted with washing legs and peri area as well as donning brief prior to session.  She performed stand pivot transfer from bed to wheelchair with mod assist.  Therapist had to provide assistance with advancing the LLE, secondary to pt not being able to see it and unable to feel its positioning.  She was able to perform UB bathing with min assist overall.  Mod assist with mod instructional cueing to use the LUE to assist with squeezing out washcloth, holding deodorant, and washing the LUE.  She was able to [employer] with increased time and supervision.  Increased time also needed for donning pants by crossing the LLE over the right knee.  Sit to stand with min assist when pulling pants and over hips with pt needing mod assist to actually pull them up.  Therapist donned TEDs bilaterally and she was able to donn and fasten her right shoe.  Min assist needed for left shoe with AFO.      Therapy Documentation Precautions:  Precautions Precautions: Fall Precaution Comments: L inattention Restrictions Weight Bearing Restrictions:  No  Pain: Pain Assessment Pain Assessment: No/denies pain ADL: See FIM for current functional status  Therapy/Group: Individual Therapy  Danayah Smyre OTR/L 02/02/2015, 12:13 PM

## 2015-02-02 NOTE — Telephone Encounter (Signed)
Called pt to schedule 2 week f/u, no answer. Left voicemail for pt to call back. AW

## 2015-02-02 NOTE — Progress Notes (Signed)
Occupational Therapy Session Note  Patient Details  Name: Diane Hutchinson MRN: [id number] Date of Birth: [birth year]  Today's Date: 02/02/2015 OT Individual Time: 1400-1500 OT Individual Time Calculation (min): 60 min    Short Term Goals: Week 1:  OT Short Term Goal 1 (Week 1): Pt will complete squat-pivot transfer to drop arm BSC with min assist OT Short Term Goal 1 - Progress (Week 1): Progressing toward goal OT Short Term Goal 2 (Week 1): Pt will complete upper body dressing with supervision OT Short Term Goal 2 - Progress (Week 1): Not met OT Short Term Goal 3 (Week 1): Pt will complete lower body dressing with mod assist OT Short Term Goal 3 - Progress (Week 1): Not met OT Short Term Goal 4 (Week 1): Pt will complete 1 of 5 grooming tasks standing supported at sink with min guard assist OT Short Term Goal 4 - Progress (Week 1): Not met OT Short Term Goal 5 (Week 1): Pt will demo ability to wash left lower leg with left hand using wash mit with supervision OT Short Term Goal 5 - Progress (Week 1): Progressing toward goal Week 2:  OT Short Term Goal 1 (Week 2): Pt will perform shower transfer with Mod A squat pivot in order to decrease assistance with functional transfers. OT Short Term Goal 2 (Week 2): Pt will perform toileting with Min A for balance. OT Short Term Goal 3 (Week 2): Pt will independently donn and doff bath mit in shower during bathing tasks. OT Short Term Goal 4 (Week 2): Pt will perform UB dressing with supervision in order to increase I in self care.  Skilled Therapeutic Interventions/Progress Updates:    1:1 co treat with RT. Pt found in room in w/c with left foot propped up on bed. Donned AFO. Transitioned into ADL kitchen focused on functional use of LUE, attention to the left, seated and standing balance, kitchen safety. Task of opening a box of popcorn and cooking it in the microwave with focus on visual attention and coordination of left LE . At table top  focus on picking up pieces of popcorn using pincher grip with left LE with attention to slow and coordinated movement and visual attention to left in sitting and then transitioned into standing. In standing, focused on weight shifting onto left Le with pelvis and right left knee stabilized. Pt required max A to maintain standing at midline and weight through left LE. Education provided and demonstrated on functional mobility in kitchen environment at w/c level. Pt left in room in w/c at end of session.  Therapy Documentation Precautions:  Precautions Precautions: Fall Precaution Comments: L inattention Restrictions Weight Bearing Restrictions: No Pain: Pain Assessment Pain Assessment: No/denies pain ADL: ADL ADL Comments: see FIM Exercises:   Other Treatments: Treatments Neuromuscular Facilitation: Left;Upper Extremity;Lower Extremity;Activity to increase coordination;Forced use;Activity to increase motor control;Activity to increase timing and sequencing;Activity to increase grading;Activity to increase sustained activation;Activity to increase lateral weight shifting;Activity to increase anterior-posterior weight shifting  See FIM for current functional status  Therapy/Group: Individual Therapy  Willeen Cass [facility] 02/02/2015, 4:27 PM

## 2015-02-02 NOTE — Progress Notes (Signed)
Recreational Therapy Session Note  Patient Details  Name: Diane Hutchinson MRN: [id number] Date of Birth: 1946-01-26 Today's Date: 02/02/2015  Pain: no c/o Skilled Therapeutic Interventions/Progress Updates: Session focused on functional use of LUE, attention to the left, seated and standing balance, kitchen safety during co-treat with OT.  Pt used microwave to pop a bag of popcorn with remainder of session focused on fine motor control, coordination, and attention to LUE.  Pt required mod verbal cues to attend to LUE and to decrease rate of speed.    Therapy/Group: Co-Treatment  Diyan Dave 02/02/2015, 4:37 PM

## 2015-02-02 NOTE — Consult Note (Signed)
  INITIAL DIAGNOSTIC EVALUATION - [facility]   MEDICAL NECESSITY:  Diane Hutchinson was seen on the [facility] for an initial diagnostic evaluation owing to the patient's diagnosis of embolic cerebral infarction.   According to medical records, Mrs. Lopata was admitted to the rehab unit owing to "Functional deficits secondary to right brain infarct after elective left MCA embolization." Records also indicate that she is a "69 y.o. right handed female with history of hypertension, migraine headaches as well as multiple intracranial aneurysms with right MCA aneurysmal clipping 1993 as well as coiling 2010 without residual weakness.Marland KitchenMarland KitchenUnderwent left MCA elective embolization using stent assistance 01/20/2015 per Intervention radiology. Patient was extubated noted left-sided tingling and weakness. Code stroke was called. CT of the head showed no acute stroke. CTA was then performed again showing no large vessel occlusion. She was not a TPA candidate. Neurology consulted suspect non-dominant right brain infarct, embolic secondary to complication of interventional neuroradiology aneurysm embolization."   During today's visit, Mrs. Mccomber denied suffering from any cognitive issues. She experiences left arm and leg weakness as well as decreased sensation in her left face. From an emotional standpoint, her mood was admittedly very depressed immediately following the CVA but these symptoms subsided quickly. She has a history of mental health treatment and has been diagnosed with Bipolar Disorder. She is treated with trazadone, Xanax, and Zoloft which she finds to be helpful. She said she does not take a mood stabilizer. Patient has participated in counseling in the past which was somewhat beneficial. No adjustment issues endorsed. Suicidal/homicidal ideation, plan or intent was denied. No manic or hypomanic episodes were reported. The  patient denied ever experiencing any auditory/visual hallucinations. No major behavioral or personality changes were endorsed.   Patient feels she has been making strides in therapy. Staff has been mostly good with the exception of one or two "second shift" nurses who allegedly will not give her medications at the appropriate time (i.e., 8PM instead of 9 or 10). She has ample support mostly from her husband who visits and calls. No barriers to therapy identified.  PROCEDURES ADMINISTERED: [1 unit 85631] Diagnostic clinical interview  Review of available records  Behavioral Evaluation: Mrs. Sanabia was appropriately dressed for season and situation, and she appeared tidy and well-groomed. Normal posture was noted. She was friendly and rapport was easily established. Her speech was as expected and she was able to express ideas effectively. Her affect was appropriately modulated. Attention and motivation were good.    IMPRESSION: Mrs. Trivedi denied suffering from any major cognitive symptoms post-stroke and no blatant challenges were observed. She described herself to be in generally good spirits and her Bipolar Disorder symptoms are reportedly well-controlled even though she is not taking a mood stabilizer. She is followed by a psychiatrist. Mrs. Brannick seems to be adjusting well to her admission and no barriers to therapy were ideitnifed. At this time, no formal follow-up with neuropsychology will be scheduled unless request by the patient or staff.     Rutha Bouchard, Psy.D.  Clinical Neuropsychologist

## 2015-02-03 ENCOUNTER — Inpatient Hospital Stay (HOSPITAL_COMMUNITY): Payer: Medicare Other | Admitting: Rehabilitation

## 2015-02-03 ENCOUNTER — Inpatient Hospital Stay (HOSPITAL_COMMUNITY): Payer: Medicare Other | Admitting: Occupational Therapy

## 2015-02-03 NOTE — Patient Care Conference (Signed)
Inpatient RehabilitationTeam Conference and Plan of Care Update Date: 02/03/2015   Time: 11;15 AM    Patient Name: Diane Hutchinson      Medical Record Number: [id number]  Date of Birth: [birth year] Sex: Female         Room/Bed: 4W07C/4W07C-01 Payor Info: Payor: MEDICARE / Plan: MEDICARE PART A AND B / Product Type: *No Product type* /    Admitting Diagnosis: RT CVA  Admit Date/Time:  01/22/2015  4:03 PM Admission Comments: No comment available   Primary Diagnosis:  <principal problem not specified> Principal Problem: <principal problem not specified>  Patient Active Problem List   Diagnosis Date Noted  . Alterations of sensations following CVA (cerebrovascular accident)   . Embolic cerebral infarction 01/22/2015  . Left hemiparesis 01/22/2015  . Stroke   . Cerebral embolism with cerebral infarction 01/21/2015  . Ataxia   . Brain aneurysm 01/20/2015  . TOBACCO ABUSE 01/04/2010  . BRONCHITIS, CHRONIC 01/04/2010  . CERVICAL CANCER 01/03/2010  . HYPERLIPIDEMIA 01/03/2010  . DEPRESSION 01/03/2010  . SUBARACHNOID HEMORRHAGE 01/03/2010  . [name] 01/03/2010    Expected Discharge Date: Expected Discharge Date: 02/19/15  Team Members Present: Physician leading conference: Dr. Alysia Penna Social Worker Present: Ovidio Kin, LCSW Nurse Present: Heather Roberts, RN PT Present: Cameron Sprang, PT OT Present: Benay Pillow, OT PPS Coordinator present : Daiva Nakayama, RN, CRRN     Current Status/Progress Goal Weekly Team Focus  Medical   psych stable , severe sensory loss, transfers improving Mod A hemi walker , needs L AFO  home with supervision  orthotic management   Bowel/Bladder   continent of bowel and bladder. brief inplace due to urgancy  To continue continent to bladder and bowel and manage min assist  cont. stool softeners and encourage BSC   Swallow/Nutrition/ Hydration     na        ADL's   min assist UB bathing and LB bathing sit to stand, UB dressing  min-supervision, min to mod for LB dressing, mod assist for toilet transfers and toileting tasks.  Still with ataxia LUE and LLE requring min to mod assist for basic selfcare tasks.  supervision overall; LB dressing-min A  selfcare re-training, transfer training, neuromuscular re-education, balance re-training, pt/family education   Mobility   Pt currently min A for bed mobility, min A squat pivot transfers, min/mod for sit<>stand and max A for dynamic standing to facilitate decreased compensations.   S overall (will likely downgrade to minA on next weekly note)  LUE/LE NMR, transfers, standing balance, gait, pt/family education.    Communication     na        Safety/Cognition/ Behavioral Observations    no unsafe behaviors        Pain   no complaints of pain         Skin   scattered bruising. right groin foam inplace due tp incision with mild drainage  remain free of skin breakdown and free of infection  assess skin q shift      *See Care Plan and progress notes for long and short-term goals.  Barriers to Discharge: poor awareness of left    Possible Resolutions to Barriers:  cont rehab    Discharge Planning/Teaching Needs:  Home with husband who has been here and observed in therapies, can provide 24 hr care      Team Discussion:  Making good progress-will need AFO for left leg prior to discharge. Brighter outlook. SP not following. Can  get ambulatory if stays until 4/8. Husband to take FMLA to provide care to pt at discharge.  Revisions to Treatment Plan:  None   Continued Need for Acute Rehabilitation Level of Care: The patient requires daily medical management by a physician with specialized training in physical medicine and rehabilitation for the following conditions: Daily direction of a multidisciplinary physical rehabilitation program to ensure safe treatment while eliciting the highest outcome that is of practical value to the patient.: Yes Daily medical management of  patient stability for increased activity during participation in an intensive rehabilitation regime.: Yes Daily analysis of laboratory values and/or radiology reports with any subsequent need for medication adjustment of medical intervention for : Neurological problems;Other  Kayle Correa, Gardiner Rhyme 02/03/2015, 2:20 PM

## 2015-02-03 NOTE — Progress Notes (Signed)
69 y.o. right handed female with history of hypertension, migraine headaches as well as multiple intracranial aneurysms with right MCA aneurysmal clipping 1993 as well as coiling 2010 without residual weakness.. Independent/driving prior to admission living with her husband. Presented 01/20/2015 with decreased balance as well as headache. She had been seen by interventional radiology in the past with plan for image guided cerebral arteriogram for findings a left MCA aneurysm. Underwent left MCA elective embolization using stent assistance 01/20/2015 per Intervention radiology. Patient was extubated noted left-sided tingling and weakness. Code stroke was called. CT of the head showed no acute stroke. CTA was then performed again showing no large vessel occlusion. She was not a TPA candidate. Neurology consulted suspect non-dominant right brain infarct, embolic secondary to complication of interventional neuroradiology aneurysm embolization.  She currently is maintained on aspirin and Plavix therapy  Subjective/Complaints: Good night, received medications on time Review of Systems - Negative except as above  Objective: Vital Signs: Blood pressure 152/57, pulse 65, temperature 98.4 F (36.9 C), temperature source Oral, resp. rate 18, height '5\' 11"'  (1.803 m), weight 78.382 kg (172 lb 12.8 oz), SpO2 91 %. No results found. No results found for this or any previous visit (from the past 72 hour(s)).   HEENT: normal Cardio: RRR and no murmur Resp: CTA B/L and unlabored GI: BS positive and NT, ND Extremity:  Pulses positive and No Edema Skin:   Bruise multiple bruises LUE>LLE, do not appear to be new Neuro: Alert/Oriented with reasonable insight and awareness. Abnormal Sensory Absent LT and proprio LUE.LLE, Abnormal Motor 3/5 LUE, 4/5 LLE with poor motor control but without hyperreflexia and Abnormal FMC Ataxic/ dec FMC Musc/Skel:   [place] Left hand without pain during ROM Gen  NAD   Assessment/Plan: 1. Functional deficits secondary to Right MCA infarct with left hemiparesis which require 3+ hours per day of interdisciplinary therapy in a comprehensive inpatient rehab setting. Physiatrist is providing close team supervision and 24 hour management of active medical problems listed below. Physiatrist and rehab team continue to assess barriers to discharge/monitor patient progress toward functional and medical goals. Team conference today please see physician documentation under team conference tab, met with team face-to-face to discuss problems,progress, and goals. Formulized individual treatment plan based on medical history, underlying problem and comorbidities. FIM: FIM - Bathing Bathing Steps Patient Completed: Chest, Left Arm, Abdomen Bathing: 4: Min-Patient completes 8-9 30f10 parts or 75+ percent  FIM - Upper Body Dressing/Undressing Upper body dressing/undressing steps patient completed: Thread/unthread right sleeve of pullover shirt/dresss, Put head through opening of pull over shirt/dress, Pull shirt over trunk, Thread/unthread left sleeve of pullover shirt/dress Upper body dressing/undressing: 5: Supervision: Safety issues/verbal cues FIM - Lower Body Dressing/Undressing Lower body dressing/undressing steps patient completed: Thread/unthread right underwear leg, Thread/unthread left underwear leg, Thread/unthread right pants leg, Thread/unthread left pants leg, Don/Doff right sock, Don/Doff left sock, Don/Doff right shoe, Fasten/unfasten left shoe, Fasten/unfasten right shoe, Fasten/unfasten pants Lower body dressing/undressing: 3: Mod-Patient completed 50-74% of tasks  FIM - Toileting Toileting steps completed by patient: Performs perineal hygiene Toileting Assistive Devices: Grab bar or rail for support Toileting: 2: Max-Patient completed 1 of 3 steps  FIM - TRadio producerDevices: Elevated toilet seat, Grab bars Toilet  Transfers: 3-To toilet/BSC: Mod A (lift or lower assist), 3-From toilet/BSC: Mod A (lift or lower assist)  FIM - BControl and instrumentation engineerDevices: Arm rests Bed/Chair Transfer: 3: Bed > Chair or W/C: Mod A (lift or lower assist),  3: Chair or W/C > Bed: Mod A (lift or lower assist)  FIM - Locomotion: Wheelchair Distance: 100 Locomotion: Wheelchair: 2: Travels 50 - 149 ft with moderate assistance (Pt: 50 - 74%) FIM - Locomotion: Ambulation Locomotion: Ambulation Assistive Devices: Orthosis, Other (comment) (wall rail) Ambulation/Gait Assistance: 3: Mod assist Locomotion: Ambulation: 2: Travels 50 - 149 ft with moderate assistance (Pt: 50 - 74%)  Comprehension Comprehension Mode: Auditory Comprehension: 5-Understands complex 90% of the time/Cues < 10% of the time  Expression Expression Mode: Verbal Expression Assistive Devices: 6-Talk trach valve Expression: 5-Expresses complex 90% of the time/cues < 10% of the time  Social Interaction Social Interaction: 5-Interacts appropriately 90% of the time - Needs monitoring or encouragement for participation or interaction.  Problem Solving Problem Solving: 5-Solves complex 90% of the time/cues < 10% of the time  Memory Memory: 5-Requires cues to use assistive device  Medical Problem List and Plan: 1. 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  DVT Prophylaxis/Anticoagulation: SQ Lovenox.monitor for any bleeding episodes 3. Pain Management: tylenol as needed 4. Mood/depression/anxiety: Risperdal 0.5 mg QHS/Desyrel 150 mg QHS,Zoloft 100 mg daily,Xanax 1 mg TID as needed. Team to provide ego-support as well. 5. Neuropsych: This patient is capable of making decisions on her own behalf. 6. Skin/Wound Care: Routine skin checks 7. Fluids/Electrolytes/Nutrition: Strict I & O.Follow up labs 8.Hyperlipidemia.Lipitor/Lovaza 9.  Hypokalemia, supplement LOS  (Days) 12 A FACE TO FACE EVALUATION WAS PERFORMED  KIRSTEINS,ANDREW E 02/03/2015, 9:25 AM

## 2015-02-03 NOTE — Progress Notes (Signed)
Physical Therapy Session Note  Patient Details  Name: Diane Hutchinson MRN: [id number] Date of Birth: 1945/11/15  Today's Date: 02/03/2015 PT Individual Time: 1500-1600 PT Individual Time Calculation (min): 60 min   Short Term Goals: Week 2:  PT Short Term Goal 1 (Week 2): Pt will perform bed mobility at S level with HOB flat and without rails with mod cues for technique PT Short Term Goal 2 (Week 2): Pt will perform functional transfers to the R and L with min A.  PT Short Term Goal 3 (Week 2): Pt will self propel w/c x 100' using R hemi technique at S level.  PT Short Term Goal 4 (Week 2): Pt will ambulate x 50' with max A (+2 for chair follow) with LRAD.   Skilled Therapeutic Interventions/Progress Updates:   Pt received sitting in w/c in room, agreeable to therapy session.  Assisted pt to/from therapy gym via w/c at total A level for time management and energy conservation.  Skilled session focused on functional gait with use of hemi walker and then RW with L hand orthosis.  Performed gait x 75' and another 85' with hemi walker at min/mod A level with L allard AFO but without knee cage.  Pt requires constant cues for sequencing with hemi walker and upright posture.  Also provided light facilitation for increased weight shift to the L and for upright posture.  Intermittently provided light assist at L knee posteriorly to prevent hyperextension (only noted with increased fatigue).  Assisted back into therapy gym to assess gait with use of RW and L hand orthosis.  Pt initially very hesitant and resistant due to past family members using RW.  Performed 2 bouts of 45' with RW at min A level.  Note that she tends to get very anxious when turning and wants to sit very quickly, and therefore requires mod cues for safety.  Ended session with negotiation of cones with RW as she has increased difficulty with turns.  Pt negotiated with mod A and max cues needed for advancing LLE at times, esp when  turning to the L.  Pt continued to be frustrated with longer discharge date and visit from neuropysch.  Educated on leaving earlier and being at a w/c level or staying longer and being able to walk.  Also discussed benefits of neuropysch visit and reason for them speaking with her.  Pt states she did not want to see them, therefore stated that she did not have to.  Pt assisted back to w/c and back to room.  Left in w/c with all needs in reach.  Therapy Documentation Precautions:  Precautions Precautions: Fall Precaution Comments: L inattention Restrictions Weight Bearing Restrictions: No  Pain: Pt with no pain during session.    Locomotion : Ambulation Ambulation/Gait Assistance: 4: Min assist;3: Mod assist   See FIM for current functional status  Therapy/Group: Individual Therapy  [name] 02/03/2015, 4:54 PM

## 2015-02-03 NOTE — Progress Notes (Signed)
Occupational Therapy Session Note  Patient Details  Name: Diane Hutchinson MRN: [id number] Date of Birth: 1946/09/27  Today's Date: 02/03/2015 OT Individual Time: 0900-1000 and 1400-1500 OT Individual Time Calculation (min): 60 min and 60 min   Short Term Goals: Week 2:  OT Short Term Goal 1 (Week 2): Pt will perform shower transfer with Mod A squat pivot in order to decrease assistance with functional transfers. OT Short Term Goal 2 (Week 2): Pt will perform toileting with Min A for balance. OT Short Term Goal 3 (Week 2): Pt will independently donn and doff bath mit in shower during bathing tasks. OT Short Term Goal 4 (Week 2): Pt will perform UB dressing with supervision in order to increase I in self care.  Skilled Therapeutic Interventions/Progress Updates:  Session 1: Upon entering the room, pt seated in wheelchair awaiting therapist with no c/o pain. Pt requesting to wash at shower level this session. OT assisted pt into bathroom via wheelchair for shower transfer. Squat pivot from wheelchair onto shower seat with Mod A. Pt donning bath mitt independently onto L hand this session. Pt requiring Min verbal cues in order to use L UE for bathing tasks. STS with Mod A and use of grab bar in order to wash buttocks and peri area. Pt transferring back to wheelchair in same manner as stated above. Grooming and dressing performed at sink side. UB dressing with button up shirt and assistance to button shirt and thread L UE. Pt requiring mod verbal cues throughout session for self awareness of L: UE. Modified sock used as sleeve as an attempt to initiate more self awareness but also protecting skin. LB dressing with Mod A STS at sink side. Swedish knee cage and AFO donned on L LE this session. Pt seated in wheelchair with call bell within reach upon exiting the room.   Session 2: Upon entering the room, pt seated in wheelchair with no c/o pain. Pt very upset about conference today regarding  discharge date and about neuropsych visit. Pt very paranoid and stating, "I want to know who is talking about me and why the hell they think I am crazy and need a shrink." Pt verbalizing much profanity in conversation regarding this topic. OT educated pt on referral being made secondary to significant life change and increasing coping skills. OT also discussed reason for continued therapy and progress towards therapy goals. Pt verbalized understanding but reports still being upset because, "the doctor said I could go home sooner." Pt and OT discussed stroke risk factors as well as made list of activities to engage in when upset in order to relax. Pt stating she would rub or play with pets. Therapy dog in hallway enters room and pt did pet dog with L hand during session. Pt smiling and reporting she felt better after speaking to therapist about concerns and petting dog. OT added bright yellow coban dressing over sock sleeve on L UE in order to increase visual awareness of arm. Pt seated in wheelchair with call bell and all needed items within reach upon exiting the room.   Therapy Documentation Precautions:  Precautions Precautions: Fall Precaution Comments: L inattention Restrictions Weight Bearing Restrictions: No  Pain: Pain Assessment Pain Assessment: No/denies pain ADL: ADL ADL Comments: see FIM  See FIM for current functional status  Therapy/Group: Individual Therapy  Phineas Semen 02/03/2015, 11:09 AM

## 2015-02-03 NOTE — Progress Notes (Signed)
Social Work [name], LCSW Social Worker Signed  Patient Care Conference 02/03/2015  2:20 PM    Expand All Collapse All   Inpatient RehabilitationTeam Conference and Plan of Care Update Date: 02/03/2015   Time: 11;15 AM     Patient Name: WYNN KERNES       Medical Record Number: [id number]  Date of Birth: 12-31-1967 Sex: Female         Room/Bed: 4W07C/4W07C-01 Payor Info: Payor: MEDICARE / Plan: MEDICARE PART A AND B / Product Type: *No Product type* /    Admitting Diagnosis: RT CVA   Admit Date/Time:  01/22/2015  4:03 PM Admission Comments: No comment available   Primary Diagnosis:  <principal problem not specified> Principal Problem: <principal problem not specified>    Patient Active Problem List     Diagnosis  Date Noted   .  Alterations of sensations following CVA (cerebrovascular accident)     .  Embolic cerebral infarction  01/22/2015   .  Left hemiparesis  01/22/2015   .  Stroke     .  Cerebral embolism with cerebral infarction  01/21/2015   .  Ataxia     .  Brain aneurysm  01/20/2015   .  TOBACCO ABUSE  01/04/2010   .  BRONCHITIS, CHRONIC  01/04/2010   .  CERVICAL CANCER  01/03/2010   .  HYPERLIPIDEMIA  01/03/2010   .  DEPRESSION  01/03/2010   .  SUBARACHNOID HEMORRHAGE  01/03/2010   .  [name]  01/03/2010     Expected Discharge Date: Expected Discharge Date: 02/19/15  Team Members Present: Physician leading conference: Dr. Alysia Penna Social Worker Present: Ovidio Kin, LCSW Nurse Present: Heather Roberts, RN PT Present: Cameron Sprang, PT OT Present: Benay Pillow, OT PPS Coordinator present : Daiva Nakayama, RN, CRRN        Current Status/Progress  Goal  Weekly Team Focus   Medical     psych stable , severe sensory loss, transfers improving Mod A hemi walker , needs L AFO  home with supervision  orthotic management   Bowel/Bladder     continent of bowel and bladder. brief inplace due to urgancy   To continue continent to bladder and bowel  and manage min assist   cont. stool softeners and encourage BSC    Swallow/Nutrition/ Hydration       na         ADL's     min assist UB bathing and LB bathing sit to stand, UB dressing min-supervision, min to mod for LB dressing, mod assist for toilet transfers and toileting tasks. Still with ataxia LUE and LLE requring min to mod assist for basic selfcare tasks.  supervision overall; LB dressing-min A   selfcare re-training, transfer training, neuromuscular re-education, balance re-training, pt/family education   Mobility     Pt currently min A for bed mobility, min A squat pivot transfers, min/mod for sit<>stand and max A for dynamic standing to facilitate decreased compensations.   S overall (will likely downgrade to minA on next weekly note)   LUE/LE NMR, transfers, standing balance, gait, pt/family education.    Communication       na         Safety/Cognition/ Behavioral Observations      no unsafe behaviors         Pain     no complaints of pain         Skin     scattered  bruising. right groin foam inplace due tp incision with mild drainage  remain free of skin breakdown and free of infection   assess skin q shift      *See Care Plan and progress notes for long and short-term goals.    Barriers to Discharge:  poor awareness of left     Possible Resolutions to Barriers:   cont rehab     Discharge Planning/Teaching Needs:   Home with husband who has been here and observed in therapies, can provide 24 hr care       Team Discussion:    Making good progress-will need AFO for left leg prior to discharge. Brighter outlook. SP not following. Can get ambulatory if stays until 4/8. Husband to take FMLA to provide care to pt at discharge.   Revisions to Treatment Plan:    None    Continued Need for Acute Rehabilitation Level of Care: The patient requires daily medical management by a physician with specialized training in physical medicine and rehabilitation for the following  conditions: Daily direction of a multidisciplinary physical rehabilitation program to ensure safe treatment while eliciting the highest outcome that is of practical value to the patient.: Yes Daily medical management of patient stability for increased activity during participation in an intensive rehabilitation regime.: Yes Daily analysis of laboratory values and/or radiology reports with any subsequent need for medication adjustment of medical intervention for : Neurological problems;Other  Gearald Stonebraker, Gardiner Rhyme 02/03/2015, 2:20 PM                 [name], LCSW Social Worker Signed  Patient Care Conference 01/27/2015  1:56 PM    Expand All Collapse All   Inpatient RehabilitationTeam Conference and Plan of Care Update Date: 01/27/2015   Time: 11;30 AM     Patient Name: AMERIS AKAMINE       Medical Record Number: [id number]  Date of Birth: [birth year] Sex: Female         Room/Bed: 4W07C/4W07C-01 Payor Info: Payor: MEDICARE / Plan: MEDICARE PART A AND B / Product Type: *No Product type* /    Admitting Diagnosis: RT CVA   Admit Date/Time:  01/22/2015  4:03 PM Admission Comments: No comment available   Primary Diagnosis:  <principal problem not specified> Principal Problem: <principal problem not specified>    Patient Active Problem List     Diagnosis  Date Noted   .  Embolic cerebral infarction  01/22/2015   .  Left hemiparesis  01/22/2015   .  Stroke     .  Cerebral embolism with cerebral infarction  01/21/2015   .  Ataxia     .  Brain aneurysm  01/20/2015   .  TOBACCO ABUSE  01/04/2010   .  BRONCHITIS, CHRONIC  01/04/2010   .  CERVICAL CANCER  01/03/2010   .  HYPERLIPIDEMIA  01/03/2010   .  DEPRESSION  01/03/2010   .  SUBARACHNOID HEMORRHAGE  01/03/2010   .  [name]  01/03/2010     Expected Discharge Date: Expected Discharge Date: 02/19/15  Team Members Present: Physician leading conference: Dr. Alysia Penna Social Worker Present: Ovidio Kin,  LCSW Nurse Present: Heather Roberts, RN PT Present: Cameron Sprang, PT OT Present: Benay Pillow, OT PPS Coordinator present : Daiva Nakayama, RN, CRRN        Current Status/Progress  Goal  Weekly Team Focus   Medical     Severe sensory loss on left side, poor awareness of joint  position on left side, chronic anxiety and depression   Home discharge with medical stability   Increase awareness of left side   Bowel/Bladder     Continent to bowel and bladder.  To continue continent to bladder and bowel.   To monitor bladder and bowel function Q shift.    Swallow/Nutrition/ Hydration       NA         ADL's     bathing-mod A; UB dressing-min A; LB dressing-max A; functional transfers-max A; toileting-max A  supervision overall; LB dressing-min A   activity tolerance, functional transfers, standing balance, LUE NMR, BADLs   Mobility     Pt currently max A for bed mobility, max A for functional transfers, max A for standing with +2 needed for more dynamic tasks, +2 A for gait.  Pt extremely limited by decreased sensation and proprioception in LUE/LE.   S overall  LUE/LE NMR, transfers, standing balance, gait, pt/family education   Communication               Safety/Cognition/ Behavioral Observations       To keep pt. free from falls during her stay in rehab.   To keep hour rounding to assure pt. safety.    Pain     No complain of pain.  To keep pain levels less than 3,on scale 1 to 10.  To monitor pain levels Q 2 hrs. and PRN.   Skin     Pt. has several generalized bruises.   To keep skin free of pressure ulcers.   To monitor skin Q. shift and PRN      *See Care Plan and progress notes for long and short-term goals.    Barriers to Discharge:  See above     Possible Resolutions to Barriers:   See above     Discharge Planning/Teaching Needs:   Home with husband who will take a FMLA to provide care to pt at discharge       Team Discussion:    Ankle instability order for air cast for  support in therapies.  Self awareness poor.  Currently plus 2 assist-goals for supervision/min level. Husband to take FMLA to provide care to pt at home.    Revisions to Treatment Plan:    None    Continued Need for Acute Rehabilitation Level of Care: The patient requires daily medical management by a physician with specialized training in physical medicine and rehabilitation for the following conditions: Daily direction of a multidisciplinary physical rehabilitation program to ensure safe treatment while eliciting the highest outcome that is of practical value to the patient.: Yes Daily medical management of patient stability for increased activity during participation in an intensive rehabilitation regime.: Yes Daily analysis of laboratory values and/or radiology reports with any subsequent need for medication adjustment of medical intervention for : Neurological problems;Other  [name] 01/29/2015, 9:54 AM                  Patient ID: [name], female   DOB: 07-06-1946, 69 y.o.   MRN: [id number]

## 2015-02-04 ENCOUNTER — Inpatient Hospital Stay (HOSPITAL_COMMUNITY): Payer: Medicare Other | Admitting: Occupational Therapy

## 2015-02-04 ENCOUNTER — Inpatient Hospital Stay (HOSPITAL_COMMUNITY): Payer: Medicare Other | Admitting: Rehabilitation

## 2015-02-04 NOTE — Progress Notes (Signed)
Subjective/Complaints: No issues overnite, pt without pain Review of Systems - Negative except as above  Objective: Vital Signs: Blood pressure 139/57, pulse 66, temperature 98.3 F (36.8 C), temperature source Oral, resp. rate 18, height 5\' 11"  (1.803 m), weight 78.382 kg (172 lb 12.8 oz), SpO2 96 %. No results found. No results found for this or any previous visit (from the past 72 hour(s)).   HEENT: normal Cardio: RRR and no murmur Resp: CTA B/L and unlabored GI: BS positive and NT, ND Extremity:  Pulses positive and No Edema Skin:   Bruise multiple bruises LUE>LLE, do not appear to be new Neuro: Alert/Oriented with reasonable insight and awareness. Abnormal Sensory Absent LT and proprio LUE.LLE, Abnormal Motor 3/5 LUE, 4/5 LLE with poor motor control but without hyperreflexia and Abnormal FMC Ataxic/ dec FMC Musc/Skel:   Dorsum Left hand without pain during ROM Gen NAD   Assessment/Plan: 1. Functional deficits secondary to Right MCA infarct with left hemiparesis which require 3+ hours per day of interdisciplinary therapy in a comprehensive inpatient rehab setting. Physiatrist is providing close team supervision and 24 hour management of active medical problems listed below. Physiatrist and rehab team continue to assess barriers to discharge/monitor patient progress toward functional and medical goals.  FIM: FIM - Bathing Bathing Steps Patient Completed: Chest, Left Arm, Abdomen, Front perineal area, Buttocks, Right upper leg, Left upper leg Bathing: 3: Mod-Patient completes 5-7 21f 10 parts or 50-74%  FIM - Upper Body Dressing/Undressing Upper body dressing/undressing steps patient completed: Thread/unthread right sleeve of pullover shirt/dresss, Thread/unthread left sleeve of pullover shirt/dress, Put head through opening of pull over shirt/dress, Pull shirt over trunk Upper body dressing/undressing: 5: Set-up assist to: Obtain clothing/put away FIM - Lower Body  Dressing/Undressing Lower body dressing/undressing steps patient completed: Thread/unthread right pants leg, Thread/unthread left pants leg, Don/Doff right shoe Lower body dressing/undressing: 2: Max-Patient completed 25-49% of tasks  FIM - Toileting Toileting steps completed by patient: Performs perineal hygiene Toileting Assistive Devices: Grab bar or rail for support Toileting: 2: Max-Patient completed 1 of 3 steps  FIM - Radio producer Devices: Elevated toilet seat, Grab bars Toilet Transfers: 3-To toilet/BSC: Mod A (lift or lower assist), 3-From toilet/BSC: Mod A (lift or lower assist)  FIM - Control and instrumentation engineer Devices: Arm rests Bed/Chair Transfer: 3: Bed > Chair or W/C: Mod A (lift or lower assist), 4: Supine > Sit: Min A (steadying Pt. > 75%/lift 1 leg)  FIM - Locomotion: Wheelchair Distance: 100 Locomotion: Wheelchair: 0: Activity did not occur FIM - Locomotion: Ambulation Locomotion: Ambulation Assistive Devices: Environmental consultant - Hemi, Walker - Rolling, Orthosis (L hand splint, L allard AFO) Ambulation/Gait Assistance: 4: Min assist, 3: Mod assist Locomotion: Ambulation: 2: Travels 50 - 149 ft with moderate assistance (Pt: 50 - 74%)  Comprehension Comprehension Mode: Auditory Comprehension: 5-Understands complex 90% of the time/Cues < 10% of the time  Expression Expression Mode: Verbal Expression Assistive Devices: 6-Talk trach valve Expression: 5-Expresses complex 90% of the time/cues < 10% of the time  Social Interaction Social Interaction: 5-Interacts appropriately 90% of the time - Needs monitoring or encouragement for participation or interaction.  Problem Solving Problem Solving: 5-Solves complex 90% of the time/cues < 10% of the time  Memory Memory: 5-Requires cues to use assistive device  Medical Problem List and Plan: 1. Functional deficits secondary to Embolic right brain infarct after elective left MCA  embolization procedure 2.  DVT Prophylaxis/Anticoagulation: SQ Lovenox.monitor for any bleeding episodes 3.  Pain Management: tylenol as needed 4. Mood/depression/anxiety: Risperdal 0.5 mg QHS/Desyrel 150 mg QHS,Zoloft 100 mg daily,Xanax 1 mg TID as needed. Team to provide ego-support as well. 5. Neuropsych: This patient is capable of making decisions on her own behalf. 6. Skin/Wound Care: Routine skin checks 7. Fluids/Electrolytes/Nutrition: Strict I & O.Follow up labs 8.Hyperlipidemia.Lipitor/Lovaza 9.  Hypokalemia, supplement LOS (Days) 13 A FACE TO FACE EVALUATION WAS PERFORMED  Diane Hutchinson 02/04/2015, 8:42 AM

## 2015-02-04 NOTE — Progress Notes (Signed)
Physical Therapy Session Note  Patient Details  Name: Diane Hutchinson MRN: [id number] Date of Birth: 1946/10/27  Today's Date: 02/04/2015 PT Individual Time: 1500-1600 PT Individual Time Calculation (min): 60 min   Short Term Goals: Week 2:  PT Short Term Goal 1 (Week 2): Pt will perform bed mobility at S level with HOB flat and without rails with mod cues for technique PT Short Term Goal 2 (Week 2): Pt will perform functional transfers to the R and L with min A.  PT Short Term Goal 3 (Week 2): Pt will self propel w/c x 100' using R hemi technique at S level.  PT Short Term Goal 4 (Week 2): Pt will ambulate x 50' with max A (+2 for chair follow) with LRAD.   Skilled Therapeutic Interventions/Progress Updates:   Pt received sitting in w/c in room, agreeable to therapy session.  Assisted pt to/from therapy gym via w/c for time management and energy conservation.  Skilled session focused on dynamic standing task while utilizing LUE for pipe tree, functional gait in controlled and home environment to better simulate home and floor recovery and education on fall risk at home.  Performed standing x 2 reps of 10 mins while completing two pipe tree structures at min A level.  Requires light tactile cues at elbow to guide through needed ROM.  Pt very easily frustrated with task and is very impulsive when attempting to use LUE functionally.  Provided max verbal cues for slower speed with all tasks and to increase attention to LUE.  Progressed to two bouts of gait x 50' each in controlled and carpeted environment with RW, L AFO and L hand splint.  Continued cues for upright posture, intermittent attention to LUE placement, esp when sitting and standing, sequencing when turning with RW and facilitation for increased L glute activation during L stance.  Performed bed mobility at S level with min cues for safety and attention to L UE.  Ended session with floor recovery.  Educated that due to increased assist  needed at this time, she should call 911 as her and husband risk getting hurt if she were to fall at this point. Will continue to re-assess for safety of floor recovery at time of D/C.  Demonstrated how to perform floor transfer and assist with +2A (+2 mainly for monitoring LUE during transitions) with assist for lowering, transitioning from SL to quadruped to tall kneeling>half kneeling to sitting on mat.  Pt very fatigued at end of session.  Assisted back to room and transferred back to bed at min A level.  Pt left in bed with bed alarm set and all needs in reach.   Therapy Documentation Precautions:  Precautions Precautions: Fall Precaution Comments: L inattention Restrictions Weight Bearing Restrictions: No   Vital Signs: Therapy Vitals Temp: 98.2 F (36.8 C) Temp Source: Oral Pulse Rate: 66 Resp: 17 BP: (!) 151/65 mmHg Patient Position (if appropriate): Sitting Oxygen Therapy SpO2: 98 % O2 Device: Not Delivered Pain:Pt with min c/o pain in L hand following floor transfer.    Locomotion : Ambulation Ambulation/Gait Assistance: 4: Min assist;3: Mod assist  See FIM for current functional status  Therapy/Group: Individual Therapy  [name] 02/04/2015, 4:33 PM

## 2015-02-04 NOTE — Progress Notes (Signed)
Occupational Therapy Session Note  Patient Details  Name: Diane Hutchinson MRN: [id number] Date of Birth: 1946-10-28  Today's Date: 02/04/2015 OT Individual Time: 3818-2993 and  1400-1500 OT Individual Time Calculation (min): 60 min and 60 min   Short Term Goals: Week 2:  OT Short Term Goal 1 (Week 2): Pt will perform shower transfer with Mod A squat pivot in order to decrease assistance with functional transfers. OT Short Term Goal 2 (Week 2): Pt will perform toileting with Min A for balance. OT Short Term Goal 3 (Week 2): Pt will independently donn and doff bath mit in shower during bathing tasks. OT Short Term Goal 4 (Week 2): Pt will perform UB dressing with supervision in order to increase I in self care.  Skilled Therapeutic Interventions/Progress Updates:   Session 1:Upon entering the room, pt supine in bed with no c/o pain but upset that she was awakened so early. Pt required Min A supine> sit. Once seated on EOB, pt engaging in bathing and dressing. Pt had blood on depend from area actively bleeding since last night per pt report. RN notified and arrived to apply dressing. Pt transferring into wheelchair from bed with Mod A squat pivot. Ted hose and AFO donned via therapist. Grooming and UB dressing performed seated in wheelchair at sink side. Pt required max verbal cues for safety awareness with L UE during functional tasks and transfers. Navy sleeve with yellow coband attached to L UE for safety awareness. Pt seated in wheelchair with call bell and all needed items within reach.   Session 2: Upon entering the room, pt seated in wheelchair awaiting therapist arrival. Pt with L LE propped on bed and pt reporting removal of shoe and AFO secondary to pain. STS from wheelchair and ambulating with RW into bathroom for toileting with Mod A for balance. Pt having difficulty clearing L LE during gait and requiring verbal and tactile cues for to clear. Toilet transfer with Mod A onto  elevated toilet chair. Pt required Max A for toileting as she needed assistance for clothing management. Pt returning to wheelchair in same manner. OT assisted pt via wheelchair and total assist outside. Pt standing with RW and Min A and assist to attach L UE to walker. Therapist providing verbal cues and tactile cues for weight shifting onto L UE/LE while tossing bean bags onto target x 2 reps. OT assisted pt back to floor and engaged in hemiplegic technique to propel wheelchair ~ 50 feet towards room. Pt with decreased frustration tolerance and requiring assistance to keep from hitting L wall. Pt requiring max verbal cues for safety awareness with L UE during STS. Pt seated in wheelchair with call bell and all needed items within reach upon exiting the room.    Therapy Documentation Precautions:  Precautions Precautions: Fall Precaution Comments: L inattention Restrictions Weight Bearing Restrictions: No Vital Signs: Therapy Vitals Temp: 98.2 F (36.8 C) Temp Source: Oral Pulse Rate: 66 Resp: 17 BP: (!) 151/65 mmHg Patient Position (if appropriate): Sitting Oxygen Therapy SpO2: 98 % O2 Device: Not Delivered ADL: ADL ADL Comments: see FIM  See FIM for current functional status  Therapy/Group: Individual Therapy  Phineas Semen 02/04/2015, 4:10 PM

## 2015-02-05 ENCOUNTER — Inpatient Hospital Stay (HOSPITAL_COMMUNITY): Payer: Medicare Other | Admitting: Occupational Therapy

## 2015-02-05 ENCOUNTER — Inpatient Hospital Stay (HOSPITAL_COMMUNITY): Payer: Medicare Other | Admitting: Rehabilitation

## 2015-02-05 LAB — CREATININE, SERUM
Creatinine, Ser: 0.82 mg/dL (ref 0.50–1.10)
GFR calc non Af Amer: 72 mL/min — ABNORMAL LOW (ref >90)
GFR, EST AFRICAN AMERICAN: 83 mL/min — AB (ref >90)

## 2015-02-05 NOTE — Progress Notes (Signed)
Physical Therapy Weekly Progress Note  Patient Details  Name: Diane Hutchinson MRN: [id number] Date of Birth: [birth year]  Beginning of progress report period: January 29, 2015 End of progress report period: February 05, 2015   Patient has met 3 of 4 short term goals.  Pt making great gains towards all STG's and LTG's.  Note that she is now transferring with min A, therefore will upgrade transfer goals to S (bed to chair) and is ambulatory with RW at min to mod A level.  Continues to demonstrate poor frustration level and decreased proprioception and sensation in LUE/LE.   Patient continues to demonstrate the following deficits: decreased balance, decreased awareness, decreased safety and therefore will continue to benefit from skilled PT intervention to enhance overall performance with activity tolerance, balance, postural control, ability to compensate for deficits, functional use of  left upper extremity and left lower extremity, attention, awareness, coordination and knowledge of precautions.  Patient progressing toward long term goals..  Plan of care revisions: Upgraded transfer goals to S, downgraded gait goals to min A level. .  PT Short Term Goals Week 2:  PT Short Term Goal 1 (Week 2): Pt will perform bed mobility at S level with HOB flat and without rails with mod cues for technique PT Short Term Goal 1 - Progress (Week 2): Met PT Short Term Goal 2 (Week 2): Pt will perform functional transfers to the R and L with min A.  PT Short Term Goal 2 - Progress (Week 2): Met PT Short Term Goal 3 (Week 2): Pt will self propel w/c x 100' using R hemi technique at S level.  PT Short Term Goal 3 - Progress (Week 2): Progressing toward goal PT Short Term Goal 4 (Week 2): Pt will ambulate x 50' with max A (+2 for chair follow) with LRAD.  PT Short Term Goal 4 - Progress (Week 2): Met Week 3:  PT Short Term Goal 1 (Week 3): Continue to work towards LTG's due to pt progress.   Skilled Therapeutic  Interventions/Progress Updates:    See previous notes.   Therapy Documentation   [name] 02/05/2015, 12:55 PM

## 2015-02-05 NOTE — Progress Notes (Signed)
Occupational Therapy Weekly Progress Note  Patient Details  Name: Diane Hutchinson MRN: [id number] Date of Birth: [birth year]  Beginning of progress report period: January 29, 2015 End of progress report period: February 05, 2015  Today's Date: 02/05/2015 OT Individual Time: 0900-1000 and 1400-1500 OT Individual Time Calculation (min): 60 min and 60 min   Patient has met 4 of 4 short term goals. Pt making steady progress towards long term and short term goals this week. Pt's largest barrier has been low frustration tolerance especially with L UE NMR. Pt is self care task with Min - Mod A. Functional transfers at [place] A at this time and ambulation short distances with RW with Min - Mod A based on fatigue.   Patient continues to demonstrate the following deficits: decreased I in self care, decreased balance, decreased safety awareness, decreased L UE/LE AROM and strength, decreased coordination, and decreased frustration tolerance, decreased endurance  and therefore will continue to benefit from skilled OT intervention to enhance overall performance with BADL.  Patient progressing toward long term goals..  Plan of care revisions: Some LTGs downgraded based on patient progress and for safety at discharge. Goals reflect supervision - Min A level at this time. .  OT Short Term Goals Week 2:  OT Short Term Goal 1 (Week 2): Pt will perform shower transfer with Mod A squat pivot in order to decrease assistance with functional transfers. OT Short Term Goal 1 - Progress (Week 2): Met OT Short Term Goal 2 (Week 2): Pt will perform toileting with Min A for balance. OT Short Term Goal 2 - Progress (Week 2): Met OT Short Term Goal 3 (Week 2): Pt will independently donn and doff bath mit in shower during bathing tasks. OT Short Term Goal 3 - Progress (Week 2): Met OT Short Term Goal 4 (Week 2): Pt will perform UB dressing with supervision in order to increase I in self care. OT Short Term Goal 4 - Progress  (Week 2): Met Week 3:  OT Short Term Goal 1 (Week 3): STGs=LTGs  Skilled Therapeutic Interventions/Progress Updates:   Session 1: Upon entering the room, pt with no c/o pain this session. Pt declining shower this session and requesting to wash at sink at get dressed. Supine >sit with supervision and min verbal cues for technique from flat bed. Pt performing Min A squat pivot transfer into wheelchair from bed. Wheelchair at sink side for self care tasks. Pt able to activiliy wash R UE with L hand with increased time during session. Pt standing at sink with Min - Mod A for balance when washing buttocks and peri area.  Pt continues to require max verbal cues for safety awareness with L UE during all tasks and especially STS. Pt requiring assistance to donn B shoes, ted, and AFO brace. Pt seated in wheelchair with call bell within reach upon exiting the room.   Session 2: Upon entering the room, pt seated in wheelchair with no c/o pain this session. OT assisted pt to day room in wheelchair via total A for energy conservation. Pt engaging in bilateral coordination task of straight arm raises to shoulder level with use of medium unweighted beach ball. Pt utilizing words written on ball as visual cue to ensure task performed equally with B UEs. Pt able to complete 4 sets of 5 reps with this tasks. Reps greater than 5 pt unable to complete secondary to fatigue. Pt requiring verbal cues to complete task slowing in order to insure greater  control with task. Pt seated in wheelchair at table top and able to engage in palmer translation task with connect 4 pieces if pieces placed into palm of hand. Pt dropping 2/13 pieces from palm as working to finger tips and dropping back into box. Pt requiring verbal cues for rest breaks as frustration increased. Pt standing with Min A at table top while weight bearing through L UE/LE during connect 4 game at table top with therapist. Pt standing for ~ 7 minutes before requiring rest  break. OT assisted pt to room via wheelchair for toileting. Pt ambulating with RW and Mod A into bathroom. Assistance to unattachattach L UE from RW . Pt returning to bed with supervision for sit >supine. OT assisted pt with removal of clothing and donning of gown per pt request before leaving room.  Bed alarm activated and call bell within reach upon exiting the room.   Therapy Documentation Precautions:  Precautions Precautions: Fall Precaution Comments: L inattention Restrictions Weight Bearing Restrictions: No ADL: ADL ADL Comments: see FIM  See FIM for current functional status  Therapy/Group: Individual Therapy  Phineas Semen 02/05/2015, 12:32 PM

## 2015-02-05 NOTE — Progress Notes (Signed)
Physical Therapy Session Note  Patient Details  Name: Diane Hutchinson MRN: [id number] Date of Birth: [birth year]  Today's Date: 02/05/2015 PT Individual Time: 1030-1130 PT Individual Time Calculation (min): 60 min   Short Term Goals: Week 2:  PT Short Term Goal 1 (Week 2): Pt will perform bed mobility at S level with HOB flat and without rails with mod cues for technique PT Short Term Goal 2 (Week 2): Pt will perform functional transfers to the R and L with min A.  PT Short Term Goal 3 (Week 2): Pt will self propel w/c x 100' using R hemi technique at S level.  PT Short Term Goal 4 (Week 2): Pt will ambulate x 50' with max A (+2 for chair follow) with LRAD.   Skilled Therapeutic Interventions/Progress Updates:   Pt received sitting in w/c in room, agreeable to therapy session.  Pt self propelled x 60' using R hemi technique at S to min A level.  Marked improvement with technique with cues for scanning to the L to avoid obstacles.  Once in therapy gym, skilled session focused on gait training with RW, L hand orthosis, L AFO in controlled environment and avoiding obstacles and stepping over obstacles, stair negotiation, dynamic standing for increased WB through LLE, functional use of LUE.  Performed gait throughout session at min A level with continued cues for upright posture and increased stride length on LLE with facilitation for posture and increased forward translation of L hip over LLE during stance phase of gait.  Performed 4, 6" steps with R handrail in step to fashion with mod A and cues for increased attention to LLE, esp when descending with cues for hip extension.  Progressed to performing dynamic standing task without UE support while moving cones from one stack to another with LUE with increased cues for slower speed and to keep attention on LUE for the entire movement.  Performed stepping with RLE to 3" step without UE support with max A with PT stabilizing LLE for improved  alignment and WB.  Pt with increasing pain in L knee.  Discussed options for bracing and that Gerald Stabs from [facility] should be coming to a session soon to fit for her own brace.  Pt verbalized understanding.  Ended session with gait x 60' with RW as above in controlled environment to increase activity tolerance.  Pt did well but needs more mod A with increased fatigue for posture and advancing LLE accurately.  Pt assisted back to room and left in w/c with all needs in reach.   Therapy Documentation Precautions:  Precautions Precautions: Fall Precaution Comments: L inattention Restrictions Weight Bearing Restrictions: No  Pain: Pt with mild c/o pain at site of sx from stent placement.    Locomotion : Ambulation Ambulation/Gait Assistance: 4: Min assist;3: Mod assist   See FIM for current functional status  Therapy/Group: Individual Therapy  [name] 02/05/2015, 12:11 PM

## 2015-02-05 NOTE — Plan of Care (Signed)
Problem: RH PAIN MANAGEMENT Goal: RH STG PAIN MANAGED AT OR BELOW PT'S PAIN GOAL Pain managed at or below 3  Outcome: Progressing No c/o pain

## 2015-02-05 NOTE — Plan of Care (Signed)
Problem: RH Balance Goal: LTG Patient will maintain dynamic standing with ADLs (OT) LTG: Patient will maintain dynamic standing balance with assist during activities of daily living (OT)  Downgraded based on pt progress and safety for discharge  Problem: RH Toileting Goal: LTG Patient will perform toileting w/assist, cues/equip (OT) LTG: Patient will perform toiletiing (clothes management/hygiene) with assist, with/without cues using equipment (OT)  Based on pt progress and safety for discharge

## 2015-02-05 NOTE — Progress Notes (Signed)
Subjective/Complaints: Pt without breathing problems Denies pains Review of Systems - Negative except as above  Objective: Vital Signs: Blood pressure 163/72, pulse 60, temperature 98 F (36.7 C), temperature source Oral, resp. rate 16, height _0  (1.803 m), weight 78.744 kg (173 lb 9.6 oz), SpO2 92 %. No results found. Results for orders placed or performed during the hospital encounter of 01/22/15 (from the past 72 hour(s))  Creatinine, serum     Status: Abnormal   Collection Time: 02/05/15  6:01 AM  Result Value Ref Range   Creatinine, Ser 0.82 0.50 - 1.10 mg/dL   GFR calc non Af Amer 72 (L) >90 mL/min   GFR calc Af Amer 83 (L) >90 mL/min    Comment: (NOTE) The eGFR has been calculated using the CKD EPI equation. This calculation has not been validated in all clinical situations. eGFR's persistently <90 mL/min signify possible Chronic Kidney Disease.      HEENT: normal Cardio: RRR and no murmur Resp: CTA B/L and unlabored GI: BS positive and NT, ND Extremity:  Pulses positive and No Edema Skin:   Bruise multiple bruises LUE>LLE, do not appear to be new Neuro: Alert/Oriented with reasonable insight and awareness. Abnormal Sensory Absent LT and proprio LUE.LLE, Abnormal Motor 4/5 LUE, 4/5 LLE with poor motor control  Abnormal FMC Ataxic/ dec FMC Musc/Skel:   Dorsum Left hand without pain during ROM Gen NAD   Assessment/Plan: 1. Functional deficits secondary to Right MCA infarct with left hemiparesis which require 3+ hours per day of interdisciplinary therapy in a comprehensive inpatient rehab setting. Physiatrist is providing close team supervision and 24 hour management of active medical problems listed below. Physiatrist and rehab team continue to assess barriers to discharge/monitor patient progress toward functional and medical goals.  FIM: FIM - Bathing Bathing Steps Patient Completed: Chest, Left Arm, Abdomen, Front perineal area, Buttocks, Right upper leg,  Left upper leg Bathing: 3: Mod-Patient completes 5-7 [id number] parts or 50-74%  FIM - Upper Body Dressing/Undressing Upper body dressing/undressing steps patient completed: Thread/unthread right sleeve of pullover shirt/dresss, Thread/unthread left sleeve of pullover shirt/dress, Put head through opening of pull over shirt/dress, Pull shirt over trunk Upper body dressing/undressing: 5: Set-up assist to: Obtain clothing/put away FIM - Lower Body Dressing/Undressing Lower body dressing/undressing steps patient completed: Thread/unthread right pants leg, Thread/unthread left pants leg, Don/Doff right shoe Lower body dressing/undressing: 2: Max-Patient completed 25-49% of tasks  FIM - Toileting Toileting steps completed by patient: Performs perineal hygiene Toileting Assistive Devices: Grab bar or rail for support Toileting: 2: Max-Patient completed 1 of 3 steps  FIM - TRadio producerDevices: Elevated toilet seat, Grab bars Toilet Transfers: 3-To toilet/BSC: Mod A (lift or lower assist), 3-From toilet/BSC: Mod A (lift or lower assist)  FIM - BControl and instrumentation engineerDevices: Arm rests Bed/Chair Transfer: 5: Supine > Sit: Supervision (verbal cues/safety issues), 5: Sit > Supine: Supervision (verbal cues/safety issues)  FIM - Locomotion: Wheelchair Distance: 100 Locomotion: Wheelchair: 0: Activity did not occur FIM - Locomotion: Ambulation Locomotion: Ambulation Assistive Devices: WEnvironmental consultant- Hemi, WEnvironmental consultant- Rolling, Orthosis Ambulation/Gait Assistance: 4: Min assist, 3: Mod assist Locomotion: Ambulation: 2: Travels 50 - 149 ft with moderate assistance (Pt: 50 - 74%)  Comprehension Comprehension Mode: Auditory Comprehension: 5-Understands complex 90% of the time/Cues < 10% of the time  Expression Expression Mode: Verbal Expression Assistive Devices: 6-Talk trach valve Expression: 5-Expresses complex 90% of the time/cues < 10% of the  time  Social  Interaction Social Interaction: 5-Interacts appropriately 90% of the time - Needs monitoring or encouragement for participation or interaction.  Problem Solving Problem Solving: 5-Solves basic 90% of the time/requires cueing < 10% of the time  Memory Memory: 5-Requires cues to use assistive device  Medical Problem List and Plan: 1. 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  DVT Prophylaxis/Anticoagulation: SQ Lovenox.monitor for any bleeding episodes 3. Pain Management: tylenol as needed 4. Mood/depression/anxiety: Risperdal 0.5 mg QHS/Desyrel 150 mg QHS,Zoloft 100 mg daily,Xanax 1 mg TID as needed. Team to provide ego-support as well. 5. Neuropsych: This patient is capable of making decisions on her own behalf. 6. Skin/Wound Care: Routine skin checks 7. Fluids/Electrolytes/Nutrition: Strict I & O.Follow up labs 8.Hyperlipidemia.Lipitor/Lovaza 9.  Hypokalemia, supplement LOS (Days) 14 A FACE TO FACE EVALUATION WAS PERFORMED  Diane Hutchinson 02/05/2015, 8:21 AM

## 2015-02-06 ENCOUNTER — Inpatient Hospital Stay (HOSPITAL_COMMUNITY): Payer: Medicare Other | Admitting: Physical Therapy

## 2015-02-06 ENCOUNTER — Inpatient Hospital Stay (HOSPITAL_COMMUNITY): Payer: Medicare Other | Admitting: Occupational Therapy

## 2015-02-06 NOTE — Progress Notes (Signed)
Physical Therapy Session Note  Patient Details  Name: Diane Hutchinson MRN: [id number] Date of Birth: [birth year]  Today's Date: 02/06/2015 PT Individual Time: 0800-0900 PT Individual Time Calculation (min): 60 min   Short Term Goals: Week 3:  PT Short Term Goal 1 (Week 3): Continue to work towards LTG's due to pt progress.   Skilled Therapeutic Interventions/Progress Updates:    Therapeutic Activity: Pt received in hospital bed and so PT flattens bed and takes rails away and pt demonstrates supine to sit mod I. PT brings pt short gown, per pt request, and pt undresses from long gown and dons short gown with set-up assist on edge of bed. PT dons pt's knee high TED hose while pt maintains dynamic sit balance mod I. Pt dons her R shoe and L shoe with L AFO with set up assist; pt req cues to stabilize brace against shin with L hand, while velcroing with R hand.  PT instructs pt in transfers with RW, L HO, L AFO and armrest req min A bed to/from w/c and mod I sit to/from supine on mat.   Gait Training: PT instructs pt in ambulation on level tile surface with RW, L HO, and L AFO x 55' req mod A for balance and verbal cues for increased weight shift to the R to improve L foot clearance and for attention to the L hand to improve grip on HO.  PT instructs pt in ambulation on carpet surface with RW, L HO, and L AFO x 65' req min-mod A for balance (more stability of the RW on the carpet vs the tile) + 15' onto tile surface, for a total of 80' on 2nd bout - focus on weight shifting R in R stance for improved L foot clearance.   Neuromuscular Reeducation: PT instructs pt in L UE NMR in prone on elbows activity on mat: sustaining static position, progressing to rocking R/L x 10 reps, attempting to transition to quadruped (pt c/o cramp in L leg on first rep and req supine rest break) x 2 sets.   W/C Management: PT instructs pt in w/c propulsion with R UE/LE req min-mod A prn - pt has difficult time  coordination push of R arm with steering using a diagonal pull of L leg - PT instructs pt to initially use L leg only, which she is able to control w/c well when focusing on one extremity only, but once R arm is added to the equation, pt's focus and coordination breaks down. Pt propels x 100' at a time.    Pt req reqeated cues for attention to L side of body; pt's L wrist frequently gets caught in hyperflexed position when attention is focused elsewhere. Pt tolerated approximation/weight-bearing exercises through L side of body on mat well, but req rest breaks due to cramping pain. Continue per PT POC.   Therapy Documentation Precautions:  Precautions Precautions: Fall Precaution Comments: L inattention Restrictions Weight Bearing Restrictions: No Vital Signs: Therapy Vitals Temp: 97.9 F (36.6 C) Temp Source: Oral Pulse Rate: 88 Resp: 17 BP: 122/71 mmHg Patient Position (if appropriate): Sitting Oxygen Therapy SpO2: 94 % O2 Device: Not Delivered Pain: Pain Assessment Pain Assessment: 0-10 Pain Score: 5  Pain Type: Acute pain Pain Location: Calf Pain Orientation: Left Pain Descriptors / Indicators: Cramping Pain Onset: With Activity Pain Intervention(s): Rest Multiple Pain Sites: No  See FIM for current functional status  Therapy/Group: Individual Therapy  Suzana Sohail M 02/06/2015, 8:07 AM

## 2015-02-06 NOTE — Progress Notes (Signed)
Occupational Therapy Session Note  Patient Details  Name: AROUSH Hutchinson MRN: [id number] Date of Birth: 12-Mar-1946  Today's Date: 02/06/2015 OT Individual Time: 1100-1200 and 1330- 1340 OT Individual Time Calculation (min): 60 min and 10 min ( 35 missed minutes in afternoon session)   Short Term Goals: Week 3:  OT Short Term Goal 1 (Week 3): STGs=LTGs  Skilled Therapeutic Interventions/Progress Updates:  Session 1:Upon entering the room, pt seated in wheelchair with c/o "cramping" in stomach. RN alerted and arrived with medication. Pt standing with Mod A from wheelchair while RN inserted suppository. OT assisted pt via wheelchair with total A to day room for L NMR. However, once reaching day room pt reporting the urge to return to room for toileting. OT assised pt back to room. Mod A stand pivot onto elevated toilet seat for BM. Pt very upset and continuing to report cramps in stomach. Pt able to perform clothing management for toileting episode but required assist with hygiene this session. Pt returning to wheelchair in same manner and washing hands at sink. OT discussed pt progress towards goals with pt listing 3 things this week she did well and 3 additional things she feels needs to continue to address in order to see pt insight to deficits. Pt seated in wheelchair with call bell within reach upon exiting the room.   Session 2: Upon entering the room, pt seated in wheelchair wrapped up in multiple blankets. Pt reporting she feels unwell and continues to have stomach cramps. Pt declining toileting at this time as well as other therapeutic interventions and requests to return to bed. Min A stand pivot transfer from wheelchair >bed. Sit >supine with supervision. Call bell and all needed items within reach as well as bed alarm activated upon exiting the room. 35 missed minutes secondary to pt reporting she is ill and refusing session.   Therapy Documentation Precautions:   Precautions Precautions: Fall Precaution Comments: L inattention Restrictions Weight Bearing Restrictions: No Vital Signs: Therapy Vitals Pulse Rate: 88 BP: 122/71 mmHg Patient Position (if appropriate): Sitting Oxygen Therapy SpO2: 94 % O2 Device: Not Delivered ADL: ADL ADL Comments: see FIM  See FIM for current functional status  Therapy/Group: Individual Therapy  Phineas Semen 02/06/2015, 12:18 PM

## 2015-02-06 NOTE — Progress Notes (Signed)
Patient ID: Diane Hutchinson, female   DOB: September 13, 1946, 69 y.o.   MRN: [id number]   02/06/15.  Subjective/Complaints:  69 y/o admit for CIR with functional deficits secondary to Right MCA infarct with left hemiparesis Concerned about labile BP readings; still some minimal bleeding from R groin  Review of Systems - Negative except as above   Past Medical History  Diagnosis Date  . Migraine   . Depression   . Hypertension   . Stroke   . COPD (chronic obstructive pulmonary disease)   . Anxiety     h/o of panic attack  . GERD (gastroesophageal reflux disease)     occas. use of TUMS  . Arthritis     knees   Patient Vitals for the past 24 hrs:  BP Temp Temp src Pulse Resp SpO2  02/06/15 0537 (!) 155/68 mmHg 97.9 F (36.6 C) Oral 78 17 93 %  02/05/15 1703 (!) 143/72 mmHg 98.7 F (37.1 C) Oral 68 18 96 %      Intake/Output Summary (Last 24 hours) at 02/06/15 0823 Last data filed at 02/06/15 0800  Gross per 24 hour  Intake   1680 ml  Output    500 ml  Net   1180 ml     Objective: Vital Signs: Blood pressure 155/68, pulse 78, temperature 97.9 F (36.6 C), temperature source Oral, resp. rate 17, height 5' 11" (1.803 m), weight 173 lb 9.6 oz (78.744 kg), SpO2 93 %. No results found. Results for orders placed or performed during the hospital encounter of 01/22/15 (from the past 72 hour(s))  Creatinine, serum     Status: Abnormal   Collection Time: 02/05/15  6:01 AM  Result Value Ref Range   Creatinine, Ser 0.82 0.50 - 1.10 mg/dL   GFR calc non Af Amer 72 (L) >90 mL/min   GFR calc Af Amer 83 (L) >90 mL/min    Comment: (NOTE) The eGFR has been calculated using the CKD EPI equation. This calculation has not been validated in all clinical situations. eGFR's persistently <90 mL/min signify possible Chronic Kidney Disease.      HEENT: normal Cardio: RRR and no murmur Resp: CTA B/L and unlabored GI: BS positive and NT, ND Extremity:  Pulses positive and No  Edema Skin:   Bruise multiple bruises LUE>LLE, do not appear to be new Neuro: Alert/Oriented with reasonable insight and awareness. Abnormal Sensory Absent LT and proprio LUE.LLE, Abnormal Motor 4/5 LUE, 4/5 LLE with poor motor control   Musc/Skel:   Dorsum Left hand without pain during ROM Gen NAD   Assessment/Plan: 1. 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  DVT Prophylaxis/Anticoagulation: SQ Lovenox.monitor for any bleeding episodes 3. Pain Management: tylenol as needed 4. Mood/depression/anxiety: Risperdal 0.5 mg QHS/Desyrel 150 mg QHS,Zoloft 100 mg daily,Xanax 1 mg TID as needed. Team to provide ego-support as well. 5. Neuropsych: This patient is capable of making decisions on her own behalf. 6. Skin/Wound Care: Routine skin checks 7. Fluids/Electrolytes/Nutrition: Strict I & O.Follow up labs 8.Hyperlipidemia.Lipitor/Lovaza 9.  H/o HTN previously treated with metoprolol.  Patient aware of danger of hypoperfusion in setting of acute CVA  LOS (Days) 15 A FACE TO FACE EVALUATION WAS PERFORMED  Nyoka Cowden 02/06/2015, 8:21 AM

## 2015-02-07 ENCOUNTER — Inpatient Hospital Stay (HOSPITAL_COMMUNITY): Payer: Medicare Other

## 2015-02-07 NOTE — Progress Notes (Signed)
Physical Therapy Session Note  Patient Details  Name: Diane Hutchinson MRN: [id number] Date of Birth: Oct 08, 1946  Today's Date: 02/07/2015 PT Individual Time: 1545-1630 PT Individual Time Calculation (min): 45 min   Short Term Goals: Week 3:  PT Short Term Goal 1 (Week 3): Continue to work towards LTG's due to pt progress.   Skilled Therapeutic Interventions/Progress Updates:    Pt received seated in w/c, agreeable to participate in therapy. Session focused on gait training, functional use of L hand. Pt w/ multiple bouts of ambulation 50-70' at a time w/ MinA w/ RW, AFO, and L HO. Pt had intermittent LOB during turns and while navigating over threshold into rehab apartment w/ heavy MinA to correct. Pt return demonstrated education to stop every 2-3 steps and look down at L hand to adjust if necessary due to tendency for hand to slip off of orthosis putting wrist into significant extension. Instructed pt in donning pillow cases on pillows from seated position to address functional use of L hand, pt completed task with extra time mod (I). Pt w/ multiple sit<>stands from mat table and standard mattress w/ min cueing for hand placement and overall CGA. Session ended in pt's room, where pt was left seated in w/c w/ husband present w/ all needs within reach.    Therapy Documentation Precautions:  Precautions Precautions: Fall Precaution Comments: L inattention Restrictions Weight Bearing Restrictions: No Pain:   Complained of pins and needles in L wrist after ambulation, likely due to extension of wrist with hand slipping off HO. Also complained of muscle soreness in L leg at end of session, pt allowed to rest.  See FIM for current functional status  Therapy/Group: Individual Therapy  Rada Hay  Rada Hay, PT, DPT 02/07/2015, 7:34 AM

## 2015-02-07 NOTE — Progress Notes (Signed)
Patient ID: Diane Hutchinson, female   DOB: 1946-08-14, 69 y.o.   MRN: [id number]   Patient ID: Diane Hutchinson, female   DOB: 21-May-1946, 69 y.o.   MRN: [id number]   02/07/15.  Subjective/Complaints:  69 y/o admit for CIR with functional deficits secondary to Right MCA infarct with left hemiparesis Concerned about labile BP readings; still some minimal bleeding from R groin but now largely resolved.  Review of Systems - Negative except as above   Past Medical History  Diagnosis Date  . Migraine   . Depression   . Hypertension   . Stroke   . COPD (chronic obstructive pulmonary disease)   . Anxiety     h/o of panic attack  . GERD (gastroesophageal reflux disease)     occas. use of TUMS  . Arthritis     knees   Patient Vitals for the past 24 hrs:  BP Temp Temp src Pulse Resp SpO2  02/07/15 0545 (!) 141/50 mmHg 98 F (36.7 C) Oral 64 18 95 %  02/06/15 1438 (!) 135/57 mmHg 98.9 F (37.2 C) Oral 62 - 93 %  02/06/15 0842 122/71 mmHg - - 88 - 94 %      Intake/Output Summary (Last 24 hours) at 02/07/15 0758 Last data filed at 02/06/15 2219  Gross per 24 hour  Intake   1200 ml  Output    250 ml  Net    950 ml     Objective: Vital Signs: Blood pressure 141/50, pulse 64, temperature 98 F (36.7 C), temperature source Oral, resp. rate 18, height '5\' 11"'  (1.803 m), weight 173 lb 9.6 oz (78.744 kg), SpO2 95 %. No results found. Results for orders placed or performed during the hospital encounter of 01/22/15 (from the past 72 hour(s))  Creatinine, serum     Status: Abnormal   Collection Time: 02/05/15  6:01 AM  Result Value Ref Range   Creatinine, Ser 0.82 0.50 - 1.10 mg/dL   GFR calc non Af Amer 72 (L) >90 mL/min   GFR calc Af Amer 83 (L) >90 mL/min    Comment: (NOTE) The eGFR has been calculated using the CKD EPI equation. This calculation has not been validated in all clinical situations. eGFR's persistently <90 mL/min signify possible Chronic  Kidney Disease.      HEENT: normal Cardio: RRR and no murmur Resp: CTA B/L and unlabored GI: BS positive and NT, ND Extremity:  Pulses positive and No Edema Skin:   Bruise multiple bruises LUE>LLE, do not appear to be new Neuro: Alert/Oriented with reasonable insight and awareness. Abnormal Sensory Absent LT and proprio LUE.LLE, Abnormal Motor 4/5 LUE, 4/5 LLE with poor motor control   Musc/Skel:   Dorsum Left hand without pain during ROM Gen NAD   Assessment/Plan: 1. 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  DVT Prophylaxis/Anticoagulation: SQ Lovenox.monitor for any bleeding episodes 3. Pain Management: tylenol as needed 4. Mood/depression/anxiety: Risperdal 0.5 mg QHS/Desyrel 150 mg QHS,Zoloft 100 mg daily,Xanax 1 mg TID as needed. Team to provide ego-support as well. 5. Neuropsych: This patient is capable of making decisions on her own behalf. 6. Skin/Wound Care: Routine skin checks 7. Fluids/Electrolytes/Nutrition: Strict I & O.Follow up labs 8.Hyperlipidemia.Lipitor/Lovaza 9.  H/o HTN previously treated with metoprolol.  Patient aware of danger of hypoperfusion in setting of acute CVA  LOS (Days) 16 A FACE TO FACE EVALUATION WAS PERFORMED  Nyoka Cowden 02/07/2015, 7:58 AM

## 2015-02-08 ENCOUNTER — Encounter (HOSPITAL_COMMUNITY): Payer: Medicare Other | Admitting: Rehabilitation

## 2015-02-08 ENCOUNTER — Inpatient Hospital Stay (HOSPITAL_COMMUNITY): Payer: Medicare Other

## 2015-02-08 MED ORDER — HYDROCORTISONE 1 % EX CREA
TOPICAL_CREAM | CUTANEOUS | Status: DC | PRN
  Administered 2015-02-08 – 2015-02-10 (×2): via TOPICAL
  Filled 2015-02-08: qty 28

## 2015-02-08 NOTE — Progress Notes (Signed)
Recreational Therapy Session Note  Patient Details  Name: Diane Hutchinson MRN: [id number] Date of Birth: Aug 13, 1946 Today's Date: 02/08/2015  Pain: no c/o Skilled Therapeutic Interventions/Progress Updates: Pt participated in lunch outing to [employer] at min-mod assist ambulatory level using RW, mod cues for safety & LUE safety/use.  Goals included safe mobility on even & uneven surfaces, identifying and negotiating obstacles, safety awareness, & attention to LUE.  See goal sheet in shadow chart for full details.  Therapy/Group: [employer]   Chikita Dogan 02/08/2015, 3:05 PM

## 2015-02-08 NOTE — Progress Notes (Signed)
[facility] Progress Note Patient Details:  Diane Hutchinson July 02, 1946 [id number]  Patient ID: Diane Hutchinson, female   DOB: 12-Apr-1946, 69 y.o.   MRN: [id number] Called in advanced brace order; spoke with Diane Hutchinson, Diane Hutchinson 02/08/2015, 10:47 AM

## 2015-02-08 NOTE — Progress Notes (Addendum)
Physical Therapy Session Note  Patient Details  Name: Diane Hutchinson MRN: [id number] Date of Birth: 09-Jul-1946  Today's Date: 02/08/2015 PT Individual Time: 1100-1300  Duration: 120 mins   Short Term Goals: Week 3:  PT Short Term Goal 1 (Week 3): Continue to work towards LTG's due to pt progress.   Skilled Therapeutic Interventions/Progress Updates:   Pt received sitting in w/c in room, agreeable to outing with PT and RT.  Skilled session focused on community integration with gait outdoors with RW, L HO, and L AFO at min to mod A level.  Continued cues for safety, sequencing and placement of LUE/LE during gait, functional transfers, utilization of LUE in functional tasks, functional mobility in busy environment, discussion of safety in the community, at home, and problem solving ways to avoid obstacles in the community.  See outing sheet in chart for further details on mobility.  Once pt back in room, provided max education regarding concerns for pt going home earlier than D/C date.  She is still very unaware and un-engaged in getting herself better in order for safe D/C.  Provided several examples at this time and importance of having husband come in on multiple days for extensive education.  Pt looking away from therapist and would only state "I just want to go home."  Will have discussion with OT tomorrow and determine if we can safely D/C earlier if family education performed.  Pt verbalized understanding.    Therapy Documentation Precautions:  Precautions Precautions: Fall Precaution Comments: L inattention Restrictions Weight Bearing Restrictions: No  Pain: Pt with no c/o pain during session.    Locomotion : Ambulation Ambulation/Gait Assistance: 4: Min assist;3: Mod assist   See FIM for current functional status  Therapy/Group: Individual Therapy (co-treat with RT)  Anthoni Geerts, Betha Loa 02/08/2015, 1:52 PM

## 2015-02-08 NOTE — Plan of Care (Signed)
Problem: RH Ambulation Goal: LTG Patient will ambulate in controlled environment (PT) LTG: Patient will ambulate in a controlled environment, # of feet with assistance (PT).  Downgraded due to safety concerns.  Goal: LTG Patient will ambulate in home environment (PT) LTG: Patient will ambulate in home environment, # of feet with assistance (PT).  Downgraded due to safety concerns.   Problem: RH Wheelchair Mobility Goal: LTG Patient will propel w/c in home environment (PT) LTG: Patient will propel wheelchair in home environment, # of feet with assistance (PT).  Outcome: Not Applicable Date Met:  34/14/43 Will D/C home w/c propulsion goal as she will be ambulatory in the home.

## 2015-02-08 NOTE — Progress Notes (Signed)
Subjective/Complaints: Had a good weekend. Anxious to get home. Wondered if dc date could be moved up Review of Systems - Negative except as above  Objective: Vital Signs: Blood pressure 148/57, pulse 67, temperature 98.3 F (36.8 C), temperature source Oral, resp. rate 18, height 5\' 11"  (1.803 m), weight 78.744 kg (173 lb 9.6 oz), SpO2 94 %. No results found. No results found for this or any previous visit (from the past 72 hour(s)).   HEENT: normal Cardio: RRR and no murmur Resp: CTA B/L and unlabored GI: BS positive and NT, ND Extremity:  Pulses positive and No Edema Skin:   Bruise multiple bruises LUE>LLE, do not appear to be new Neuro: Alert/Oriented with reasonable insight and awareness. Abnormal Sensory Absent LT and proprio LUE.LLE, Abnormal Motor 4/5 LUE, 4/5 LLE with poor motor control  Abnormal FMC Ataxic/ dec FMC Musc/Skel:   Dorsum Left hand without pain during ROM Gen NAD   Assessment/Plan: 1. Functional deficits secondary to Right MCA infarct with left hemiparesis which require 3+ hours per day of interdisciplinary therapy in a comprehensive inpatient rehab setting. Physiatrist is providing close team supervision and 24 hour management of active medical problems listed below. Physiatrist and rehab team continue to assess barriers to discharge/monitor patient progress toward functional and medical goals.  FIM: FIM - Bathing Bathing Steps Patient Completed: Chest, Left Arm, Abdomen, Front perineal area, Buttocks, Right upper leg, Left upper leg, Right Arm, Right lower leg (including foot) Bathing: 4: Min-Patient completes 8-9 32f 10 parts or 75+ percent  FIM - Upper Body Dressing/Undressing Upper body dressing/undressing steps patient completed: Thread/unthread right sleeve of pullover shirt/dresss, Thread/unthread left sleeve of pullover shirt/dress, Put head through opening of pull over shirt/dress, Pull shirt over trunk Upper body dressing/undressing: 5: Set-up  assist to: Obtain clothing/put away FIM - Lower Body Dressing/Undressing Lower body dressing/undressing steps patient completed: Thread/unthread right pants leg, Thread/unthread left pants leg, Don/Doff right shoe, Thread/unthread right underwear leg, Thread/unthread left underwear leg Lower body dressing/undressing: 3: Mod-Patient completed 50-74% of tasks  FIM - Toileting Toileting steps completed by patient: Performs perineal hygiene, Adjust clothing after toileting Toileting Assistive Devices: Grab bar or rail for support Toileting: 3: Mod-Patient completed 2 of 3 steps  FIM - Radio producer Devices: Elevated toilet seat, Grab bars Toilet Transfers: 3-To toilet/BSC: Mod A (lift or lower assist), 3-From toilet/BSC: Mod A (lift or lower assist)  FIM - Control and instrumentation engineer Devices: Walker, Orthosis, Arm rests Bed/Chair Transfer: 6: Sit > Supine: No assist, 6: Supine > Sit: No assist, 4: Bed > Chair or W/C: Min A (steadying Pt. > 75%), 4: Chair or W/C > Bed: Min A (steadying Pt. > 75%)  FIM - Locomotion: Wheelchair Distance: 100 Locomotion: Wheelchair: 1: Total Assistance/staff pushes wheelchair (Pt<25%) FIM - Locomotion: Ambulation Locomotion: Ambulation Assistive Devices: Orthosis, Administrator Ambulation/Gait Assistance: 4: Min assist Locomotion: Ambulation: 2: Travels 50 - 149 ft with minimal assistance (Pt.>75%)  Comprehension Comprehension Mode: Auditory Comprehension: 5-Understands basic 90% of the time/requires cueing < 10% of the time  Expression Expression Mode: Verbal Expression Assistive Devices: 6-Talk trach valve Expression: 5-Expresses complex 90% of the time/cues < 10% of the time  Social Interaction Social Interaction: 5-Interacts appropriately 90% of the time - Needs monitoring or encouragement for participation or interaction.  Problem Solving Problem Solving: 5-Solves basic 90% of the time/requires  cueing < 10% of the time  Memory Memory: 5-Recognizes or recalls 90% of the time/requires cueing <  10% of the time  Medical Problem List and Plan: 1. 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  DVT Prophylaxis/Anticoagulation: SQ Lovenox.monitor for any bleeding episodes 3. Pain Management: tylenol as needed 4. Mood/depression/anxiety: Risperdal 0.5 mg QHS/Desyrel 150 mg QHS,Zoloft 100 mg daily,Xanax 1 mg TID as needed. Team to provide ego-support as well.  5. Neuropsych: This patient is capable of making decisions on her own behalf. 6. Skin/Wound Care: continue observation, appropriate nutrition 7. Fluids/Electrolytes/Nutrition: encourage po 8.Hyperlipidemia.Lipitor/Lovaza 9.  Hypokalemia, supplement   LOS (Days) 17 A FACE TO FACE EVALUATION WAS PERFORMED  Diane Hutchinson T 02/08/2015, 8:31 AM

## 2015-02-08 NOTE — Progress Notes (Signed)
Occupational Therapy Session Note  Patient Details  Name: Diane Hutchinson MRN: [id number] Date of Birth: 03-04-1946  Today's Date: 02/08/2015 OT Individual Time: 1000-1100 OT Individual Time Calculation (min): 60 min    Short Term Goals: Week 3:  OT Short Term Goal 1 (Week 3): STGs=LTGs  Skilled Therapeutic Interventions/Progress Updates:    Pt resting in w/c upon arrival.  Nursing had already completed bathing and dressing tasks secondary to patient's request that a female not assist with these tasks.  Pt initially engaged in standing tasks with emphasis on weight bearing through LLE and ULE.  Pt required mod verbal cues to shift weight to LLE and LUE as patient prefers to bear weight through RLE.  Pt transitioned to BUE tasks with focus on strengthening and controlled movements.  Pt's RUE continue to exhibit ataxic movements although patient is able to grasp and release objects in Rt hand volitionally.   Therapy Documentation Precautions:  Precautions Precautions: Fall Precaution Comments: L inattention Restrictions Weight Bearing Restrictions: No Pain: Pain Assessment Pain Assessment: No/denies pain ADL: ADL ADL Comments: see FIM  See FIM for current functional status  Therapy/Group: Individual Therapy  Leroy Libman 02/08/2015, 11:00 AM

## 2015-02-09 ENCOUNTER — Inpatient Hospital Stay (HOSPITAL_COMMUNITY): Payer: Medicare Other | Admitting: Occupational Therapy

## 2015-02-09 ENCOUNTER — Inpatient Hospital Stay (HOSPITAL_COMMUNITY): Payer: Medicare Other | Admitting: Rehabilitation

## 2015-02-09 NOTE — Progress Notes (Signed)
Occupational Therapy Session Note  Patient Details  Name: Diane Hutchinson MRN: [id number] Date of Birth: 03/26/1946  Today's Date: 02/09/2015 OT Individual Time: 4098-1191 OT Individual Time Calculation (min): 60 min    Short Term Goals: Week 3:  OT Short Term Goal 1 (Week 3): STGs=LTGs  Skilled Therapeutic Interventions/Progress Updates:  Upon entering the room, pt supine in bed with no c/o pain. Pt very upset and wishing to discuss outing yesterday with therapist. OT discussed pts progress towards OT goals and her continued need for reinforcement for safety awareness. Supine >sit with supervision from flat bed. Stand pivot from bed >wheelchair with min A pt requiring min verbal cues for L hand placement for safety. Pt transferring into and out of shower chair with Mod A stand pivot and use of grab bars from wheelchair. Bathing at shower level with assist to donn and doff bath mit onto L hand. Pt utilizing L UE at diminished level for bathing tasks. Dressing while seated in wheelchair at sink side. STS with Min A from wheelchair and Min - Mod A (pt leaning posteriorly) balance during clothing management. Therapist assisted with set up of tray before exiting the room. Call bell and all needed items within reach.   Therapy Documentation Precautions:  Precautions Precautions: Fall Precaution Comments: L inattention Restrictions Weight Bearing Restrictions: No Vital Signs: Therapy Vitals Temp: 98.5 F (36.9 C) Temp Source: Oral Pulse Rate: 65 Resp: 18 BP: (!) 142/50 mmHg Patient Position (if appropriate): Lying Oxygen Therapy SpO2: 95 % O2 Device: Not Delivered ADL: ADL ADL Comments: see FIM  See FIM for current functional status  Therapy/Group: Individual Therapy  Phineas Semen 02/09/2015, 7:48 AM

## 2015-02-09 NOTE — Progress Notes (Signed)
Subjective/Complaints: No new complaints. Outing went without issues yesterday. Review of Systems - Negative except as above  Objective: Vital Signs: Blood pressure 142/50, pulse 65, temperature 98.5 F (36.9 C), temperature source Oral, resp. rate 18, height 5\' 11"  (1.803 m), weight 78.744 kg (173 lb 9.6 oz), SpO2 95 %. No results found. No results found for this or any previous visit (from the past 72 hour(s)).   HEENT: normal Cardio: RRR and no murmur Resp: CTA B/L and unlabored GI: BS positive and NT, ND Extremity:  Pulses positive and No Edema Skin:   Bruise multiple bruises LUE>LLE improving Neuro: Alert/Oriented with reasonable insight and awareness. Abnormal Sensory Absent LT and proprio LUE.LLE, Abnormal Motor 4/5 LUE, 4/5 LLE with poor motor control  Abnormal FMC Ataxic/ dec FMC Musc/Skel:    Left hand without pain during ROM Gen NAD   Assessment/Plan: 1. Functional deficits secondary to Right MCA infarct with left hemiparesis which require 3+ hours per day of interdisciplinary therapy in a comprehensive inpatient rehab setting. Physiatrist is providing close team supervision and 24 hour management of active medical problems listed below. Physiatrist and rehab team continue to assess barriers to discharge/monitor patient progress toward functional and medical goals.  FIM: FIM - Bathing Bathing Steps Patient Completed: Chest, Left Arm, Abdomen, Front perineal area, Buttocks, Right upper leg, Left upper leg, Right Arm, Right lower leg (including foot) Bathing: 4: Min-Patient completes 8-9 57f 10 parts or 75+ percent  FIM - Upper Body Dressing/Undressing Upper body dressing/undressing steps patient completed: Thread/unthread right sleeve of pullover shirt/dresss, Thread/unthread left sleeve of pullover shirt/dress, Put head through opening of pull over shirt/dress, Pull shirt over trunk Upper body dressing/undressing: 5: Set-up assist to: Obtain clothing/put away FIM -  Lower Body Dressing/Undressing Lower body dressing/undressing steps patient completed: Thread/unthread right pants leg, Thread/unthread left pants leg, Don/Doff right shoe, Thread/unthread right underwear leg, Thread/unthread left underwear leg, Pull pants up/down Lower body dressing/undressing: 3: Mod-Patient completed 50-74% of tasks  FIM - Toileting Toileting steps completed by patient: Performs perineal hygiene, Adjust clothing prior to toileting Toileting Assistive Devices: Grab bar or rail for support Toileting: 3: Mod-Patient completed 2 of 3 steps  FIM - Radio producer Devices: Elevated toilet seat, Grab bars Toilet Transfers: 3-To toilet/BSC: Mod A (lift or lower assist), 3-From toilet/BSC: Mod A (lift or lower assist)  FIM - Control and instrumentation engineer Devices: Orthosis, Arm rests Bed/Chair Transfer: 4: Bed > Chair or W/C: Min A (steadying Pt. > 75%), 4: Chair or W/C > Bed: Min A (steadying Pt. > 75%)  FIM - Locomotion: Wheelchair Distance: 100 Locomotion: Wheelchair: 1: Total Assistance/staff pushes wheelchair (Pt<25%) FIM - Locomotion: Ambulation Locomotion: Ambulation Assistive Devices: Orthosis, Administrator Ambulation/Gait Assistance: 4: Min assist, 3: Mod assist Locomotion: Ambulation: 1: Travels less than 50 ft with moderate assistance (Pt: 50 - 74%) (in community)  Comprehension Comprehension Mode: Auditory Comprehension: 5-Understands complex 90% of the time/Cues < 10% of the time  Expression Expression Mode: Verbal Expression Assistive Devices: 6-Talk trach valve Expression: 5-Expresses complex 90% of the time/cues < 10% of the time  Social Interaction Social Interaction: 5-Interacts appropriately 90% of the time - Needs monitoring or encouragement for participation or interaction.  Problem Solving Problem Solving: 5-Solves basic problems: With no assist  Memory Memory: 5-Recognizes or recalls 90% of the  time/requires cueing < 10% of the time  Medical Problem List and Plan: 1. Functional deficits secondary to Embolic right brain infarct after elective  left MCA embolization procedure 2.  DVT Prophylaxis/Anticoagulation: SQ Lovenox.monitor for any bleeding episodes 3. Pain Management: tylenol as needed 4. Mood/depression/anxiety: Risperdal 0.5 mg QHS/Desyrel 150 mg QHS,Zoloft 100 mg daily,Xanax 1 mg TID as needed. Team to provide ego-support as well.  5. Neuropsych: This patient is capable of making decisions on her own behalf. 6. Skin/Wound Care: continue observation, appropriate nutrition 7. Fluids/Electrolytes/Nutrition: encourage po 8.Hyperlipidemia.Lipitor/Lovaza 9.  Hypokalemia, supplement   LOS (Days) 18 A FACE TO FACE EVALUATION WAS PERFORMED  Diane Hutchinson T 02/09/2015, 8:39 AM

## 2015-02-09 NOTE — Progress Notes (Signed)
Recreational Therapy Session Note  Patient Details  Name: Diane Hutchinson MRN: [id number] Date of Birth: 08/04/1946 Today's Date: 02/09/2015  Pain: no c/o Skilled Therapeutic Interventions/Progress Updates: Session focused on activity tolerance, dynamic standing balance, attending to LUE, ambulation and safety awareness.  Pt stood with min to mod assist while using the RLE to kick a soccer ball. Pt easily frustrated throughout session needing emotional support/encouragement.  Pt ambulated using RW with min assist and mod demonstrational cueing for use of the RW correctly. Transitioned into ambulation with HHA with overall mod assist.  Pt stating she is ready to go home and is tired of being here.  Discussed the need for family education with her husband prior to discharge, pt stated understanding.  Therapy/Group: Co-Treatment  [name] 02/09/2015, 3:54 PM

## 2015-02-09 NOTE — Progress Notes (Signed)
Physical Therapy Session Note  Patient Details  Name: Diane Hutchinson MRN: [id number] Date of Birth: [birth year]  Today's Date: 02/09/2015 PT Individual Time: 1500-1530 PT Individual Time Calculation (min): 30 min   Short Term Goals: Week 3:  PT Short Term Goal 1 (Week 3): Continue to work towards LTG's due to pt progress.   Skilled Therapeutic Interventions/Progress Updates:   Pt received sitting in w/c in room, agreeable to therapy session.  PT and CSW discussed with pt that she can D/C on Friday and husband will need to come in at least two days in order to perform hands on family training.  CSW then called husband and he is to be in tomorrow and Thursday.  Pt pleased with this information.  Skilled session focused on w/c propulsion with BLEs and RUE for increased coordination and NMR through LLE.  Performed x 60' with min A.  Pt with decreased frustration tolerance, esp when cued to attend to LUE/LE, however hand would be near wheel and L LE would be under w/c.  Diane Hutchinson from [place] present during session to observe gait and determine best AFO upon D/C.  Diane Hutchinson feels that blue rocker AFO is more appropriate than allard as it will assist with decreased L knee hyperextension more than allard, will add a small heel wedge and will also add toe cap for increased clearance.  Pt agreeable and ambulated x 60' with RW, L HO, L AFO with min A and continued cues for attention to LUE, safety with RW and stepping sequence.  Pt assisted back to room and left in w/c with all needs in reach.   Therapy Documentation Precautions:  Precautions Precautions: Fall Precaution Comments: L inattention Restrictions Weight Bearing Restrictions: No   Vital Signs: Therapy Vitals Temp: 99.3 F (37.4 C) Temp Source: Oral Pulse Rate: 80 Resp: 18 BP: (!) 151/75 mmHg Patient Position (if appropriate): Sitting Oxygen Therapy SpO2: 94 % O2 Device: Not Delivered Pain: Pt with no report  of pain during session.    Locomotion : Ambulation Ambulation/Gait Assistance: 4: Min Financial controller Distance: 60   See FIM for current functional status  Therapy/Group: Individual Therapy  [name] 02/09/2015, 4:48 PM

## 2015-02-09 NOTE — Progress Notes (Signed)
Occupational Therapy Session Note  Patient Details  Name: Diane Hutchinson MRN: [id number] Date of Birth: [birth year]  Today's Date: 02/09/2015 OT Individual Time: 0830-1000 OT Individual Time Calculation (min): 90 min   Skilled Therapeutic Interventions/Progress Updates:    Began session by having pt attempt to propel the wheelchair down to the therapy gym.  Pt needing max assist to complete with use of the RUE and RLE.  Attempted use of the LUE but pt with decreased sensation throughout so felt safer letting her position in her lap.  In the gym focused session on use of the LUE.  Began using washcloth under the left hand and pushing it up and down a slanted board with emphasis on going slow and maintaining visual contact.  Pt also given mod demonstrational cueing for maintaining upright posture and anterior pelvic tilt.  Progressed to holding hand on ball as well while therapist tilted the board as well as having pt perform small, slow, controlled movements with the ball.  Pt still with decreased smoothness of movement and loses hand on the ball if visual attention is lost.  Second part of session involved co-tx with TR with focus on standing, sit to stand transitions, and functional mobility.  Worked on standing with min to mod assist while using the RLE to kick a soccer ball.  Pt needed UE stability with the RUE most of the time as she would demonstrate increased trunk and hip flexion when attempting to kick the ball without it requiring mod facilitation from therapist to maintain standing balance.  Provided single point walking stick for support in the right hand while performing task with overall mod assist to maintain balance.  Progressed to using walker for mobility with pt needing min assist and mod demonstrational cueing for use of the RW correctly.  At times the LLE still scissors and she reports inconsistent pain in the left hand, even though it is positioned safely on the walker splint.   Therapist also provided hand over hand to keep it secure as well.  Finished session with ambulation with therapist on each side.  Pt with decreased pushing through the RUE for support during 1/2 of mobility with mod assist of 2 overall.  Fatigued 1/2 way from elevators to her room requiring use of a wheelchair.  Pt with decreased efficiency with advancing the LLE during mobility one she fatigued.    Therapy Documentation Precautions:  Precautions Precautions: Fall Precaution Comments: L inattention Restrictions Weight Bearing Restrictions: No  Pain: Pain Assessment Pain Assessment: No/denies pain ADL: ADL ADL Comments: see FIM  See FIM for current functional status  Therapy/Group: Individual Therapy  Bayley Hurn OTR/L 02/09/2015, 12:24 PM

## 2015-02-09 NOTE — Progress Notes (Signed)
Social Work Patient ID: Diane Hutchinson, female   DOB: [birth year], 69 y.o.   MRN: [id number] Spoke with Emily-PT to discuss family education and pt wanting to go home soon.  Raquel Sarna and Katie feel if husband can come in [place] and Thurs for education then Can move up discharge to possibly Friday.  Pt is very happy about this and wants to go home.  Contacted husband to discuss plan and schedule him for education starting  Tomorrow and Thursday.  He will be here tomorrow at 8;00 am to begin.  Asked if this worker needs to contact his boss and he reports he will handle him.  Will see tomorrow how  education goes with husband.

## 2015-02-10 ENCOUNTER — Inpatient Hospital Stay (HOSPITAL_COMMUNITY): Payer: Medicare Other | Admitting: Physical Therapy

## 2015-02-10 ENCOUNTER — Inpatient Hospital Stay (HOSPITAL_COMMUNITY): Payer: Medicare Other | Admitting: Rehabilitation

## 2015-02-10 ENCOUNTER — Inpatient Hospital Stay (HOSPITAL_COMMUNITY): Payer: Medicare Other | Admitting: Occupational Therapy

## 2015-02-10 NOTE — Progress Notes (Signed)
Physical Therapy Session Note  Patient Details  Name: Diane Hutchinson MRN: [id number] Date of Birth: 06/15/1946  Today's Date: 02/10/2015 PT Individual Time: 1007-1038 PT Individual Time Calculation (min): 31 min   Short Term Goals: Week 3:  PT Short Term Goal 1 (Week 3): Continue to work towards LTG's due to pt progress.   Skilled Therapeutic Interventions/Progress Updates:   Pt received in w/c with husband present.  Pt just received L orthosis and toe cap.  Pt and husband willing and ready to participate in family education.  Husband reports he and the patient have been ambulating in the room without and AD or other assistance.  Educated patient and husband on patient's impairments that place her at high risk for falls and strongly recommended that pt and husband adhere to therapy recommendations of ambulating with min A with RW and continue to wait on staff assistance until cleared by therapy staff to do so without staff.  Pt and husband verbalized understanding.  Transported to car simulator and car set up to simulate tall SUV with running board.  Pt and husband demonstrated w/c set pt with 50% cues from therapist for safe placement.  Pt and husband demonstrated stand pivot into car with husband providing min A but no verbal cues for safety and sequencing and with pt throwing herself backwards into the car and lying across seat on LUE to bring LE into the car.  Therapist stepped in and discussed safety, sequencing and energy conservation with pt and husband.  Demonstrated and verbalized safer, more efficient sequence to patient.  Pt and husband gave repeat demonstration of stand pivot sequence with UE support on door and husband providing min A with pt demonstrating improved safety with pivot and transferring LE into and out of car; pt also demonstrated improved balance and postural control.  Transitioned to ADL apartment where pt and husband performed ambulation over carpet with RW and min A  with verbal cues for safe sit <> stand and hand placement on L orthosis.  Pt demonstrated safe bed mobility supine <> sit on flat bed, no rails with supervision.  Discussed with patient plan for toileting during the night and first thing in the am.  Pt reports she typically walks to the toilet and does not plan on donning shoes and orthosis first.  Husband reporting that he has a BSC at home and he plans on having the patient awake him every time she needs to toilet so that he can set up the [facility] and provide supervision during transfers.  Pt able to demonstrate safe squat pivot bed <> BSC with supervision.  Returned to room in w/c and pt set up with all items within reach.  Therapy Documentation Precautions:  Precautions Precautions: Fall Precaution Comments: L inattention Restrictions Weight Bearing Restrictions: No Pain: Pain Assessment Pain Assessment: No/denies pain Locomotion : Ambulation Ambulation/Gait Assistance: 4: Min assist   See FIM for current functional status  Therapy/Group: Individual Therapy  [name] Faucette 02/10/2015, 11:00 AM

## 2015-02-10 NOTE — Progress Notes (Signed)
Social Work Patient ID: Diane Hutchinson, female   DOB: March 03, 1946, 69 y.o.   MRN: [id number] Met with pt and husband to inform team feels pt will be ready for discharge on Friday once family education completed tomorrow with husband. Both pleased with the plan and are learning much today, pt needs to be patient and husband needs to make pt wait for him when he is ready before transferring or Ambulating. Will work on discharge plans for Friday.

## 2015-02-10 NOTE — Progress Notes (Signed)
Social Work Patient ID: Diane Hutchinson, female   DOB: 08-Jun-1946, 69 y.o.   MRN: [id number] Met with pt and husband who report family education is going well and pt really wants to go home by Friday. Discussed equipment needs and follow up. Will begin with home health and transition to OP therapies. Husband is tired from the am therapies, but plans to be here this afternoon and all day tomorrow.

## 2015-02-10 NOTE — Progress Notes (Signed)
Subjective/Complaints: Participating in therapies. No new complaints. Sleeping well. No cp,sob,n/v/d. Review of Systems - Negative except as above  Objective: Vital Signs: Blood pressure 138/60, pulse 65, temperature 98 F (36.7 C), temperature source Oral, resp. rate 18, height 5\' 11"  (1.803 m), weight 78.744 kg (173 lb 9.6 oz), SpO2 93 %. No results found. No results found for this or any previous visit (from the past 72 hour(s)).   HEENT: normal Cardio: RRR and no murmur Resp: CTA B/L and unlabored GI: BS positive and NT, ND Extremity:  Pulses positive and No Edema Skin:   Bruise multiple bruises LUE>LLE improving Neuro: Alert/Oriented with reasonable insight and awareness. Abnormal Sensory Absent LT and proprio LUE.LLE, Abnormal Motor 4/5 LUE, 4/5 LLE with impaired motor control  Abnormal FMC Ataxic/ dec FMC Musc/Skel:    Left hand without pain during ROM Gen NAD   Assessment/Plan: 1. Functional deficits secondary to Right MCA infarct with left hemiparesis which require 3+ hours per day of interdisciplinary therapy in a comprehensive inpatient rehab setting. Physiatrist is providing close team supervision and 24 hour management of active medical problems listed below. Physiatrist and rehab team continue to assess barriers to discharge/monitor patient progress toward functional and medical goals.  FIM: FIM - Bathing Bathing Steps Patient Completed: Chest, Left Arm, Abdomen, Front perineal area, Buttocks, Right upper leg, Left upper leg, Right Arm, Right lower leg (including foot) Bathing: 4: Min-Patient completes 8-9 47f 10 parts or 75+ percent  FIM - Upper Body Dressing/Undressing Upper body dressing/undressing steps patient completed: Thread/unthread right sleeve of pullover shirt/dresss, Thread/unthread left sleeve of pullover shirt/dress, Put head through opening of pull over shirt/dress, Pull shirt over trunk Upper body dressing/undressing: 5: Set-up assist to: Obtain  clothing/put away FIM - Lower Body Dressing/Undressing Lower body dressing/undressing steps patient completed: Thread/unthread right pants leg, Thread/unthread left pants leg, Don/Doff right shoe, Thread/unthread right underwear leg, Thread/unthread left underwear leg, Pull pants up/down Lower body dressing/undressing: 3: Mod-Patient completed 50-74% of tasks  FIM - Toileting Toileting steps completed by patient: Performs perineal hygiene, Adjust clothing prior to toileting, Adjust clothing after toileting Toileting Assistive Devices: Grab bar or rail for support Toileting: 4: Steadying assist  FIM - Radio producer Devices: Elevated toilet seat, Grab bars Toilet Transfers: 3-To toilet/BSC: Mod A (lift or lower assist), 3-From toilet/BSC: Mod A (lift or lower assist)  FIM - Control and instrumentation engineer Devices: Orthosis, Arm rests Bed/Chair Transfer: 0: Activity did not occur  FIM - Locomotion: Wheelchair Distance: 60 Locomotion: Wheelchair: 2: Travels 50 - 149 ft with minimal assistance (Pt.>75%) FIM - Locomotion: Ambulation Locomotion: Ambulation Assistive Devices: Orthosis, Administrator Ambulation/Gait Assistance: 4: Min assist Locomotion: Ambulation: 2: Travels 50 - 149 ft with minimal assistance (Pt.>75%)  Comprehension Comprehension Mode: Auditory Comprehension: 5-Understands complex 90% of the time/Cues < 10% of the time  Expression Expression Mode: Verbal Expression Assistive Devices: 6-Talk trach valve Expression: 5-Expresses complex 90% of the time/cues < 10% of the time  Social Interaction Social Interaction: 5-Interacts appropriately 90% of the time - Needs monitoring or encouragement for participation or interaction.  Problem Solving Problem Solving: 5-Solves basic problems: With no assist  Memory Memory: 5-Recognizes or recalls 90% of the time/requires cueing < 10% of the time  Medical Problem List and  Plan: 1. 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  DVT Prophylaxis/Anticoagulation: SQ Lovenox.monitor for any bleeding episodes 3. Pain Management: tylenol as needed 4.  Mood/depression/anxiety: Risperdal 0.5 mg QHS/Desyrel 150 mg QHS,Zoloft 100 mg daily,Xanax 1 mg TID as needed. Team to provide ego-support as well.  5. Neuropsych: This patient is capable of making decisions on her own behalf. 6. Skin/Wound Care: continue observation, appropriate nutrition 7. Fluids/Electrolytes/Nutrition: encourage po 8.Hyperlipidemia.Lipitor/Lovaza 9.  Hypokalemia, supplement   LOS (Days) 19 A FACE TO FACE EVALUATION WAS PERFORMED  Mazi Brailsford T 02/10/2015, 9:07 AM

## 2015-02-10 NOTE — Progress Notes (Signed)
Occupational Therapy Session Note  Patient Details  Name: Diane Hutchinson MRN: [id number] Date of Birth: 1945-12-20  Today's Date: 02/10/2015 OT Individual Time: 0800-0900 OT Individual Time Calculation (min): 60 min    Short Term Goals: Week 3:  OT Short Term Goal 1 (Week 3): STGs=LTGs  Skilled Therapeutic Interventions/Progress Updates:  Upon entering the room, pt seated EOB finishing breakfast with husband present in the room for family education for upcoming discharge. Pt with no c/o pain this session. Pt declined shower and requested sink bathing. Her husband, [name], observed Min A squat pivot transfer from bed >wheelchair. OT discussed safety concerns with wheelchair and patients body during functional transfers. Pt seated in wheelchair at sink side for bathing. Pt washing UB and requesting to toilet. OT demonstrated Min A stand pivot to elevated toilet seat. Husband assisted pt with clothing management and demonstrated Min A stand pivot back to wheelchair. Pt returned to sink to complete LB dressing and bathing. Pt requiring Min A balance when standing for LB ADLs. Pt also demonstrating increased safety awareness with L UE as she only required min verbal cues this session for safety during functional tasks and transfers. Pt seated in wheelchair with call bell within reach. OT educated pt and caregiver on OT recommendations of [place] following discharge. Family education to continue.   Therapy Documentation Precautions:  Precautions Precautions: Fall Precaution Comments: L inattention Restrictions Weight Bearing Restrictions: No Pain: Pain Assessment Pain Assessment: No/denies pain ADL: ADL ADL Comments: see FIM  See FIM for current functional status  Therapy/Group: Individual Therapy  Phineas Semen 02/10/2015, 12:38 PM

## 2015-02-10 NOTE — Progress Notes (Signed)
Physical Therapy Session Note  Patient Details  Name: Diane Hutchinson MRN: [id number] Date of Birth: Jul 05, 1946  Today's Date: 02/10/2015 PT Individual Time: 1300-1345 and 1515-1600 PT Individual Time Calculation (min): 45 min and 45 mins  Short Term Goals: Week 3:  PT Short Term Goal 1 (Week 3): Continue to work towards LTG's due to pt progress.   Skilled Therapeutic Interventions/Progress Updates:   First PM session:  Pt received sitting in w/c, agreeable to therapy session.  Husband present for hands on training during session and education.  Skilled session focused on gait training with RW, car transfer, functional transfers, gait in controlled and home environment, gait around obstacles, and gait up/down ramp to simulate home entry.  Pt assisted to ortho gym to perform car transfer.  Note that PT had performed with them both earlier, therefore this PT wanting to see if they could recall steps for safety and sequencing.  Pt able to perform at min/guard to close S level.  Educated husband that he needs to cue pt regarding attention to [place] during transfers as she transferred onto her hand.  Also cues to place his hands on her when coming out of the car for safety.  Transitioned to gait into ADL apt with RW, L HO, L AFO with husband assisting.  Again, provided cues to husband on ensuring safety of LLE, esp when turning, backing all the way up to surface, removing hand from HO, and reaching back.  He continued to require these cues throughout.  Performed bed mobility at S level.  Ambulated to ADL couch with pt able to get up from couch at S level with cues for scooting forward and increased forward weight shift, as well as to husband on how to assist if needed.  Assisted pt to therapy gym to perform gait up/down incline ramp to simulate home.  Performed at min A level with husband providing good assist, but again to cue pt for safety.  Pt ascended/descended end of ramp to simulate curb at min A  level.  Ended session with gait around and over obstacles on carpet to simulate tight spaces at home for safety and cues.  Pt did well, but husband needing assist to cue pt for safety of LLE and increased foot clearance.  Husband assisted back to room and pt left in w/c with all needs in reach.    Second PM session:  Pt received sitting in w/c, agreeable to therapy session.  Husband present for continued education and hands on training. Continue to educate that PT/OT recommend continuous hands on pt when she is up moving, as well as 24/7 S and assist for safety.  Husband did state again that they would get bedside commode to avoid her having to don/doff shoes in the night.  Assisted pt to ADL kitchen in order to address retrieving items from cabinet, safety and how to maneuver objects for increased safety when using RW.  Pt able to problem solve to scoot items down counter then walk to them.  Educated on using LUE as much as possible and also for stabilizer as needed.  Assisted to therapy gym to perform floor recovery.  Demonstrated on how to perform and when to call for assist.  Performed at max A level with max verbal cues for safety when returning to seated position.  Recommend that husband call 911 to assist her into standing in case of fall to ensure both of their safety.  Both verbalized understanding.  Ended session with  husband performing stand pivot transfer to toilet from w/c at S level with husband assisting with clothing.  Performed x 2 for increased safety and to ensure husband giving appropriate cues.  Husband signed off to perform with pt during they day.  Squat pivot transfer to the bed with husband, however requires continued cues for safety and technique, therefore did not sign off husband on this today.  Pt left in bed with all needs in reach.  Note increased bruising on L foot, RN made aware.    Therapy Documentation Precautions:  Precautions Precautions: Fall Precaution Comments: L  inattention Restrictions Weight Bearing Restrictions: No   Pain:  Pt with pain in L knee following floor transfer, declined pain meds.   See FIM for current functional status  Therapy/Group: Individual Therapy  [name] 02/10/2015, 4:40 PM

## 2015-02-11 ENCOUNTER — Inpatient Hospital Stay (HOSPITAL_COMMUNITY): Payer: Medicare Other | Admitting: Occupational Therapy

## 2015-02-11 ENCOUNTER — Inpatient Hospital Stay (HOSPITAL_COMMUNITY): Payer: Medicare Other

## 2015-02-11 NOTE — Plan of Care (Signed)
Problem: RH Wheelchair Mobility Goal: LTG Patient will propel w/c in controlled environment (PT) LTG: Patient will propel wheelchair in controlled environment, # of feet with assist (PT)  Outcome: Not Met (add Reason) Patient requires supervision/cueing to attend to left extremities for safety. Goal: LTG Patient will propel w/c in community environment (PT) LTG: Patient will propel wheelchair in community environment, # of feet with assist (PT)  Outcome: Not Met (add Reason) Patient requires supervision/cueing to attend to left extremities for safety.

## 2015-02-11 NOTE — Discharge Instructions (Signed)
Inpatient Rehab Discharge Instructions  ROCKY RISHEL Discharge date and time: 02/12/15   Activities/Precautions/ Functional Status: Activity: activity as tolerated Diet: cardiac diet Wound Care: none needed   Functional status:  ___ No restrictions     ___ Walk up steps independently ___ 24/7 supervision/assistance   ___ Walk up steps with assistance ___ Intermittent supervision/assistance  ___ Bathe/dress independently ___ Walk with walker     ___ Bathe/dress with assistance ___ Walk Independently    ___ Shower independently ___ Walk with assistance    ___ Shower with assistance ___ No alcohol     ___ Return to work/school ________  Special Instructions:     COMMUNITY REFERRALS UPON DISCHARGE:    Home Health:   PT, OT, SP, [place] CLEXN:[phone] Date of last service:02/12/2015   Medical Equipment/Items Ordered:WHEELCHAIR, [place]    [phone]   GENERAL COMMUNITY RESOURCES FOR PATIENT/FAMILY: Support Groups:[facility] SUPPORT GROUP  STROKE/TIA DISCHARGE INSTRUCTIONS SMOKING Cigarette smoking nearly doubles your risk of having a stroke & is the single most alterable risk factor  If you smoke or have smoked in the last 12 months, you are advised to quit smoking for your health.  Most of the excess cardiovascular risk related to smoking disappears within a year of stopping.  Ask you doctor about anti-smoking medications  [place] Quit Line: 1-800-QUIT NOW  Free Smoking Cessation Classes [phone]  CHOLESTEROL Know your levels; limit fat & cholesterol in your diet  Lipid Panel     Component Value Date/Time   CHOL 116 01/22/2015 0000   TRIG 146 01/22/2015 0000   HDL 34* 01/22/2015 0000   CHOLHDL 3.4 01/22/2015 0000   VLDL 29 01/22/2015 0000   LDLCALC 53 01/22/2015 0000      Many patients benefit from treatment even if their cholesterol is at goal.  Goal: Total Cholesterol (CHOL) less  than 160  Goal:  Triglycerides (TRIG) less than 150  Goal:  HDL greater than 40  Goal:  LDL (LDLCALC) less than 100   BLOOD PRESSURE American Stroke Association blood pressure target is less that 120/80 mm/Hg  Your discharge blood pressure is:  BP: (!) 125/58 mmHg  Monitor your blood pressure  Limit your salt and alcohol intake  Many individuals will require more than one medication for high blood pressure  DIABETES (A1c is a blood sugar average for last 3 months) Goal HGBA1c is under 7% (HBGA1c is blood sugar average for last 3 months)  Diabetes: No known diagnosis of diabetes    Lab Results  Component Value Date   HGBA1C 6.0* 01/21/2015     Your HGBA1c can be lowered with medications, healthy diet, and exercise.  Check your blood sugar as directed by your physician  Call your physician if you experience unexplained or low blood sugars.  PHYSICAL ACTIVITY/REHABILITATION Goal is 30 minutes at least 4 days per week  Activity: No driving, Therapies: See above Return to work:  n/a  Activity decreases your risk of heart attack and stroke and makes your heart stronger.  It helps control your weight and blood pressure; helps you relax and can improve your mood.  Participate in a regular exercise program.  Talk with your doctor about the best form of exercise for you (dancing, walking, swimming, cycling).  DIET/WEIGHT Goal is to maintain a healthy weight  Your discharge diet is: Diet regular thin liquids Your height is:  Height: 5\' 11"  (180.3 cm)  Your current weight is: Weight: 78.6 kg (173 lb 4.5 oz) Your Body Mass Index (BMI) is:  BMI (Calculated): 24.2  Following the type of diet specifically designed for you will help prevent another stroke.  Your are at  goal weight.  Your goal Body Mass Index (BMI) is 19-24.  Healthy food habits can help reduce 3 risk factors for stroke:  High cholesterol, hypertension, and excess weight.  RESOURCES Stroke/Support Group:  Call  [phone]   STROKE EDUCATION PROVIDED/REVIEWED AND GIVEN TO PATIENT Stroke warning signs and symptoms How to activate emergency medical system (call 911). Medications prescribed at discharge. Need for follow-up after discharge. Personal risk factors for stroke. Pneumonia vaccine given:  Flu vaccine given:  My questions have been answered, the writing is legible, and I understand these instructions.  I will adhere to these goals & educational materials that have been provided to me after my discharge from the hospital.       My questions have been answered and I understand these instructions. I will adhere to these goals and the provided educational materials after my discharge from the hospital.  Patient/Caregiver Signature _______________________________ Date __________  Clinician Signature _______________________________________ Date __________  Please bring this form and your medication list with you to all your follow-up doctor's appointments.

## 2015-02-11 NOTE — Plan of Care (Signed)
Problem: RH Bathing Goal: LTG Patient will bathe with assist, cues/equipment (OT) LTG: Patient will bathe specified number of body parts with assist with/without cues using equipment (position) (OT)  Outcome: Not Met (add Reason) Requires steady assist for safety  Problem: RH Toileting Goal: LTG Patient will perform toileting w/assist, cues/equip (OT) LTG: Patient will perform toiletiing (clothes management/hygiene) with assist, with/without cues using equipment (OT)  Outcome: Not Met (add Reason) Pt requiring steady assist for safety with clothing management  Problem: RH Toilet Transfers Goal: LTG Patient will perform toilet transfers w/assist (OT) LTG: Patient will perform toilet transfers with assist, with/without cues using equipment (OT)  Outcome: Not Met (add Reason) Requires steady assist without use of grab bar and towards L (hemiplegic) side

## 2015-02-11 NOTE — Progress Notes (Signed)
Occupational Therapy Session Note  Patient Details  Name: Diane Hutchinson MRN: [id number] Date of Birth: 12/13/1945  Today's Date: 02/11/2015 OT Individual Time: 0900-1000 and 1400-1500 OT Individual Time Calculation (min): 60 min and 60 min   Short Term Goals: Week 3 :  Short term goals = LTGs  Skilled Therapeutic Interventions/Progress Updates:  Session 1: Upon entering the room, pt supine in bed with no c/o pain. Husband present in room for continued family education. Husband assisted pt with self care session with verbal cues and education from therapist as needed for safety. Pt performed supine >sit from flat bed with no assist. Min A squat pivot transfer from wheelchair > bed as pt propelled in bathroom for shower transfer. Husband assisted pt with verbal cues from therapist for safety to transfer onto shower seat with min A and use of grab bars. Bathing at shower level with pt able to hold onto washcloth in L hand to assist with bathing. Pt requiring min A with balance when standing from chair to wash buttocks and peri area. Pt transferred back to wheelchair with min A squat pivot and dressed at sink side. Caregiver requiring min verbal cues to always have hand on patient when standing for safety. Pt dressed and seated in wheelchair with call bell and all needed items within reach.   Session 2: Upon entering the room, pt seated in wheelchair with no c/o pain this session. Husband present in room to continue family education. He assists pt into bathroom for min squat pivot onto elevated toilet seated. Pt and husband communicated effectively in order to increase safety of transfer. Pt standing with steady assist for clothing management in order to void. Pt and husband practiced multiple transfers wheelchair <> elevated toilet seat. On one occasion pt began to have LOB and husband assisted pt in correcting to prevent fall without cues or assist from OT. Next pt and husband practiced  multiple bouts of squat pivot transfer wheelchair <> shower seat with Min A as well. Pt utilized grab bar for transfer as she will need grab bar to wash buttocks and peri area during bathing. OT recommended pt does not utilize shower at home until grab bars are placed for safety. OT educated pt and caregiver on functional tasks in order to continued to address strength and coordination with L UE. They verbalized understanding and asked questions as appropriate. Pt with 6/10 c/o pain on foot from AFO brace. OT recommends they purchase larger size shoes as it appears that with brace in foot pt requires additional room for digits. Pt seated in wheelchair with husband remaining upon exiting the room.    Therapy Documentation Precautions:  Precautions Precautions: Fall Precaution Comments: L inattention Required Braces or Orthoses: Other Brace/Splint Other Brace/Splint: left ankle orthosis Restrictions Weight Bearing Restrictions: No  Pain: Pain Assessment Pain Assessment: No/denies pain Pain Score: 0-No pain ADL: ADL ADL Comments: see FIM  See FIM for current functional status  Therapy/Group: Individual Therapy  Phineas Semen 02/11/2015, 12:25 PM

## 2015-02-11 NOTE — Progress Notes (Signed)
Subjective/Complaints: Had some increased swelling in left leg. Better over night. Asked if she could stop wearing TEDS Review of Systems - Negative except as above  Objective: Vital Signs: Blood pressure 144/49, pulse 64, temperature 98.2 F (36.8 C), temperature source Oral, resp. rate 18, height 5\' 11"  (1.803 m), weight 78.6 kg (173 lb 4.5 oz), SpO2 94 %. No results found. No results found for this or any previous visit (from the past 72 hour(s)).   HEENT: normal Cardio: RRR and no murmur Resp: CTA B/L and unlabored GI: BS positive and NT, ND Extremity:  Pulses positive and minimal LL Edema Skin:   Bruise multiple bruises LUE>LLE improving Neuro: Alert/Oriented with reasonable insight and awareness. Abnormal Sensory Absent LT and proprio LUE.LLE, Abnormal Motor 4/5 LUE, 4/5 LLE with impaired motor control  Abnormal FMC Ataxic/ dec FMC Musc/Skel:    Left hand without pain during ROM Gen NAD   Assessment/Plan: 1. Functional deficits secondary to Right MCA infarct with left hemiparesis which require 3+ hours per day of interdisciplinary therapy in a comprehensive inpatient rehab setting. Physiatrist is providing close team supervision and 24 hour management of active medical problems listed below. Physiatrist and rehab team continue to assess barriers to discharge/monitor patient progress toward functional and medical goals.  Discharge moved up to tomorrow  FIM: FIM - Bathing Bathing Steps Patient Completed: Chest, Left Arm, Abdomen, Front perineal area, Buttocks, Right upper leg, Left upper leg, Right Arm, Right lower leg (including foot) Bathing: 4: Min-Patient completes 8-9 84f 10 parts or 75+ percent  FIM - Upper Body Dressing/Undressing Upper body dressing/undressing steps patient completed: Thread/unthread right sleeve of pullover shirt/dresss, Thread/unthread left sleeve of pullover shirt/dress, Put head through opening of pull over shirt/dress, Pull shirt over  trunk Upper body dressing/undressing: 5: Supervision: Safety issues/verbal cues FIM - Lower Body Dressing/Undressing Lower body dressing/undressing steps patient completed: Thread/unthread right underwear leg, Thread/unthread left underwear leg, Thread/unthread right pants leg, Thread/unthread left pants leg, Pull pants up/down Lower body dressing/undressing: 3: Mod-Patient completed 50-74% of tasks  FIM - Toileting Toileting steps completed by patient: Adjust clothing prior to toileting, Adjust clothing after toileting Toileting Assistive Devices: Grab bar or rail for support Toileting: 3: Mod-Patient completed 2 of 3 steps  FIM - Radio producer Devices: Elevated toilet seat Toilet Transfers: 3-To toilet/BSC: Mod A (lift or lower assist), 3-From toilet/BSC: Mod A (lift or lower assist)  FIM - Control and instrumentation engineer Devices: Copy: 5: Supine > Sit: Supervision (verbal cues/safety issues), 5: Sit > Supine: Supervision (verbal cues/safety issues), 4: Bed > Chair or W/C: Min A (steadying Pt. > 75%), 4: Chair or W/C > Bed: Min A (steadying Pt. > 75%)  FIM - Locomotion: Wheelchair Distance: 60 Locomotion: Wheelchair: 1: Total Assistance/staff pushes wheelchair (Pt<25%) FIM - Locomotion: Ambulation Locomotion: Ambulation Assistive Devices: Orthosis, Administrator Ambulation/Gait Assistance: 4: Min assist Locomotion: Ambulation: 1: Travels less than 50 ft with minimal assistance (Pt.>75%)  Comprehension Comprehension Mode: Auditory Comprehension: 5-Understands complex 90% of the time/Cues < 10% of the time  Expression Expression Mode: Verbal Expression Assistive Devices: 6-Talk trach valve Expression: 5-Expresses complex 90% of the time/cues < 10% of the time  Social Interaction Social Interaction: 5-Interacts appropriately 90% of the time - Needs monitoring or encouragement for participation or  interaction.  Problem Solving Problem Solving: 5-Solves basic problems: With no assist  Memory Memory: 5-Recognizes or recalls 90% of the time/requires cueing < 10% of the time  Medical Problem List and Plan: 1. 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  DVT Prophylaxis/Anticoagulation: SQ Lovenox.monitor for any bleeding episodes  -may use TEDS prn 3. Pain Management: tylenol as needed 4. Mood/depression/anxiety: Risperdal 0.5 mg QHS/Desyrel 150 mg QHS,Zoloft 100 mg daily,Xanax 1 mg TID as needed. Team to provide ego-support as well.  5. Neuropsych: This patient is capable of making decisions on her own behalf. 6. Skin/Wound Care: continue observation, appropriate nutrition 7. Fluids/Electrolytes/Nutrition: encourage po 8.Hyperlipidemia.Lipitor/Lovaza 9.  Hypokalemia, supplement   LOS (Days) 20 A FACE TO FACE EVALUATION WAS PERFORMED  Thersea Hutchinson T 02/11/2015, 8:50 AM

## 2015-02-11 NOTE — Progress Notes (Signed)
Occupational Therapy Discharge Summary  Patient Details  Name: Diane Hutchinson MRN: [id number] Date of Birth: 1946/07/09   Patient has met 6 of 9 long term goals due to improved activity tolerance, improved balance, postural control, ability to compensate for deficits, functional use of  LEFT upper and LEFT lower extremity, improved attention, improved awareness and improved coordination.  Patient to discharge at overall supervision- min A level.  Patient's care partner is independent to provide the necessary physical and cognitive assistance at discharge.    Reasons goals not met: Pt requiring steady assist for toilet transfer, toileting, and bathing tasks for safety.  Recommendation:  Patient will benefit from ongoing skilled OT services in home health setting to continue to advance functional skills in the area of BADL.  Equipment: shower chair  Recommended placement of grab bars in shower  Reasons for discharge: discharge from hospital  Patient/family agrees with progress made and goals achieved: Yes  OT Discharge Precautions/Restrictions  Precautions Precautions: Fall Precaution Comments: L inattention Required Braces or Orthoses: Other Brace/Splint Other Brace/Splint: left ankle orthosis Vital Signs Therapy Vitals Temp: 98.8 F (37.1 C) Temp Source: Oral Pulse Rate: 60 Resp: 18 BP: (!) 125/58 mmHg Patient Position (if appropriate): Lying Oxygen Therapy SpO2: 95 % O2 Device: Not Delivered Pain Pain Assessment Pain Assessment: No/denies pain Pain Score: 0-No pain ADL ADL ADL Comments: see FIM Vision/Perception  Vision- History Baseline Vision/History: Wears glasses Wears Glasses: At all times Patient Visual Report: No change from baseline  Cognition Overall Cognitive Status: Within Functional Limits for tasks assessed Arousal/Alertness: Awake/alert Orientation Level: Oriented X4 Sensation Sensation Light Touch: Impaired Detail Light Touch Impaired  Details: Impaired LUE;Impaired LLE Stereognosis: Impaired Detail Stereognosis Impaired Details: Impaired LUE;Impaired LLE Hot/Cold: Not tested Proprioception: Not tested Proprioception Impaired Details: Impaired LLE;Impaired LUE Coordination Gross Motor Movements are Fluid and Coordinated: No Fine Motor Movements are Fluid and Coordinated: No Coordination and Movement Description: patient able to actively perform tasks with left UE and LE but continues to lack control. Motor  Motor Motor: Hemiplegia;Ataxia Mobility  Bed Mobility Supine to Sit: 5: Supervision Supine to Sit Details: Verbal cues for precautions/safety Transfers Sit to Stand: 5: Supervision Sit to Stand Details: Verbal cues for precautions/safety Stand to Sit: 5: Supervision Stand to Sit Details (indicate cue type and reason): Verbal cues for precautions/safety  Balance Balance Balance Assessed: Yes Dynamic Standing Balance Dynamic Standing - Balance Support: During functional activity Dynamic Standing - Level of Assistance: 4: Min assist Dynamic Standing - Balance Activities: Lateral lean/weight shifting;Forward lean/weight shifting;Reaching for objects;Reaching across midline Extremity/Trunk Assessment RUE Assessment RUE Assessment: Within Functional Limits LUE Assessment LUE Assessment: Exceptions to [facility] LUE AROM (degrees) Overall AROM Left Upper Extremity: Deficits LUE Strength LUE Overall Strength: Deficits  See FIM for current functional status  Phineas Semen 02/11/2015, 5:09 PM

## 2015-02-11 NOTE — Progress Notes (Signed)
Physical Therapy Session Note  Patient Details  Name: Diane Hutchinson MRN: [id number] Date of Birth: 1945-11-26  Today's Date: 02/11/2015 PT Individual Time: 1110-1210 PT Individual Time Calculation (min): 60 min   Short Term Goals: Week 3:  PT Short Term Goal 1 (Week 3): Continue to work towards LTG's due to pt progress.   Skilled Therapeutic Interventions/Progress Updates:  Patient sitting in wheelchair upon entering room. Patient participated in grad day activities. Patient ambulated up and down 6 steps with 1 rail and min assist from husband. Patient performed car transfer with supervision from husband once wheelchair was set up. Patient performed ambulation in home like setting with rolling walker, hand and ankle orthosis with husband's min assist. Patient demonstrated supine <> sit and bed mobility on regular bed with occasional cueing from husband to protect left extremities. Patient demonstrated transfer to loveseat with supervision/cueing for precautions and safety (release hand prior to sitting). Patients husband cued patient about 90% of the time regarding hand. Patient ambulated with husband's min assist 125 feet with RW and orthoses. Patient propelled wheelchair 300 feet using right UE and bilateral LE's with occasional cueing to attend to left extremities. PT discussed maintaining activity at home following discharge, protecting left extremities and frequent skin checks, and fine motor activities to work on left UE control. Patient and husband report verbal understanding. Patient left in room with husband and all items in reach.  Therapy Documentation Precautions:  Precautions Precautions: Fall Precaution Comments: L inattention Required Braces or Orthoses: Other Brace/Splint Other Brace/Splint: left ankle orthosis Restrictions Weight Bearing Restrictions: No  Pain: Pain Assessment Pain Assessment: No/denies pain Pain Score: 0-No pain Mobility: Bed Mobility Supine  to Sit: 5: Supervision Supine to Sit Details: Verbal cues for precautions/safety Transfers Transfers: Yes Sit to Stand: 5: Supervision Sit to Stand Details: Verbal cues for precautions/safety Stand to Sit: 5: Supervision Stand to Sit Details (indicate cue type and reason): Verbal cues for precautions/safety Stand Pivot Transfers: 5: Supervision Stand Pivot Transfer Details: Verbal cues for precautions/safety Locomotion : Ambulation Ambulation: Yes Ambulation/Gait Assistance: 4: Min assist Ambulation Distance (Feet): 125 Feet Assistive device: Rolling walker;Other (Comment) (hand orthosis on walker and left ankle orthosis) Gait Gait: Yes Gait Pattern: Impaired Gait Pattern: Step-through pattern;Decreased step length - left;Decreased stance time - left;Left flexed knee in stance;Decreased weight shift to left;Trunk flexed;Ataxic Stairs / Additional Locomotion Stairs: Yes Stairs Assistance: 4: Min assist Stair Management Technique: One rail Right;Step to pattern Number of Stairs: 6 Ramp: 4: Min assist Curb: 4: Min Chemical engineer: Yes Wheelchair Assistance: 5: Investment banker, operational Details: Verbal cues for Information systems manager: Right upper extremity;Both lower extermities Wheelchair Parts Management: Needs assistance Distance: 300   See FIM for current functional status  Therapy/Group: Individual Therapy  Sanjuana Letters 02/11/2015, 12:25 PM

## 2015-02-11 NOTE — Progress Notes (Signed)
Recreational Therapy Discharge Summary Patient Details  Name: Diane Hutchinson MRN: [id number] Date of Birth: May 08, 1946 Today's Date: 02/11/2015  Long term goals set: 1  Long term goals met: 1  Comments on progress toward goals: Pt has made great progress toward LTG and is anxious to return home.  Pt is discharging at supervision level/min cues for simple TR tasks seated.  Pt does require verbal cuing for safety especially in management of LUE.  Pt participated in community reintegration/lunch outing at [place] assist level.  Pt is discharging home with husband to provide 24 hour supervision/assist.  Reasons for discharge: discharge from hospital  Patient/family agrees with progress made and goals achieved: Yes  Diane Hutchinson 02/11/2015, 1:17 PM

## 2015-02-11 NOTE — Patient Care Conference (Signed)
Inpatient RehabilitationTeam Conference and Plan of Care Update Date: 02/10/2015   Time: 1:45 AM    Patient Name: Diane Hutchinson      Medical Record Number: [id number]  Date of Birth: 29-Aug-1946 Sex: Female         Room/Bed: 4W07C/4W07C-01 Payor Info: Payor: MEDICARE / Plan: MEDICARE PART A AND B / Product Type: *No Product type* /    Admitting Diagnosis: RT CVA  Admit Date/Time:  01/22/2015  4:03 PM Admission Comments: No comment available   Primary Diagnosis:  <principal problem not specified> Principal Problem: <principal problem not specified>  Patient Active Problem List   Diagnosis Date Noted  . Alterations of sensations following CVA (cerebrovascular accident)   . Embolic cerebral infarction 01/22/2015  . Left hemiparesis 01/22/2015  . Stroke   . Cerebral embolism with cerebral infarction 01/21/2015  . Ataxia   . Brain aneurysm 01/20/2015  . TOBACCO ABUSE 01/04/2010  . BRONCHITIS, CHRONIC 01/04/2010  . CERVICAL CANCER 01/03/2010  . HYPERLIPIDEMIA 01/03/2010  . DEPRESSION 01/03/2010  . SUBARACHNOID HEMORRHAGE 01/03/2010  . [name] 01/03/2010    Expected Discharge Date: Expected Discharge Date: 02/12/15  Team Members Present: Physician leading conference: Dr. Alger Simons Social Worker Present: Ovidio Kin, LCSW Nurse Present: Elliot Cousin, RN PT Present: [name], PT;Emily Rinaldo Cloud, PT OT Present: Benay Pillow, OT SLP Present: Windell Moulding, SLP PPS Coordinator present : Daiva Nakayama, RN, CRRN     Current Status/Progress Goal Weekly Team Focus  Medical   bp control is fair. low grade temp. po intake.   finalize dc planning  dispo planning   Bowel/Bladder   Pt contienent of bowel and bladder. Brief worn at night due to uragncy  To continue continent to bladder and bowel and manage min assist  assess need for further laxatives to move bowels   Swallow/Nutrition/ Hydration     na        ADL's   supervision for grooming and UB dressing, Min A  bathing and toileting, Mod A LB dressing and toilet transfer for lifting/lowering  supervision - min overall      Mobility   Pt is S for bed mobility, min A for all transfers, standing balance and gait  S to min A overall  LUE/LE NMR, transfers, gait, hands on family training, pt education   Communication     na        Safety/Cognition/ Behavioral Observations    no unsafe behaviors        Pain   no complaints of pain         Skin   scattered bruising throughout body. Right groin incision guaze inplace for mild driange. change as needed  remain free of skin breakdown and free of infection  assess skin q shift      *See Care Plan and progress notes for long and short-term goals.  Barriers to Discharge: supervision at home    Possible Resolutions to Barriers:  pt/family education    Discharge Planning/Teaching Needs:  Family education today and tomorrow with husband, see if can move up discharge to Friday. If goes well with husband today-pt wanting to go home      Team Discussion:  Husband here for family education for the next two days-in preparation of discharge Friday. Reaching goals- on min level, still has safety issues and husband needs to be right there with pt.  Revisions to Treatment Plan:  Moving up discharge to 4/1   Continued Need for  Acute Rehabilitation Level of Care: The patient requires daily medical management by a physician with specialized training in physical medicine and rehabilitation for the following conditions: Daily direction of a multidisciplinary physical rehabilitation program to ensure safe treatment while eliciting the highest outcome that is of practical value to the patient.: Yes Daily medical management of patient stability for increased activity during participation in an intensive rehabilitation regime.: Yes Daily analysis of laboratory values and/or radiology reports with any subsequent need for medication adjustment of medical intervention for :  Neurological problems  Rakan Soffer, Gardiner Rhyme 02/11/2015, 10:33 AM

## 2015-02-11 NOTE — Progress Notes (Signed)
Recreational Therapy Session Note  Patient Details  Name: Diane Hutchinson MRN: [id number] Date of Birth: 1946/04/08 Today's Date: 02/11/2015  Pain: no c/o Skilled Therapeutic Interventions/Progress Updates: Met with pt today to discuss discharge planning and use of leisure time once home and reviewed pt progress.  Pt stated she was anxious to return home and spend time with her dogs.  Re-enforced safety concerns with mobility, especially LUE.  Pt's husband present and participatory in discussion.  Therapy/Group: Individual Therapy   Tarez Bowns 02/11/2015, 1:14 PM

## 2015-02-11 NOTE — Progress Notes (Signed)
Social Work [name], LCSW Social Worker Signed  Patient Care Conference 02/11/2015  8:44 AM    Expand All Collapse All   Inpatient RehabilitationTeam Conference and Plan of Care Update Date: 02/10/2015   Time: 1:45 AM     Patient Name: PRESLEA RHODUS       Medical Record Number: [id number]  Date of Birth: October 03, 1946 Sex: Female         Room/Bed: 4W07C/4W07C-01 Payor Info: Payor: MEDICARE / Plan: MEDICARE PART A AND B / Product Type: *No Product type* /    Admitting Diagnosis: RT CVA   Admit Date/Time:  01/22/2015  4:03 PM Admission Comments: No comment available   Primary Diagnosis:  <principal problem not specified> Principal Problem: <principal problem not specified>    Patient Active Problem List     Diagnosis  Date Noted   .  Alterations of sensations following CVA (cerebrovascular accident)     .  Embolic cerebral infarction  01/22/2015   .  Left hemiparesis  01/22/2015   .  Stroke     .  Cerebral embolism with cerebral infarction  01/21/2015   .  Ataxia     .  Brain aneurysm  01/20/2015   .  TOBACCO ABUSE  01/04/2010   .  BRONCHITIS, CHRONIC  01/04/2010   .  CERVICAL CANCER  01/03/2010   .  HYPERLIPIDEMIA  01/03/2010   .  DEPRESSION  01/03/2010   .  SUBARACHNOID HEMORRHAGE  01/03/2010   .  [name]  01/03/2010     Expected Discharge Date: Expected Discharge Date: 02/12/15  Team Members Present: Physician leading conference: Dr. Alger Simons Social Worker Present: Ovidio Kin, LCSW Nurse Present: Elliot Cousin, RN PT Present: [name], PT;Emily Rinaldo Cloud, PT OT Present: Benay Pillow, OT SLP Present: Windell Moulding, SLP PPS Coordinator present : Daiva Nakayama, RN, CRRN        Current Status/Progress  Goal  Weekly Team Focus   Medical     bp control is fair. low grade temp. po intake.   finalize dc planning  dispo planning   Bowel/Bladder     Pt contienent of bowel and bladder. Brief worn at night due to uragncy  To continue continent to bladder  and bowel and manage min assist   assess need for further laxatives to move bowels    Swallow/Nutrition/ Hydration       na         ADL's     supervision for grooming and UB dressing, Min A bathing and toileting, Mod A LB dressing and toilet transfer for lifting/lowering  supervision - min overall      Mobility     Pt is S for bed mobility, min A for all transfers, standing balance and gait  S to min A overall  LUE/LE NMR, transfers, gait, hands on family training, pt education   Communication       na         Safety/Cognition/ Behavioral Observations      no unsafe behaviors         Pain     no complaints of pain         Skin     scattered bruising throughout body. Right groin incision guaze inplace for mild driange. change as needed  remain free of skin breakdown and free of infection   assess skin q shift      *See Care Plan and progress notes for long and  short-term goals.    Barriers to Discharge:  supervision at home     Possible Resolutions to Barriers:   pt/family education     Discharge Planning/Teaching Needs:   Family education today and tomorrow with husband, see if can move up discharge to Friday. If goes well with husband today-pt wanting to go home        Team Discussion:    Husband here for family education for the next two days-in preparation of discharge Friday. Reaching goals- on min level, still has safety issues and husband needs to be right there with pt.   Revisions to Treatment Plan:    Moving up discharge to 4/1    Continued Need for Acute Rehabilitation Level of Care: The patient requires daily medical management by a physician with specialized training in physical medicine and rehabilitation for the following conditions: Daily direction of a multidisciplinary physical rehabilitation program to ensure safe treatment while eliciting the highest outcome that is of practical value to the patient.: Yes Daily medical management of patient stability for  increased activity during participation in an intensive rehabilitation regime.: Yes Daily analysis of laboratory values and/or radiology reports with any subsequent need for medication adjustment of medical intervention for : Neurological problems  [name] 02/11/2015, 10:33 AM                 [name], LCSW Social Worker Signed  Patient Care Conference 02/03/2015  2:20 PM    Expand All Collapse All   Inpatient RehabilitationTeam Conference and Plan of Care Update Date: 02/03/2015   Time: 11;15 AM     Patient Name: NOAH PELAEZ       Medical Record Number: [id number]  Date of Birth: 09/03/1946 Sex: Female         Room/Bed: 4W07C/4W07C-01 Payor Info: Payor: MEDICARE / Plan: MEDICARE PART A AND B / Product Type: *No Product type* /    Admitting Diagnosis: RT CVA   Admit Date/Time:  01/22/2015  4:03 PM Admission Comments: No comment available   Primary Diagnosis:  <principal problem not specified> Principal Problem: <principal problem not specified>    Patient Active Problem List     Diagnosis  Date Noted   .  Alterations of sensations following CVA (cerebrovascular accident)     .  Embolic cerebral infarction  01/22/2015   .  Left hemiparesis  01/22/2015   .  Stroke     .  Cerebral embolism with cerebral infarction  01/21/2015   .  Ataxia     .  Brain aneurysm  01/20/2015   .  TOBACCO ABUSE  01/04/2010   .  BRONCHITIS, CHRONIC  01/04/2010   .  CERVICAL CANCER  01/03/2010   .  HYPERLIPIDEMIA  01/03/2010   .  DEPRESSION  01/03/2010   .  SUBARACHNOID HEMORRHAGE  01/03/2010   .  [name]  01/03/2010     Expected Discharge Date: Expected Discharge Date: 02/19/15  Team Members Present: Physician leading conference: Dr. Alysia Penna Social Worker Present: Ovidio Kin, LCSW Nurse Present: Heather Roberts, RN PT Present: Cameron Sprang, PT OT Present: Benay Pillow, OT PPS Coordinator present : Daiva Nakayama, RN, CRRN        Current Status/Progress   Goal  Weekly Team Focus   Medical     psych stable , severe sensory loss, transfers improving Mod A hemi walker , needs L AFO  home with supervision  orthotic management   Bowel/Bladder  continent of bowel and bladder. brief inplace due to urgancy   To continue continent to bladder and bowel and manage min assist   cont. stool softeners and encourage BSC    Swallow/Nutrition/ Hydration       na         ADL's     min assist UB bathing and LB bathing sit to stand, UB dressing min-supervision, min to mod for LB dressing, mod assist for toilet transfers and toileting tasks. Still with ataxia LUE and LLE requring min to mod assist for basic selfcare tasks.  supervision overall; LB dressing-min A   selfcare re-training, transfer training, neuromuscular re-education, balance re-training, pt/family education   Mobility     Pt currently min A for bed mobility, min A squat pivot transfers, min/mod for sit<>stand and max A for dynamic standing to facilitate decreased compensations.   S overall (will likely downgrade to minA on next weekly note)   LUE/LE NMR, transfers, standing balance, gait, pt/family education.    Communication       na         Safety/Cognition/ Behavioral Observations      no unsafe behaviors         Pain     no complaints of pain         Skin     scattered bruising. right groin foam inplace due tp incision with mild drainage  remain free of skin breakdown and free of infection   assess skin q shift      *See Care Plan and progress notes for long and short-term goals.    Barriers to Discharge:  poor awareness of left     Possible Resolutions to Barriers:   cont rehab     Discharge Planning/Teaching Needs:   Home with husband who has been here and observed in therapies, can provide 24 hr care       Team Discussion:    Making good progress-will need AFO for left leg prior to discharge. Brighter outlook. SP not following. Can get ambulatory if stays until 4/8.  Husband to take FMLA to provide care to pt at discharge.   Revisions to Treatment Plan:    None    Continued Need for Acute Rehabilitation Level of Care: The patient requires daily medical management by a physician with specialized training in physical medicine and rehabilitation for the following conditions: Daily direction of a multidisciplinary physical rehabilitation program to ensure safe treatment while eliciting the highest outcome that is of practical value to the patient.: Yes Daily medical management of patient stability for increased activity during participation in an intensive rehabilitation regime.: Yes Daily analysis of laboratory values and/or radiology reports with any subsequent need for medication adjustment of medical intervention for : Neurological problems;Other  Elion Hocker, Gardiner Rhyme 02/03/2015, 2:20 PM                 [name], LCSW Social Worker Signed  Patient Care Conference 01/27/2015  1:56 PM    Expand All Collapse All   Inpatient RehabilitationTeam Conference and Plan of Care Update Date: 01/27/2015   Time: 11;30 AM     Patient Name: CRYSTEL DEMARCO       Medical Record Number: [id number]  Date of Birth: Apr 01, 1946 Sex: Female         Room/Bed: 4W07C/4W07C-01 Payor Info: Payor: MEDICARE / Plan: MEDICARE PART A AND B / Product Type: *No Product type* /    Admitting Diagnosis: RT CVA  Admit Date/Time:  01/22/2015  4:03 PM Admission Comments: No comment available   Primary Diagnosis:  <principal problem not specified> Principal Problem: <principal problem not specified>    Patient Active Problem List     Diagnosis  Date Noted   .  Embolic cerebral infarction  01/22/2015   .  Left hemiparesis  01/22/2015   .  Stroke     .  Cerebral embolism with cerebral infarction  01/21/2015   .  Ataxia     .  Brain aneurysm  01/20/2015   .  TOBACCO ABUSE  01/04/2010   .  BRONCHITIS, CHRONIC  01/04/2010   .  CERVICAL CANCER  01/03/2010   .   HYPERLIPIDEMIA  01/03/2010   .  DEPRESSION  01/03/2010   .  SUBARACHNOID HEMORRHAGE  01/03/2010   .  [name]  01/03/2010     Expected Discharge Date: Expected Discharge Date: 02/19/15  Team Members Present: Physician leading conference: Dr. Alysia Penna Social Worker Present: Ovidio Kin, LCSW Nurse Present: Heather Roberts, RN PT Present: Cameron Sprang, PT OT Present: Benay Pillow, OT PPS Coordinator present : Daiva Nakayama, RN, CRRN        Current Status/Progress  Goal  Weekly Team Focus   Medical     Severe sensory loss on left side, poor awareness of joint position on left side, chronic anxiety and depression   Home discharge with medical stability   Increase awareness of left side   Bowel/Bladder     Continent to bowel and bladder.  To continue continent to bladder and bowel.   To monitor bladder and bowel function Q shift.    Swallow/Nutrition/ Hydration       NA         ADL's     bathing-mod A; UB dressing-min A; LB dressing-max A; functional transfers-max A; toileting-max A  supervision overall; LB dressing-min A   activity tolerance, functional transfers, standing balance, LUE NMR, BADLs   Mobility     Pt currently max A for bed mobility, max A for functional transfers, max A for standing with +2 needed for more dynamic tasks, +2 A for gait.  Pt extremely limited by decreased sensation and proprioception in LUE/LE.   S overall  LUE/LE NMR, transfers, standing balance, gait, pt/family education   Communication               Safety/Cognition/ Behavioral Observations       To keep pt. free from falls during her stay in rehab.   To keep hour rounding to assure pt. safety.    Pain     No complain of pain.  To keep pain levels less than 3,on scale 1 to 10.  To monitor pain levels Q 2 hrs. and PRN.   Skin     Pt. has several generalized bruises.   To keep skin free of pressure ulcers.   To monitor skin Q. shift and PRN      *See Care Plan and progress notes for long and  short-term goals.    Barriers to Discharge:  See above     Possible Resolutions to Barriers:   See above     Discharge Planning/Teaching Needs:   Home with husband who will take a FMLA to provide care to pt at discharge       Team Discussion:    Ankle instability order for air cast for support in therapies.  Self awareness poor.  Currently plus 2 assist-goals for  supervision/min level. Husband to take FMLA to provide care to pt at home.    Revisions to Treatment Plan:    None    Continued Need for Acute Rehabilitation Level of Care: The patient requires daily medical management by a physician with specialized training in physical medicine and rehabilitation for the following conditions: Daily direction of a multidisciplinary physical rehabilitation program to ensure safe treatment while eliciting the highest outcome that is of practical value to the patient.: Yes Daily medical management of patient stability for increased activity during participation in an intensive rehabilitation regime.: Yes Daily analysis of laboratory values and/or radiology reports with any subsequent need for medication adjustment of medical intervention for : Neurological problems;Other  [name] 01/29/2015, 9:54 AM                  Patient ID: [name], female   DOB: [birth year], 69 y.o.   MRN: [id number]

## 2015-02-12 ENCOUNTER — Inpatient Hospital Stay (HOSPITAL_COMMUNITY): Payer: Medicare Other | Admitting: Rehabilitation

## 2015-02-12 ENCOUNTER — Inpatient Hospital Stay (HOSPITAL_COMMUNITY): Payer: Medicare Other | Admitting: Occupational Therapy

## 2015-02-12 MED ORDER — SENNOSIDES-DOCUSATE SODIUM 8.6-50 MG PO TABS: 1.0000 | ORAL_TABLET | Freq: Two times a day (BID) | ORAL | Status: DC

## 2015-02-12 NOTE — Progress Notes (Signed)
Social Work Discharge Note Discharge Note  The overall goal for the admission was met for:   Discharge location: Yes-HOME WITH HUSBAND WHO CAN PROVIDE 24 HR CARE  Length of Stay: Yes-21 DAYS  Discharge activity level: Yes-MIN LEVEL  Home/community participation: Yes  Services provided included: MD, RD, PT, OT, SLP, RN, CM, TR, Pharmacy, Neuropsych and SW  Financial Services: Medicare and Private Insurance: [place]  Follow-up services arranged: Home Health: [facility]-PT,OT,RN,SP, DME: [place] and Patient/Family has no preference for HH/DME agencies  Comments (or additional information):HUSBAND WAS HERE FOR TWO DAYS TO LEARN PT'S CARE AND [place]. HUSBAND AWARE OF PT'S IMPULSIVITY AND WILL BE RIGHT THERE WITH HER.  PT AWARE TO WAIT FOR HUSBAND BEFORE SHE STARTS TRANSFERRING.  BOTH AWARE OF HER HIGH RISK TO FALL AT HOME.  Patient/Family verbalized understanding of follow-up arrangements: Yes  Individual responsible for coordination of the follow-up plan: BOBBY-HUSBAND  Confirmed correct DME delivered: [name] 02/12/2015    [name]

## 2015-02-12 NOTE — Progress Notes (Signed)
Physical Therapy Discharge Summary  Patient Details  Name: Diane Hutchinson MRN: [id number] Date of Birth: 16-Apr-1946   Patient has met 7 of 9 long term goals due to improved activity tolerance, improved balance, improved postural control, increased strength, ability to compensate for deficits, functional use of  left upper extremity and left lower extremity, improved attention, improved awareness and improved coordination.  Patient to discharge at an ambulatory level [place].   Patient's care partner is independent to provide the necessary physical and cognitive assistance at discharge.  Reasons goals not met: Did not meet w/c goals as she continued to need min A.   Recommendation:  Patient will benefit from ongoing skilled PT services in home health setting to continue to advance safe functional mobility, address ongoing impairments in decreased balance, decreased safety, decreased proprioception, ataxia, and minimize fall risk.  Equipment: w/c, cushion, RW, HO, L AFO  Reasons for discharge: treatment goals met and discharge from hospital  Patient/family agrees with progress made and goals achieved: Yes  PT Discharge Precautions/Restrictions Precautions Precautions: Fall Precaution Comments: L inattention Required Braces or Orthoses: Other Brace/Splint Other Brace/Splint: left ankle orthosis  Pain Pain Assessment Pain Assessment: No/denies pain Pain Score: 0-No pain    Cognition Overall Cognitive Status: Within Functional Limits for tasks assessed Arousal/Alertness: Awake/alert Orientation Level: Oriented X4 Sensation Sensation Light Touch: Impaired Detail Light Touch Impaired Details: Impaired LUE;Impaired LLE Stereognosis: Impaired Detail Stereognosis Impaired Details: Impaired LUE;Impaired LLE Hot/Cold: Not tested Proprioception: Not tested Proprioception Impaired Details: Impaired LLE;Impaired LUE Coordination Gross Motor Movements are Fluid and  Coordinated: No Fine Motor Movements are Fluid and Coordinated: No Coordination and Movement Description: patient able to actively perform tasks with left UE and LE but continues to lack control. Heel Shin Test: Patient slower with left LE Motor  Motor Motor: Hemiplegia;Ataxia Motor - Discharge Observations: decreased proprioception, decreased balance  Mobility Bed Mobility Supine to Sit: 5: Supervision Supine to Sit Details: Verbal cues for precautions/safety Transfers Sit to Stand: 5: Supervision Sit to Stand Details: Verbal cues for precautions/safety Stand to Sit: 5: Supervision Stand to Sit Details (indicate cue type and reason): Verbal cues for precautions/safety Stand Pivot Transfers: 5: Supervision Stand Pivot Transfer Details: Verbal cues for precautions/safety Locomotion  Ambulation Ambulation: Yes Ambulation/Gait Assistance: 4: Min assist Ambulation Distance (Feet): 125 Feet Assistive device: Rolling walker;Other (Comment) (hand orthosis on walker and left ankle orthosis) Gait Gait: Yes Gait Pattern: Impaired Gait Pattern: Step-through pattern;Decreased step length - left;Decreased stance time - left;Left flexed knee in stance;Decreased weight shift to left;Trunk flexed;Ataxic Stairs / Additional Locomotion Stairs: Yes Stairs Assistance: 4: Min assist Stair Management Technique: One rail Right;Step to pattern Ramp: 4: Min assist Curb: 4: Wellsite geologist: Yes Wheelchair Assistance: 5: Investment banker, operational Details: Verbal cues for Information systems manager: Right upper extremity;Both lower extermities Wheelchair Parts Management: Needs assistance Distance: 300  Trunk/Postural Assessment  Cervical Assessment Cervical Assessment: Within Functional Limits Thoracic Assessment Thoracic Assessment: Within Functional Limits Postural Control Postural Control: Deficits on evaluation Protective Responses:  delayed  Balance Balance Balance Assessed: Yes Static Sitting Balance Static Sitting - Balance Support: Feet supported;Right upper extremity supported Static Sitting - Level of Assistance: 6: Modified independent (Device/Increase time) Static Standing Balance Static Standing - Balance Support: During functional activity Static Standing - Level of Assistance: 5: Stand by assistance Dynamic Standing Balance Dynamic Standing - Balance Support: During functional activity Dynamic Standing - Level of Assistance: 4: Min assist Dynamic Standing - Balance Activities:  Lateral lean/weight shifting;Forward lean/weight shifting;Reaching for objects;Reaching across midline Extremity Assessment      RLE Assessment RLE Assessment: Within Functional Limits LLE Assessment LLE Assessment: Exceptions to [facility] LLE Strength LLE Overall Strength: Deficits LLE Overall Strength Comments: grossly 3+/5, but still with decreased proprioception  See FIM for current functional status  [name] 02/12/2015, 4:43 PM

## 2015-02-12 NOTE — Progress Notes (Signed)
Patient and spouse received discharge instructions from Algis Liming, PA-C with verbal understanding. Patient discharged to home with caregiver and belongings.

## 2015-02-12 NOTE — Progress Notes (Signed)
Subjective/Complaints: Feeling well. Very excited to get home and see her dog. Review of Systems - Negative except as above  Objective: Vital Signs: Blood pressure 152/67, pulse 63, temperature 98.4 F (36.9 C), temperature source Oral, resp. rate 18, height 5\' 11"  (1.803 m), weight 78.6 kg (173 lb 4.5 oz), SpO2 94 %. No results found. No results found for this or any previous visit (from the past 72 hour(s)).   HEENT: normal Cardio: RRR and no murmur Resp: CTA B/L and unlabored GI: BS positive and NT, ND Extremity:  Pulses positive and minimal LL Edema Skin:   Bruise multiple bruises LUE>LLE improving Neuro: Alert/Oriented with reasonable insight and awareness. Abnormal Sensory Absent LT and proprio LUE.LLE, Abnormal Motor 4/5 LUE, 4/5 LLE with impaired motor control  Abnormal FMC Ataxic/ dec FMC Musc/Skel:    Left hand without pain during ROM Gen NAD   Assessment/Plan: 1. Functional deficits secondary to Right MCA infarct with left hemiparesis which require 3+ hours per day of interdisciplinary therapy in a comprehensive inpatient rehab setting. Physiatrist is providing close team supervision and 24 hour management of active medical problems listed below. Physiatrist and rehab team continue to assess barriers to discharge/monitor patient progress toward functional and medical goals.  Discharge today. Follow up arranged.  FIM: FIM - Bathing Bathing Steps Patient Completed: Chest, Right Arm, Left Arm, Abdomen, Front perineal area, Buttocks, Right upper leg, Left upper leg, Right lower leg (including foot), Left lower leg (including foot) Bathing: 4: Steadying assist  FIM - Upper Body Dressing/Undressing Upper body dressing/undressing steps patient completed: Thread/unthread right sleeve of pullover shirt/dresss, Thread/unthread left sleeve of pullover shirt/dress, Put head through opening of pull over shirt/dress, Pull shirt over trunk Upper body dressing/undressing: 5:  Supervision: Safety issues/verbal cues FIM - Lower Body Dressing/Undressing Lower body dressing/undressing steps patient completed: Thread/unthread right underwear leg, Thread/unthread left underwear leg, Pull underwear up/down, Thread/unthread right pants leg, Thread/unthread left pants leg, Pull pants up/down, Don/Doff right shoe, Fasten/unfasten right shoe Lower body dressing/undressing: 4: Min-Patient completed 75 plus % of tasks  FIM - Toileting Toileting steps completed by patient: Adjust clothing prior to toileting, Adjust clothing after toileting Toileting Assistive Devices: Grab bar or rail for support Toileting: 3: Mod-Patient completed 2 of 3 steps  FIM - Radio producer Devices: Elevated toilet seat Toilet Transfers: 4-To toilet/BSC: Min A (steadying Pt. > 75%), 4-From toilet/BSC: Min A (steadying Pt. > 75%)  FIM - Bed/Chair Transfer Bed/Chair Transfer Assistive Devices: Copy: 5: Supine > Sit: Supervision (verbal cues/safety issues), 5: Sit > Supine: Supervision (verbal cues/safety issues), 5: Bed > Chair or W/C: Supervision (verbal cues/safety issues), 5: Chair or W/C > Bed: Supervision (verbal cues/safety issues)  FIM - Locomotion: Wheelchair Distance: 300 Locomotion: Wheelchair: 5: Travels 150 ft or more: maneuvers on rugs and over door sills with supervision, cueing or coaxing FIM - Locomotion: Ambulation Locomotion: Ambulation Assistive Devices: Orthosis, Administrator Ambulation/Gait Assistance: 4: Min assist Locomotion: Ambulation: 2: Travels 50 - 149 ft with minimal assistance (Pt.>75%)  Comprehension Comprehension Mode: Auditory Comprehension: 6-Follows complex conversation/direction: With extra time/assistive device  Expression Expression Mode: Verbal Expression Assistive Devices: 6-Talk trach valve Expression: 6-Expresses complex ideas: With extra time/assistive device  Social Interaction Social  Interaction: 6-Interacts appropriately with others with medication or extra time (anti-anxiety, antidepressant).  Problem Solving Problem Solving: 5-Solves basic 90% of the time/requires cueing < 10% of the time  Memory Memory: 5-Recognizes or recalls 90% of the time/requires cueing <  10% of the time  Medical Problem List and Plan: 1. 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  DVT Prophylaxis/Anticoagulation: SQ Lovenox.monitor for any bleeding episodes  -may use TEDS prn 3. Pain Management: tylenol as needed 4. Mood/depression/anxiety: Risperdal 0.5 mg QHS/Desyrel 150 mg QHS,Zoloft 100 mg daily,Xanax 1 mg TID as needed.  5. Neuropsych: This patient is capable of making decisions on her own behalf. 6. Skin/Wound Care: continue observation, appropriate nutrition 7. Fluids/Electrolytes/Nutrition: encourage po 8.Hyperlipidemia.Lipitor/Lovaza 9.  Hypokalemia, supplement   LOS (Days) 21 A FACE TO FACE EVALUATION WAS PERFORMED  Junius Faucett T 02/12/2015, 9:23 AM

## 2015-02-13 DIAGNOSIS — F329 Major depressive disorder, single episode, unspecified: Secondary | ICD-10-CM | POA: Diagnosis not present

## 2015-02-13 DIAGNOSIS — F419 Anxiety disorder, unspecified: Secondary | ICD-10-CM | POA: Diagnosis not present

## 2015-02-13 DIAGNOSIS — I10 Essential (primary) hypertension: Secondary | ICD-10-CM | POA: Diagnosis not present

## 2015-02-13 DIAGNOSIS — E785 Hyperlipidemia, unspecified: Secondary | ICD-10-CM | POA: Diagnosis not present

## 2015-02-13 DIAGNOSIS — I69354 Hemiplegia and hemiparesis following cerebral infarction affecting left non-dominant side: Secondary | ICD-10-CM | POA: Diagnosis not present

## 2015-02-15 ENCOUNTER — Telehealth: Payer: Self-pay | Admitting: *Deleted

## 2015-02-15 ENCOUNTER — Telehealth: Payer: Self-pay | Admitting: Neurology

## 2015-02-15 DIAGNOSIS — I69354 Hemiplegia and hemiparesis following cerebral infarction affecting left non-dominant side: Secondary | ICD-10-CM | POA: Diagnosis not present

## 2015-02-15 DIAGNOSIS — I10 Essential (primary) hypertension: Secondary | ICD-10-CM | POA: Diagnosis not present

## 2015-02-15 DIAGNOSIS — E785 Hyperlipidemia, unspecified: Secondary | ICD-10-CM | POA: Diagnosis not present

## 2015-02-15 DIAGNOSIS — F419 Anxiety disorder, unspecified: Secondary | ICD-10-CM | POA: Diagnosis not present

## 2015-02-15 DIAGNOSIS — F329 Major depressive disorder, single episode, unspecified: Secondary | ICD-10-CM | POA: Diagnosis not present

## 2015-02-15 NOTE — Discharge Summary (Signed)
Discharge summary job [id number]

## 2015-02-15 NOTE — Telephone Encounter (Signed)
Was unable to reach patient. The phone kept ringing and did not go to voicemail.

## 2015-02-15 NOTE — Discharge Summary (Signed)
NAMEMarland Hutchinson  TAHEERA, THOMANN NO.:  [id number]  MEDICAL RECORD NO.:  [id number]  LOCATION:  [id number]                        FACILITY:  [facility]  PHYSICIAN:  Charlett Blake, M.D.DATE OF BIRTH:  1945/12/18  DATE OF ADMISSION:  01/22/2015 DATE OF DISCHARGE:  02/12/2015                              DISCHARGE SUMMARY   DISCHARGE DIAGNOSES: 1. Functional deficits secondary to embolic right brain infarction     after elective left middle cerebral artery embolization procedure. 2. Subcutaneous Lovenox for deep venous thrombosis prophylaxis. 3. Depression. 4. Hyperlipidemia. 5. Hypokalemia, resolved.  HISTORY OF PRESENT ILLNESS:  This is a 69 year old right-handed female with history of hypertension, migraine headaches, and multiple intracranial aneurysms with right MCA aneurysmal clipping in 1993 as well as 2010 without residual weakness.  Independent driving prior to admission, living with her husband.  Presented January 20, 2015 with decreased balance as well as headache.  She had been seen by Interventional Radiology in the past with plan for image-guided cerebral arteriogram for findings of left MCA aneurysm.  She underwent left MCA elective embolization using stent assistance January 20, 2015 per Interventional Radiology.  The patient extubated and noted left-sided weakness.  Code stroke was called.  CT of the head showed no acute stroke.  CTA performed again showing no large vessel occlusion.  She was not a tPA candidate.  Neurology consulted and suspect nondominant right brain infarction embolic secondary to complications of interventional neuroradiology aneurysmal embolization.  Currently, maintained on aspirin and Plavix, subcutaneous Lovenox for DVT prophylaxis.  Bouts of restlessness, agitation resolved.  Physical and occupational therapy ongoing.  The patient was admitted for comprehensive rehab program.  PAST MEDICAL HISTORY:  See discharge diagnoses.  SOCIAL  HISTORY:  Lives with spouse, independent prior to admission.  FUNCTIONAL STATUS:  Upon admission to rehab service is a +2 physical assist stand pivot transfers, mod assist supine to sit, min to mod assist activities of daily living.  PHYSICAL EXAMINATION:  VITAL SIGNS:  Blood pressure 135/71, pulse 70, temperature 98, respirations 18. GENERAL:  This was an alert female, fair, worried of several deficits. LUNGS:  Clear to auscultation. CARDIAC: Regular rate and rhythm. ABDOMEN:  Soft, nontender.  Good bowel sounds. MUSCULOSKELETAL:  Noted left-sided weakness.  REHABILITATION HOSPITAL COURSE:  Patient was admitted to inpatient rehab services with therapies initiated on a 3-hour daily basis consisting of physical therapy, occupational therapy, and rehabilitation nursing.  The following issues were addressed during the patient's rehabilitation stay.  Pertaining to Ms. Diane Hutchinson, functional deficits secondary to embolic right brain infarct after elective MCA embolization procedure remained stable.  She would follow up with interventional Radiology. Subcutaneous Lovenox for DVT prophylaxis.  No bleeding episodes.  She did have a long history of depression, anxiety, maintained with Risperdal, Desyrel, Zoloft as well as Xanax.  She was cooperative with her therapies.  She had some mild hypokalemia resolved with potassium supplement.  She continued on Lipitor and Lovaza for hyperlipidemia. The patient received weekly collaborative interdisciplinary team conferences to discuss estimated length of stay, family teaching, and any barriers to discharge.  She had been fitted with an AFO braces. Supervision, sit-to-stand; supervision supine to sit; min assist, ambulate 125 feet rolling walker. Activities of daily  living, the patient discharged with overall supervision, minimal assist level. Full family teaching was completed.  Plan was to be discharged to home with followup therapies as  advised.  DISCHARGE MEDICATIONS: 1. Fosamax every Monday 70 mg. 2. Xanax 1 mg t.i.d. as needed. 3. Aspirin 325 mg p.o. daily. 4. Lipitor 40 mg p.o. daily. 5. Plavix 75 mg p.o. daily. 6. Fish oil 1 capsule daily. 7. Lopid 600 mg p.o. b.i.d. 8. Risperdal 0.5 mg p.o. at bedtime. 9. Zoloft 200 mg at bedtime. 10.Senokot-S 2 tablets daily as needed. 11.Toprol-XL 25 mg p.o. at bedtime. 12.Trazodone 300 mg p.o. at bedtime. 13.Vitamin D daily.  DIET:  Regular.  FOLLOWUP:  She would follow up with Dr. Alysia Penna at the outpatient rehab center Mar 19, 2015; Dr. Sherrie Sport February 22, 2015; Neurology Services as advised.     Lauraine Rinne, P.A.   ______________________________ Charlett Blake, M.D.    DA/MEDQ  D:  02/15/2015  T:  02/15/2015  Job:  [id number]  cc:   Sarina Ill, MD Charlett Blake, M.D. Stoney Bang, MD

## 2015-02-15 NOTE — Telephone Encounter (Signed)
Patient stated discharge papers she should have a fu with Dr. Jaynee Eagles for Stroke.  Didn't see where patient was seen by Dr. Rexene Alberts at hospital.  Please call and advise.

## 2015-02-16 ENCOUNTER — Telehealth: Payer: Self-pay | Admitting: *Deleted

## 2015-02-16 DIAGNOSIS — F329 Major depressive disorder, single episode, unspecified: Secondary | ICD-10-CM | POA: Diagnosis not present

## 2015-02-16 DIAGNOSIS — I69354 Hemiplegia and hemiparesis following cerebral infarction affecting left non-dominant side: Secondary | ICD-10-CM | POA: Diagnosis not present

## 2015-02-16 DIAGNOSIS — E785 Hyperlipidemia, unspecified: Secondary | ICD-10-CM | POA: Diagnosis not present

## 2015-02-16 DIAGNOSIS — F419 Anxiety disorder, unspecified: Secondary | ICD-10-CM | POA: Diagnosis not present

## 2015-02-16 DIAGNOSIS — I10 Essential (primary) hypertension: Secondary | ICD-10-CM | POA: Diagnosis not present

## 2015-02-16 NOTE — Telephone Encounter (Signed)
Talked with pt and verified with her the appt that were set up for her at discharge from hospital. Those included Dr. Alysia Penna  On Mar 19, 2015 at 2:00pm and Dr. Sherrie Sport on February 22, 2015 at 4:30pm. Pt verbalized understanding.

## 2015-02-17 DIAGNOSIS — E785 Hyperlipidemia, unspecified: Secondary | ICD-10-CM | POA: Diagnosis not present

## 2015-02-17 DIAGNOSIS — F419 Anxiety disorder, unspecified: Secondary | ICD-10-CM | POA: Diagnosis not present

## 2015-02-17 DIAGNOSIS — I69354 Hemiplegia and hemiparesis following cerebral infarction affecting left non-dominant side: Secondary | ICD-10-CM | POA: Diagnosis not present

## 2015-02-17 DIAGNOSIS — F329 Major depressive disorder, single episode, unspecified: Secondary | ICD-10-CM | POA: Diagnosis not present

## 2015-02-17 DIAGNOSIS — I10 Essential (primary) hypertension: Secondary | ICD-10-CM | POA: Diagnosis not present

## 2015-02-18 DIAGNOSIS — I69354 Hemiplegia and hemiparesis following cerebral infarction affecting left non-dominant side: Secondary | ICD-10-CM | POA: Diagnosis not present

## 2015-02-18 DIAGNOSIS — F329 Major depressive disorder, single episode, unspecified: Secondary | ICD-10-CM | POA: Diagnosis not present

## 2015-02-18 DIAGNOSIS — E785 Hyperlipidemia, unspecified: Secondary | ICD-10-CM | POA: Diagnosis not present

## 2015-02-18 DIAGNOSIS — I10 Essential (primary) hypertension: Secondary | ICD-10-CM | POA: Diagnosis not present

## 2015-02-18 DIAGNOSIS — F419 Anxiety disorder, unspecified: Secondary | ICD-10-CM | POA: Diagnosis not present

## 2015-02-22 DIAGNOSIS — I10 Essential (primary) hypertension: Secondary | ICD-10-CM | POA: Diagnosis not present

## 2015-02-22 DIAGNOSIS — E782 Mixed hyperlipidemia: Secondary | ICD-10-CM | POA: Diagnosis not present

## 2015-02-22 DIAGNOSIS — E785 Hyperlipidemia, unspecified: Secondary | ICD-10-CM | POA: Diagnosis not present

## 2015-02-22 DIAGNOSIS — F419 Anxiety disorder, unspecified: Secondary | ICD-10-CM | POA: Diagnosis not present

## 2015-02-22 DIAGNOSIS — I69354 Hemiplegia and hemiparesis following cerebral infarction affecting left non-dominant side: Secondary | ICD-10-CM | POA: Diagnosis not present

## 2015-02-22 DIAGNOSIS — F329 Major depressive disorder, single episode, unspecified: Secondary | ICD-10-CM | POA: Diagnosis not present

## 2015-02-22 DIAGNOSIS — I6350 Cerebral infarction due to unspecified occlusion or stenosis of unspecified cerebral artery: Secondary | ICD-10-CM | POA: Diagnosis not present

## 2015-02-23 DIAGNOSIS — I69354 Hemiplegia and hemiparesis following cerebral infarction affecting left non-dominant side: Secondary | ICD-10-CM | POA: Diagnosis not present

## 2015-02-23 DIAGNOSIS — F329 Major depressive disorder, single episode, unspecified: Secondary | ICD-10-CM | POA: Diagnosis not present

## 2015-02-23 DIAGNOSIS — I10 Essential (primary) hypertension: Secondary | ICD-10-CM | POA: Diagnosis not present

## 2015-02-23 DIAGNOSIS — F419 Anxiety disorder, unspecified: Secondary | ICD-10-CM | POA: Diagnosis not present

## 2015-02-23 DIAGNOSIS — E785 Hyperlipidemia, unspecified: Secondary | ICD-10-CM | POA: Diagnosis not present

## 2015-02-24 DIAGNOSIS — I10 Essential (primary) hypertension: Secondary | ICD-10-CM | POA: Diagnosis not present

## 2015-02-24 DIAGNOSIS — I69354 Hemiplegia and hemiparesis following cerebral infarction affecting left non-dominant side: Secondary | ICD-10-CM | POA: Diagnosis not present

## 2015-02-24 DIAGNOSIS — E785 Hyperlipidemia, unspecified: Secondary | ICD-10-CM | POA: Diagnosis not present

## 2015-02-24 DIAGNOSIS — F329 Major depressive disorder, single episode, unspecified: Secondary | ICD-10-CM | POA: Diagnosis not present

## 2015-02-24 DIAGNOSIS — F419 Anxiety disorder, unspecified: Secondary | ICD-10-CM | POA: Diagnosis not present

## 2015-02-25 ENCOUNTER — Other Ambulatory Visit (HOSPITAL_COMMUNITY): Payer: Self-pay | Admitting: Interventional Radiology

## 2015-02-25 DIAGNOSIS — F419 Anxiety disorder, unspecified: Secondary | ICD-10-CM | POA: Diagnosis not present

## 2015-02-25 DIAGNOSIS — I671 Cerebral aneurysm, nonruptured: Secondary | ICD-10-CM

## 2015-02-25 DIAGNOSIS — E785 Hyperlipidemia, unspecified: Secondary | ICD-10-CM | POA: Diagnosis not present

## 2015-02-25 DIAGNOSIS — I10 Essential (primary) hypertension: Secondary | ICD-10-CM | POA: Diagnosis not present

## 2015-02-25 DIAGNOSIS — F329 Major depressive disorder, single episode, unspecified: Secondary | ICD-10-CM | POA: Diagnosis not present

## 2015-02-25 DIAGNOSIS — I69354 Hemiplegia and hemiparesis following cerebral infarction affecting left non-dominant side: Secondary | ICD-10-CM | POA: Diagnosis not present

## 2015-02-26 DIAGNOSIS — I10 Essential (primary) hypertension: Secondary | ICD-10-CM | POA: Diagnosis not present

## 2015-02-26 DIAGNOSIS — I69354 Hemiplegia and hemiparesis following cerebral infarction affecting left non-dominant side: Secondary | ICD-10-CM | POA: Diagnosis not present

## 2015-02-26 DIAGNOSIS — F419 Anxiety disorder, unspecified: Secondary | ICD-10-CM | POA: Diagnosis not present

## 2015-02-26 DIAGNOSIS — F329 Major depressive disorder, single episode, unspecified: Secondary | ICD-10-CM | POA: Diagnosis not present

## 2015-02-26 DIAGNOSIS — E785 Hyperlipidemia, unspecified: Secondary | ICD-10-CM | POA: Diagnosis not present

## 2015-03-01 ENCOUNTER — Other Ambulatory Visit: Payer: Self-pay | Admitting: Radiology

## 2015-03-01 ENCOUNTER — Ambulatory Visit (HOSPITAL_COMMUNITY)
Admission: RE | Admit: 2015-03-01 | Discharge: 2015-03-01 | Disposition: A | Discharge status: Home / Self Care | Payer: Medicare Other | Source: Ambulatory Visit | Attending: Interventional Radiology | Admitting: Interventional Radiology

## 2015-03-01 DIAGNOSIS — Z7902 Long term (current) use of antithrombotics/antiplatelets: Secondary | ICD-10-CM | POA: Diagnosis not present

## 2015-03-01 DIAGNOSIS — I6340 Cerebral infarction due to embolism of unspecified cerebral artery: Secondary | ICD-10-CM | POA: Diagnosis not present

## 2015-03-01 DIAGNOSIS — Z9889 Other specified postprocedural states: Secondary | ICD-10-CM | POA: Diagnosis not present

## 2015-03-01 DIAGNOSIS — I671 Cerebral aneurysm, nonruptured: Secondary | ICD-10-CM | POA: Insufficient documentation

## 2015-03-01 DIAGNOSIS — Z7982 Long term (current) use of aspirin: Secondary | ICD-10-CM | POA: Insufficient documentation

## 2015-03-01 LAB — PLATELET INHIBITION P2Y12: Platelet Function  P2Y12: 6 [PRU] — ABNORMAL LOW (ref 194–418)

## 2015-03-02 ENCOUNTER — Telehealth (HOSPITAL_COMMUNITY): Payer: Self-pay | Admitting: Interventional Radiology

## 2015-03-02 DIAGNOSIS — F419 Anxiety disorder, unspecified: Secondary | ICD-10-CM | POA: Diagnosis not present

## 2015-03-02 DIAGNOSIS — I10 Essential (primary) hypertension: Secondary | ICD-10-CM | POA: Diagnosis not present

## 2015-03-02 DIAGNOSIS — E785 Hyperlipidemia, unspecified: Secondary | ICD-10-CM | POA: Diagnosis not present

## 2015-03-02 DIAGNOSIS — I69354 Hemiplegia and hemiparesis following cerebral infarction affecting left non-dominant side: Secondary | ICD-10-CM | POA: Diagnosis not present

## 2015-03-02 DIAGNOSIS — T1490 Injury, unspecified: Secondary | ICD-10-CM | POA: Diagnosis not present

## 2015-03-02 DIAGNOSIS — F329 Major depressive disorder, single episode, unspecified: Secondary | ICD-10-CM | POA: Diagnosis not present

## 2015-03-02 NOTE — Telephone Encounter (Signed)
Called pt, told her to continue on the Aspirin as is but to change her Plavix 75mg  to 1/2 tablet QD per Dr. Estanislado Pandy. She states understanding and is in agreement with this plan. JM

## 2015-03-03 DIAGNOSIS — F419 Anxiety disorder, unspecified: Secondary | ICD-10-CM | POA: Diagnosis not present

## 2015-03-03 DIAGNOSIS — I69354 Hemiplegia and hemiparesis following cerebral infarction affecting left non-dominant side: Secondary | ICD-10-CM | POA: Diagnosis not present

## 2015-03-03 DIAGNOSIS — E785 Hyperlipidemia, unspecified: Secondary | ICD-10-CM | POA: Diagnosis not present

## 2015-03-03 DIAGNOSIS — I10 Essential (primary) hypertension: Secondary | ICD-10-CM | POA: Diagnosis not present

## 2015-03-03 DIAGNOSIS — F329 Major depressive disorder, single episode, unspecified: Secondary | ICD-10-CM | POA: Diagnosis not present

## 2015-03-04 ENCOUNTER — Ambulatory Visit (INDEPENDENT_AMBULATORY_CARE_PROVIDER_SITE_OTHER): Payer: Medicare Other | Admitting: Neurology

## 2015-03-04 ENCOUNTER — Encounter: Payer: Self-pay | Admitting: Neurology

## 2015-03-04 VITALS — BP 149/71 | HR 49 | Ht 66.5 in | Wt 175.0 lb

## 2015-03-04 DIAGNOSIS — E785 Hyperlipidemia, unspecified: Secondary | ICD-10-CM | POA: Diagnosis not present

## 2015-03-04 DIAGNOSIS — I10 Essential (primary) hypertension: Secondary | ICD-10-CM | POA: Diagnosis not present

## 2015-03-04 DIAGNOSIS — I63132 Cerebral infarction due to embolism of left carotid artery: Secondary | ICD-10-CM | POA: Diagnosis not present

## 2015-03-04 DIAGNOSIS — I671 Cerebral aneurysm, nonruptured: Secondary | ICD-10-CM

## 2015-03-04 DIAGNOSIS — Z8679 Personal history of other diseases of the circulatory system: Secondary | ICD-10-CM

## 2015-03-04 DIAGNOSIS — I639 Cerebral infarction, unspecified: Secondary | ICD-10-CM | POA: Diagnosis not present

## 2015-03-04 NOTE — Patient Instructions (Signed)
-   continue ASA 325 mg and plavix 1/2 tab (37.5mg ) daily for stroke prevention and for stent management - continue to follow up with Dr. Estanislado Pandy for the 2 stage of left MCA aneurysm - continue lipitor for stroke prevention - check BP at home - Follow up with your primary care physician for stroke risk factor modification. Recommend maintain blood pressure goal <130/80, diabetes with hemoglobin A1c goal below 6.5% and lipids with LDL cholesterol goal below 70 mg/dL.  - continue to abstain from smoking - aggressive with PT/OT and do your own exercise at home - follow up in 3 months.

## 2015-03-04 NOTE — Progress Notes (Signed)
STROKE NEUROLOGY FOLLOW UP NOTE  NAME: Diane Hutchinson DOB: [birth year]  REASON FOR VISIT: stroke follow up HISTORY FROM: husband and chart  Today we had the pleasure of seeing Diane Hutchinson in follow-up at our Neurology Clinic. Pt was accompanied by husband.   History Summary Diane Hutchinson is a 69 y.o. 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 Jaynee Eagles on 11/24/14 for clearance for right breast elective surgery. As per chart, she was admitted to [facility] on 11/08/2009 with severe headache, nausea and vomiting and CT scan showed SAH. Cerebral angiogram revealed a right PICA aneurysm, left MCA aneurysm and right MCA aneurysm s/p clipping in 1993. The right PICA aneurysm was coiled by Dr. Patrecia Pour. However, the left MCA aneurysm still left without treated. Due to higher risk of rupture during general anesthesia, Dr. Jaynee Eagles performed CTA head and referred pt to Dr. Estanislado Pandy for management of left MCA aneurysm.  On 01/22/15, pt had first step of stent assisted left widenecked MCA aneurysm procedure. After the procedure, pt developed left UE and LE numbness, tingling, weakness and ataxia. Pt not a tPA candidate secondary to being on IV heparin drip and elevated APTT. She can not have MRI. Repeat CT confirmed right thalamus/posterior IC, considering related to procedure. She was continued on ASA 325 and plavix 75 as well as lipitor 40. She was also recommended to quit smoking. She was sent to CIR after discharge.  She has hx of HLD and on lipitor and lopid since 1993. Hx of HTN and taking metoprolol.  Interval History During the interval time, the patient has been doing relatively stable. She finished CIR and discharged home with home therapy 3 times a week. She has quit smoking since March admission. She still has  left sided weakness but slow improving. She will have the 2nd stage of left MCA aneurysm treatment in about 2 months with Dr. Estanislado Pandy. After that, she will decide on the elective right breast procedure (not cancer as per pt). Her BP 149/71 but she said at home the therapist checked on her, it was always at 120s. Her platelet function assay was quite low on last check, therefore her plavix was decreased to half tablets.  REVIEW OF SYSTEMS: Full 14 system review of systems performed and notable only for those listed below and in HPI above, all others are negative:  Constitutional:   Cardiovascular: swelling in legs Ear/Nose/Throat:   Skin:  Eyes:   Respiratory:   Gastroitestinal:   Genitourinary:  Hematology/Lymphatic:   Endocrine: feeling cold Musculoskeletal:  Joint swelling Allergy/Immunology:   Neurological:  Weakness, numbness Psychiatric: depression, anxiety Sleep:   The following represents the patient's updated allergies and side effects list: Allergies  Allergen Reactions  . Codeine Other (See Comments)    dellusions  . Meperidine Hcl Other (See Comments)    Hurts stomach  . Morphine Other (See Comments)    dellusions  . Sulfonamide Derivatives Other (See Comments)    Drives her nuts    The neurologically relevant items on the patient's problem list were reviewed on today's visit.  Neurologic Examination  A problem focused neurological exam (12 or more points of the single system neurologic examination, vital signs counts as 1 point, cranial nerves count for 8 points) was performed.  Blood pressure 149/71, pulse 49, height 5' 6.5" (  1.689 m), weight 175 lb (79.379 kg).  General - Well nourished, well developed, in no apparent distress.  Ophthalmologic - fundi not visualized due to eye movement.  Cardiovascular - Regular rate and rhythm with no murmur.  Mental Status -  Level of arousal and orientation to time, place, and person were intact. Language including  expression, naming, repetition, comprehension was assessed and found intact. Fund of Knowledge was assessed and was intact.  Cranial Nerves II - XII - II - Visual field intact OU. III, IV, VI - Extraocular movements intact. V - Facial sensation decreased on the left. VII - Facial movement intact bilaterally. VIII - Hearing & vestibular intact bilaterally. X - Palate elevates symmetrically, dysarthria. XI - Chin turning & shoulder shrug intact bilaterally. XII - Tongue protrusion intact.  Motor Strength - The patient's strength was 3/5 LUE proximal and distal, 3/5 LLE proximal and distal and pronator drift was present on the left.  Bulk was normal and fasciculations were absent.   Motor Tone - Muscle tone was assessed at the neck and appendages and was normal.  Reflexes - The patient's reflexes were normal in all extremities and she had no pathological reflexes.  Sensory - Light touch, temperature/pinprick were assessed and were decreased on the left.    Coordination - The patient had ataxia on the left hand but not out of proportion to the weakness.  Tremor was absent.  Gait and Station - in wheelchair, not able to test due to weakness.  Data reviewed: I personally reviewed the images and agree with the radiology interpretations.  CTA head 12/03/14 - 1. Prior right MCA bifurcation aneurysm clipping and prior right PICA aneurysm coiling without evidence of residual/recurrent aneurysm. 2. Unchanged 4.5 mm left MCA bifurcation aneurysm. 3. No acute intracranial abnormality.  CTA head and neck 01/20/15 -  Pipeline stent placed in the left MCA spanning a left MCA aneurysm. No complications seen relative to that. No missing vessels. No hemorrhage. One could question mild spasm of the MCA branch just beyond the end of the stent, but the vessel is widely patent beyond that and appears as it did before. Previously clipped right MCA region aneurysm. No missing vessels demonstrated on the  right compared to the study of 12/03/2014. 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  CT head 01/22/15 -  Changes consistent with prior aneurysm coiling, clipping and stenting as described. New rounded area of decreased attenuation in the right thalamus laterally consistent with an evolving area of ischemia.  2D echo - Normal LV size and systolic function, EF 81-77%. Normal RV size and systolic function. No significant valvular abnormalities. Mild LAE.  Component     Latest Ref Rng 01/21/2015 01/22/2015  Cholesterol     0 - 200 mg/dL  116  Triglycerides     <150 mg/dL  146  HDL Cholesterol     >39 mg/dL  34 (L)  Total CHOL/HDL Ratio       3.4  VLDL     0 - 40 mg/dL  29  LDL (calc)     0 - 99 mg/dL  53  Hemoglobin A1C     4.8 - 5.6 % 6.0 (H)   Mean Plasma Glucose      126     Assessment: As you may recall, she is a 69 y.o. Caucasian female with PMH of HTN, HLD, right MCA aneurysm s/p clippering in 1993, [place] with right PICA ruptured aneurysm s/p coiling on 11/08/2009  was admitted for left MCA aneurysm management with pipe-line stent, found to have right thalamic/posterior IC stroke post procedure. Pt stroke risk factor including smoker, hx of HTN and HLD, but LDL controlled well on meds. Her stroke still most likely due to procedure, especially posterior IC stroke may due to embolic source from AchA coming out directly from ICA. Will continue ASA 325mg  with half tab plvix (due to low platelet functional assay) as well as lipitor. Follow up with Dr. Estanislado Pandy for further management of left MCA aneurysm.  Plan:   - continue ASA 325 mg and plavix37.5mg  for stroke prevention and for stent management - continue to follow up with Dr. Estanislado Pandy for management of left MCA aneurysm - continue lipitor for stroke prevention - check BP at home - Follow up with your primary care physician for stroke risk factor modification. Recommend maintain blood  pressure goal <130/80, diabetes with hemoglobin A1c goal below 6.5% and lipids with LDL cholesterol goal below 70 mg/dL.  - continue to abstain from smoking - aggressive with PT/OT and recommend home exercise - RTC in 3 months.   No orders of the defined types were placed in this encounter.    Meds ordered this encounter  Medications  . meloxicam (MOBIC) 15 MG tablet    Sig: Take 15 mg by mouth daily.    Refill:  0  . acetaminophen (TYLENOL) 500 MG tablet    Sig: Take 500 mg by mouth every 4 (four) hours as needed.    Patient Instructions  - continue ASA 325 mg and plavix 1/2 tab (37.5mg ) daily for stroke prevention and for stent management - continue to follow up with Dr. Estanislado Pandy for the 2 stage of left MCA aneurysm - continue lipitor for stroke prevention - check BP at home - Follow up with your primary care physician for stroke risk factor modification. Recommend maintain blood pressure goal <130/80, diabetes with hemoglobin A1c goal below 6.5% and lipids with LDL cholesterol goal below 70 mg/dL.  - continue to abstain from smoking - aggressive with PT/OT and do your own exercise at home - follow up in 3 months.  Rosalin Hawking, MD PhD [facility] [address] [place], [place] [address] [phone]

## 2015-03-05 DIAGNOSIS — F419 Anxiety disorder, unspecified: Secondary | ICD-10-CM | POA: Diagnosis not present

## 2015-03-05 DIAGNOSIS — F329 Major depressive disorder, single episode, unspecified: Secondary | ICD-10-CM | POA: Diagnosis not present

## 2015-03-05 DIAGNOSIS — I69354 Hemiplegia and hemiparesis following cerebral infarction affecting left non-dominant side: Secondary | ICD-10-CM | POA: Diagnosis not present

## 2015-03-05 DIAGNOSIS — I10 Essential (primary) hypertension: Secondary | ICD-10-CM | POA: Diagnosis not present

## 2015-03-05 DIAGNOSIS — E785 Hyperlipidemia, unspecified: Secondary | ICD-10-CM | POA: Diagnosis not present

## 2015-03-08 DIAGNOSIS — I69354 Hemiplegia and hemiparesis following cerebral infarction affecting left non-dominant side: Secondary | ICD-10-CM | POA: Diagnosis not present

## 2015-03-08 DIAGNOSIS — I10 Essential (primary) hypertension: Secondary | ICD-10-CM | POA: Diagnosis not present

## 2015-03-08 DIAGNOSIS — F419 Anxiety disorder, unspecified: Secondary | ICD-10-CM | POA: Diagnosis not present

## 2015-03-08 DIAGNOSIS — E785 Hyperlipidemia, unspecified: Secondary | ICD-10-CM | POA: Diagnosis not present

## 2015-03-08 DIAGNOSIS — F329 Major depressive disorder, single episode, unspecified: Secondary | ICD-10-CM | POA: Diagnosis not present

## 2015-03-10 DIAGNOSIS — F419 Anxiety disorder, unspecified: Secondary | ICD-10-CM | POA: Diagnosis not present

## 2015-03-10 DIAGNOSIS — F329 Major depressive disorder, single episode, unspecified: Secondary | ICD-10-CM | POA: Diagnosis not present

## 2015-03-10 DIAGNOSIS — I10 Essential (primary) hypertension: Secondary | ICD-10-CM | POA: Diagnosis not present

## 2015-03-10 DIAGNOSIS — E785 Hyperlipidemia, unspecified: Secondary | ICD-10-CM | POA: Diagnosis not present

## 2015-03-10 DIAGNOSIS — I69354 Hemiplegia and hemiparesis following cerebral infarction affecting left non-dominant side: Secondary | ICD-10-CM | POA: Diagnosis not present

## 2015-03-11 DIAGNOSIS — I69354 Hemiplegia and hemiparesis following cerebral infarction affecting left non-dominant side: Secondary | ICD-10-CM | POA: Diagnosis not present

## 2015-03-11 DIAGNOSIS — E785 Hyperlipidemia, unspecified: Secondary | ICD-10-CM | POA: Diagnosis not present

## 2015-03-11 DIAGNOSIS — I10 Essential (primary) hypertension: Secondary | ICD-10-CM | POA: Diagnosis not present

## 2015-03-11 DIAGNOSIS — F329 Major depressive disorder, single episode, unspecified: Secondary | ICD-10-CM | POA: Diagnosis not present

## 2015-03-11 DIAGNOSIS — F419 Anxiety disorder, unspecified: Secondary | ICD-10-CM | POA: Diagnosis not present

## 2015-03-15 ENCOUNTER — Telehealth: Payer: Self-pay | Admitting: *Deleted

## 2015-03-15 DIAGNOSIS — E785 Hyperlipidemia, unspecified: Secondary | ICD-10-CM | POA: Diagnosis not present

## 2015-03-15 DIAGNOSIS — F419 Anxiety disorder, unspecified: Secondary | ICD-10-CM | POA: Diagnosis not present

## 2015-03-15 DIAGNOSIS — I10 Essential (primary) hypertension: Secondary | ICD-10-CM | POA: Diagnosis not present

## 2015-03-15 DIAGNOSIS — F329 Major depressive disorder, single episode, unspecified: Secondary | ICD-10-CM | POA: Diagnosis not present

## 2015-03-15 DIAGNOSIS — I69354 Hemiplegia and hemiparesis following cerebral infarction affecting left non-dominant side: Secondary | ICD-10-CM | POA: Diagnosis not present

## 2015-03-15 NOTE — Telephone Encounter (Signed)
Magda Paganini PT [facility] called to get a verbal order to extend PT visits.  Approval given and they will faxe orders to be signed.

## 2015-03-16 DIAGNOSIS — F329 Major depressive disorder, single episode, unspecified: Secondary | ICD-10-CM | POA: Diagnosis not present

## 2015-03-16 DIAGNOSIS — E785 Hyperlipidemia, unspecified: Secondary | ICD-10-CM | POA: Diagnosis not present

## 2015-03-16 DIAGNOSIS — F419 Anxiety disorder, unspecified: Secondary | ICD-10-CM | POA: Diagnosis not present

## 2015-03-16 DIAGNOSIS — I10 Essential (primary) hypertension: Secondary | ICD-10-CM | POA: Diagnosis not present

## 2015-03-16 DIAGNOSIS — I69354 Hemiplegia and hemiparesis following cerebral infarction affecting left non-dominant side: Secondary | ICD-10-CM | POA: Diagnosis not present

## 2015-03-17 DIAGNOSIS — F329 Major depressive disorder, single episode, unspecified: Secondary | ICD-10-CM | POA: Diagnosis not present

## 2015-03-17 DIAGNOSIS — I10 Essential (primary) hypertension: Secondary | ICD-10-CM | POA: Diagnosis not present

## 2015-03-17 DIAGNOSIS — F419 Anxiety disorder, unspecified: Secondary | ICD-10-CM | POA: Diagnosis not present

## 2015-03-17 DIAGNOSIS — I69354 Hemiplegia and hemiparesis following cerebral infarction affecting left non-dominant side: Secondary | ICD-10-CM | POA: Diagnosis not present

## 2015-03-17 DIAGNOSIS — E785 Hyperlipidemia, unspecified: Secondary | ICD-10-CM | POA: Diagnosis not present

## 2015-03-18 DIAGNOSIS — F419 Anxiety disorder, unspecified: Secondary | ICD-10-CM | POA: Diagnosis not present

## 2015-03-18 DIAGNOSIS — I69354 Hemiplegia and hemiparesis following cerebral infarction affecting left non-dominant side: Secondary | ICD-10-CM | POA: Diagnosis not present

## 2015-03-18 DIAGNOSIS — E785 Hyperlipidemia, unspecified: Secondary | ICD-10-CM | POA: Diagnosis not present

## 2015-03-18 DIAGNOSIS — F329 Major depressive disorder, single episode, unspecified: Secondary | ICD-10-CM | POA: Diagnosis not present

## 2015-03-18 DIAGNOSIS — I10 Essential (primary) hypertension: Secondary | ICD-10-CM | POA: Diagnosis not present

## 2015-03-19 ENCOUNTER — Encounter: Payer: Self-pay | Admitting: Physical Medicine & Rehabilitation

## 2015-03-19 ENCOUNTER — Encounter: Payer: Medicare Other | Attending: Physical Medicine & Rehabilitation

## 2015-03-19 ENCOUNTER — Ambulatory Visit (HOSPITAL_BASED_OUTPATIENT_CLINIC_OR_DEPARTMENT_OTHER): Payer: Medicare Other | Admitting: Physical Medicine & Rehabilitation

## 2015-03-19 VITALS — BP 154/79 | HR 55 | Resp 14

## 2015-03-19 DIAGNOSIS — R270 Ataxia, unspecified: Secondary | ICD-10-CM

## 2015-03-19 DIAGNOSIS — I69898 Other sequelae of other cerebrovascular disease: Secondary | ICD-10-CM | POA: Diagnosis not present

## 2015-03-19 DIAGNOSIS — Z87891 Personal history of nicotine dependence: Secondary | ICD-10-CM | POA: Diagnosis not present

## 2015-03-19 DIAGNOSIS — R208 Other disturbances of skin sensation: Secondary | ICD-10-CM

## 2015-03-19 DIAGNOSIS — I69398 Other sequelae of cerebral infarction: Secondary | ICD-10-CM

## 2015-03-19 DIAGNOSIS — R209 Unspecified disturbances of skin sensation: Principal | ICD-10-CM

## 2015-03-19 DIAGNOSIS — I639 Cerebral infarction, unspecified: Secondary | ICD-10-CM | POA: Diagnosis not present

## 2015-03-19 NOTE — Progress Notes (Signed)
Subjective:    Patient ID: Diane Hutchinson, female    DOB: February 20, 1946, 69 y.o.   MRN: [id number] 69 year old right-handed female with history of hypertension, migraine headaches, and multiple intracranial aneurysms with right MCA aneurysmal clipping in 1993 as well as 2010 without residual weakness.  Independent driving prior to admission, living with her husband.  Presented January 20, 2015 with decreased balance as well as headache.  She had been seen by Interventional Radiology in the past with plan for image-guided cerebral arteriogram for findings of left MCA aneurysm.  She underwent left MCA elective embolization using stent assistance January 20, 2015 per Interventional Radiology.  The patient extubated and noted left-sided weakness.  Code stroke was called.  CT of the head showed no acute stroke.  CTA performed again showing no large vessel occlusion.  She was not a tPA candidate.  Neurology consulted and suspect nondominant right brain infarction embolic secondary to complications of interventional neuroradiology aneurysmal embolization.  Currently, maintained on aspirin and Plavix, subcutaneous Lovenox for DVT prophylaxis.  Bouts of restlessness, agitation resolved.  Physical and occupational therapy ongoing.  The patient was admitted for comprehensive rehab program.  HPI  HHPT and OT 2x per week each [facility]  Falling Despite walker  and husband assist  Patient continues have difficulty with sensation in the left upper and left lower extremity. She denies any swallowing problems She continues require some assistance with dressing and bathing, unsafe to ambulate without husband assisting her. Pain Inventory Average Pain 0 Pain Right Now 0 My pain is no pain  In the last 24 hours, has pain interfered with the following? General activity 0 Relation with others 0 Enjoyment of life 0 What TIME of day is your pain at its worst? no pain Sleep (in general) varies  Pain is  worse with: no pain Pain improves with: no pain Relief from Meds: no pain  Mobility walk with assistance use a walker needs help with transfers transfers alone  Function disabled: date disabled .  Neuro/Psych weakness numbness tingling trouble walking depression  Prior Studies Any changes since last visit?  no  Physicians involved in your care Any changes since last visit?  no   Family History  Problem Relation Age of Onset  . Stroke Neg Hx    History   Social History  . Marital Status: Married    Spouse Name: N/A  . Number of Children: 1  . Years of Education: GED   Occupational History  . Retired     Social History Main Topics  . Smoking status: Former Smoker -- 1.00 packs/day for 73 years    Quit date: 01/20/2015  . Smokeless tobacco: Never Used  . Alcohol Use: No  . Drug Use: No  . Sexual Activity: Not on file   Other Topics Concern  . None   Social History Narrative   Patient lives at home with husband [name]   Patient has 1 child.    Patient has her GED   Patient is right handed.       Past Surgical History  Procedure Laterality Date  . [place] surgery  2010  . Cholecystectomy      age 60  . Appendectomy      age 7  . Eye surgery  years ago    laser to both eyes for blocked tear ducts   . Brain surgery  1993    aneurysm  . Tubal ligation  years ago  . Hemorroidectomy  years ago  .  Radiology with anesthesia N/A 01/20/2015    Procedure: RADIOLOGY WITH ANESTHESIA;  Surgeon: Luanne Bras, MD;  Location: [facility];  Service: Radiology;  Laterality: N/A;   Past Medical History  Diagnosis Date  . Migraine   . Depression   . Hypertension   . Stroke 2016  . COPD (chronic obstructive pulmonary disease)   . Anxiety     h/o of panic attack  . GERD (gastroesophageal reflux disease)     occas. use of TUMS  . Arthritis     knees   BP 154/79 mmHg  Pulse 55  Resp 14  SpO2 95%  Opioid Risk Score:   Fall Risk Score: Moderate Fall Risk  (6-13 points) (patient previously educated)`1  Depression screen PHQ 2/9  No flowsheet data found.   Review of Systems  Genitourinary: Positive for frequency.  Musculoskeletal: Positive for gait problem.  Neurological: Positive for weakness and numbness.       Tingling  Psychiatric/Behavioral: Positive for dysphoric mood.  All other systems reviewed and are negative.      Objective:   Physical Exam  Constitutional: She is oriented to person, place, and time. She appears well-developed and well-nourished.  HENT:  Head: Normocephalic and atraumatic.  Eyes: EOM are normal. Pupils are equal, round, and reactive to light.  Neurological: She is alert and oriented to person, place, and time.  Psychiatric: She has a normal mood and affect.  Nursing note and vitals reviewed.   4/5 strength in the left deltoid, biceps, triceps, grip, hip flexor, knee extensor, ankle dorsi flexor and plantar flexor Absent light touch and proprioception in the left upper and left lower limb, absent proprioception at the hand wrist as well as elbow Speech without evidence of dysarthria or aphasia       Assessment & Plan:  1. Right MCA distribution infarct with left hemi-sensory deficits causing a sensory ataxia and gait disorder.Transition to outpatient therapy in 2 weeks. Return to clinic one month

## 2015-03-19 NOTE — Patient Instructions (Signed)
Prescription for left KAFO, [facility]

## 2015-03-22 DIAGNOSIS — I10 Essential (primary) hypertension: Secondary | ICD-10-CM | POA: Diagnosis not present

## 2015-03-22 DIAGNOSIS — I69354 Hemiplegia and hemiparesis following cerebral infarction affecting left non-dominant side: Secondary | ICD-10-CM | POA: Diagnosis not present

## 2015-03-22 DIAGNOSIS — F419 Anxiety disorder, unspecified: Secondary | ICD-10-CM | POA: Diagnosis not present

## 2015-03-22 DIAGNOSIS — F329 Major depressive disorder, single episode, unspecified: Secondary | ICD-10-CM | POA: Diagnosis not present

## 2015-03-22 DIAGNOSIS — E785 Hyperlipidemia, unspecified: Secondary | ICD-10-CM | POA: Diagnosis not present

## 2015-03-23 DIAGNOSIS — E785 Hyperlipidemia, unspecified: Secondary | ICD-10-CM | POA: Diagnosis not present

## 2015-03-23 DIAGNOSIS — F329 Major depressive disorder, single episode, unspecified: Secondary | ICD-10-CM | POA: Diagnosis not present

## 2015-03-23 DIAGNOSIS — F419 Anxiety disorder, unspecified: Secondary | ICD-10-CM | POA: Diagnosis not present

## 2015-03-23 DIAGNOSIS — I10 Essential (primary) hypertension: Secondary | ICD-10-CM | POA: Diagnosis not present

## 2015-03-23 DIAGNOSIS — I69354 Hemiplegia and hemiparesis following cerebral infarction affecting left non-dominant side: Secondary | ICD-10-CM | POA: Diagnosis not present

## 2015-03-24 DIAGNOSIS — F419 Anxiety disorder, unspecified: Secondary | ICD-10-CM | POA: Diagnosis not present

## 2015-03-24 DIAGNOSIS — E785 Hyperlipidemia, unspecified: Secondary | ICD-10-CM | POA: Diagnosis not present

## 2015-03-24 DIAGNOSIS — I69354 Hemiplegia and hemiparesis following cerebral infarction affecting left non-dominant side: Secondary | ICD-10-CM | POA: Diagnosis not present

## 2015-03-24 DIAGNOSIS — F329 Major depressive disorder, single episode, unspecified: Secondary | ICD-10-CM | POA: Diagnosis not present

## 2015-03-24 DIAGNOSIS — I10 Essential (primary) hypertension: Secondary | ICD-10-CM | POA: Diagnosis not present

## 2015-03-25 DIAGNOSIS — E785 Hyperlipidemia, unspecified: Secondary | ICD-10-CM | POA: Diagnosis not present

## 2015-03-25 DIAGNOSIS — I69354 Hemiplegia and hemiparesis following cerebral infarction affecting left non-dominant side: Secondary | ICD-10-CM | POA: Diagnosis not present

## 2015-03-25 DIAGNOSIS — F329 Major depressive disorder, single episode, unspecified: Secondary | ICD-10-CM | POA: Diagnosis not present

## 2015-03-25 DIAGNOSIS — F419 Anxiety disorder, unspecified: Secondary | ICD-10-CM | POA: Diagnosis not present

## 2015-03-25 DIAGNOSIS — I10 Essential (primary) hypertension: Secondary | ICD-10-CM | POA: Diagnosis not present

## 2015-03-29 DIAGNOSIS — I10 Essential (primary) hypertension: Secondary | ICD-10-CM | POA: Diagnosis not present

## 2015-03-29 DIAGNOSIS — F419 Anxiety disorder, unspecified: Secondary | ICD-10-CM | POA: Diagnosis not present

## 2015-03-29 DIAGNOSIS — I69354 Hemiplegia and hemiparesis following cerebral infarction affecting left non-dominant side: Secondary | ICD-10-CM | POA: Diagnosis not present

## 2015-03-29 DIAGNOSIS — F329 Major depressive disorder, single episode, unspecified: Secondary | ICD-10-CM | POA: Diagnosis not present

## 2015-03-29 DIAGNOSIS — E785 Hyperlipidemia, unspecified: Secondary | ICD-10-CM | POA: Diagnosis not present

## 2015-03-30 DIAGNOSIS — E785 Hyperlipidemia, unspecified: Secondary | ICD-10-CM | POA: Diagnosis not present

## 2015-03-30 DIAGNOSIS — I10 Essential (primary) hypertension: Secondary | ICD-10-CM | POA: Diagnosis not present

## 2015-03-30 DIAGNOSIS — I69354 Hemiplegia and hemiparesis following cerebral infarction affecting left non-dominant side: Secondary | ICD-10-CM | POA: Diagnosis not present

## 2015-03-30 DIAGNOSIS — F329 Major depressive disorder, single episode, unspecified: Secondary | ICD-10-CM | POA: Diagnosis not present

## 2015-03-30 DIAGNOSIS — F419 Anxiety disorder, unspecified: Secondary | ICD-10-CM | POA: Diagnosis not present

## 2015-03-31 DIAGNOSIS — I69354 Hemiplegia and hemiparesis following cerebral infarction affecting left non-dominant side: Secondary | ICD-10-CM | POA: Diagnosis not present

## 2015-03-31 DIAGNOSIS — E785 Hyperlipidemia, unspecified: Secondary | ICD-10-CM | POA: Diagnosis not present

## 2015-03-31 DIAGNOSIS — I10 Essential (primary) hypertension: Secondary | ICD-10-CM | POA: Diagnosis not present

## 2015-03-31 DIAGNOSIS — F329 Major depressive disorder, single episode, unspecified: Secondary | ICD-10-CM | POA: Diagnosis not present

## 2015-03-31 DIAGNOSIS — F419 Anxiety disorder, unspecified: Secondary | ICD-10-CM | POA: Diagnosis not present

## 2015-04-01 DIAGNOSIS — S32010A Wedge compression fracture of first lumbar vertebra, initial encounter for closed fracture: Secondary | ICD-10-CM | POA: Diagnosis not present

## 2015-04-01 DIAGNOSIS — M549 Dorsalgia, unspecified: Secondary | ICD-10-CM | POA: Diagnosis not present

## 2015-04-01 DIAGNOSIS — F329 Major depressive disorder, single episode, unspecified: Secondary | ICD-10-CM | POA: Diagnosis not present

## 2015-04-01 DIAGNOSIS — M4856XA Collapsed vertebra, not elsewhere classified, lumbar region, initial encounter for fracture: Secondary | ICD-10-CM | POA: Diagnosis not present

## 2015-04-01 DIAGNOSIS — F419 Anxiety disorder, unspecified: Secondary | ICD-10-CM | POA: Diagnosis not present

## 2015-04-01 DIAGNOSIS — I69354 Hemiplegia and hemiparesis following cerebral infarction affecting left non-dominant side: Secondary | ICD-10-CM | POA: Diagnosis not present

## 2015-04-01 DIAGNOSIS — E785 Hyperlipidemia, unspecified: Secondary | ICD-10-CM | POA: Diagnosis not present

## 2015-04-01 DIAGNOSIS — M4854XA Collapsed vertebra, not elsewhere classified, thoracic region, initial encounter for fracture: Secondary | ICD-10-CM | POA: Diagnosis not present

## 2015-04-01 DIAGNOSIS — M8588 Other specified disorders of bone density and structure, other site: Secondary | ICD-10-CM | POA: Diagnosis not present

## 2015-04-01 DIAGNOSIS — I10 Essential (primary) hypertension: Secondary | ICD-10-CM | POA: Diagnosis not present

## 2015-04-05 DIAGNOSIS — I69354 Hemiplegia and hemiparesis following cerebral infarction affecting left non-dominant side: Secondary | ICD-10-CM | POA: Diagnosis not present

## 2015-04-05 DIAGNOSIS — E785 Hyperlipidemia, unspecified: Secondary | ICD-10-CM | POA: Diagnosis not present

## 2015-04-05 DIAGNOSIS — F419 Anxiety disorder, unspecified: Secondary | ICD-10-CM | POA: Diagnosis not present

## 2015-04-05 DIAGNOSIS — F329 Major depressive disorder, single episode, unspecified: Secondary | ICD-10-CM | POA: Diagnosis not present

## 2015-04-05 DIAGNOSIS — I10 Essential (primary) hypertension: Secondary | ICD-10-CM | POA: Diagnosis not present

## 2015-04-06 DIAGNOSIS — E785 Hyperlipidemia, unspecified: Secondary | ICD-10-CM | POA: Diagnosis not present

## 2015-04-06 DIAGNOSIS — F329 Major depressive disorder, single episode, unspecified: Secondary | ICD-10-CM | POA: Diagnosis not present

## 2015-04-06 DIAGNOSIS — F419 Anxiety disorder, unspecified: Secondary | ICD-10-CM | POA: Diagnosis not present

## 2015-04-06 DIAGNOSIS — I69354 Hemiplegia and hemiparesis following cerebral infarction affecting left non-dominant side: Secondary | ICD-10-CM | POA: Diagnosis not present

## 2015-04-06 DIAGNOSIS — I10 Essential (primary) hypertension: Secondary | ICD-10-CM | POA: Diagnosis not present

## 2015-04-08 DIAGNOSIS — F419 Anxiety disorder, unspecified: Secondary | ICD-10-CM | POA: Diagnosis not present

## 2015-04-08 DIAGNOSIS — F329 Major depressive disorder, single episode, unspecified: Secondary | ICD-10-CM | POA: Diagnosis not present

## 2015-04-08 DIAGNOSIS — I69354 Hemiplegia and hemiparesis following cerebral infarction affecting left non-dominant side: Secondary | ICD-10-CM | POA: Diagnosis not present

## 2015-04-08 DIAGNOSIS — I10 Essential (primary) hypertension: Secondary | ICD-10-CM | POA: Diagnosis not present

## 2015-04-08 DIAGNOSIS — E785 Hyperlipidemia, unspecified: Secondary | ICD-10-CM | POA: Diagnosis not present

## 2015-04-09 DIAGNOSIS — I10 Essential (primary) hypertension: Secondary | ICD-10-CM | POA: Diagnosis not present

## 2015-04-09 DIAGNOSIS — E785 Hyperlipidemia, unspecified: Secondary | ICD-10-CM | POA: Diagnosis not present

## 2015-04-09 DIAGNOSIS — F419 Anxiety disorder, unspecified: Secondary | ICD-10-CM | POA: Diagnosis not present

## 2015-04-09 DIAGNOSIS — I69354 Hemiplegia and hemiparesis following cerebral infarction affecting left non-dominant side: Secondary | ICD-10-CM | POA: Diagnosis not present

## 2015-04-09 DIAGNOSIS — F329 Major depressive disorder, single episode, unspecified: Secondary | ICD-10-CM | POA: Diagnosis not present

## 2015-04-13 DIAGNOSIS — F419 Anxiety disorder, unspecified: Secondary | ICD-10-CM | POA: Diagnosis not present

## 2015-04-13 DIAGNOSIS — I10 Essential (primary) hypertension: Secondary | ICD-10-CM | POA: Diagnosis not present

## 2015-04-13 DIAGNOSIS — F329 Major depressive disorder, single episode, unspecified: Secondary | ICD-10-CM | POA: Diagnosis not present

## 2015-04-13 DIAGNOSIS — I69354 Hemiplegia and hemiparesis following cerebral infarction affecting left non-dominant side: Secondary | ICD-10-CM | POA: Diagnosis not present

## 2015-04-13 DIAGNOSIS — E785 Hyperlipidemia, unspecified: Secondary | ICD-10-CM | POA: Diagnosis not present

## 2015-04-16 ENCOUNTER — Encounter: Payer: Self-pay | Admitting: Physical Medicine & Rehabilitation

## 2015-04-16 ENCOUNTER — Ambulatory Visit (HOSPITAL_BASED_OUTPATIENT_CLINIC_OR_DEPARTMENT_OTHER): Payer: Medicare Other | Admitting: Physical Medicine & Rehabilitation

## 2015-04-16 ENCOUNTER — Encounter: Payer: Medicare Other | Attending: Physical Medicine & Rehabilitation

## 2015-04-16 VITALS — BP 124/81 | HR 64 | Resp 14

## 2015-04-16 DIAGNOSIS — G8194 Hemiplegia, unspecified affecting left nondominant side: Secondary | ICD-10-CM

## 2015-04-16 DIAGNOSIS — I69898 Other sequelae of other cerebrovascular disease: Secondary | ICD-10-CM

## 2015-04-16 DIAGNOSIS — Z87891 Personal history of nicotine dependence: Secondary | ICD-10-CM | POA: Insufficient documentation

## 2015-04-16 DIAGNOSIS — I639 Cerebral infarction, unspecified: Secondary | ICD-10-CM | POA: Diagnosis not present

## 2015-04-16 DIAGNOSIS — R270 Ataxia, unspecified: Secondary | ICD-10-CM | POA: Diagnosis not present

## 2015-04-16 DIAGNOSIS — G8190 Hemiplegia, unspecified affecting unspecified side: Secondary | ICD-10-CM | POA: Diagnosis not present

## 2015-04-16 DIAGNOSIS — R208 Other disturbances of skin sensation: Secondary | ICD-10-CM

## 2015-04-16 DIAGNOSIS — R209 Unspecified disturbances of skin sensation: Secondary | ICD-10-CM

## 2015-04-16 DIAGNOSIS — I69398 Other sequelae of cerebral infarction: Secondary | ICD-10-CM

## 2015-04-16 NOTE — Progress Notes (Signed)
Subjective:    Patient ID: Diane Hutchinson, female    DOB: Jun 08, 1946, 69 y.o.   MRN: [id number] 68 y.o. right handed female with history of hypertension, migraine headaches as well as multiple intracranial aneurysms with right MCA aneurysmal clipping 1993 as well as coiling 2010 without residual weakness.. Independent/driving prior to admission living with her husband. Presented 01/20/2015 with decreased balance as well as headache. She had been seen by interventional radiology in the past with plan for image guided cerebral arteriogram for findings a left MCA aneurysm. Underwent left MCA elective embolization using stent assistance 01/20/2015 per Intervention radiology. Patient was extubated noted left-sided tingling and weakness. Code stroke was called. CT of the head showed no acute stroke. CTA was then performed again showing no large vessel occlusion. She was not a TPA candidate. Neurology consulted suspect non-dominant right brain infarct, embolic secondary to complication of interventional neuroradiology aneurysm embolization  HPI Patient had one more fall since my last visit with her. She was seen by PCP, x-ray ordered it sounds like she may have had a compression fracture however do not have any hospital records from [facility] in regards to that. Back pain has subsided She is going to outpatient therapy at [facility] but is not very satisfied with the service. She would like to switch to another physical and occupational therapy clinic  Patient still requires assistance from her husband for dressing and bathing. She has difficulty using the left upper extremity. She is still using AFO for the left lower extremity  Pain Inventory Average Pain 5 Pain Right Now 5 My pain is intermittent  In the last 24 hours, has pain interfered with the following? General activity 0 Relation with others 0 Enjoyment of life 0 What TIME of day is your pain at its worst?  daytime,evening  Sleep (in general) varies  Pain is worse with: some activites Pain improves with: heat/ice Relief from Meds: 0  Mobility walk with assistance use a walker ability to climb steps?  no do you drive?  no use a wheelchair needs help with transfers  Function retired I need assistance with the following:  bathing, toileting and household duties  Neuro/Psych trouble walking  Prior Studies Any changes since last visit?  no  Physicians involved in your care Any changes since last visit?  no   Family History  Problem Relation Age of Onset  . Stroke Neg Hx    History   Social History  . Marital Status: Married    Spouse Name: N/A  . Number of Children: 1  . Years of Education: GED   Occupational History  . Retired     Social History Main Topics  . Smoking status: Former Smoker -- 1.00 packs/day for 51 years    Quit date: 01/20/2015  . Smokeless tobacco: Never Used  . Alcohol Use: No  . Drug Use: No  . Sexual Activity: Not on file   Other Topics Concern  . None   Social History Narrative   Patient lives at home with husband Diane Hutchinson   Patient has 1 child.    Patient has her GED   Patient is right handed.       Past Surgical History  Procedure Laterality Date  . [place] surgery  2010  . Cholecystectomy      age 50  . Appendectomy      age 34  . Eye surgery  years ago    laser to both eyes for blocked tear ducts   .  Brain surgery  1993    aneurysm  . Tubal ligation  years ago  . Hemorroidectomy  years ago  . Radiology with anesthesia N/A 01/20/2015    Procedure: RADIOLOGY WITH ANESTHESIA;  Surgeon: Luanne Bras, MD;  Location: [place];  Service: Radiology;  Laterality: N/A;   Past Medical History  Diagnosis Date  . Migraine   . Depression   . Hypertension   . Stroke 2016  . COPD (chronic obstructive pulmonary disease)   . Anxiety     h/o of panic attack  . GERD (gastroesophageal reflux disease)     occas. use of TUMS  . Arthritis      knees   BP 124/81 mmHg  Pulse 64  Resp 14  SpO2 93%  Opioid Risk Score:   Fall Risk Score: Moderate Fall Risk (6-13 points)`1  Depression screen PHQ 2/9  No flowsheet data found.   Review of Systems  Constitutional: Negative.   HENT: Negative.   Eyes: Negative.   Respiratory: Negative.   Cardiovascular: Negative.   Gastrointestinal: Negative.   Endocrine:       High blood sugar  Musculoskeletal: Positive for gait problem.  Skin: Positive for wound.       breakdown  Allergic/Immunologic: Negative.   Neurological: Negative.   Hematological: Bruises/bleeds easily.  Psychiatric/Behavioral: Negative.   All other systems reviewed and are negative.      Objective:   Physical Exam  Constitutional: She is oriented to person, place, and time. She appears well-developed and well-nourished.  HENT:  Head: Normocephalic and atraumatic.  Neurological: She is alert and oriented to person, place, and time. She has normal reflexes. A sensory deficit is present.  Motor strength is 3 minus the left deltoid, biceps, triceps, grip 3 minus and left hip flexion 4 minus knee extension ankle dorsiflexion is trace has AFO on Sensation is absent in the left upper and reduced in the left lower extremity  Psychiatric: She has a normal mood and affect.  Nursing note and vitals reviewed.  Left shoulder has some pain with abduction. There is no swelling in the joints in the left upper extremity does have some bruising in the left forearm.       Assessment & Plan:  1.Left hemiparesis secondary to right MCA infarct She has severe residual functional deficits including problems with bathing dressing and mobility. Continues to require AFO, she does not ambulate except with therapist.  Have made referral to [facility] outpatient PT and OT  Have continued to encourage patient to use left upper extremity for functional tasks even though she has very little sensation

## 2015-04-16 NOTE — Patient Instructions (Signed)
Oupatient therapy will call you to set up appt.

## 2015-05-03 ENCOUNTER — Ambulatory Visit (HOSPITAL_COMMUNITY): Payer: Medicare Other | Attending: Physical Medicine & Rehabilitation | Admitting: Physical Therapy

## 2015-05-03 DIAGNOSIS — R262 Difficulty in walking, not elsewhere classified: Secondary | ICD-10-CM | POA: Insufficient documentation

## 2015-05-03 DIAGNOSIS — Z7409 Other reduced mobility: Secondary | ICD-10-CM | POA: Diagnosis not present

## 2015-05-03 DIAGNOSIS — I69354 Hemiplegia and hemiparesis following cerebral infarction affecting left non-dominant side: Secondary | ICD-10-CM | POA: Insufficient documentation

## 2015-05-03 DIAGNOSIS — R269 Unspecified abnormalities of gait and mobility: Secondary | ICD-10-CM | POA: Insufficient documentation

## 2015-05-03 DIAGNOSIS — G8194 Hemiplegia, unspecified affecting left nondominant side: Secondary | ICD-10-CM

## 2015-05-03 NOTE — Therapy (Signed)
[facility] [address] [place], [place], [address] Phone: [phone]   Fax:  [phone]  Physical Therapy Evaluation  Patient Details  Name: Diane Hutchinson MRN: [id number] Date of Birth: 1946/03/12 Referring Provider:  Charlett Blake, MD  Encounter Date: 05/03/2015      PT End of Session - 05/03/15 1626    Visit Number 1   Number of Visits 12   Date for PT Re-Evaluation 06/02/15   Authorization Type Medicare   PT Start Time 1300   PT Stop Time 1352   PT Time Calculation (min) 52 min   Equipment Utilized During Treatment Gait belt   Activity Tolerance Patient tolerated treatment well      Past Medical History  Diagnosis Date  . Migraine   . Depression   . Hypertension   . Stroke 2016  . COPD (chronic obstructive pulmonary disease)   . Anxiety     h/o of panic attack  . GERD (gastroesophageal reflux disease)     occas. use of TUMS  . Arthritis     knees    Past Surgical History  Procedure Laterality Date  . [place] surgery  2010  . Cholecystectomy      age 75  . Appendectomy      age 48  . Eye surgery  years ago    laser to both eyes for blocked tear ducts   . Brain surgery  1993    aneurysm  . Tubal ligation  years ago  . Hemorroidectomy  years ago  . Radiology with anesthesia N/A 01/20/2015    Procedure: RADIOLOGY WITH ANESTHESIA;  Surgeon: Luanne Bras, MD;  Location: [place];  Service: Radiology;  Laterality: N/A;    There were no vitals filed for this visit.  Visit Diagnosis:  Hemiparesis, left  Difficulty walking  Abnormality of gait  Impaired functional mobility and activity tolerance      Subjective Assessment - 05/03/15 1314    Subjective Pt presents in a wheelchair with her husband. Pt reports that she is having difficulty controlling her Lt hand and LLE. She has difficulty walking, completing ADLs, getting in and out of bed, getting in and out of chair. She ambulates with a RW,  but is only able to ambulate short distances (10-20 feet) before fatiguing. She has had several recent falls as a result of Lt hemiparesis.    Patient is accompained by: Family member   Pertinent History Pt has history of multiple intracranial aneurysms with Lt MCA elective embolization using stent assistance 01/20/15. Neuro consult suspects non-dominant R brain infarct secondary to complication of embolization.    Limitations Standing;Walking;House hold activities   How long can you stand comfortably? <5 minutes   How long can you walk comfortably? <5 minutes   Patient Stated Goals Wants strength back in her Lt hand, Lt foot, be able to walk, be able to get in and out of bed/chair independently   Currently in Pain? Yes   Pain Score 6    Pain Location Leg   Pain Orientation Left   Pain Descriptors / Indicators Aching;Sharp;Tingling;Numbness   Pain Onset More than a month ago   Pain Frequency Constant            [facility] PT Assessment - 05/03/15 0001    Assessment   Medical Diagnosis Left hemiparesis   Onset Date/Surgical Date 01/20/15   Hand Dominance Right   Next MD Visit 05/28/15   Prior Therapy No  Precautions   Precautions Fall   Required Braces or Orthoses Other Brace/Splint   Other Brace/Splint Lt AFO   Restrictions   Weight Bearing Restrictions No   Balance Screen   Has the patient fallen in the past 6 months Yes   How many times? >10   Has the patient had a decrease in activity level because of a fear of falling?  Yes   Is the patient reluctant to leave their home because of a fear of falling?  Yes   [place] Private residence   Living Arrangements Spouse/significant other   Home Access Ramped entrance   Prior Function   Level of [place] Pt enjoys mowing her lawn   Observation/Other Assessments   Focus on Therapeutic Outcomes (FOTO)  23   Posture/Postural Control   Posture/Postural Control Postural limitations    Postural Limitations Rounded Shoulders;Forward head;Weight shift right   Posture Comments Pt unable to maintain upright posture in sitting due to trunk weakness   ROM / Strength   AROM / PROM / Strength Strength   Strength   Strength Assessment Site Hip;Knee;Ankle   Right/Left Hip Right;Left   Right Hip Flexion 3/5   Left Hip Flexion 2/5   Left Hip ADduction 2/5   Right/Left Knee Right;Left   Right Knee Flexion 4+/5   Right Knee Extension 4+/5   Left Knee Flexion 4-/5   Left Knee Extension 4-/5   Right/Left Ankle Right;Left   Right Ankle Dorsiflexion 5/5   Left Ankle Dorsiflexion 3-/5   Left Ankle Plantar Flexion 3+/5   Bed Mobility   Bed Mobility Supine to Sit;Sit to Supine   Supine to Sit 3: Mod assist   Supine to Sit Details (indicate cue type and reason) Required verbal cueing to roll to Rt side to come to sitting   Sit to Supine 3: Mod assist   Transfers   Transfers Sit to Stand;Stand to Sit;Stand Pivot Transfers   Sit to Stand 3: Mod assist   Sit to Stand Details (indicate cue type and reason) Requires verbal cueing for proper hand placement   Stand to Sit 3: Mod assist   Stand to Sit Details Required verbal cueing for controlled descent   Stand Pivot Transfers 3: Mod assist   Stand Pivot Transfer Details (indicate cue type and reason) Required verbal cueing for safety and for proper technique   Ambulation/Gait   Ambulation/Gait Yes   Ambulation/Gait Assistance 4: Min assist   Ambulation Distance (Feet) 8 Feet   Assistive device Rolling walker   Gait Pattern Decreased step length - right;Decreased hip/knee flexion - left;Decreased weight shift to left;Poor foot clearance - left   Balance   Balance Assessed Yes   Static Sitting Balance   Static Sitting - Balance Support Feet supported   Static Sitting - Level of Assistance 4: Min assist   Static Sitting - Comment/# of Minutes 1 minute. Pt required min guard/min assist to maintain upright posture   Dynamic Sitting  Balance   Dynamic Sitting - Balance Support Feet supported   Dynamic Sitting - Level of Assistance 4: Min assist   Reach (Patient is able to reach ___ inches to right, left, forward, back) 6 forward   Dynamic Sitting balance - Comments Pt demonstrates poor dynamic balance when leaning to Lt side   Static Standing Balance   Static Standing - Balance Support Bilateral upper extremity supported;During functional activity   Static Standing - Level of Assistance 3:  Mod assist   Static Standing - Comment/# of Minutes 1 minute           [facility] Adult PT Treatment/Exercise - 05/03/15 0001    Knee/Hip Exercises: Seated   Long Arc Quad 10 reps   Knee/Hip Exercises: Supine   Straight Leg Raises 5 reps;Left   Straight Leg Raises Limitations with min assist from PT            PT Education - 05/03/15 1625    Education provided Yes   Education Details HEP, POC moving forward   Person(s) Educated Patient;Spouse   Methods Explanation;Handout   Comprehension Verbalized understanding;Returned demonstration          PT Short Term Goals - 05/03/15 1641    PT SHORT TERM GOAL #1   Title Patient will complete functional transfers with min assist in order to decrease burden on caregivers and increase independence with functional mobility.    Baseline Mod assist   Time 3   Period Weeks   Status New   PT SHORT TERM GOAL #2   Title Pt will complete bed mobility with min assist to allow for increased independence.   Baseline Mod assist   Time 3   Period Weeks   Status New   PT SHORT TERM GOAL #3   Title Pt will ambulate 15 feet with RW with min assist in order to improve functional mobility and to allow for pt to ambulate in her home.    Time 3   Period Weeks   Status New           PT Long Term Goals - 05/03/15 1644    PT LONG TERM GOAL #1   Title Pt will complete functional transfers and bed mobility with supervision to allow for greater independence with self care.    Baseline Mod  assist   Time 6   Period Weeks   Status New   PT LONG TERM GOAL #2   Title Pt will ambulate 150 feet with RW with supervision to allow for independence with functional mobility and gait.    Baseline 8 feet min assist   Time 6   Period Weeks   PT LONG TERM GOAL #3   Title Pt will demonstrate good static standing balance for 10 minutes to demonstrate improved functional activity tolerance and to allow for pt to complete self care.   Time 6   Period Weeks   Status New               Plan - 05/03/15 1636    Clinical Impression Statement Pt's hemiparesis affects her functional mobility, gait, and functional activity tolerance. She requires mod assist to complete functional transfers and bed mobility, min assist to ambulate with RW x 8 feet, and demonstrates impairments in seated and standing balance. She will benefit from skilled physical therapy to address the impairments listed above in order to return to her the highest functional level possible. Treatment will focus on improving her LLE strength, balance, functional activity tolerance, gait, and functional mobility in order to allow pt to gain independence with functional tasks.    Pt will benefit from skilled therapeutic intervention in order to improve on the following deficits Abnormal gait;Decreased activity tolerance;Decreased balance;Decreased coordination;Decreased mobility;Decreased strength;Difficulty walking;Pain   Rehab Potential Good   PT Frequency 2x / week   PT Duration 6 weeks   PT Treatment/Interventions ADLs/Self Care Home Management;Gait training;Functional mobility training;Therapeutic activities;Therapeutic exercise;Balance training;Neuromuscular re-education;Patient/family education;Manual techniques   PT  Next Visit Plan Review eval with pt, begin functional transfer training, address decreased activity tolerance   Consulted and Agree with Plan of Care Patient;Family member/caregiver          G-Codes -  05-30-15 1630    Functional Assessment Tool Used FOTO   Functional Limitation Mobility: Walking and moving around   Mobility: Walking and Moving Around Current Status [phone]) At least 60 percent but less than 80 percent impaired, limited or restricted   Mobility: Walking and Moving Around Goal Status [phone]) At least 40 percent but less than 60 percent impaired, limited or restricted       Problem List Patient Active Problem List   Diagnosis Date Noted  . Cerebral infarction due to embolism of left carotid artery 03/04/2015  . Aneurysm, cerebral, nonruptured 03/04/2015  . History of ruptured arterial aneurysm 03/04/2015  . Essential hypertension 03/04/2015  . HLD (hyperlipidemia) 03/04/2015  . Alterations of sensations following CVA (cerebrovascular accident)   . Embolic cerebral infarction 01/22/2015  . Left hemiparesis 01/22/2015  . Stroke   . Cerebral embolism with cerebral infarction 01/21/2015  . Ataxia   . Brain aneurysm 01/20/2015  . TOBACCO ABUSE 01/04/2010  . BRONCHITIS, CHRONIC 01/04/2010  . CERVICAL CANCER 01/03/2010  . HYPERLIPIDEMIA 01/03/2010  . DEPRESSION 01/03/2010  . SUBARACHNOID HEMORRHAGE 01/03/2010  . [name] 01/03/2010    Hilma Favors, PT, DPT [phone] 05/30/2015, 4:49 PM  [facility] [address] [place], [place], [address] Phone: [phone]   Fax:  [phone]

## 2015-05-03 NOTE — Patient Instructions (Signed)
Strengthening: Straight Leg Raise (Phase 1)   Tighten muscles on front of right thigh, then lift leg _15_ inches from surface, keeping knee locked.  Repeat _1___ times per set. Do _1___ sets per session. Do 2-3__ sessions per day.  http://orth.exer.us/614   Copyright  VHI. All rights reserved.  Knee Extension: Resisted (Sitting)   With band looped around right ankle and under other foot, straighten leg with ankle loop. Keep other leg bent to increase resistance. Repeat _10_ times per set. Do _2_ sets per session. Do _1-2___ sessions per day.  http://orth.exer.us/690   Copyright  VHI. All rights reserved.  Strengthening: Hip Abductor - Resisted   With band looped around both legs above knees, push thighs apart. Repeat __15__ times per set. Do _2___ sets per session. Do _1-2___ sessions per day.  http://orth.exer.us/688   Copyright  VHI. All rights reserved.

## 2015-05-10 ENCOUNTER — Ambulatory Visit (HOSPITAL_COMMUNITY): Payer: Medicare Other | Admitting: Physical Therapy

## 2015-05-10 DIAGNOSIS — R269 Unspecified abnormalities of gait and mobility: Secondary | ICD-10-CM

## 2015-05-10 DIAGNOSIS — Z7409 Other reduced mobility: Secondary | ICD-10-CM | POA: Diagnosis not present

## 2015-05-10 DIAGNOSIS — R262 Difficulty in walking, not elsewhere classified: Secondary | ICD-10-CM | POA: Diagnosis not present

## 2015-05-10 DIAGNOSIS — I69354 Hemiplegia and hemiparesis following cerebral infarction affecting left non-dominant side: Secondary | ICD-10-CM | POA: Diagnosis not present

## 2015-05-10 DIAGNOSIS — G8194 Hemiplegia, unspecified affecting left nondominant side: Secondary | ICD-10-CM

## 2015-05-10 NOTE — Therapy (Signed)
[facility] [address] [place], [place], [address] Phone: [phone]   Fax:  [phone]  Physical Therapy Treatment  Patient Details  Name: Diane Hutchinson MRN: [id number] Date of Birth: Sep 19, 1946 Referring Provider:  Charlett Blake, MD  Encounter Date: 05/10/2015      PT End of Session - 05/10/15 1605    Visit Number 2   Number of Visits 12   Date for PT Re-Evaluation 06/02/15   Authorization Type Medicare   PT Start Time 1346   PT Stop Time 1428   PT Time Calculation (min) 42 min   Equipment Utilized During Treatment Gait belt   Activity Tolerance Patient tolerated treatment well;Patient limited by fatigue   Behavior During Therapy [facility] for tasks assessed/performed      Past Medical History  Diagnosis Date  . Migraine   . Depression   . Hypertension   . Stroke 2016  . COPD (chronic obstructive pulmonary disease)   . Anxiety     h/o of panic attack  . GERD (gastroesophageal reflux disease)     occas. use of TUMS  . Arthritis     knees    Past Surgical History  Procedure Laterality Date  . [place] surgery  2010  . Cholecystectomy      age 69  . Appendectomy      age 69  . Eye surgery  years ago    laser to both eyes for blocked tear ducts   . Brain surgery  1993    aneurysm  . Tubal ligation  years ago  . Hemorroidectomy  years ago  . Radiology with anesthesia N/A 01/20/2015    Procedure: RADIOLOGY WITH ANESTHESIA;  Surgeon: Luanne Bras, MD;  Location: [place];  Service: Radiology;  Laterality: N/A;    There were no vitals filed for this visit.  Visit Diagnosis:  Hemiparesis, left  Difficulty walking  Abnormality of gait  Impaired functional mobility and activity tolerance      Subjective Assessment - 05/10/15 1520    Subjective Pt reports that she has been walking with her husband in her home for short distances. She still has numbness in her L hand, and also feels that her LLE is weak  and "heavy."   Currently in Pain? Yes   Pain Score 5    Pain Location Hand   Pain Orientation Left           [facility] Adult PT Treatment/Exercise - 05/10/15 0001    Ambulation/Gait   Ambulation/Gait Yes   Ambulation/Gait Assistance 4: Min assist   Ambulation Distance (Feet) 30 Feet  30 ft x 2   Assistive device Rolling walker   Gait Pattern Decreased step length - right;Decreased stance time - left;Decreased weight shift to left   Ambulation Surface Level   Knee/Hip Exercises: Standing   Functional Squat 10 reps   Functional Squat Limitations STS from mat table   Other Standing Knee Exercises sidestepping at mat table, one side x 1 RT   Knee/Hip Exercises: Seated   Other Seated Knee/Hip Exercises seated cone retrieval from 8 inch box to rolling table   Knee/Hip Exercises: Supine   Bridges Strengthening;2 sets;10 reps   Straight Leg Raises 10 reps;Left   Straight Leg Raises Limitations with min assist from PT                PT Education - 05/10/15 1604    Education provided Yes   Education Details Educated on  increasing step length on R, continuing with HEP   Person(s) Educated Patient;Spouse   Methods Explanation   Comprehension Verbalized understanding;Returned demonstration          PT Short Term Goals - 05/03/15 1641    PT SHORT TERM GOAL #1   Title Patient will complete functional transfers with min assist in order to decrease burden on caregivers and increase independence with functional mobility.    Baseline Mod assist   Time 3   Period Weeks   Status New   PT SHORT TERM GOAL #2   Title Pt will complete bed mobility with min assist to allow for increased independence.   Baseline Mod assist   Time 3   Period Weeks   Status New   PT SHORT TERM GOAL #3   Title Pt will ambulate 15 feet with RW with min assist in order to improve functional mobility and to allow for pt to ambulate in her home.    Time 3   Period Weeks   Status New           PT  Long Term Goals - 05/03/15 1644    PT LONG TERM GOAL #1   Title Pt will complete functional transfers and bed mobility with supervision to allow for greater independence with self care.    Baseline Mod assist   Time 6   Period Weeks   Status New   PT LONG TERM GOAL #2   Title Pt will ambulate 150 feet with RW with supervision to allow for independence with functional mobility and gait.    Baseline 8 feet min assist   Time 6   Period Weeks   PT LONG TERM GOAL #3   Title Pt will demonstrate good static standing balance for 10 minutes to demonstrate improved functional activity tolerance and to allow for pt to complete self care.   Time 6   Period Weeks   Status New               Plan - 05/10/15 1605    Clinical Impression Statement Pt performed well in today's session, and was able to ambulate 30' x 2 with min A and verbal cueing to increase step length on R. She required min A during bridging to maintain neutral hip alignment, and was able to correct with tactlie cueing. She will benefit from continueing with LLE strengthening as well as gait and balance training.    PT Next Visit Plan Continue with gait traininig, seated and standing balance training.         Problem List Patient Active Problem List   Diagnosis Date Noted  . Cerebral infarction due to embolism of left carotid artery 03/04/2015  . Aneurysm, cerebral, nonruptured 03/04/2015  . History of ruptured arterial aneurysm 03/04/2015  . Essential hypertension 03/04/2015  . HLD (hyperlipidemia) 03/04/2015  . Alterations of sensations following CVA (cerebrovascular accident)   . Embolic cerebral infarction 01/22/2015  . Left hemiparesis 01/22/2015  . Stroke   . Cerebral embolism with cerebral infarction 01/21/2015  . Ataxia   . Brain aneurysm 01/20/2015  . TOBACCO ABUSE 01/04/2010  . BRONCHITIS, CHRONIC 01/04/2010  . CERVICAL CANCER 01/03/2010  . HYPERLIPIDEMIA 01/03/2010  . DEPRESSION 01/03/2010  .  SUBARACHNOID HEMORRHAGE 01/03/2010  . [name] 01/03/2010    Hilma Favors, PT, DPT [phone] 05/10/2015, 4:09 PM  [facility] [place], [place], [address] Phone: [phone]   Fax:  [phone]

## 2015-05-12 ENCOUNTER — Ambulatory Visit (HOSPITAL_COMMUNITY): Payer: Medicare Other | Admitting: Physical Therapy

## 2015-05-12 DIAGNOSIS — I69354 Hemiplegia and hemiparesis following cerebral infarction affecting left non-dominant side: Secondary | ICD-10-CM | POA: Diagnosis not present

## 2015-05-12 DIAGNOSIS — R269 Unspecified abnormalities of gait and mobility: Secondary | ICD-10-CM | POA: Diagnosis not present

## 2015-05-12 DIAGNOSIS — Z7409 Other reduced mobility: Secondary | ICD-10-CM | POA: Diagnosis not present

## 2015-05-12 DIAGNOSIS — R262 Difficulty in walking, not elsewhere classified: Secondary | ICD-10-CM

## 2015-05-12 DIAGNOSIS — G8194 Hemiplegia, unspecified affecting left nondominant side: Secondary | ICD-10-CM

## 2015-05-12 NOTE — Therapy (Signed)
[place], [place], [address] Phone: [phone]   Fax:  [phone]  Physical Therapy Treatment  Patient Details  Name: Diane Hutchinson MRN: [id number] Date of Birth: 01-02-1946 Referring Provider:  Charlett Blake, MD  Encounter Date: 05/12/2015      PT End of Session - 05/12/15 1833    Visit Number 4   Number of Visits 12   Date for PT Re-Evaluation 06/02/15   Authorization Type Medicare   Authorization - Visit Number 4   Authorization - Number of Visits 10   PT Start Time 3235   PT Stop Time 1440   PT Time Calculation (min) 55 min   Activity Tolerance Patient tolerated treatment well   Behavior During Therapy [facility] for tasks assessed/performed      Past Medical History  Diagnosis Date  . Migraine   . Depression   . Hypertension   . Stroke 2016  . COPD (chronic obstructive pulmonary disease)   . Anxiety     h/o of panic attack  . GERD (gastroesophageal reflux disease)     occas. use of TUMS  . Arthritis     knees    Past Surgical History  Procedure Laterality Date  . [place] surgery  2010  . Cholecystectomy      age 22  . Appendectomy      age 39  . Eye surgery  years ago    laser to both eyes for blocked tear ducts   . Brain surgery  1993    aneurysm  . Tubal ligation  years ago  . Hemorroidectomy  years ago  . Radiology with anesthesia N/A 01/20/2015    Procedure: RADIOLOGY WITH ANESTHESIA;  Surgeon: Luanne Bras, MD;  Location: [place];  Service: Radiology;  Laterality: N/A;    There were no vitals filed for this visit.  Visit Diagnosis:  Hemiparesis, left  Difficulty walking  Abnormality of gait  Impaired functional mobility and activity tolerance      Subjective Assessment - 05/12/15 1835    Subjective Pt reports she is doing her exercises at home.  currently wtihotu pain.   Currently in Pain? No/denies                         [facility] Adult PT  Treatment/Exercise - 05/12/15 1830    Ambulation/Gait   Ambulation/Gait Yes   Ambulation/Gait Assistance 4: Min assist   Ambulation Distance (Feet) 200 Feet  100 feet X 2   Assistive device Rolling walker   Gait Pattern Decreased step length - right;Decreased stance time - left;Decreased weight shift to left   Knee/Hip Exercises: Aerobic   Nustep 6 minutes UE/LE level 2 hills #2   Knee/Hip Exercises: Seated   Other Seated Knee/Hip Exercises seated cone rotation 2RT each   Sit to Sand 5 reps;without UE support   Knee/Hip Exercises: Supine   Bridges Strengthening;2 sets;10 reps   Straight Leg Raises 10 reps;Left   Straight Leg Raises Limitations with min assist from PT                  PT Short Term Goals - 05/03/15 1641    PT SHORT TERM GOAL #1   Title Patient will complete functional transfers with min assist in order to decrease burden on caregivers and increase independence with functional mobility.    Baseline Mod assist   Time 3   Period Weeks  Status New   PT SHORT TERM GOAL #2   Title Pt will complete bed mobility with min assist to allow for increased independence.   Baseline Mod assist   Time 3   Period Weeks   Status New   PT SHORT TERM GOAL #3   Title Pt will ambulate 15 feet with RW with min assist in order to improve functional mobility and to allow for pt to ambulate in her home.    Time 3   Period Weeks   Status New           PT Long Term Goals - 05/03/15 1644    PT LONG TERM GOAL #1   Title Pt will complete functional transfers and bed mobility with supervision to allow for greater independence with self care.    Baseline Mod assist   Time 6   Period Weeks   Status New   PT LONG TERM GOAL #2   Title Pt will ambulate 150 feet with RW with supervision to allow for independence with functional mobility and gait.    Baseline 8 feet min assist   Time 6   Period Weeks   PT LONG TERM GOAL #3   Title Pt will demonstrate good static standing  balance for 10 minutes to demonstrate improved functional activity tolerance and to allow for pt to complete self care.   Time 6   Period Weeks   Status New               Plan - 05/12/15 1759    Clinical Impression Statement Pt with improvements noted today with ability to keep Lt hand on walker for duration of ambulation.  Increased distance of 100 feet X 2 bouts.  Pt requires verbal cues for posturing and fully lifting Lt LE to advance.  Worked on balance/coordination tasks in seated position and began sit to stand activity without use of UE's.  Pt was very pleased to be able to complete this task.  Added nustep today to work on reciprical motion and increase strength.     PT Next Visit Plan Continue with gait traininig, seated and standing balance training.           Problem List Patient Active Problem List   Diagnosis Date Noted  . Cerebral infarction due to embolism of left carotid artery 03/04/2015  . Aneurysm, cerebral, nonruptured 03/04/2015  . History of ruptured arterial aneurysm 03/04/2015  . Essential hypertension 03/04/2015  . HLD (hyperlipidemia) 03/04/2015  . Alterations of sensations following CVA (cerebrovascular accident)   . Embolic cerebral infarction 01/22/2015  . Left hemiparesis 01/22/2015  . Stroke   . Cerebral embolism with cerebral infarction 01/21/2015  . Ataxia   . Brain aneurysm 01/20/2015  . TOBACCO ABUSE 01/04/2010  . BRONCHITIS, CHRONIC 01/04/2010  . CERVICAL CANCER 01/03/2010  . HYPERLIPIDEMIA 01/03/2010  . DEPRESSION 01/03/2010  . SUBARACHNOID HEMORRHAGE 01/03/2010  . [name] 01/03/2010    Teena Irani, PTA/CLT [phone]  05/12/2015, 6:36 PM  [place] [address] [place], [place], [address] Phone: [phone]   Fax:  [phone]

## 2015-05-14 ENCOUNTER — Ambulatory Visit (HOSPITAL_COMMUNITY): Payer: Medicare Other | Attending: Physical Medicine & Rehabilitation

## 2015-05-14 DIAGNOSIS — M6281 Muscle weakness (generalized): Secondary | ICD-10-CM | POA: Diagnosis not present

## 2015-05-14 DIAGNOSIS — G8190 Hemiplegia, unspecified affecting unspecified side: Secondary | ICD-10-CM | POA: Diagnosis not present

## 2015-05-14 DIAGNOSIS — R269 Unspecified abnormalities of gait and mobility: Secondary | ICD-10-CM | POA: Diagnosis not present

## 2015-05-14 DIAGNOSIS — Z789 Other specified health status: Secondary | ICD-10-CM | POA: Diagnosis not present

## 2015-05-14 DIAGNOSIS — R262 Difficulty in walking, not elsewhere classified: Secondary | ICD-10-CM | POA: Diagnosis not present

## 2015-05-14 DIAGNOSIS — Z7409 Other reduced mobility: Secondary | ICD-10-CM | POA: Diagnosis not present

## 2015-05-14 DIAGNOSIS — M6289 Other specified disorders of muscle: Secondary | ICD-10-CM | POA: Diagnosis not present

## 2015-05-14 DIAGNOSIS — I6980 Unspecified sequelae of other cerebrovascular disease: Secondary | ICD-10-CM | POA: Insufficient documentation

## 2015-05-14 DIAGNOSIS — G8194 Hemiplegia, unspecified affecting left nondominant side: Secondary | ICD-10-CM

## 2015-05-14 DIAGNOSIS — R29898 Other symptoms and signs involving the musculoskeletal system: Secondary | ICD-10-CM | POA: Insufficient documentation

## 2015-05-14 DIAGNOSIS — R279 Unspecified lack of coordination: Secondary | ICD-10-CM | POA: Diagnosis not present

## 2015-05-14 NOTE — Therapy (Signed)
[place], [place], [address] Phone: [phone]   Fax:  [phone]  Physical Therapy Treatment  Patient Details  Name: Diane Hutchinson MRN: [id number] Date of Birth: Jan 24, 1946 Referring Provider:  Charlett Blake, MD  Encounter Date: 05/14/2015      PT End of Session - 05/14/15 1403    Visit Number 5   Number of Visits 12   Date for PT Re-Evaluation 06/02/15   Authorization Type Medicare   Authorization - Visit Number 5   Authorization - Number of Visits 10   PT Start Time 7564   PT Stop Time 1400   PT Time Calculation (min) 57 min   Equipment Utilized During Treatment Gait belt   Activity Tolerance Patient limited by fatigue;Patient tolerated treatment well   Behavior During Therapy [facility] for tasks assessed/performed      Past Medical History  Diagnosis Date  . Migraine   . Depression   . Hypertension   . Stroke 2016  . COPD (chronic obstructive pulmonary disease)   . Anxiety     h/o of panic attack  . GERD (gastroesophageal reflux disease)     occas. use of TUMS  . Arthritis     knees    Past Surgical History  Procedure Laterality Date  . [place] surgery  2010  . Cholecystectomy      age 62  . Appendectomy      age 62  . Eye surgery  years ago    laser to both eyes for blocked tear ducts   . Brain surgery  1993    aneurysm  . Tubal ligation  years ago  . Hemorroidectomy  years ago  . Radiology with anesthesia N/A 01/20/2015    Procedure: RADIOLOGY WITH ANESTHESIA;  Surgeon: Luanne Bras, MD;  Location: [place];  Service: Radiology;  Laterality: N/A;    There were no vitals filed for this visit.  Visit Diagnosis:  Hemiparesis, left  Difficulty walking  Abnormality of gait  Impaired functional mobility and activity tolerance      Subjective Assessment - 05/14/15 1341    Subjective Pt reports walking around the house and completeing HEP daily, stated difficulty with Lt  UE   Currently in Pain? No/denies           [facility] Adult PT Treatment/Exercise - 05/14/15 0001    Ambulation/Gait   Ambulation/Gait Yes   Ambulation/Gait Assistance 4: Min assist   Ambulation Distance (Feet) 250 Feet  3 sets   Assistive device Rolling walker   Gait Pattern Decreased step length - right;Decreased stance time - left;Decreased weight shift to left   Ambulation Surface Level   Knee/Hip Exercises: Aerobic   Nustep 6 minutes UE/LE level 2 hills #2   Knee/Hip Exercises: Seated   Long Arc Quad 10 reps   Sit to [employer] 5 reps;without UE support   Knee/Hip Exercises: Supine   Bridges Strengthening;2 sets;10 reps   Knee/Hip Exercises: Sidelying   Hip ABduction Both;10 reps;AAROM   Other Sidelying Knee/Hip Exercises hamstring curls on blue wedge 10x            PT Short Term Goals - 05/14/15 1735    PT SHORT TERM GOAL #1   Title Patient will complete functional transfers with min assist in order to decrease burden on caregivers and increase independence with functional mobility.    Status On-going   PT SHORT TERM GOAL #2   Title Pt will complete bed  mobility with min assist to allow for increased independence.   Status On-going   PT SHORT TERM GOAL #3   Title Pt will ambulate 15 feet with RW with min assist in order to improve functional mobility and to allow for pt to ambulate in her home.    Status On-going           PT Long Term Goals - 05/14/15 1735    PT LONG TERM GOAL #1   Title Pt will complete functional transfers and bed mobility with supervision to allow for greater independence with self care.    PT LONG TERM GOAL #2   Title Pt will ambulate 150 feet with RW with supervision to allow for independence with functional mobility and gait.    PT LONG TERM GOAL #3   Title Pt will demonstrate good static standing balance for 10 minutes to demonstrate improved functional activity tolerance and to allow for pt to complete self care.                Plan - 05/14/15 1726    Clinical Impression Statement Pt with increased difficulty keeping Lt hand on walker this session, pt educated on importance of posture to reduce pressure on hands with improved abilty to keep hand on walker just for balance.  Multimodal cueing during gait to improve posture, fully lifting Lt LE, and to equalize stance phase.  Added sidelying hamstring curls and glut med strengthennig exercises to improve balance and gait.  Ended with Nustep to improve reciprocal motion and strengtheing.     PT Next Visit Plan Continue with gait traininig, seated and standing balance training.           Problem List Patient Active Problem List   Diagnosis Date Noted  . Cerebral infarction due to embolism of left carotid artery 03/04/2015  . Aneurysm, cerebral, nonruptured 03/04/2015  . History of ruptured arterial aneurysm 03/04/2015  . Essential hypertension 03/04/2015  . HLD (hyperlipidemia) 03/04/2015  . Alterations of sensations following CVA (cerebrovascular accident)   . Embolic cerebral infarction 01/22/2015  . Left hemiparesis 01/22/2015  . Stroke   . Cerebral embolism with cerebral infarction 01/21/2015  . Ataxia   . Brain aneurysm 01/20/2015  . TOBACCO ABUSE 01/04/2010  . BRONCHITIS, CHRONIC 01/04/2010  . CERVICAL CANCER 01/03/2010  . HYPERLIPIDEMIA 01/03/2010  . DEPRESSION 01/03/2010  . SUBARACHNOID HEMORRHAGE 01/03/2010  . Rudean Hitt D 01/03/2010   Aldona Lento, PTA  Aldona Lento 05/14/2015, 5:37 PM  [place] [address] [place], [place], [address] Phone: [phone]   Fax:  [phone]

## 2015-05-19 ENCOUNTER — Ambulatory Visit (HOSPITAL_COMMUNITY): Payer: Medicare Other | Admitting: Physical Therapy

## 2015-05-19 DIAGNOSIS — G8194 Hemiplegia, unspecified affecting left nondominant side: Secondary | ICD-10-CM

## 2015-05-19 DIAGNOSIS — I6980 Unspecified sequelae of other cerebrovascular disease: Secondary | ICD-10-CM | POA: Diagnosis not present

## 2015-05-19 DIAGNOSIS — G8190 Hemiplegia, unspecified affecting unspecified side: Secondary | ICD-10-CM | POA: Diagnosis not present

## 2015-05-19 DIAGNOSIS — R269 Unspecified abnormalities of gait and mobility: Secondary | ICD-10-CM

## 2015-05-19 DIAGNOSIS — Z7409 Other reduced mobility: Secondary | ICD-10-CM

## 2015-05-19 DIAGNOSIS — R262 Difficulty in walking, not elsewhere classified: Secondary | ICD-10-CM

## 2015-05-19 DIAGNOSIS — M6289 Other specified disorders of muscle: Secondary | ICD-10-CM | POA: Diagnosis not present

## 2015-05-19 NOTE — Therapy (Signed)
[place] [address] [place], [place], [address] Phone: [phone]   Fax:  [phone]  Physical Therapy Treatment  Patient Details  Name: Diane Hutchinson MRN: [id number] Date of Birth: [birth year] Referring Provider:  Charlett Blake, MD  Encounter Date: 05/19/2015      PT End of Session - 05/19/15 1447    Visit Number 6   Number of Visits 12   Date for PT Re-Evaluation 06/02/15   Authorization Type Medicare   Authorization - Visit Number 6   Authorization - Number of Visits 10   PT Start Time 4235   PT Stop Time 1430   PT Time Calculation (min) 42 min   Equipment Utilized During Treatment Gait belt   Activity Tolerance Patient tolerated treatment well;Patient limited by fatigue   Behavior During Therapy [facility] for tasks assessed/performed      Past Medical History  Diagnosis Date  . Migraine   . Depression   . Hypertension   . Stroke 2016  . COPD (chronic obstructive pulmonary disease)   . Anxiety     h/o of panic attack  . GERD (gastroesophageal reflux disease)     occas. use of TUMS  . Arthritis     knees    Past Surgical History  Procedure Laterality Date  . [place] surgery  2010  . Cholecystectomy      age 75  . Appendectomy      age 29  . Eye surgery  years ago    laser to both eyes for blocked tear ducts   . Brain surgery  1993    aneurysm  . Tubal ligation  years ago  . Hemorroidectomy  years ago  . Radiology with anesthesia N/A 01/20/2015    Procedure: RADIOLOGY WITH ANESTHESIA;  Surgeon: Luanne Bras, MD;  Location: [place];  Service: Radiology;  Laterality: N/A;    There were no vitals filed for this visit.  Visit Diagnosis:  Hemiparesis, left  Difficulty walking  Abnormality of gait  Impaired functional mobility and activity tolerance      Subjective Assessment - 05/19/15 1359    Subjective Patient reports that she is having a rough day today, "L arm just won't stay on the  walker" and feeling weak, really didn't sleep well last night. States that getting dressed and walking are still the hardest things for her to do.    Pertinent History Pt has history of multiple intracranial aneurysms with Lt MCA elective embolization using stent assistance 01/20/15. Neuro consult suspects non-dominant R brain infarct secondary to complication of embolization.    Currently in Pain? No/denies                         [facility] Adult PT Treatment/Exercise - 05/19/15 0001    Bed Mobility   Bed Mobility Supine to Sit;Sit to Supine   Supine to Sit 4: Min guard   Sit to Supine 4: Min guard   Transfers   Sit to Stand 3: Mod assist   Sit to Stand Details (indicate cue type and reason) tactile cues for hand placement    Stand to Sit 4: Min assist   Stand to Sit Details still requires some cues for controlled descent   Stand Pivot Transfers 3: Mod assist   Stand Pivot Transfer Details (indicate cue type and reason) with walker; posterior LOB noted when navigating walker in tight space    Knee/Hip Exercises: Standing  Heel Raises Both;1 set;10 reps   Heel Raises Limitations B HHA, knee block    Other Standing Knee Exercises weight shifting side to side with cues for posture in parallel bars    Other Standing Knee Exercises mini-squat 1x10 with L knee block in parallel bars    Knee/Hip Exercises: Seated   Other Seated Knee/Hip Exercises pelvic walking at edge of mat table x3   Other Seated Knee/Hip Exercises seated cone rotations and cross mid-line reaches sitting on air cushion    Knee/Hip Exercises: Supine   Bridges Both;1 set;10 reps   Bridges Limitations stabilization of L LE    Straight Leg Raises 10 reps   Straight Leg Raises Limitations min assist from PT for L LE    Other Supine Knee/Hip Exercises resisted rolling 1x5 each side                 PT Education - 05/19/15 1446    Education provided Yes   Education Details educated on possibly using light  weight to reduce spontaneous movements L UE   Person(s) Educated Patient;Spouse   Methods Explanation   Comprehension Verbalized understanding          PT Short Term Goals - 05/14/15 1735    PT SHORT TERM GOAL #1   Title Patient will complete functional transfers with min assist in order to decrease burden on caregivers and increase independence with functional mobility.    Status On-going   PT SHORT TERM GOAL #2   Title Pt will complete bed mobility with min assist to allow for increased independence.   Status On-going   PT SHORT TERM GOAL #3   Title Pt will ambulate 15 feet with RW with min assist in order to improve functional mobility and to allow for pt to ambulate in her home.    Status On-going           PT Long Term Goals - 05/14/15 1735    PT LONG TERM GOAL #1   Title Pt will complete functional transfers and bed mobility with supervision to allow for greater independence with self care.    PT LONG TERM GOAL #2   Title Pt will ambulate 150 feet with RW with supervision to allow for independence with functional mobility and gait.    PT LONG TERM GOAL #3   Title Pt will demonstrate good static standing balance for 10 minutes to demonstrate improved functional activity tolerance and to allow for pt to complete self care.               Plan - 05/19/15 1448    Clinical Impression Statement Patient states that she really did not sleep very well last night, also having more trouble today with L UE having spontaneous motions. Performed functional exercise on mat table today with focus on core and proximal muscle activation. Also introduced transfer from [facility] to table with walker; patient displayed some difficulty in maintaining balance during this task. Also introduced core/proximal muscle work at edge of mat table as well as standing functional exercises in parallel bars today with knee block.    Pt will benefit from skilled therapeutic intervention in order to improve on  the following deficits Abnormal gait;Decreased activity tolerance;Decreased balance;Decreased coordination;Decreased mobility;Decreased strength;Difficulty walking;Pain   Rehab Potential Good   PT Frequency 2x / week   PT Duration 6 weeks   PT Treatment/Interventions ADLs/Self Care Home Management;Gait training;Functional mobility training;Therapeutic activities;Therapeutic exercise;Balance training;Neuromuscular re-education;Patient/family education;Manual techniques   PT Next  Visit Plan Continue gait training, balance training, functional strength, transfer training. Trial light weights on L UE and L LE to improve proprioception.    Consulted and Agree with Plan of Care Patient;Family member/caregiver        Problem List Patient Active Problem List   Diagnosis Date Noted  . Cerebral infarction due to embolism of left carotid artery 03/04/2015  . Aneurysm, cerebral, nonruptured 03/04/2015  . History of ruptured arterial aneurysm 03/04/2015  . Essential hypertension 03/04/2015  . HLD (hyperlipidemia) 03/04/2015  . Alterations of sensations following CVA (cerebrovascular accident)   . Embolic cerebral infarction 01/22/2015  . Left hemiparesis 01/22/2015  . Stroke   . Cerebral embolism with cerebral infarction 01/21/2015  . Ataxia   . Brain aneurysm 01/20/2015  . TOBACCO ABUSE 01/04/2010  . BRONCHITIS, CHRONIC 01/04/2010  . CERVICAL CANCER 01/03/2010  . HYPERLIPIDEMIA 01/03/2010  . DEPRESSION 01/03/2010  . SUBARACHNOID HEMORRHAGE 01/03/2010  . [name] 01/03/2010    Deniece Ree PT, DPT [place] [address] [place], [place], [address] Phone: [phone]   Fax:  [phone]

## 2015-05-24 ENCOUNTER — Ambulatory Visit (HOSPITAL_COMMUNITY): Payer: Medicare Other | Admitting: Physical Therapy

## 2015-05-24 DIAGNOSIS — G8194 Hemiplegia, unspecified affecting left nondominant side: Secondary | ICD-10-CM

## 2015-05-24 DIAGNOSIS — G8190 Hemiplegia, unspecified affecting unspecified side: Secondary | ICD-10-CM | POA: Diagnosis not present

## 2015-05-24 DIAGNOSIS — Z7409 Other reduced mobility: Secondary | ICD-10-CM | POA: Diagnosis not present

## 2015-05-24 DIAGNOSIS — R262 Difficulty in walking, not elsewhere classified: Secondary | ICD-10-CM | POA: Diagnosis not present

## 2015-05-24 DIAGNOSIS — M6289 Other specified disorders of muscle: Secondary | ICD-10-CM | POA: Diagnosis not present

## 2015-05-24 DIAGNOSIS — R269 Unspecified abnormalities of gait and mobility: Secondary | ICD-10-CM | POA: Diagnosis not present

## 2015-05-24 DIAGNOSIS — I6980 Unspecified sequelae of other cerebrovascular disease: Secondary | ICD-10-CM | POA: Diagnosis not present

## 2015-05-24 NOTE — Therapy (Signed)
[facility] [place], [place], [address] Phone: [phone]   Fax:  [phone]  Physical Therapy Treatment  Patient Details  Name: Diane Hutchinson MRN: [id number] Date of Birth: 1946-04-15 Referring Provider:  Charlett Blake, MD  Encounter Date: 05/24/2015      PT End of Session - 05/24/15 1746    Visit Number 7   Number of Visits 12   Date for PT Re-Evaluation 06/02/15   Authorization Type Medicare   Authorization - Visit Number 7   Authorization - Number of Visits 10   PT Start Time 6314   PT Stop Time 1423   PT Time Calculation (min) 38 min   Equipment Utilized During Treatment Gait belt   Activity Tolerance Patient tolerated treatment well;Patient limited by fatigue   Behavior During Therapy [facility] for tasks assessed/performed      Past Medical History  Diagnosis Date  . Migraine   . Depression   . Hypertension   . Stroke 2016  . COPD (chronic obstructive pulmonary disease)   . Anxiety     h/o of panic attack  . GERD (gastroesophageal reflux disease)     occas. use of TUMS  . Arthritis     knees    Past Surgical History  Procedure Laterality Date  . [place] surgery  2010  . Cholecystectomy      age 26  . Appendectomy      age 67  . Eye surgery  years ago    laser to both eyes for blocked tear ducts   . Brain surgery  1993    aneurysm  . Tubal ligation  years ago  . Hemorroidectomy  years ago  . Radiology with anesthesia N/A 01/20/2015    Procedure: RADIOLOGY WITH ANESTHESIA;  Surgeon: Luanne Bras, MD;  Location: [facility];  Service: Radiology;  Laterality: N/A;    There were no vitals filed for this visit.  Visit Diagnosis:  Hemiparesis, left  Difficulty walking  Abnormality of gait  Impaired functional mobility and activity tolerance      Subjective Assessment - 05/24/15 1743    Subjective Patient reports she is not feeling good today, having another rough day and really did  not get much of any sleep last night. Just feeling weak and not very good overall.    Pertinent History Pt has history of multiple intracranial aneurysms with Lt MCA elective embolization using stent assistance 01/20/15. Neuro consult suspects non-dominant R brain infarct secondary to complication of embolization.    Currently in Pain? No/denies                         [facility] Adult PT Treatment/Exercise - 05/24/15 0001    Ambulation/Gait   Ambulation/Gait Yes   Ambulation/Gait Assistance 4: Min assist   Ambulation Distance (Feet) --  84ft, 42ft   Assistive device Rolling walker   Gait Pattern Decreased step length - right;Decreased stance time - left;Decreased weight shift to left   Ambulation Surface Level   Knee/Hip Exercises: Standing   Heel Raises Both;1 set;10 reps   Other Standing Knee Exercises weight shifting side to side with cues for posture in parallel bars; cone rotations x1 in in standing in parallel bars    Other Standing Knee Exercises mini-squat 1x10 with L knee block in parallel bars    Knee/Hip Exercises: Seated   Long Arc Quad 10 reps   Long Arc Quad Weight 2 lbs.  Other Seated Knee/Hip Exercises seated marches 1x10 with 2# weight                 PT Education - 05/24/15 1746    Education provided No          PT Short Term Goals - 05/14/15 1735    PT SHORT TERM GOAL #1   Title Patient will complete functional transfers with min assist in order to decrease burden on caregivers and increase independence with functional mobility.    Status On-going   PT SHORT TERM GOAL #2   Title Pt will complete bed mobility with min assist to allow for increased independence.   Status On-going   PT SHORT TERM GOAL #3   Title Pt will ambulate 15 feet with RW with min assist in order to improve functional mobility and to allow for pt to ambulate in her home.    Status On-going           PT Long Term Goals - 05/14/15 1735    PT LONG TERM GOAL #1    Title Pt will complete functional transfers and bed mobility with supervision to allow for greater independence with self care.    PT LONG TERM GOAL #2   Title Pt will ambulate 150 feet with RW with supervision to allow for independence with functional mobility and gait.    PT LONG TERM GOAL #3   Title Pt will demonstrate good static standing balance for 10 minutes to demonstrate improved functional activity tolerance and to allow for pt to complete self care.               Plan - 05/24/15 1746    Clinical Impression Statement Patient arrived very fatigued today, again stating that she was not feeling 100% and that she really did not sleep much at all last night. Performed standing exercises in parallel bars as tolerated with frequent rest breaks as needed.  Also performed seated exercises today. Patient had much more difficulty with gait than her norm, with Min(A)x1 and Min guard x1 with rolling walker and more difficulty with postural impairment, very quick to fatigue during gait today. Pt and spouse state she is giong to MD soon; recommend encouraging them to speak to MD about new difficulty sleeping and increased fatigue as it is significantly impacting her physical performance.    Pt will benefit from skilled therapeutic intervention in order to improve on the following deficits Abnormal gait;Decreased activity tolerance;Decreased balance;Decreased coordination;Decreased mobility;Decreased strength;Difficulty walking;Pain   Rehab Potential Good   PT Frequency 2x / week   PT Duration 6 weeks   PT Treatment/Interventions ADLs/Self Care Home Management;Gait training;Functional mobility training;Therapeutic activities;Therapeutic exercise;Balance training;Neuromuscular re-education;Patient/family education;Manual techniques   PT Next Visit Plan Continue gait training, balance training, functional strength, transfer training. Trial light weights on L UE and L LE to improve proprioception.     Consulted and Agree with Plan of Care Patient;Family member/caregiver        Problem List Patient Active Problem List   Diagnosis Date Noted  . Cerebral infarction due to embolism of left carotid artery 03/04/2015  . Aneurysm, cerebral, nonruptured 03/04/2015  . History of ruptured arterial aneurysm 03/04/2015  . Essential hypertension 03/04/2015  . HLD (hyperlipidemia) 03/04/2015  . Alterations of sensations following CVA (cerebrovascular accident)   . Embolic cerebral infarction 01/22/2015  . Left hemiparesis 01/22/2015  . Stroke   . Cerebral embolism with cerebral infarction 01/21/2015  . Ataxia   . Brain aneurysm  01/20/2015  . TOBACCO ABUSE 01/04/2010  . BRONCHITIS, CHRONIC 01/04/2010  . CERVICAL CANCER 01/03/2010  . HYPERLIPIDEMIA 01/03/2010  . DEPRESSION 01/03/2010  . SUBARACHNOID HEMORRHAGE 01/03/2010  . [name] 01/03/2010    Deniece Ree PT, DPT [facility] [address] [place], [place], [address] Phone: [phone]   Fax:  [phone]

## 2015-05-25 DIAGNOSIS — G629 Polyneuropathy, unspecified: Secondary | ICD-10-CM | POA: Diagnosis not present

## 2015-05-25 DIAGNOSIS — E782 Mixed hyperlipidemia: Secondary | ICD-10-CM | POA: Diagnosis not present

## 2015-05-25 DIAGNOSIS — I10 Essential (primary) hypertension: Secondary | ICD-10-CM | POA: Diagnosis not present

## 2015-05-25 DIAGNOSIS — I6350 Cerebral infarction due to unspecified occlusion or stenosis of unspecified cerebral artery: Secondary | ICD-10-CM | POA: Diagnosis not present

## 2015-05-26 ENCOUNTER — Ambulatory Visit (HOSPITAL_COMMUNITY): Payer: Medicare Other

## 2015-05-26 DIAGNOSIS — G8194 Hemiplegia, unspecified affecting left nondominant side: Secondary | ICD-10-CM

## 2015-05-26 DIAGNOSIS — M6289 Other specified disorders of muscle: Secondary | ICD-10-CM | POA: Diagnosis not present

## 2015-05-26 DIAGNOSIS — R269 Unspecified abnormalities of gait and mobility: Secondary | ICD-10-CM | POA: Diagnosis not present

## 2015-05-26 DIAGNOSIS — G8190 Hemiplegia, unspecified affecting unspecified side: Secondary | ICD-10-CM | POA: Diagnosis not present

## 2015-05-26 DIAGNOSIS — Z7409 Other reduced mobility: Secondary | ICD-10-CM

## 2015-05-26 DIAGNOSIS — I6980 Unspecified sequelae of other cerebrovascular disease: Secondary | ICD-10-CM | POA: Diagnosis not present

## 2015-05-26 DIAGNOSIS — R262 Difficulty in walking, not elsewhere classified: Secondary | ICD-10-CM | POA: Diagnosis not present

## 2015-05-26 NOTE — Therapy (Signed)
[facility] [address] [place], [place], [address] Phone: [phone]   Fax:  [phone]  Physical Therapy Treatment  Patient Details  Name: Diane Hutchinson MRN: [id number] Date of Birth: 04-16-1946 Referring Provider:  Charlett Blake, MD  Encounter Date: 05/26/2015      PT End of Session - 05/26/15 1358    Visit Number 8   Number of Visits 12   Date for PT Re-Evaluation 06/02/15   Authorization Type Medicare   Authorization - Visit Number 8   Authorization - Number of Visits 10   PT Start Time 1300   PT Stop Time 1345   PT Time Calculation (min) 45 min   Equipment Utilized During Treatment Gait belt   Activity Tolerance Patient tolerated treatment well;Patient limited by fatigue   Behavior During Therapy [facility] for tasks assessed/performed      Past Medical History  Diagnosis Date  . Migraine   . Depression   . Hypertension   . Stroke 2016  . COPD (chronic obstructive pulmonary disease)   . Anxiety     h/o of panic attack  . GERD (gastroesophageal reflux disease)     occas. use of TUMS  . Arthritis     knees    Past Surgical History  Procedure Laterality Date  . [place] surgery  2010  . Cholecystectomy      age 51  . Appendectomy      age 26  . Eye surgery  years ago    laser to both eyes for blocked tear ducts   . Brain surgery  1993    aneurysm  . Tubal ligation  years ago  . Hemorroidectomy  years ago  . Radiology with anesthesia N/A 01/20/2015    Procedure: RADIOLOGY WITH ANESTHESIA;  Surgeon: Luanne Bras, MD;  Location: [facility];  Service: Radiology;  Laterality: N/A;    There were no vitals filed for this visit.  Visit Diagnosis:  Hemiparesis, left  Difficulty walking  Abnormality of gait  Impaired functional mobility and activity tolerance      Subjective Assessment - 05/26/15 1333    Subjective Pt stated she fell off bed night before last, landed on Lt knee and back with pain  scale 6/10 Lt knee.  Husband wondering why pt. not receiving OT for Lt UE.   Currently in Pain? Yes   Pain Score 6    Pain Location Knee   Pain Orientation Left                         [facility] Adult PT Treatment/Exercise - 05/26/15 0001    Transfers   Sit to Stand 3: Mod assist   Sit to Stand Details (indicate cue type and reason) tactile cueing for Lt UE handplacement   Stand to Sit Details VC for controlled descent, hand and foot placement   Ambulation/Gait   Ambulation/Gait Yes   Ambulation/Gait Assistance 4: Min assist   Ambulation Distance (Feet) --  100 x 2 with 1# around each ankle during gait   Assistive device Rolling walker   Gait Pattern Decreased step length - right;Decreased stance time - left;Decreased weight shift to left   Ambulation Surface Level   Exercises   Exercises Knee/Hip;Lumbar   Lumbar Exercises: Quadruped   Other Quadruped Lumbar Exercises weight shifting on all fours, forward and backward crawling R/L   Knee/Hip Exercises: Seated   Long Arc Quad 10 reps   Long  Arc Quad Weight 2 lbs.                  PT Short Term Goals - 05/26/15 1408    PT SHORT TERM GOAL #1   Title Patient will complete functional transfers with min assist in order to decrease burden on caregivers and increase independence with functional mobility.    Status On-going   PT SHORT TERM GOAL #2   Title Pt will complete bed mobility with min assist to allow for increased independence.   Status On-going   PT SHORT TERM GOAL #3   Title Pt will ambulate 15 feet with RW with min assist in order to improve functional mobility and to allow for pt to ambulate in her home.    Status On-going           PT Long Term Goals - 05/26/15 1414    PT LONG TERM GOAL #1   Title Pt will complete functional transfers and bed mobility with supervision to allow for greater independence with self care.    PT LONG TERM GOAL #2   Title Pt will ambulate 150 feet with RW with  supervision to allow for independence with functional mobility and gait.    PT LONG TERM GOAL #3   Title Pt will demonstrate good static standing balance for 10 minutes to demonstrate improved functional activity tolerance and to allow for pt to complete self care.               Plan - 05/26/15 1400    Clinical Impression Statement Added weight around ankles during gait to improve proprioception, pt continues to require multimodal cueing during gait to improve posture, difficulty keeping Lt UE grip on RW and progressing Lt LE forward during gait; tendency to drag Lt LE when able to demonstrate ability to lift Lt LE forward.  Began quadruped activities to increase weight bearing Lt UE, improve balance and core strength to assist with posture during gait.  Pt very limited by fatiigue, no reports of pain at end of session.  Did F/U with secretary and OT apt has been made.     PT Next Visit Plan F/U with quadruped exercises.  Continue gait training, balance training, functional strength, transfer training. Trial light weights on L UE and L LE to improve proprioception.         Problem List Patient Active Problem List   Diagnosis Date Noted  . Cerebral infarction due to embolism of left carotid artery 03/04/2015  . Aneurysm, cerebral, nonruptured 03/04/2015  . History of ruptured arterial aneurysm 03/04/2015  . Essential hypertension 03/04/2015  . HLD (hyperlipidemia) 03/04/2015  . Alterations of sensations following CVA (cerebrovascular accident)   . Embolic cerebral infarction 01/22/2015  . Left hemiparesis 01/22/2015  . Stroke   . Cerebral embolism with cerebral infarction 01/21/2015  . Ataxia   . Brain aneurysm 01/20/2015  . TOBACCO ABUSE 01/04/2010  . BRONCHITIS, CHRONIC 01/04/2010  . CERVICAL CANCER 01/03/2010  . HYPERLIPIDEMIA 01/03/2010  . DEPRESSION 01/03/2010  . SUBARACHNOID HEMORRHAGE 01/03/2010  . [name] 01/03/2010   Ihor Austin, LPTA;  [place]  Aldona Lento 05/26/2015, 2:24 PM  [facility] [address] [place], [place], [address] Phone: [phone]   Fax:  [phone]

## 2015-05-28 ENCOUNTER — Telehealth (HOSPITAL_COMMUNITY): Payer: Self-pay | Admitting: Interventional Radiology

## 2015-05-28 ENCOUNTER — Encounter: Payer: Self-pay | Admitting: Physical Medicine & Rehabilitation

## 2015-05-28 ENCOUNTER — Other Ambulatory Visit (HOSPITAL_COMMUNITY): Payer: Self-pay | Admitting: Interventional Radiology

## 2015-05-28 ENCOUNTER — Encounter: Payer: Medicare Other | Attending: Physical Medicine & Rehabilitation

## 2015-05-28 ENCOUNTER — Ambulatory Visit (HOSPITAL_BASED_OUTPATIENT_CLINIC_OR_DEPARTMENT_OTHER): Payer: Medicare Other | Admitting: Physical Medicine & Rehabilitation

## 2015-05-28 VITALS — BP 124/76 | HR 74 | Resp 16

## 2015-05-28 DIAGNOSIS — G8194 Hemiplegia, unspecified affecting left nondominant side: Secondary | ICD-10-CM

## 2015-05-28 DIAGNOSIS — Z87891 Personal history of nicotine dependence: Secondary | ICD-10-CM | POA: Insufficient documentation

## 2015-05-28 DIAGNOSIS — G8190 Hemiplegia, unspecified affecting unspecified side: Secondary | ICD-10-CM

## 2015-05-28 DIAGNOSIS — R208 Other disturbances of skin sensation: Secondary | ICD-10-CM | POA: Diagnosis not present

## 2015-05-28 DIAGNOSIS — I69898 Other sequelae of other cerebrovascular disease: Secondary | ICD-10-CM | POA: Diagnosis not present

## 2015-05-28 DIAGNOSIS — R209 Unspecified disturbances of skin sensation: Secondary | ICD-10-CM

## 2015-05-28 DIAGNOSIS — R270 Ataxia, unspecified: Secondary | ICD-10-CM | POA: Insufficient documentation

## 2015-05-28 DIAGNOSIS — R278 Other lack of coordination: Secondary | ICD-10-CM | POA: Diagnosis not present

## 2015-05-28 DIAGNOSIS — I639 Cerebral infarction, unspecified: Secondary | ICD-10-CM

## 2015-05-28 DIAGNOSIS — I671 Cerebral aneurysm, nonruptured: Secondary | ICD-10-CM

## 2015-05-28 DIAGNOSIS — I69398 Other sequelae of cerebral infarction: Secondary | ICD-10-CM

## 2015-05-28 MED ORDER — SENNOSIDES-DOCUSATE SODIUM 8.6-50 MG PO TABS: 2.0000 | ORAL_TABLET | Freq: Two times a day (BID) | ORAL | Status: DC

## 2015-05-28 MED ORDER — DICLOFENAC SODIUM 1 % TD GEL: 2.0000 g | Freq: Four times a day (QID) | TRANSDERMAL | Status: DC

## 2015-05-28 NOTE — Telephone Encounter (Signed)
Called pt back, left another VM for her to call to schedule clinical visit. JM

## 2015-05-28 NOTE — Telephone Encounter (Signed)
Called pt to schedule her consult to discuss her second stage of treatment of her aneurysm and she asked me to call her back after 10 am as she was still sleeping. JM

## 2015-05-28 NOTE — Progress Notes (Signed)
Subjective:    Patient ID: Diane Hutchinson, female    DOB: [birth year], 69 y.o.   MRN: [id number] 68 y.o. right handed female with history of hypertension, migraine headaches as well as multiple intracranial aneurysms with right MCA aneurysmal clipping 1993 as well as coiling 2010 without residual weakness.. Independent/driving prior to admission living with her husband. Presented 01/20/2015 with decreased balance as well as headache. She had been seen by interventional radiology in the past with plan for image guided cerebral arteriogram for findings a left MCA aneurysm. Underwent left MCA elective embolization using stent assistance 01/20/2015 per Intervention radiology. Patient was extubated noted left-sided tingling and weakness. Code stroke was called. CT of the head showed no acute stroke. CTA was then performed again showing no large vessel occlusion. She was not a TPA candidate. Neurology consulted suspect non-dominant right brain infarct, embolic secondary to complication of interventional neuroradiology  HPI Since last visit patient has started outpatient OT in addition to PT. She is making some improvements however is disappointed in terms of her overall recovery from her stroke. Her husband needs to assist her with transfers. She has no functional uses of left upper extremity. She has not regained any sensation of her left upper extremity per her report  Pain Inventory Average Pain 6 Pain Right Now 5 My pain is burning  In the last 24 hours, has pain interfered with the following? General activity 4 Relation with others 0 Enjoyment of life 0 What TIME of day is your pain at its worst? evening and night Sleep (in general) NA  Pain is worse with: sitting Pain improves with: therapy/exercise Relief from Meds: no meds  Mobility walk with assistance use a walker ability to climb steps?  no do you drive?  no use a wheelchair needs help with transfers  Function disabled:  date disabled 1993 I need assistance with the following:  dressing, bathing, toileting and shopping  Neuro/Psych trouble walking  Prior Studies Any changes since last visit?  no  Physicians involved in your care Any changes since last visit?  no   Family History  Problem Relation Age of Onset  . Stroke Neg Hx    History   Social History  . Marital Status: Married    Spouse Name: N/A  . Number of Children: 1  . Years of Education: GED   Occupational History  . Retired     Social History Main Topics  . Smoking status: Former Smoker -- 1.00 packs/day for 8 years    Quit date: 01/20/2015  . Smokeless tobacco: Never Used  . Alcohol Use: No  . Drug Use: No  . Sexual Activity: Not on file   Other Topics Concern  . None   Social History Narrative   Patient lives at home with husband Diane Hutchinson   Patient has 1 child.    Patient has her GED   Patient is right handed.       Past Surgical History  Procedure Laterality Date  . [place] surgery  2010  . Cholecystectomy      age 76  . Appendectomy      age 28  . Eye surgery  years ago    laser to both eyes for blocked tear ducts   . Brain surgery  1993    aneurysm  . Tubal ligation  years ago  . Hemorroidectomy  years ago  . Radiology with anesthesia N/A 01/20/2015    Procedure: RADIOLOGY WITH ANESTHESIA;  Surgeon: Luanne Bras,  MD;  Location: [facility];  Service: Radiology;  Laterality: N/A;   Past Medical History  Diagnosis Date  . Migraine   . Depression   . Hypertension   . Stroke 2016  . COPD (chronic obstructive pulmonary disease)   . Anxiety     h/o of panic attack  . GERD (gastroesophageal reflux disease)     occas. use of TUMS  . Arthritis     knees   BP 124/76 mmHg  Pulse 74  Resp 16  SpO2 95%  Opioid Risk Score:   Fall Risk Score: High Fall Risk (>13 points) (previously educated and given handout and has PT working with her)`1  Depression screen PHQ 2/9  No flowsheet data found.  Review of  Systems  Musculoskeletal: Positive for gait problem.  Hematological: Bruises/bleeds easily.  All other systems reviewed and are negative.      Objective:   Physical Exam  Constitutional: She is oriented to person, place, and time. She appears well-developed and well-nourished.  HENT:  Head: Normocephalic and atraumatic.  Eyes: Conjunctivae and EOM are normal. Pupils are equal, round, and reactive to light.  Neurological: She is alert and oriented to person, place, and time.  Nursing note and vitals reviewed.  Sensory absent to light touch and proprioception in the left upper extremity Patient has a severe sensory ataxia and left upper extremity with finger-nose-finger testing Motor strength is 4/5 and left deltoid bicep tricep grip but has very poor motor control due to absent sensation Left lower extremity has  3 minus hip flexion and knee extension trace ankle dorsi flexion plantarflexion Speech without evidence of dysarthria Mood and affect are appropriate       Assessment & Plan:  1.Left hemiparesis and sensory deficits, probable thalamic infarct not seen on CT. I anticipate she will have poor recovery of left upper extremity function given her lack of improvement with sensation. Would recommend continue PT and OT for next month. When necessary physical medicine rehabilitation follow-up Neurology follow-up Primary care follow-up for CVA risk factors including Hyperlipidemia and hypertension

## 2015-05-28 NOTE — Patient Instructions (Signed)
If the Voltaren gel does not help for your knee we may consider injections.

## 2015-05-31 ENCOUNTER — Ambulatory Visit (HOSPITAL_COMMUNITY): Payer: Medicare Other | Admitting: Physical Therapy

## 2015-05-31 DIAGNOSIS — R262 Difficulty in walking, not elsewhere classified: Secondary | ICD-10-CM

## 2015-05-31 DIAGNOSIS — M6289 Other specified disorders of muscle: Secondary | ICD-10-CM | POA: Diagnosis not present

## 2015-05-31 DIAGNOSIS — R269 Unspecified abnormalities of gait and mobility: Secondary | ICD-10-CM

## 2015-05-31 DIAGNOSIS — I6980 Unspecified sequelae of other cerebrovascular disease: Secondary | ICD-10-CM | POA: Diagnosis not present

## 2015-05-31 DIAGNOSIS — Z7409 Other reduced mobility: Secondary | ICD-10-CM

## 2015-05-31 DIAGNOSIS — G8194 Hemiplegia, unspecified affecting left nondominant side: Secondary | ICD-10-CM

## 2015-05-31 DIAGNOSIS — G8190 Hemiplegia, unspecified affecting unspecified side: Secondary | ICD-10-CM | POA: Diagnosis not present

## 2015-05-31 NOTE — Therapy (Signed)
[name], [place], [address] Phone: [phone]   Fax:  [phone]  Physical Therapy Treatment  Patient Details  Name: Diane Hutchinson MRN: [id number] Date of Birth: 26-Apr-1946 Referring Provider:  Charlett Blake, MD  Encounter Date: 05/31/2015      PT End of Session - 05/31/15 1449    Visit Number 9   Number of Visits 12   Date for PT Re-Evaluation 06/02/15   Authorization Type Medicare   Authorization - Visit Number 9   Authorization - Number of Visits 10   PT Start Time 1324   PT Stop Time 1430   PT Time Calculation (min) 38 min   Activity Tolerance Patient tolerated treatment well   Behavior During Therapy [facility] for tasks assessed/performed      Past Medical History  Diagnosis Date  . Migraine   . Depression   . Hypertension   . Stroke 2016  . COPD (chronic obstructive pulmonary disease)   . Anxiety     h/o of panic attack  . GERD (gastroesophageal reflux disease)     occas. use of TUMS  . Arthritis     knees    Past Surgical History  Procedure Laterality Date  . [place] surgery  2010  . Cholecystectomy      age 63  . Appendectomy      age 2  . Eye surgery  years ago    laser to both eyes for blocked tear ducts   . Brain surgery  1993    aneurysm  . Tubal ligation  years ago  . Hemorroidectomy  years ago  . Radiology with anesthesia N/A 01/20/2015    Procedure: RADIOLOGY WITH ANESTHESIA;  Surgeon: Luanne Bras, MD;  Location: [facility];  Service: Radiology;  Laterality: N/A;    There were no vitals filed for this visit.  Visit Diagnosis:  Hemiparesis, left  Difficulty walking  Abnormality of gait  Impaired functional mobility and activity tolerance      Subjective Assessment - 05/31/15 1437    Subjective Patient and her spouse state they are doing well, no pain today and no falls since last session    Pertinent History Pt has history of multiple intracranial  aneurysms with Lt MCA elective embolization using stent assistance 01/20/15. Neuro consult suspects non-dominant R brain infarct secondary to complication of embolization.    Currently in Pain? No/denies                         [facility] Adult PT Treatment/Exercise - 05/31/15 0001    Ambulation/Gait   Ambulation/Gait Yes   Ambulation/Gait Assistance 4: Min assist   Assistive device Rolling walker   Gait Pattern Decreased step length - right;Decreased stance time - left;Decreased weight shift to left   Ambulation Surface Level   Gait Comments difficulty navigating un-predictable surfaces such as rugs in back of clinic    Knee/Hip Exercises: Standing   Heel Raises Both;1 set;10 reps   Forward Lunges Both;1 set;10 reps   Forward Lunges Limitations Mod(A) to safely perform with L LE in stance    Other Standing Knee Exercises weight shifting side to side with cues for posture in parallel bars, focus on weight bearing through L UE and L LE    Other Standing Knee Exercises mini-squat 1x10 with L knee block in parallel bars; sit to stand in staggered stance with Mod(A) with L LE staggered back  Knee/Hip Exercises: Seated   Long Arc Quad 10 reps   Long Arc Quad Weight 1 lbs.   Long Arc Quad Limitations neuro facilitation for isometric hold                 PT Education - 05/31/15 1448    Education provided Yes   Education Details education on purpose of neuro facilitations, possible signs of neuro healing    Person(s) Educated Patient;Spouse   Methods Explanation   Comprehension Verbalized understanding          PT Short Term Goals - 05/26/15 1408    PT SHORT TERM GOAL #1   Title Patient will complete functional transfers with min assist in order to decrease burden on caregivers and increase independence with functional mobility.    Status On-going   PT SHORT TERM GOAL #2   Title Pt will complete bed mobility with min assist to allow for increased independence.    Status On-going   PT SHORT TERM GOAL #3   Title Pt will ambulate 15 feet with RW with min assist in order to improve functional mobility and to allow for pt to ambulate in her home.    Status On-going           PT Long Term Goals - 05/26/15 1414    PT LONG TERM GOAL #1   Title Pt will complete functional transfers and bed mobility with supervision to allow for greater independence with self care.    PT LONG TERM GOAL #2   Title Pt will ambulate 150 feet with RW with supervision to allow for independence with functional mobility and gait.    PT LONG TERM GOAL #3   Title Pt will demonstrate good static standing balance for 10 minutes to demonstrate improved functional activity tolerance and to allow for pt to complete self care.               Plan - 05/31/15 1450    Clinical Impression Statement Focused on weight bearing functional strengthening and activities today with L knee block and Mod(A) to maintain optimal positioning of L LE during tasks. Re-trialed light weights today with no clear improvements in proprioception during session. Gait approx 142ft with rolling walker and Min(A), cues for safety using assistive device, difficulty in navigating uneven unpredictable obstacles such as throw rugs in back of clinic. Also introduced neuro facilitation for isometric LAQs today.    Pt will benefit from skilled therapeutic intervention in order to improve on the following deficits Abnormal gait;Decreased activity tolerance;Decreased balance;Decreased coordination;Decreased mobility;Decreased strength;Difficulty walking;Pain   Rehab Potential Good   PT Frequency 2x / week   PT Duration 6 weeks   PT Treatment/Interventions ADLs/Self Care Home Management;Gait training;Functional mobility training;Therapeutic activities;Therapeutic exercise;Balance training;Neuromuscular re-education;Patient/family education;Manual techniques   PT Next Visit Plan F/U with quadruped exercises with caution  for position of L UE.  Continue gait training, balance training, functional strength, transfer training. Weight bearing through L LE and UE as tolerated.    Consulted and Agree with Plan of Care Patient;Family member/caregiver        Problem List Patient Active Problem List   Diagnosis Date Noted  . Cerebral infarction due to embolism of left carotid artery 03/04/2015  . Aneurysm, cerebral, nonruptured 03/04/2015  . History of ruptured arterial aneurysm 03/04/2015  . Essential hypertension 03/04/2015  . HLD (hyperlipidemia) 03/04/2015  . Alterations of sensations following CVA (cerebrovascular accident)   . Embolic cerebral infarction 01/22/2015  . Left hemiparesis  01/22/2015  . Stroke   . Cerebral embolism with cerebral infarction 01/21/2015  . Ataxia   . Brain aneurysm 01/20/2015  . TOBACCO ABUSE 01/04/2010  . BRONCHITIS, CHRONIC 01/04/2010  . CERVICAL CANCER 01/03/2010  . HYPERLIPIDEMIA 01/03/2010  . DEPRESSION 01/03/2010  . SUBARACHNOID HEMORRHAGE 01/03/2010  . [name] 01/03/2010    Deniece Ree PT, DPT [facility] [address] [place], [place], [address] Phone: [phone]   Fax:  [phone]

## 2015-06-02 ENCOUNTER — Ambulatory Visit (HOSPITAL_COMMUNITY): Payer: Medicare Other | Admitting: Physical Therapy

## 2015-06-02 ENCOUNTER — Ambulatory Visit (HOSPITAL_COMMUNITY): Payer: Medicare Other | Admitting: Occupational Therapy

## 2015-06-02 DIAGNOSIS — R29898 Other symptoms and signs involving the musculoskeletal system: Secondary | ICD-10-CM

## 2015-06-02 DIAGNOSIS — Z789 Other specified health status: Secondary | ICD-10-CM

## 2015-06-02 DIAGNOSIS — I6980 Unspecified sequelae of other cerebrovascular disease: Secondary | ICD-10-CM | POA: Diagnosis not present

## 2015-06-02 DIAGNOSIS — R269 Unspecified abnormalities of gait and mobility: Secondary | ICD-10-CM

## 2015-06-02 DIAGNOSIS — Z7409 Other reduced mobility: Secondary | ICD-10-CM

## 2015-06-02 DIAGNOSIS — R262 Difficulty in walking, not elsewhere classified: Secondary | ICD-10-CM

## 2015-06-02 DIAGNOSIS — R531 Weakness: Secondary | ICD-10-CM

## 2015-06-02 DIAGNOSIS — G8190 Hemiplegia, unspecified affecting unspecified side: Secondary | ICD-10-CM | POA: Diagnosis not present

## 2015-06-02 DIAGNOSIS — M6289 Other specified disorders of muscle: Secondary | ICD-10-CM | POA: Diagnosis not present

## 2015-06-02 DIAGNOSIS — G8194 Hemiplegia, unspecified affecting left nondominant side: Secondary | ICD-10-CM

## 2015-06-02 DIAGNOSIS — IMO0002 Reserved for concepts with insufficient information to code with codable children: Secondary | ICD-10-CM

## 2015-06-02 NOTE — Patient Instructions (Signed)
Home Exercises Program Theraputty Exercises  Do the following exercises 2 times a day using your affected hand.  1. Roll putty into a ball.  2. Make into a pancake.  3. Roll putty into a roll.  4. Pinch along log with first finger and thumb.   5. Make into a ball.  6. Roll it back into a log.   7. Pinch using thumb and side of first finger.  8. Roll into a ball, then flatten into a pancake.  9. Using your fingers, make putty into a mountain.   

## 2015-06-02 NOTE — Therapy (Signed)
[place], [place], [address] Phone: [phone]   Fax:  [phone]  Occupational Therapy Evaluation  Patient Details  Name: Diane Hutchinson MRN: [id number] Date of Birth: 12-Aug-1946 Referring Provider:  Charlett Blake, MD  Encounter Date: 06/02/2015      OT End of Session - 06/02/15 1658    Visit Number 1   Number of Visits 18   Date for OT Re-Evaluation 08/01/15  mini reassessment 06/30/2015   Authorization Type Medicare/Medicare A & B   Authorization Time Period Before 10th visit   Authorization - Visit Number 1   Authorization - Number of Visits 10   OT Start Time 1346   OT Stop Time 1430   OT Time Calculation (min) 44 min   Activity Tolerance Patient tolerated treatment well   Behavior During Therapy [facility] for tasks assessed/performed      Past Medical History  Diagnosis Date  . Migraine   . Depression   . Hypertension   . Stroke 2016  . COPD (chronic obstructive pulmonary disease)   . Anxiety     h/o of panic attack  . GERD (gastroesophageal reflux disease)     occas. use of TUMS  . Arthritis     knees    Past Surgical History  Procedure Laterality Date  . [place] surgery  2010  . Cholecystectomy      age 69  . Appendectomy      age 69  . Eye surgery  years ago    laser to both eyes for blocked tear ducts   . Brain surgery  1993    aneurysm  . Tubal ligation  years ago  . Hemorroidectomy  years ago  . Radiology with anesthesia N/A 01/20/2015    Procedure: RADIOLOGY WITH ANESTHESIA;  Surgeon: Luanne Bras, MD;  Location: [facility];  Service: Radiology;  Laterality: N/A;    There were no vitals filed for this visit.  Visit Diagnosis:  Hemiparesis, left - Plan: Ot plan of care cert/re-cert  Decreased grip strength of left hand - Plan: Ot plan of care cert/re-cert  Decreased pinch strength - Plan: Ot plan of care cert/re-cert  Decreased activities of daily living (ADL) -  Plan: Ot plan of care cert/re-cert  Lack of coordination due to stroke - Plan: Ot plan of care cert/re-cert  Left-sided weakness - Plan: Ot plan of care cert/re-cert      Subjective Assessment - 06/02/15 1700    Subjective  S: I just can't seem to use this hand for anything.    Patient is accompained by: Family member  husband   Pertinent History Pt is a 69 y/o female s/p R CVA on 01/20/15, resulting in left hemiparesis. Pt has a history of multiple intracranial aneurysms with Lt MCA elective embolization using stent assistance 01/20/15. Neuro consult suspects non-dominant R brain infarct secondary to complication of embolization. Pt referred to occupational therapy for evaluation and treatment by Dr. Alysia Penna.    Special Tests FOTO Score: 48/100 (52% impairment)   Patient Stated Goals To use my left hand like I used to.    Currently in Pain? No/denies           [facility] OT Assessment - 06/02/15 1347    Assessment   Diagnosis CVA-left sided weakness   Onset Date 01/20/15   Prior Therapy CIR; Home health services-approximately 6 weeks   Precautions   Precautions Fall   Required Braces or Orthoses Other  Brace/Splint   Other Brace/Splint Lt AFO   Restrictions   Weight Bearing Restrictions No   Balance Screen   Has the patient fallen in the past 6 months Yes   How many times? multiple   Home  Environment   Family/patient expects to be discharged to: Private residence   Living Arrangements Spouse/significant other   Available Help at Discharge Family   Type of [place];Tub/Shower unit;Door   [employer] Theme park manager - [employer] - 2 wheels;Shower seat;Bedside commode;Hand held Merchant navy officer   Lives With Spouse   Prior Function   Level of Independence Independent with basic ADLs   Leisure Pt enjoys mowing her lawn   ADL   Eating/Feeding Modified independent    Grooming Modified independent   Lower Body Bathing Maximal assistance   Upper Body Dressing Moderate assistance   Lower Body Dressing Moderate assistance  pt able to donn pants; husband assists with socks/shoes   Toilet Tranfer Modified independent  from w/c to bedside commode   Toileting - Clothing Manipulation Maximal assistance   Toileting -  Hygiene Modified Independent   Tub/Shower Transfer Maximal assistance  pt has transferred to shower 1x; sponges bathes mostly   Tub/Shower Transfer Method Museum/gallery exhibitions officer seat without back   Mobility   Mobility Status History of falls   Mobility Status Comments Pt uses wheelchair for primary mode of transportation. Husband reports he assists her during ambulation with 2 wheeled walker and hemi-walker   Written Expression   Dominant Hand Right   Vision - History   Baseline Vision Wears glasses all the time   Cognition   Overall Cognitive Status Within Functional Limits for tasks assessed   Sensation   Light Touch Impaired by gross assessment   Stereognosis Impaired by gross assessment   Coordination   Gross Motor Movements are Fluid and Coordinated No   Fine Motor Movements are Fluid and Coordinated No   Coordination and Movement Description Pt demonstrates ataxic movements of LUE, both GMC and FMC   9 Hole Peg Test Left;Right   Right 9 Hole Peg Test 31.63"   Left 9 Hole Peg Test unable to complete   ROM / Strength   AROM / PROM / Strength AROM;Strength   AROM   Overall AROM  Within functional limits for tasks performed  LUE movements are slightly ataxic   Overall AROM Comments Assessed in seated    AROM Assessment Site Shoulder;Elbow;Wrist;Forearm   Right/Left Shoulder Left   Strength   Overall Strength Comments Assessed in seated, ER/IR adducted   Strength Assessment Site Shoulder;Elbow;Wrist;Hand   Right/Left Shoulder Left   Left Shoulder Flexion 3+/5   Left Shoulder ABduction 3+/5   Left  Shoulder Internal Rotation 3+/5   Left Shoulder External Rotation 3+/5   Right/Left Elbow Left   Left Elbow Flexion 4-/5   Left Elbow Extension 4-/5   Right/Left hand Left;Right   Right Hand Gross Grasp Functional   Right Hand Grip (lbs) 53   Right Hand Lateral Pinch 9 lbs   Right Hand 3 Point Pinch 6 lbs   Left Hand Gross Grasp Impaired   Left Hand Grip (lbs) 24   Left Hand Lateral Pinch 5 lbs   Left Hand 3 Point Pinch 3 lbs  OT Education - 2015-07-02 1657    Education provided Yes   Education Details red theraputty for grip strengthening   Person(s) Educated Patient;Spouse   Methods Explanation;Demonstration;Handout   Comprehension Verbalized understanding;Returned demonstration          OT Short Term Goals - 2015-07-02 1709    OT SHORT TERM GOAL #1   Title Pt will be educated on and independent in HEP.    Time 4   Period Weeks   Status New   OT SHORT TERM GOAL #2   Title Pt will increase LUE strength to 4/5 to increase ability to assist in donning shirts.    Time 4   Period Weeks   Status New   OT SHORT TERM GOAL #3   Title Patient will increase grip strength by 5# and pinch strength by 3# to increase ability to hold coffee cup.    Time 4   Period Weeks   Status New   OT SHORT TERM GOAL #4   Title Patient will increase fine motor coordination by completing 9 hole peg test within 2 minutes.    Time 4   Period Weeks   Status New           OT Long Term Goals - July 02, 2015 1711    OT LONG TERM GOAL #1   Title Pt will complete all BADL tasks at Mod I level, including dressing, bathing, and meal preparation.    Time 8   Period Weeks   Status New   OT LONG TERM GOAL #2   Title Pt will increase LUE strength to 4+/5 to increase ability to complete daily tasks.    Time 8   Period Weeks   Status New   OT LONG TERM GOAL #3   Title Patient will increase grip strength by 10# and pinch strength by 5# to increase ability to grasp  walker or surfaces during transfers and functional mobility tasks.    Time 8   Period Weeks   Status New   OT LONG TERM GOAL #4   Title Pt will increase fine motor coordination by completing 9 hole peg test in under 1 minute.    Time 8   Period Weeks   Status New               Plan - 07/02/2015 1704    Clinical Impression Statement A: Pt is a 69 y/o female s/p R CVA on 01/20/15 resulting in left sided weakness, causing decreased strength, impaired sensation decreased fine and gross motor coordination, decreased fluidity of movement, and balance on L side resulting in difficulty completing daily and work related tasks independently.    Pt will benefit from skilled therapeutic intervention in order to improve on the following deficits (Retired) Decreased strength;Impaired sensation;Decreased knowledge of use of DME;Impaired UE functional use;Decreased activity tolerance;Decreased safety awareness;Decreased coordination;Decreased endurance;Decreased range of motion   Rehab Potential Good   OT Frequency 3x / week   OT Duration 6 weeks   OT Treatment/Interventions Self-care/ADL training;DME and/or AE instruction;Passive range of motion;Patient/family education;Electrical Stimulation;Contrast Bath;Moist Heat;Therapeutic exercise;Manual Therapy;Therapeutic activities   Plan P: Patient will benefit from skilled OT services to increase functional performance during daily and work related tasks. Treatment Plan: LUE strengthening, grip and pinch strengthening, fine and gross motor coordination tasks,and balance activities.     Consulted and Agree with Plan of Care Patient          G-Codes - 07/02/15 1718    Functional Assessment Tool  Used FOTO Score: 48/100 (52% impairment)   Functional Limitation Self care   Self Care Current Status (G2836) At least 40 percent but less than 60 percent impaired, limited or restricted   Self Care Goal Status (O2947) At least 1 percent but less than 20 percent  impaired, limited or restricted      Problem List Patient Active Problem List   Diagnosis Date Noted  . Cerebral infarction due to embolism of left carotid artery 03/04/2015  . Aneurysm, cerebral, nonruptured 03/04/2015  . History of ruptured arterial aneurysm 03/04/2015  . Essential hypertension 03/04/2015  . HLD (hyperlipidemia) 03/04/2015  . Alterations of sensations following CVA (cerebrovascular accident)   . Embolic cerebral infarction 01/22/2015  . Left hemiparesis 01/22/2015  . Stroke   . Cerebral embolism with cerebral infarction 01/21/2015  . Ataxia   . Brain aneurysm 01/20/2015  . TOBACCO ABUSE 01/04/2010  . BRONCHITIS, CHRONIC 01/04/2010  . CERVICAL CANCER 01/03/2010  . HYPERLIPIDEMIA 01/03/2010  . DEPRESSION 01/03/2010  . SUBARACHNOID HEMORRHAGE 01/03/2010  . [name] 01/03/2010    Guadelupe Sabin, OTR/L  [phone] 06/02/2015, 5:22 PM  [place] [address] [place], [place], [address] Phone: [phone]   Fax:  [phone]

## 2015-06-02 NOTE — Therapy (Signed)
[facility] [address] [place], [place], [address] Phone: [phone]   Fax:  [phone]  Physical Therapy Treatment (Reassessment)  Patient Details  Name: Diane Hutchinson MRN: [id number] Date of Birth: 1946/10/31 Referring Provider:  Charlett Blake, MD  Encounter Date: 06/02/2015      PT End of Session - 06/02/15 1502    Visit Number 10   Number of Visits 18   Date for PT Re-Evaluation 07/03/15   Authorization Type Medicare   Authorization - Visit Number 10   Authorization - Number of Visits 20   PT Start Time 7412   PT Stop Time 1346   PT Time Calculation (min) 33 min   Equipment Utilized During Treatment Gait belt   Activity Tolerance Patient tolerated treatment well   Behavior During Therapy [facility] for tasks assessed/performed      Past Medical History  Diagnosis Date  . Migraine   . Depression   . Hypertension   . Stroke 2016  . COPD (chronic obstructive pulmonary disease)   . Anxiety     h/o of panic attack  . GERD (gastroesophageal reflux disease)     occas. use of TUMS  . Arthritis     knees    Past Surgical History  Procedure Laterality Date  . [place] surgery  2010  . Cholecystectomy      age 25  . Appendectomy      age 69  . Eye surgery  years ago    laser to both eyes for blocked tear ducts   . Brain surgery  1993    aneurysm  . Tubal ligation  years ago  . Hemorroidectomy  years ago  . Radiology with anesthesia N/A 01/20/2015    Procedure: RADIOLOGY WITH ANESTHESIA;  Surgeon: Luanne Bras, MD;  Location: [place];  Service: Radiology;  Laterality: N/A;    There were no vitals filed for this visit.  Visit Diagnosis:  Hemiparesis, left  Difficulty walking  Abnormality of gait  Impaired functional mobility and activity tolerance      Subjective Assessment - 06/02/15 1322    Subjective Pt and spouse report that they have seen a great deal of improvement. She was able to get up off  of the couch, which was the first time in a long while. She reports that walking, standing from a chair, and her balance have all improved. A main concern that she has is the fact that her hand slides off of her walker.    Patient is accompained by: Family member   How long can you stand comfortably? 15-20 minutes   How long can you walk comfortably? 10-15 minutes   Currently in Pain? No/denies            [facility] PT Assessment - 06/02/15 0001    Observation/Other Assessments   Focus on Therapeutic Outcomes (FOTO)  Score: 48. 52% limitation   Strength   Right Hip Flexion 4+/5   Right Hip Extension 4-/5   Right Hip ABduction 4-/5   Left Hip Flexion 3+/5   Left Hip Extension 2+/5   Left Hip ABduction 2+/5   Right Knee Flexion 4/5   Right Knee Extension 4+/5   Left Knee Flexion 3+/5   Left Knee Extension 4/5   Bed Mobility   Rolling Right 6: Modified independent (Device/Increase time)   Rolling Left 6: Modified independent (Device/Increase time)   Supine to Sit 5: Supervision   Sit to Supine 5: Supervision  Transfers   Transfers Sit to Stand;Stand to Sit;Stand Pivot Transfers                     [facility] Adult PT Treatment/Exercise - 06/02/15 0001    Transfers   Five time sit to stand comments  32.94 seconds- last 2 reps performed with one UE support   Sit to Stand 4: Min guard;5: Engineer, water Details (indicate cue type and reason) Supervision   Ambulation/Gait   Ambulation/Gait Yes   Ambulation/Gait Assistance 4: Min assist   Ambulation Distance (Feet) 115 Feet   Assistive device Rolling walker   Gait Pattern Decreased step length - right;Decreased stance time - left;Decreased weight shift to left                PT Education - 06/02/15 1500    Education provided Yes   Education Details education on progress, goals   Person(s) Educated Patient;Spouse   Methods Explanation   Comprehension Verbalized understanding          PT  Short Term Goals - 06/02/15 1506    PT SHORT TERM GOAL #1   Title Patient will complete functional transfers with min assist in order to decrease burden on caregivers and increase independence with functional mobility.    Baseline Mod assist   Time 3   Period Weeks   Status Achieved  Pt requires supervision-CGA to complete transfers   PT SHORT TERM GOAL #2   Title Pt will complete bed mobility with min assist to allow for increased independence.   Baseline Mod assist   Time 3   Period Weeks   Status Achieved  Pt requires supervision-min A to complete bed mobility depending on level of fatigue   PT SHORT TERM GOAL #3   Title Pt will ambulate 15 feet with RW with min assist in order to improve functional mobility and to allow for pt to ambulate in her home.    Time 3   Period Weeks   Status Achieved   PT SHORT TERM GOAL #4   Title Pt will complete five time sit to stand test in 27 seconds to demonstrate improve BLE strength and functional mobility.    Time 2   Period Weeks   Status New           PT Long Term Goals - 06/02/15 1508    PT LONG TERM GOAL #1   Title Pt will complete functional transfers and bed mobility with supervision to allow for greater independence with self care.    Time 6   Period Weeks   Status Partially Met  Pt requires CGA-min A at times depending on level of fatigue   PT LONG TERM GOAL #2   Title Pt will ambulate 150 feet with RW with supervision to allow for independence with functional mobility and gait.    Time 6   Period Weeks   Status On-going   PT LONG TERM GOAL #3   Title Pt will demonstrate good static standing balance for 10 minutes to demonstrate improved functional activity tolerance and to allow for pt to complete self care.   Time 6   Period Weeks   Status On-going   PT LONG TERM GOAL #4   Title Pt will complete five time sit to stand in 23 seconds to demonstrate improved BLE strength and functional mobliity.    Time 6   Period  Weeks   Status New  Plan - 06-25-15 1504    Clinical Impression Statement Reassessment completed today. Pt has made steady progress towards her LTGs in regards to strength, ambulation, functional activity tolerance, and balance. She will benefit from continuing with skilled PT 2x a week x 4 more weeks in order to further address her impairments in gait, strength, and balance in order to return pt to PLOF, improve quality of life, and reduce caregiver burden. Pt and spouse report that she has had less difficulty with walking in the home, and they are both very motivated to continue to make progress with PT.    PT Frequency 2x / week   PT Duration 4 weeks   PT Treatment/Interventions ADLs/Self Care Home Management;Gait training;Functional mobility training;Therapeutic activities;Therapeutic exercise;Balance training;Neuromuscular re-education;Patient/family education;Manual techniques   PT Next Visit Plan F/U with quadruped exercises with caution for position of L UE.  Continue gait training, balance training, functional strength, transfer training. Weight bearing through L LE and UE as tolerated.           G-Codes - June 25, 2015 1510    Functional Assessment Tool Used FOTO   Functional Limitation Mobility: Walking and moving around   Mobility: Walking and Moving Around Current Status [phone]) At least 40 percent but less than 60 percent impaired, limited or restricted   Mobility: Walking and Moving Around Goal Status [phone]) At least 40 percent but less than 60 percent impaired, limited or restricted      Problem List Patient Active Problem List   Diagnosis Date Noted  . Cerebral infarction due to embolism of left carotid artery 03/04/2015  . Aneurysm, cerebral, nonruptured 03/04/2015  . History of ruptured arterial aneurysm 03/04/2015  . Essential hypertension 03/04/2015  . HLD (hyperlipidemia) 03/04/2015  . Alterations of sensations following CVA (cerebrovascular  accident)   . Embolic cerebral infarction 01/22/2015  . Left hemiparesis 01/22/2015  . Stroke   . Cerebral embolism with cerebral infarction 01/21/2015  . Ataxia   . Brain aneurysm 01/20/2015  . TOBACCO ABUSE 01/04/2010  . BRONCHITIS, CHRONIC 01/04/2010  . CERVICAL CANCER 01/03/2010  . HYPERLIPIDEMIA 01/03/2010  . DEPRESSION 01/03/2010  . SUBARACHNOID HEMORRHAGE 01/03/2010  . [name] 01/03/2010    Hilma Favors, PT, DPT [phone] 06/25/2015, 3:12 PM  [facility] [address] , [place], [address] Phone: [phone]   Fax:  [phone]

## 2015-06-03 DIAGNOSIS — F332 Major depressive disorder, recurrent severe without psychotic features: Secondary | ICD-10-CM | POA: Diagnosis not present

## 2015-06-04 ENCOUNTER — Ambulatory Visit (HOSPITAL_COMMUNITY): Payer: Medicare Other

## 2015-06-04 ENCOUNTER — Ambulatory Visit (HOSPITAL_COMMUNITY): Payer: Medicare Other | Admitting: Occupational Therapy

## 2015-06-04 ENCOUNTER — Encounter (HOSPITAL_COMMUNITY): Payer: Self-pay | Admitting: Occupational Therapy

## 2015-06-04 DIAGNOSIS — I6980 Unspecified sequelae of other cerebrovascular disease: Secondary | ICD-10-CM | POA: Diagnosis not present

## 2015-06-04 DIAGNOSIS — Z789 Other specified health status: Secondary | ICD-10-CM

## 2015-06-04 DIAGNOSIS — IMO0002 Reserved for concepts with insufficient information to code with codable children: Secondary | ICD-10-CM

## 2015-06-04 DIAGNOSIS — R262 Difficulty in walking, not elsewhere classified: Secondary | ICD-10-CM | POA: Diagnosis not present

## 2015-06-04 DIAGNOSIS — R269 Unspecified abnormalities of gait and mobility: Secondary | ICD-10-CM

## 2015-06-04 DIAGNOSIS — R531 Weakness: Secondary | ICD-10-CM

## 2015-06-04 DIAGNOSIS — G8194 Hemiplegia, unspecified affecting left nondominant side: Secondary | ICD-10-CM

## 2015-06-04 DIAGNOSIS — Z7409 Other reduced mobility: Secondary | ICD-10-CM

## 2015-06-04 DIAGNOSIS — G8190 Hemiplegia, unspecified affecting unspecified side: Secondary | ICD-10-CM | POA: Diagnosis not present

## 2015-06-04 DIAGNOSIS — M6289 Other specified disorders of muscle: Secondary | ICD-10-CM | POA: Diagnosis not present

## 2015-06-04 DIAGNOSIS — R29898 Other symptoms and signs involving the musculoskeletal system: Secondary | ICD-10-CM

## 2015-06-04 NOTE — Therapy (Signed)
[facility] [place], [place], [address] Phone: [phone]   Fax:  [phone]  Occupational Therapy Treatment  Patient Details  Name: Diane Hutchinson MRN: [id number] Date of Birth: 01/25/1946 Referring Provider:  Neale Burly, MD  Encounter Date: 06/04/2015      OT End of Session - 06/04/15 1503    Visit Number 2   Number of Visits 18   Date for OT Re-Evaluation 08/01/15  mini reassessment 06/30/2015   Authorization Type Medicare/Medicare A & B   Authorization Time Period Before 10th visit   Authorization - Visit Number 2   Authorization - Number of Visits 10   OT Start Time 1430   OT Stop Time 1455   OT Time Calculation (min) 25 min   Activity Tolerance Patient tolerated treatment well   Behavior During Therapy [facility] for tasks assessed/performed      Past Medical History  Diagnosis Date  . Migraine   . Depression   . Hypertension   . Stroke 2016  . COPD (chronic obstructive pulmonary disease)   . Anxiety     h/o of panic attack  . GERD (gastroesophageal reflux disease)     occas. use of TUMS  . Arthritis     knees    Past Surgical History  Procedure Laterality Date  . [place] surgery  2010  . Cholecystectomy      age 14  . Appendectomy      age 56  . Eye surgery  years ago    laser to both eyes for blocked tear ducts   . Brain surgery  1993    aneurysm  . Tubal ligation  years ago  . Hemorroidectomy  years ago  . Radiology with anesthesia N/A 01/20/2015    Procedure: RADIOLOGY WITH ANESTHESIA;  Surgeon: Luanne Bras, MD;  Location: [place];  Service: Radiology;  Laterality: N/A;    There were no vitals filed for this visit.  Visit Diagnosis:  Lack of coordination due to stroke  Left-sided weakness  Decreased grip strength of left hand  Decreased pinch strength  Decreased activities of daily living (ADL)      Subjective Assessment - 06/04/15 1459    Subjective  S: That hand  just won't do right.    Currently in Pain? No/denies            [facility] OT Assessment - 06/04/15 1459    Assessment   Diagnosis CVA-left sided weakness   Precautions   Precautions Fall                  OT Treatments/Exercises (OP) - 06/04/15 1459    Exercises   Exercises Shoulder;Wrist;Hand;Elbow   Shoulder Exercises: Supine   Protraction Strengthening;10 reps   Protraction Weight (lbs) 1   Other Supine Exercises shoulder press 10X 1#   Elbow Exercises   Bar Weights/Barbell (Elbow Flexion) 1 lb  10X   Elbow Extension Strengthening;10 reps   Bar Weights/Barbell (Elbow Extension) 1 lb   Wrist Flexion Strengthening;10 reps   Bar Weights/Barbell (Wrist Flexion) 1 lb   Wrist Extension Strengthening;10 reps   Bar Weights/Barbell (Wrist Extension) 1 lb   Additional Elbow Exercises   Hand Gripper with Large Beads 6/6 beads gripper set at 7#   Hand Gripper with Medium Beads 13/13 beads gripper set at 7#   Additional Wrist Exercises   Sponges 6, 6    Fine Motor Coordination   Other Fine Motor Exercises Pt  attempted to use yellow resistive clothespin to grasp high resistance sponges and place in bucket. pt unable to complete task due to ataxic movements and being unable to maintain pinch grip on clothespin                  OT Short Term Goals - 06/04/15 1506    OT SHORT TERM GOAL #1   Title Pt will be educated on and independent in HEP.    Time 4   Period Weeks   Status On-going   OT SHORT TERM GOAL #2   Title Pt will increase LUE strength to 4/5 to increase ability to assist in donning shirts.    Time 4   Period Weeks   Status On-going   OT SHORT TERM GOAL #3   Title Patient will increase grip strength by 5# and pinch strength by 3# to increase ability to hold coffee cup.    Time 4   Period Weeks   Status On-going   OT SHORT TERM GOAL #4   Title Patient will increase fine motor coordination by completing 9 hole peg test within 2 minutes.    Time 4    Period Weeks   Status On-going           OT Long Term Goals - 06/04/15 1506    OT LONG TERM GOAL #1   Title Pt will complete all BADL tasks at Mod I level, including dressing, bathing, and meal preparation.    Time 8   Period Weeks   Status On-going   OT LONG TERM GOAL #2   Title Pt will increase LUE strength to 4+/5 to increase ability to complete daily tasks.    Time 8   Period Weeks   Status On-going   OT LONG TERM GOAL #3   Title Patient will increase grip strength by 10# and pinch strength by 5# to increase ability to grasp walker or surfaces during transfers and functional mobility tasks.    Time 8   Period Weeks   Status On-going   OT LONG TERM GOAL #4   Title Pt will increase fine motor coordination by completing 9 hole peg test in under 1 minute.    Time 8   Period Weeks   Status On-going               Plan - 06/04/15 1503    Clinical Impression Statement A: Initiated grip and pinch strengthening, LUE exercises this session. Pt with mod difficulty during shoulder/elbow exercises due to ataxic movements and being unable to feel weight in hand. Pt reports she has constant numbness and experiences discomfort in her fingers during pinch exercises. Pt requested to leave early due to appt at 3:30.    Plan P: Contrast bath, attempt small beads using 1# wrist weight to assist in stabilizing wrist and hand during gripper task.         Problem List Patient Active Problem List   Diagnosis Date Noted  . Cerebral infarction due to embolism of left carotid artery 03/04/2015  . Aneurysm, cerebral, nonruptured 03/04/2015  . History of ruptured arterial aneurysm 03/04/2015  . Essential hypertension 03/04/2015  . HLD (hyperlipidemia) 03/04/2015  . Alterations of sensations following CVA (cerebrovascular accident)   . Embolic cerebral infarction 01/22/2015  . Left hemiparesis 01/22/2015  . Stroke   . Cerebral embolism with cerebral infarction 01/21/2015  . Ataxia    . Brain aneurysm 01/20/2015  . TOBACCO ABUSE 01/04/2010  . BRONCHITIS, CHRONIC  01/04/2010  . CERVICAL CANCER 01/03/2010  . HYPERLIPIDEMIA 01/03/2010  . DEPRESSION 01/03/2010  . SUBARACHNOID HEMORRHAGE 01/03/2010  . [name] 01/03/2010    Guadelupe Sabin, OTR/L  [phone]  06/04/2015, 3:07 PM  [facility] [address] [place], [place], [address] Phone: [phone]   Fax:  [phone]

## 2015-06-04 NOTE — Therapy (Signed)
[employer] [address] [place], [place], [address] Phone: [phone]   Fax:  [phone]  Physical Therapy Treatment  Patient Details  Name: Diane Hutchinson MRN: [id number] Date of Birth: [birth year] Referring Provider:  Charlett Blake, MD  Encounter Date: 06/04/2015      PT End of Session - 06/04/15 1324    Visit Number 11   Number of Visits 18   Date for PT Re-Evaluation 07/03/15   Authorization Type Medicare   Authorization - Visit Number 11   Authorization - Number of Visits 20   PT Start Time 1300   PT Stop Time 1497   PT Time Calculation (min) 43 min   Equipment Utilized During Treatment Gait belt   Activity Tolerance Patient tolerated treatment well   Behavior During Therapy [facility] for tasks assessed/performed      Past Medical History  Diagnosis Date  . Migraine   . Depression   . Hypertension   . Stroke 2016  . COPD (chronic obstructive pulmonary disease)   . Anxiety     h/o of panic attack  . GERD (gastroesophageal reflux disease)     occas. use of TUMS  . Arthritis     knees    Past Surgical History  Procedure Laterality Date  . [place] surgery  2010  . Cholecystectomy      age 40  . Appendectomy      age 22  . Eye surgery  years ago    laser to both eyes for blocked tear ducts   . Brain surgery  1993    aneurysm  . Tubal ligation  years ago  . Hemorroidectomy  years ago  . Radiology with anesthesia N/A 01/20/2015    Procedure: RADIOLOGY WITH ANESTHESIA;  Surgeon: Luanne Bras, MD;  Location: [place];  Service: Radiology;  Laterality: N/A;    There were no vitals filed for this visit.  Visit Diagnosis:  Hemiparesis, left  Lack of coordination due to stroke  Left-sided weakness  Difficulty walking  Abnormality of gait  Impaired functional mobility and activity tolerance      Subjective Assessment - 06/04/15 1320    Subjective Pt reports walking 4-5 times arouind the house,  continues to have difficulty keeping Lt UE on walker   Currently in Pain? No/denies               [facility] Adult PT Treatment/Exercise - 06/04/15 0001    Transfers   Sit to Stand 4: Min guard;5: Engineer, water Details (indicate cue type and reason) Verbal cueing for handplacement for safety   Ambulation/Gait   Ambulation/Gait Yes   Ambulation/Gait Assistance 4: Min assist   Ambulation Distance (Feet) 115 Feet   Assistive device Rolling walker   Gait Pattern Decreased step length - right;Decreased stance time - left;Decreased weight shift to left   Lumbar Exercises: Quadruped   Other Quadruped Lumbar Exercises quadruped crawling forward back and R/L 1; tall kneeling x 2   Knee/Hip Exercises: Standing   Forward Lunges Both;1 set;10 reps   Forward Lunges Limitations Mod(A) to safely perform with L LE in stance    Other Standing Knee Exercises weight shifting side to side with cues for posture in parallel bars, focus on weight bearing through L UE and L LE    Knee/Hip Exercises: Supine   Bridges Both;2 sets;10 reps   Straight Leg Raises 10 reps   Straight Leg Raises Limitations min assist from  PT for L LE    Other Supine Knee/Hip Exercises clams 2x 10 BLE                  PT Short Term Goals - 06/02/15 1506    PT SHORT TERM GOAL #1   Title Patient will complete functional transfers with min assist in order to decrease burden on caregivers and increase independence with functional mobility.    Baseline Mod assist   Time 3   Period Weeks   Status Achieved  Pt requires supervision-CGA to complete transfers   PT SHORT TERM GOAL #2   Title Pt will complete bed mobility with min assist to allow for increased independence.   Baseline Mod assist   Time 3   Period Weeks   Status Achieved  Pt requires supervision-min A to complete bed mobility depending on level of fatigue   PT SHORT TERM GOAL #3   Title Pt will ambulate 15 feet with RW with min assist  in order to improve functional mobility and to allow for pt to ambulate in her home.    Time 3   Period Weeks   Status Achieved   PT SHORT TERM GOAL #4   Title Pt will complete five time sit to stand test in 27 seconds to demonstrate improve BLE strength and functional mobility.    Time 2   Period Weeks   Status New           PT Long Term Goals - 06/02/15 1508    PT LONG TERM GOAL #1   Title Pt will complete functional transfers and bed mobility with supervision to allow for greater independence with self care.    Time 6   Period Weeks   Status Partially Met  Pt requires CGA-min A at times depending on level of fatigue   PT LONG TERM GOAL #2   Title Pt will ambulate 150 feet with RW with supervision to allow for independence with functional mobility and gait.    Time 6   Period Weeks   Status On-going   PT LONG TERM GOAL #3   Title Pt will demonstrate good static standing balance for 10 minutes to demonstrate improved functional activity tolerance and to allow for pt to complete self care.   Time 6   Period Weeks   Status On-going   PT LONG TERM GOAL #4   Title Pt will complete five time sit to stand in 23 seconds to demonstrate improved BLE strength and functional mobliity.    Time 6   Period Weeks   Status New               Plan - 06/04/15 1346    Clinical Impression Statement Session focus on improving weight bearing with Lt UE and LE with quadruped and standing activtiies for functional strengthening.  Began tall kneeling with min assistance for core strenghtening to improve balance.  Pt continues to require cueing for safety with breaks on WC prior standing and proper handplacement.  Pt with improved Lt LE foot placement with gait with less ER upon heel placement but does continue to have difficulty keeping Lt UE on walker.     PT Next Visit Plan Continue with quadruped exercises with caution for position of L UE.  Continue gait training, balance training,  functional strength, transfer training. Weight bearing through L LE and UE as tolerated.         Problem List Patient Active Problem List  Diagnosis Date Noted  . Cerebral infarction due to embolism of left carotid artery 03/04/2015  . Aneurysm, cerebral, nonruptured 03/04/2015  . History of ruptured arterial aneurysm 03/04/2015  . Essential hypertension 03/04/2015  . HLD (hyperlipidemia) 03/04/2015  . Alterations of sensations following CVA (cerebrovascular accident)   . Embolic cerebral infarction 01/22/2015  . Left hemiparesis 01/22/2015  . Stroke   . Cerebral embolism with cerebral infarction 01/21/2015  . Ataxia   . Brain aneurysm 01/20/2015  . TOBACCO ABUSE 01/04/2010  . BRONCHITIS, CHRONIC 01/04/2010  . CERVICAL CANCER 01/03/2010  . HYPERLIPIDEMIA 01/03/2010  . DEPRESSION 01/03/2010  . SUBARACHNOID HEMORRHAGE 01/03/2010  . [name] 01/03/2010   Ihor Austin, LPTA; [place]  Aldona Lento 06/04/2015, 2:04 PM  [facility] [address] [place], [place], [address] Phone: [phone]   Fax:  [phone]

## 2015-06-07 ENCOUNTER — Ambulatory Visit (HOSPITAL_COMMUNITY): Payer: Medicare Other | Admitting: Physical Therapy

## 2015-06-07 DIAGNOSIS — Z7409 Other reduced mobility: Secondary | ICD-10-CM | POA: Diagnosis not present

## 2015-06-07 DIAGNOSIS — R269 Unspecified abnormalities of gait and mobility: Secondary | ICD-10-CM | POA: Diagnosis not present

## 2015-06-07 DIAGNOSIS — R262 Difficulty in walking, not elsewhere classified: Secondary | ICD-10-CM

## 2015-06-07 DIAGNOSIS — M6289 Other specified disorders of muscle: Secondary | ICD-10-CM | POA: Diagnosis not present

## 2015-06-07 DIAGNOSIS — R531 Weakness: Secondary | ICD-10-CM

## 2015-06-07 DIAGNOSIS — I6980 Unspecified sequelae of other cerebrovascular disease: Secondary | ICD-10-CM | POA: Diagnosis not present

## 2015-06-07 DIAGNOSIS — G8190 Hemiplegia, unspecified affecting unspecified side: Secondary | ICD-10-CM | POA: Diagnosis not present

## 2015-06-07 NOTE — Therapy (Signed)
[facility] [address] [place], [place], [address] Phone: [phone]   Fax:  [phone]  Physical Therapy Treatment  Patient Details  Name: Diane Hutchinson MRN: [id number] Date of Birth: 1946-05-12 Referring Provider:  Charlett Blake, MD  Encounter Date: 06/07/2015      PT End of Session - 06/07/15 1633    Visit Number 12   Number of Visits 18   Date for PT Re-Evaluation 07/03/15   Authorization Type Medicare   Authorization - Visit Number 12   Authorization - Number of Visits 20   PT Start Time 8466   PT Stop Time 1430   PT Time Calculation (min) 44 min   Equipment Utilized During Treatment Gait belt   Activity Tolerance Patient tolerated treatment well   Behavior During Therapy [facility] for tasks assessed/performed      Past Medical History  Diagnosis Date  . Migraine   . Depression   . Hypertension   . Stroke 2016  . COPD (chronic obstructive pulmonary disease)   . Anxiety     h/o of panic attack  . GERD (gastroesophageal reflux disease)     occas. use of TUMS  . Arthritis     knees    Past Surgical History  Procedure Laterality Date  . [place] surgery  2010  . Cholecystectomy      age 41  . Appendectomy      age 9  . Eye surgery  years ago    laser to both eyes for blocked tear ducts   . Brain surgery  1993    aneurysm  . Tubal ligation  years ago  . Hemorroidectomy  years ago  . Radiology with anesthesia N/A 01/20/2015    Procedure: RADIOLOGY WITH ANESTHESIA;  Surgeon: Luanne Bras, MD;  Location: [facility];  Service: Radiology;  Laterality: N/A;    There were no vitals filed for this visit.  Visit Diagnosis:  Difficulty walking  Left-sided weakness  Abnormality of gait  Impaired functional mobility and activity tolerance      Subjective Assessment - 06/07/15 1357    Subjective Pt reports that her knees hurt today from crawling on the mat the other day. She states that they were hurting  all weekend.    Currently in Pain? No/denies   Pain Score 5    Pain Location Knee   Pain Orientation Right;Left                         [facility] Adult PT Treatment/Exercise - 06/07/15 0001    Ambulation/Gait   Ambulation/Gait Yes   Ambulation/Gait Assistance 4: Min assist   Ambulation Distance (Feet) 115 Feet   Assistive device Rolling walker   Gait Pattern Decreased step length - right;Decreased stance time - left;Decreased weight shift to left   Knee/Hip Exercises: Standing   Forward Lunges Both;1 set;5 reps   Forward Lunges Limitations Mod(A) to safely perform with L LE in stance    Forward Step Up Left;10 reps   Other Standing Knee Exercises sidestepping in // bars x 3 RT   Other Standing Knee Exercises mini-squat 1x10 with L knee block in parallel bars; sit to stand in staggered stance with Min-Mod(A) with L LE staggered back   Knee/Hip Exercises: Supine   Bridges 20 reps   Bridges Limitations with isometric adduction against ball   Straight Leg Raises 10 reps   Straight Leg Raises Limitations min assist from PT  for L LE    Knee/Hip Exercises: Sidelying   Clams x10 bilaterally                  PT Short Term Goals - 06/02/15 1506    PT SHORT TERM GOAL #1   Title Patient will complete functional transfers with min assist in order to decrease burden on caregivers and increase independence with functional mobility.    Baseline Mod assist   Time 3   Period Weeks   Status Achieved  Pt requires supervision-CGA to complete transfers   PT SHORT TERM GOAL #2   Title Pt will complete bed mobility with min assist to allow for increased independence.   Baseline Mod assist   Time 3   Period Weeks   Status Achieved  Pt requires supervision-min A to complete bed mobility depending on level of fatigue   PT SHORT TERM GOAL #3   Title Pt will ambulate 15 feet with RW with min assist in order to improve functional mobility and to allow for pt to ambulate in her  home.    Time 3   Period Weeks   Status Achieved   PT SHORT TERM GOAL #4   Title Pt will complete five time sit to stand test in 27 seconds to demonstrate improve BLE strength and functional mobility.    Time 2   Period Weeks   Status New           PT Long Term Goals - 06/02/15 1508    PT LONG TERM GOAL #1   Title Pt will complete functional transfers and bed mobility with supervision to allow for greater independence with self care.    Time 6   Period Weeks   Status Partially Met  Pt requires CGA-min A at times depending on level of fatigue   PT LONG TERM GOAL #2   Title Pt will ambulate 150 feet with RW with supervision to allow for independence with functional mobility and gait.    Time 6   Period Weeks   Status On-going   PT LONG TERM GOAL #3   Title Pt will demonstrate good static standing balance for 10 minutes to demonstrate improved functional activity tolerance and to allow for pt to complete self care.   Time 6   Period Weeks   Status On-going   PT LONG TERM GOAL #4   Title Pt will complete five time sit to stand in 23 seconds to demonstrate improved BLE strength and functional mobliity.    Time 6   Period Weeks   Status New               Plan - 06/07/15 1634    Clinical Impression Statement Treatment session focused on improving quad and abductor strength in order to improve gait mechanics and functional mobility. Pt continues to demonstrate impaired control of L knee, requiring assistance from PT to prevent valgus with mini squats, step ups, and lunges.    PT Next Visit Plan Continue with quadruped exercises if tolerated. Continue with weight bearing strengthening of LLE.         Problem List Patient Active Problem List   Diagnosis Date Noted  . Cerebral infarction due to embolism of left carotid artery 03/04/2015  . Aneurysm, cerebral, nonruptured 03/04/2015  . History of ruptured arterial aneurysm 03/04/2015  . Essential hypertension  03/04/2015  . HLD (hyperlipidemia) 03/04/2015  . Alterations of sensations following CVA (cerebrovascular accident)   . Embolic cerebral infarction  01/22/2015  . Left hemiparesis 01/22/2015  . Stroke   . Cerebral embolism with cerebral infarction 01/21/2015  . Ataxia   . Brain aneurysm 01/20/2015  . TOBACCO ABUSE 01/04/2010  . BRONCHITIS, CHRONIC 01/04/2010  . CERVICAL CANCER 01/03/2010  . HYPERLIPIDEMIA 01/03/2010  . DEPRESSION 01/03/2010  . SUBARACHNOID HEMORRHAGE 01/03/2010  . [name] 01/03/2010    Hilma Favors, PT, DPT [phone] 06/07/2015, 4:40 PM  [facility] [address] [place], [place], [address] Phone: [phone]   Fax:  [phone]

## 2015-06-08 ENCOUNTER — Ambulatory Visit (INDEPENDENT_AMBULATORY_CARE_PROVIDER_SITE_OTHER): Payer: Medicare Other | Admitting: Neurology

## 2015-06-08 ENCOUNTER — Encounter: Payer: Self-pay | Admitting: Neurology

## 2015-06-08 VITALS — BP 113/61 | HR 58 | Ht 70.0 in | Wt 167.0 lb

## 2015-06-08 DIAGNOSIS — E785 Hyperlipidemia, unspecified: Secondary | ICD-10-CM | POA: Diagnosis not present

## 2015-06-08 DIAGNOSIS — Z8679 Personal history of other diseases of the circulatory system: Secondary | ICD-10-CM | POA: Diagnosis not present

## 2015-06-08 DIAGNOSIS — I639 Cerebral infarction, unspecified: Secondary | ICD-10-CM

## 2015-06-08 DIAGNOSIS — I671 Cerebral aneurysm, nonruptured: Secondary | ICD-10-CM | POA: Diagnosis not present

## 2015-06-08 DIAGNOSIS — I63132 Cerebral infarction due to embolism of left carotid artery: Secondary | ICD-10-CM | POA: Diagnosis not present

## 2015-06-08 DIAGNOSIS — I10 Essential (primary) hypertension: Secondary | ICD-10-CM | POA: Diagnosis not present

## 2015-06-08 NOTE — Patient Instructions (Signed)
-   continue ASA 325 mg and plavix 37.5mg  for stroke prevention and for stent management - continue to follow up with Dr. Estanislado Pandy - continue lipitor for stroke prevention - check BP at home - Follow up with your primary care physician for stroke risk factor modification. Recommend maintain blood pressure goal <130/80, diabetes with hemoglobin A1c goal below 6.5% and lipids with LDL cholesterol goal below 70 mg/dL.  - continue to abstain from smoking - continue outp PT/OT - follow up in 6 months.

## 2015-06-08 NOTE — Progress Notes (Signed)
STROKE NEUROLOGY FOLLOW UP NOTE  NAME: Diane Hutchinson DOB: 1946-06-28  REASON FOR VISIT: stroke follow up HISTORY FROM: husband and chart  Today we had the pleasure of seeing Diane Hutchinson in follow-up at our Neurology Clinic. Pt was accompanied by husband.   History Summary Diane Hutchinson is a 69 y.o. 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 Jaynee Eagles on 11/24/14 for clearance for right breast elective surgery. As per chart, she was admitted to [facility] on 11/08/2009 with severe headache, nausea and vomiting and CT scan showed SAH. Cerebral angiogram revealed a right PICA aneurysm, left MCA aneurysm and right MCA aneurysm s/p clipping in 1993. The right PICA aneurysm was coiled by Dr. Patrecia Pour. However, the left MCA aneurysm still left without treated. Due to higher risk of rupture during general anesthesia, Dr. Jaynee Eagles performed CTA head and referred pt to Dr. Estanislado Pandy for management of left MCA aneurysm.  On 01/22/15, pt had first step of stent assisted left widenecked MCA aneurysm procedure. After the procedure, pt developed left UE and LE numbness, tingling, weakness and ataxia. Pt not a tPA candidate secondary to being on IV heparin drip and elevated APTT. She can not have MRI. Repeat CT confirmed right thalamus/posterior IC, considering related to procedure. She was continued on ASA 325 and plavix 75 as well as lipitor 40. She was also recommended to quit smoking. She was sent to CIR after discharge.  She has hx of HLD and on lipitor and lopid since 1993. Hx of HTN and taking metoprolol.  Follow up 03/04/15 - the patient has been doing relatively stable. She finished CIR and discharged home with home therapy 3 times a week. She has quit smoking since March admission. She still has left sided weakness but  slow improving. She will have the 2nd stage of left MCA aneurysm treatment in about 2 months with Dr. Estanislado Pandy. After that, she will decide on the elective right breast procedure (not cancer as per pt). Her BP 149/71 but she said at home the therapist checked on her, it was always at 120s. Her platelet function assay was quite low on last check, therefore her plavix was decreased to half tablets.  Interval History During the interval time, the patient has been doing well. Left-sided weakness much more improved than last visit. She came in with wheelchair, however at home she was able to work with therapist to walk with walker. She has been following up with Dr. Letta Pate for rehabilitation. Blood pressure much improved, today in clinic 113/61. She still on aspirin 325 mg and half tablet of Plavix.  REVIEW OF SYSTEMS: Full 14 system review of systems performed and notable only for those listed below and in HPI above, all others are negative:  Constitutional:   Cardiovascular:  Ear/Nose/Throat: Runny nose  Skin:  Eyes:   Respiratory:   Gastroitestinal:   Genitourinary:  Hematology/Lymphatic:   Endocrine: feeling cold Musculoskeletal:  Joint swelling, walking difficulties Allergy/Immunology:   Neurological:   Psychiatric:  Sleep:   The following represents the patient's updated allergies and side effects list: Allergies  Allergen Reactions  . Codeine Other (See Comments)    dellusions  . Meperidine Hcl Other (See Comments)    Hurts stomach  . Morphine Other (See Comments)    dellusions  . Sulfonamide Derivatives Other (See  Comments)    Drives her nuts    The neurologically relevant items on the patient's problem list were reviewed on today's visit.  Neurologic Examination  A problem focused neurological exam (12 or more points of the single system neurologic examination, vital signs counts as 1 point, cranial nerves count for 8 points) was performed.  Blood pressure 113/61,  pulse 58, height 5\' 10"  (1.778 m), weight 167 lb (75.751 kg).  General - Well nourished, well developed, in no apparent distress.  Ophthalmologic - fundi not visualized due to eye movement.  Cardiovascular - Regular rate and rhythm with no murmur.  Mental Status -  Level of arousal and orientation to time, place, and person were intact. Language including expression, naming, repetition, comprehension was assessed and found intact. Fund of Knowledge was assessed and was intact.  Cranial Nerves II - XII - II - Visual field intact OU. III, IV, VI - Extraocular movements intact. V - Facial sensation symmetrical bilaterally VII - Facial movement intact bilaterally. VIII - Hearing & vestibular intact bilaterally. X - Palate elevates symmetrically. XI - Chin turning & shoulder shrug intact bilaterally. XII - Tongue protrusion intact.  Motor Strength - The patient's strength was 5-/5 LUE proximal and distal with left hand mild dexterity difficulties, 5-/5 LLE proximal and distal and pronator drift was absent.  Bulk was normal and fasciculations were absent.   Motor Tone - Muscle tone was assessed at the neck and appendages and was normal.  Reflexes - The patient's reflexes were normal in all extremities and she had no pathological reflexes.  Sensory - Light touch, temperature/pinprick were assessed and were symmetrical bilaterally.    Coordination - The patient had mild ataxia on the left hand but not out of proportion to the weakness.  Tremor was absent.  Gait and Station - in wheelchair, not able to test due to safety concerns.  Data reviewed: I personally reviewed the images and agree with the radiology interpretations.  CTA head 12/03/14 - 1. Prior right MCA bifurcation aneurysm clipping and prior right PICA aneurysm coiling without evidence of residual/recurrent aneurysm. 2. Unchanged 4.5 mm left MCA bifurcation aneurysm. 3. No acute intracranial abnormality.  CTA head and  neck 01/20/15 -  Pipeline stent placed in the left MCA spanning a left MCA aneurysm. No complications seen relative to that. No missing vessels. No hemorrhage. One could question mild spasm of the MCA branch just beyond the end of the stent, but the vessel is widely patent beyond that and appears as it did before. Previously clipped right MCA region aneurysm. No missing vessels demonstrated on the right compared to the study of 12/03/2014. 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  CT head 01/22/15 -  Changes consistent with prior aneurysm coiling, clipping and stenting as described. New rounded area of decreased attenuation in the right thalamus laterally consistent with an evolving area of ischemia.  2D echo - Normal LV size and systolic function, EF 49-70%. Normal RV size and systolic function. No significant valvular abnormalities. Mild LAE.  Component     Latest Ref Rng 01/21/2015 01/22/2015  Cholesterol     0 - 200 mg/dL  116  Triglycerides     <150 mg/dL  146  HDL Cholesterol     >39 mg/dL  34 (L)  Total CHOL/HDL Ratio       3.4  VLDL     0 - 40 mg/dL  29  LDL (calc)     0 -  99 mg/dL  53  Hemoglobin A1C     4.8 - 5.6 % 6.0 (H)   Mean Plasma Glucose      126     Assessment: As you may recall, she is a 69 y.o. Caucasian female with PMH of HTN, HLD, right MCA aneurysm s/p clippering in 1993, [place] with right PICA ruptured aneurysm s/p coiling on 11/08/2009 was admitted for left MCA aneurysm management with pipe-line stent on 01/22/15, found to have right thalamic/posterior IC stroke post procedure. Pt stroke risk factor including smoker, hx of HTN and HLD, but LDL controlled well on meds. Her stroke still most likely due to procedure, especially posterior IC stroke may due to embolic source from AchA coming out directly from ICA. She was continued on ASA 325mg  and with half tab plavix (due to low platelet functional assay) as well as  lipitor. Follow up with Dr. Estanislado Pandy for further management of left MCA aneurysm. Over time, her left-sided weakness and numbness much improved. She has quit smoking.  Plan:   - continue ASA 325 mg and plavix 37.5mg  for stroke prevention and for stent management - need to follow up with Dr. Estanislado Pandy soon. - continue lipitor for stroke prevention - check BP at home - Follow up with your primary care physician for stroke risk factor modification. Recommend maintain blood pressure goal <130/80, diabetes with hemoglobin A1c goal below 6.5% and lipids with LDL cholesterol goal below 70 mg/dL.  - continue to abstain from smoking - continue outp PT/OT - RTC in 6 months.   No orders of the defined types were placed in this encounter.    Meds ordered this encounter  Medications  . benazepril (LOTENSIN) 20 MG tablet    Sig: Take 20 mg by mouth daily.    Refill:  1  . calcitonin, salmon, (MIACALCIN/FORTICAL) 200 UNIT/ACT nasal spray    Sig: USE ONE SPRAY IN ONE NOSTRIL DAILY. ALTERNATE NOSTRILS WITH EACH SPRAY.    Refill:  0  . calcium-vitamin D (OSCAL WITH D) 500-200 MG-UNIT TABS    Sig: Take 1 tablet by mouth 2 (two) times daily.    Refill:  1  . gabapentin (NEURONTIN) 300 MG capsule    Sig: TAKE ONE CAPSULE BY MOUTH THREE TIMES DAILY.    Refill:  1  . chlorthalidone (HYGROTON) 25 MG tablet    Sig: Take 25 mg by mouth daily.    Refill:  1  . SENNA LAX 8.6 MG tablet    Sig: Take 1 tablet by mouth 2 (two) times daily.    Refill:  0    Patient Instructions  - continue ASA 325 mg and plavix 1/2 tab (37.5mg ) daily for stroke prevention and for stent management - continue to follow up with Dr. Estanislado Pandy for the 2 stage of left MCA aneurysm - continue lipitor for stroke prevention - check BP at home - Follow up with your primary care physician for stroke risk factor modification. Recommend maintain blood pressure goal <130/80, diabetes with hemoglobin A1c goal below 6.5% and lipids with  LDL cholesterol goal below 70 mg/dL.  - continue to abstain from smoking - aggressive with PT/OT and do your own exercise at home - follow up in 3 months.  Rosalin Hawking, MD PhD [facility] [address] [place], [place] [address] [phone]

## 2015-06-09 ENCOUNTER — Encounter (HOSPITAL_COMMUNITY): Payer: Self-pay | Admitting: Occupational Therapy

## 2015-06-09 ENCOUNTER — Ambulatory Visit (HOSPITAL_COMMUNITY): Payer: Medicare Other | Admitting: Occupational Therapy

## 2015-06-09 ENCOUNTER — Ambulatory Visit (HOSPITAL_COMMUNITY): Payer: Medicare Other | Admitting: Physical Therapy

## 2015-06-09 DIAGNOSIS — I6980 Unspecified sequelae of other cerebrovascular disease: Secondary | ICD-10-CM | POA: Diagnosis not present

## 2015-06-09 DIAGNOSIS — R262 Difficulty in walking, not elsewhere classified: Secondary | ICD-10-CM

## 2015-06-09 DIAGNOSIS — Z7409 Other reduced mobility: Secondary | ICD-10-CM

## 2015-06-09 DIAGNOSIS — R531 Weakness: Secondary | ICD-10-CM

## 2015-06-09 DIAGNOSIS — G8190 Hemiplegia, unspecified affecting unspecified side: Secondary | ICD-10-CM | POA: Diagnosis not present

## 2015-06-09 DIAGNOSIS — IMO0002 Reserved for concepts with insufficient information to code with codable children: Secondary | ICD-10-CM

## 2015-06-09 DIAGNOSIS — M6289 Other specified disorders of muscle: Secondary | ICD-10-CM | POA: Diagnosis not present

## 2015-06-09 DIAGNOSIS — R29898 Other symptoms and signs involving the musculoskeletal system: Secondary | ICD-10-CM

## 2015-06-09 DIAGNOSIS — R269 Unspecified abnormalities of gait and mobility: Secondary | ICD-10-CM | POA: Diagnosis not present

## 2015-06-09 NOTE — Therapy (Signed)
[facility] [place], [place], [address] Phone: [phone]   Fax:  [phone]  Physical Therapy Treatment  Patient Details  Name: Diane Hutchinson MRN: [id number] Date of Birth: 1946/04/19 Referring Provider:  Charlett Blake, MD  Encounter Date: 06/09/2015      PT End of Session - 06/09/15 1431    Visit Number 13   Number of Visits 18   Date for PT Re-Evaluation 07/03/15   Authorization Type Medicare   Authorization - Visit Number 13   Authorization - Number of Visits 20   PT Start Time 1300   PT Stop Time 1345   PT Time Calculation (min) 45 min   Equipment Utilized During Treatment Gait belt   Activity Tolerance Patient tolerated treatment well   Behavior During Therapy [facility] for tasks assessed/performed      Past Medical History  Diagnosis Date  . Migraine   . Depression   . Hypertension   . Stroke 2016  . COPD (chronic obstructive pulmonary disease)   . Anxiety     h/o of panic attack  . GERD (gastroesophageal reflux disease)     occas. use of TUMS  . Arthritis     knees    Past Surgical History  Procedure Laterality Date  . [place] surgery  2010  . Cholecystectomy      age 7  . Appendectomy      age 88  . Eye surgery  years ago    laser to both eyes for blocked tear ducts   . Brain surgery  1993    aneurysm  . Tubal ligation  years ago  . Hemorroidectomy  years ago  . Radiology with anesthesia N/A 01/20/2015    Procedure: RADIOLOGY WITH ANESTHESIA;  Surgeon: Luanne Bras, MD;  Location: [facility];  Service: Radiology;  Laterality: N/A;    There were no vitals filed for this visit.  Visit Diagnosis:  Left-sided weakness  Difficulty walking  Abnormality of gait  Impaired functional mobility and activity tolerance      Subjective Assessment - 06/09/15 1303    Subjective Pt's husband reports that pt was able to walk up the ramp at her MDs office without AD, holding onto the  railing.             [facility] Adult PT Treatment/Exercise - 06/09/15 0001    Ambulation/Gait   Ambulation/Gait Yes   Ambulation/Gait Assistance 4: Min assist   Ambulation Distance (Feet) 226 Feet  with one rest break   Assistive device Hemi-walker   Gait Pattern Decreased step length - right;Decreased stance time - left;Decreased weight shift to left   Knee/Hip Exercises: Standing   Forward Lunges Both;1 set;10 reps   Forward Lunges Limitations Mod(A) to safely perform with L LE in stance    Rocker Board Limitations attempted, unable to tolerate   Other Standing Knee Exercises sidestepping in // bars x 3 RT   Knee/Hip Exercises: Seated   Sit to Sand 10 reps  with R foot on 2" box                  PT Short Term Goals - 06/02/15 1506    PT SHORT TERM GOAL #1   Title Patient will complete functional transfers with min assist in order to decrease burden on caregivers and increase independence with functional mobility.    Baseline Mod assist   Time 3   Period Weeks   Status Achieved  Pt requires supervision-CGA to complete transfers   PT SHORT TERM GOAL #2   Title Pt will complete bed mobility with min assist to allow for increased independence.   Baseline Mod assist   Time 3   Period Weeks   Status Achieved  Pt requires supervision-min A to complete bed mobility depending on level of fatigue   PT SHORT TERM GOAL #3   Title Pt will ambulate 15 feet with RW with min assist in order to improve functional mobility and to allow for pt to ambulate in her home.    Time 3   Period Weeks   Status Achieved   PT SHORT TERM GOAL #4   Title Pt will complete five time sit to stand test in 27 seconds to demonstrate improve BLE strength and functional mobility.    Time 2   Period Weeks   Status New           PT Long Term Goals - 06/02/15 1508    PT LONG TERM GOAL #1   Title Pt will complete functional transfers and bed mobility with supervision to allow for greater  independence with self care.    Time 6   Period Weeks   Status Partially Met  Pt requires CGA-min A at times depending on level of fatigue   PT LONG TERM GOAL #2   Title Pt will ambulate 150 feet with RW with supervision to allow for independence with functional mobility and gait.    Time 6   Period Weeks   Status On-going   PT LONG TERM GOAL #3   Title Pt will demonstrate good static standing balance for 10 minutes to demonstrate improved functional activity tolerance and to allow for pt to complete self care.   Time 6   Period Weeks   Status On-going   PT LONG TERM GOAL #4   Title Pt will complete five time sit to stand in 23 seconds to demonstrate improved BLE strength and functional mobliity.    Time 6   Period Weeks   Status New               Plan - 06/09/15 1431    Clinical Impression Statement Pt required increased verbal cueing for gait training with hemiwalker for proper sequencing, safety, and foot clearance. Rockerboard was attempted to improve weight shifting, however, pt experienced increased knee pain and was not able to extend knees or properly weight shift while standing on rockerboard.  She will benefit from continued gait training with hemiwalker and progressed quad strengthening to improve control of L knee.    PT Next Visit Plan Continue with functional strengthening of quads and abductors        Problem List Patient Active Problem List   Diagnosis Date Noted  . Cerebral infarction due to embolism of left carotid artery 03/04/2015  . Aneurysm, cerebral, nonruptured 03/04/2015  . History of ruptured arterial aneurysm 03/04/2015  . Essential hypertension 03/04/2015  . HLD (hyperlipidemia) 03/04/2015  . Alterations of sensations following CVA (cerebrovascular accident)   . Embolic cerebral infarction 01/22/2015  . Left hemiparesis 01/22/2015  . Stroke   . Cerebral embolism with cerebral infarction 01/21/2015  . Ataxia   . Brain aneurysm 01/20/2015   . TOBACCO ABUSE 01/04/2010  . BRONCHITIS, CHRONIC 01/04/2010  . CERVICAL CANCER 01/03/2010  . HYPERLIPIDEMIA 01/03/2010  . DEPRESSION 01/03/2010  . SUBARACHNOID HEMORRHAGE 01/03/2010  . C O P D 01/03/2010    Hilma Favors, PT, DPT [phone] 06/09/2015,  2:45 PM  [place], [place], [address] Phone: [phone]   Fax:  [phone]

## 2015-06-09 NOTE — Therapy (Signed)
[place], [place], [address] Phone: [phone]   Fax:  [phone]  Occupational Therapy Treatment  Patient Details  Name: Diane Hutchinson MRN: [id number] Date of Birth: 1946-08-08 Referring Provider:  Charlett Blake, MD  Encounter Date: 06/09/2015      OT End of Session - 06/09/15 1643    Visit Number 3   Number of Visits 18   Date for OT Re-Evaluation 08/01/15  mini reassessment 06/30/2015   Authorization Type Medicare/Medicare A & B   Authorization Time Period Before 10th visit   Authorization - Visit Number 3   Authorization - Number of Visits 10   OT Start Time 1346   OT Stop Time 1432   OT Time Calculation (min) 46 min   Activity Tolerance Patient tolerated treatment well   Behavior During Therapy [facility] for tasks assessed/performed      Past Medical History  Diagnosis Date  . Migraine   . Depression   . Hypertension   . Stroke 2016  . COPD (chronic obstructive pulmonary disease)   . Anxiety     h/o of panic attack  . GERD (gastroesophageal reflux disease)     occas. use of TUMS  . Arthritis     knees    Past Surgical History  Procedure Laterality Date  . [place] surgery  2010  . Cholecystectomy      age 56  . Appendectomy      age 45  . Eye surgery  years ago    laser to both eyes for blocked tear ducts   . Brain surgery  1993    aneurysm  . Tubal ligation  years ago  . Hemorroidectomy  years ago  . Radiology with anesthesia N/A 01/20/2015    Procedure: RADIOLOGY WITH ANESTHESIA;  Surgeon: Luanne Bras, MD;  Location: [facility];  Service: Radiology;  Laterality: N/A;    There were no vitals filed for this visit.  Visit Diagnosis:  Left-sided weakness  Lack of coordination due to stroke  Decreased grip strength of left hand  Decreased pinch strength      Subjective Assessment - 06/09/15 1638    Subjective  S: I held on to my railing to walk up the ramp the other  day.    Currently in Pain? No/denies            [facility] OT Assessment - 06/09/15 1637    Assessment   Diagnosis CVA-left sided weakness   Precautions   Precautions Fall                  OT Treatments/Exercises (OP) - 06/09/15 1638    Exercises   Exercises Shoulder;Wrist;Hand;Elbow   Shoulder Exercises: Supine   Protraction Strengthening;12 reps   Protraction Weight (lbs) 1   Horizontal ABduction Strengthening;12 reps   Horizontal ABduction Weight (lbs) 1   External Rotation Strengthening;12 reps   External Rotation Weight (lbs) 1   Internal Rotation Strengthening;12 reps   Internal Rotation Weight (lbs) 1   Flexion Strengthening;12 reps   Shoulder Flexion Weight (lbs) 1   ABduction Strengthening;12 reps   Shoulder ABduction Weight (lbs) 1   Shoulder Exercises: Seated   Other Seated Exercises shoulder press 10X, 1# wrist weight   Elbow Exercises   Bar Weights/Barbell (Elbow Flexion) 1 lb  10X   Elbow Extension Strengthening;10 reps   Bar Weights/Barbell (Elbow Extension) 1 lb   Additional Wrist Exercises   Theraputty - Locate Pegs  Pt used medial side of hand to flatten putty and look for beads. Pt used bilateral hands to locate beads and work to surface, pt had mod difficulty using left hands to remove beads from putty. Pt found & removed 6/6 beads with extended time.    Functional Reaching Activities   Mid Level Pt attempted to place 6 yellow clothespins on vertical rod while seated at table. Pt wore a 1# wrist weight during task to increase proprioceptive input and control ataxic movements. Pt was able to place 3/6 clothespins with mod-max difficulty.                   OT Short Term Goals - 06/04/15 1506    OT SHORT TERM GOAL #1   Title Pt will be educated on and independent in HEP.    Time 4   Period Weeks   Status On-going   OT SHORT TERM GOAL #2   Title Pt will increase LUE strength to 4/5 to increase ability to assist in donning shirts.     Time 4   Period Weeks   Status On-going   OT SHORT TERM GOAL #3   Title Patient will increase grip strength by 5# and pinch strength by 3# to increase ability to hold coffee cup.    Time 4   Period Weeks   Status On-going   OT SHORT TERM GOAL #4   Title Patient will increase fine motor coordination by completing 9 hole peg test within 2 minutes.    Time 4   Period Weeks   Status On-going           OT Long Term Goals - 06/04/15 1506    OT LONG TERM GOAL #1   Title Pt will complete all BADL tasks at Mod I level, including dressing, bathing, and meal preparation.    Time 8   Period Weeks   Status On-going   OT LONG TERM GOAL #2   Title Pt will increase LUE strength to 4+/5 to increase ability to complete daily tasks.    Time 8   Period Weeks   Status On-going   OT LONG TERM GOAL #3   Title Patient will increase grip strength by 10# and pinch strength by 5# to increase ability to grasp walker or surfaces during transfers and functional mobility tasks.    Time 8   Period Weeks   Status On-going   OT LONG TERM GOAL #4   Title Pt will increase fine motor coordination by completing 9 hole peg test in under 1 minute.    Time 8   Period Weeks   Status On-going               Plan - 06/09/15 1643    Clinical Impression Statement A: Continued shoulder and elbow strengthening this session using 1# wrist weight to decrease ataxic movement and increase proprioceptive input. Pt offered intermittant rest breaks due to fatigue. Pt became frustrated with clothespin task when trying to pinch the clothespins.    Plan P: Attempt beads with gripper using 1# wrist weight. Continue [place] activities.         Problem List Patient Active Problem List   Diagnosis Date Noted  . Cerebral infarction due to embolism of left carotid artery 03/04/2015  . Aneurysm, cerebral, nonruptured 03/04/2015  . History of ruptured arterial aneurysm 03/04/2015  . Essential hypertension 03/04/2015  .  HLD (hyperlipidemia) 03/04/2015  . Alterations of sensations following CVA (cerebrovascular accident)   .  Embolic cerebral infarction 01/22/2015  . Left hemiparesis 01/22/2015  . Stroke   . Cerebral embolism with cerebral infarction 01/21/2015  . Ataxia   . Brain aneurysm 01/20/2015  . TOBACCO ABUSE 01/04/2010  . BRONCHITIS, CHRONIC 01/04/2010  . CERVICAL CANCER 01/03/2010  . HYPERLIPIDEMIA 01/03/2010  . DEPRESSION 01/03/2010  . SUBARACHNOID HEMORRHAGE 01/03/2010  . [name] 01/03/2010   Guadelupe Sabin, OTR/L  [phone] 06/09/2015, 4:46 PM  [facility] [address] [place], [place], [address] Phone: [phone]   Fax:  [phone]

## 2015-06-11 ENCOUNTER — Ambulatory Visit (HOSPITAL_COMMUNITY): Payer: Medicare Other | Admitting: Physical Therapy

## 2015-06-11 ENCOUNTER — Ambulatory Visit (HOSPITAL_COMMUNITY): Payer: Medicare Other

## 2015-06-11 ENCOUNTER — Encounter (HOSPITAL_COMMUNITY): Payer: Self-pay

## 2015-06-11 DIAGNOSIS — Z7409 Other reduced mobility: Secondary | ICD-10-CM

## 2015-06-11 DIAGNOSIS — R262 Difficulty in walking, not elsewhere classified: Secondary | ICD-10-CM | POA: Diagnosis not present

## 2015-06-11 DIAGNOSIS — R531 Weakness: Secondary | ICD-10-CM

## 2015-06-11 DIAGNOSIS — I6980 Unspecified sequelae of other cerebrovascular disease: Secondary | ICD-10-CM | POA: Diagnosis not present

## 2015-06-11 DIAGNOSIS — G8190 Hemiplegia, unspecified affecting unspecified side: Secondary | ICD-10-CM | POA: Diagnosis not present

## 2015-06-11 DIAGNOSIS — R269 Unspecified abnormalities of gait and mobility: Secondary | ICD-10-CM | POA: Diagnosis not present

## 2015-06-11 DIAGNOSIS — R29898 Other symptoms and signs involving the musculoskeletal system: Secondary | ICD-10-CM

## 2015-06-11 DIAGNOSIS — M6289 Other specified disorders of muscle: Secondary | ICD-10-CM | POA: Diagnosis not present

## 2015-06-11 DIAGNOSIS — IMO0002 Reserved for concepts with insufficient information to code with codable children: Secondary | ICD-10-CM

## 2015-06-11 NOTE — Therapy (Signed)
[place], [place], [address] Phone: [phone]   Fax:  [phone]  Occupational Therapy Treatment  Patient Details  Name: Diane Hutchinson MRN: [id number] Date of Birth: December 04, 1945 Referring Provider:  Charlett Blake, MD  Encounter Date: 06/11/2015      OT End of Session - 06/11/15 1447    Visit Number 4   Number of Visits 18   Date for OT Re-Evaluation 08/01/15  mini reassessment 06/30/2015   Authorization Type Medicare/Medicare A & B   Authorization Time Period Before 10th visit   Authorization - Visit Number 4   Authorization - Number of Visits 10   OT Start Time 1300   OT Stop Time 1346   OT Time Calculation (min) 46 min   Activity Tolerance Patient tolerated treatment well   Behavior During Therapy [facility] for tasks assessed/performed      Past Medical History  Diagnosis Date  . Migraine   . Depression   . Hypertension   . Stroke 2016  . COPD (chronic obstructive pulmonary disease)   . Anxiety     h/o of panic attack  . GERD (gastroesophageal reflux disease)     occas. use of TUMS  . Arthritis     knees    Past Surgical History  Procedure Laterality Date  . [place] surgery  2010  . Cholecystectomy      age 68  . Appendectomy      age 83  . Eye surgery  years ago    laser to both eyes for blocked tear ducts   . Brain surgery  1993    aneurysm  . Tubal ligation  years ago  . Hemorroidectomy  years ago  . Radiology with anesthesia N/A 01/20/2015    Procedure: RADIOLOGY WITH ANESTHESIA;  Surgeon: Luanne Bras, MD;  Location: [facility];  Service: Radiology;  Laterality: N/A;    There were no vitals filed for this visit.  Visit Diagnosis:  Left-sided weakness  Lack of coordination due to stroke  Decreased grip strength of left hand      Subjective Assessment - 06/11/15 1311    Subjective  S: This arm has a mind of it's own.   Currently in Pain? No/denies                       OT Treatments/Exercises (OP) - 06/11/15 1408    Transfers   Transfers Sit to Stand   Sit to Stand 3: Mod assist;With upper extremity assist;With armrests   Sit to Stand Details (indicate cue type and reason) Pt required assistace to stand and was able able to control transfer once standing. Increased difficutly when transfering towards left side versus right side.    Exercises   Exercises Shoulder;Wrist;Hand;Elbow   Shoulder Exercises: Supine   Protraction AAROM;10 reps   Horizontal ABduction AAROM;10 reps   External Rotation AAROM;10 reps   Internal Rotation AAROM;10 reps   Flexion AAROM;10 reps   ABduction AAROM;10 reps   Other Supine Exercises Focused on controlling movements during AAROM where OTR/L provided support and constant vc's for form and movement.   Shoulder Exercises: Isometric Strengthening   Flexion Supine;3X5"   Extension Supine;3X5"   External Rotation Supine;3X5"   Internal Rotation Supine;3X5"   ABduction Supine;3X5"   ADduction Supine;3X5"   Elbow Exercises   Elbow Extension AROM;10 reps   Additional Elbow Exercises   Hand Gripper with Large Beads 6/6 beads gripper set  at 7#   Hand Gripper with Medium Beads 4/13 beads gripper set at 7#                  OT Short Term Goals - 06/04/15 1506    OT SHORT TERM GOAL #1   Title Pt will be educated on and independent in HEP.    Time 4   Period Weeks   Status On-going   OT SHORT TERM GOAL #2   Title Pt will increase LUE strength to 4/5 to increase ability to assist in donning shirts.    Time 4   Period Weeks   Status On-going   OT SHORT TERM GOAL #3   Title Patient will increase grip strength by 5# and pinch strength by 3# to increase ability to hold coffee cup.    Time 4   Period Weeks   Status On-going   OT SHORT TERM GOAL #4   Title Patient will increase fine motor coordination by completing 9 hole peg test within 2 minutes.    Time 4   Period Weeks   Status  On-going           OT Long Term Goals - 06/04/15 1506    OT LONG TERM GOAL #1   Title Pt will complete all BADL tasks at Mod I level, including dressing, bathing, and meal preparation.    Time 8   Period Weeks   Status On-going   OT LONG TERM GOAL #2   Title Pt will increase LUE strength to 4+/5 to increase ability to complete daily tasks.    Time 8   Period Weeks   Status On-going   OT LONG TERM GOAL #3   Title Patient will increase grip strength by 10# and pinch strength by 5# to increase ability to grasp walker or surfaces during transfers and functional mobility tasks.    Time 8   Period Weeks   Status On-going   OT LONG TERM GOAL #4   Title Pt will increase fine motor coordination by completing 9 hole peg test in under 1 minute.    Time 8   Period Weeks   Status On-going               Plan - 06/11/15 1448    Clinical Impression Statement A: Pt states that today she is not moving very well. She reports that it took her 15 minutes to get into her Lucianne Lei today. We did not use a 1# weight for strengthening exercises. Instead therapist provided active assist while giving verbal commands to control movement. Pt did very well although still presents with severe ataxic movements. Attempted 1# wrist weight during handgripper task to decrease ataxic movements and the weight did not provide any more stability. Therapist instead stabalized top of gripper and verbally instructed patient, "Squeeze hard! Now release,." Pt did well with receiving verbal commands versus none. Broke activity down into smaller steps as patient seemed to become more frustrated with all the beads in front of her. Pt did very well with 2 beads at a time, resting, then 2 more beads. Encouraged patient to practice this technique at home. Instead of trying to complete an entire task; break it down. Do one step, rest, do another step, rest, etc.    Plan P: Break complex movements down into simple movements to  decrease ataxic. Use verbal commands during movements to increase fluidity and control.         Problem List Patient Active  Problem List   Diagnosis Date Noted  . Cerebral infarction due to embolism of left carotid artery 03/04/2015  . Aneurysm, cerebral, nonruptured 03/04/2015  . History of ruptured arterial aneurysm 03/04/2015  . Essential hypertension 03/04/2015  . HLD (hyperlipidemia) 03/04/2015  . Alterations of sensations following CVA (cerebrovascular accident)   . Embolic cerebral infarction 01/22/2015  . Left hemiparesis 01/22/2015  . Stroke   . Cerebral embolism with cerebral infarction 01/21/2015  . Ataxia   . Brain aneurysm 01/20/2015  . TOBACCO ABUSE 01/04/2010  . BRONCHITIS, CHRONIC 01/04/2010  . CERVICAL CANCER 01/03/2010  . HYPERLIPIDEMIA 01/03/2010  . DEPRESSION 01/03/2010  . SUBARACHNOID HEMORRHAGE 01/03/2010  . [name] 01/03/2010    Ailene Ravel, OTR/L,CBIS  [phone]  06/11/2015, 2:56 PM  [facility] [address] [place], [place], [address] Phone: [phone]   Fax:  [phone]

## 2015-06-11 NOTE — Therapy (Signed)
[place], [place], [address] Phone: [phone]   Fax:  [phone]  Physical Therapy Treatment  Patient Details  Name: Diane Hutchinson MRN: [id number] Date of Birth: December 06, 1945 Referring Provider:  Neale Burly, MD  Encounter Date: 06/11/2015      PT End of Session - 06/11/15 1521    Visit Number 14   Number of Visits 18   Date for PT Re-Evaluation 07/03/15   Authorization Type Medicare   Authorization - Visit Number 14   Authorization - Number of Visits 20   PT Start Time 9381   PT Stop Time 1510   PT Time Calculation (min) 45 min   Equipment Utilized During Treatment Gait belt   Activity Tolerance Patient limited by fatigue   Behavior During Therapy [facility] for tasks assessed/performed      Past Medical History  Diagnosis Date  . Migraine   . Depression   . Hypertension   . Stroke 2016  . COPD (chronic obstructive pulmonary disease)   . Anxiety     h/o of panic attack  . GERD (gastroesophageal reflux disease)     occas. use of TUMS  . Arthritis     knees    Past Surgical History  Procedure Laterality Date  . [place] surgery  2010  . Cholecystectomy      age 42  . Appendectomy      age 49  . Eye surgery  years ago    laser to both eyes for blocked tear ducts   . Brain surgery  1993    aneurysm  . Tubal ligation  years ago  . Hemorroidectomy  years ago  . Radiology with anesthesia N/A 01/20/2015    Procedure: RADIOLOGY WITH ANESTHESIA;  Surgeon: Luanne Bras, MD;  Location: [facility];  Service: Radiology;  Laterality: N/A;    There were no vitals filed for this visit.  Visit Diagnosis:  Left-sided weakness  Difficulty walking  Abnormality of gait  Impaired functional mobility and activity tolerance      Subjective Assessment - 06/11/15 1449    Subjective Pt reports that she is having increased difficulty getting in and out of chairs today, and is having difficulty walking.  Her husband reports that he had increased difficulty getting her in and out of the car.                          [place] Adult PT Treatment/Exercise - 06/11/15 1430    Ambulation/Gait   Ambulation/Gait Yes   Ambulation/Gait Assistance 3: Mod assist   Ambulation Distance (Feet) 15 Feet  x2   Assistive device Hemi-walker   Gait Pattern Decreased step length - right;Decreased stance time - left;Decreased weight shift to left   Knee/Hip Exercises: Stretches   Passive Hamstring Stretch 2 reps;30 seconds   Knee/Hip Exercises: Seated   Other Seated Knee/Hip Exercises seated cone rotation x 2   Sit to Sand 10 reps;with UE support  min A from PT   Knee/Hip Exercises: Supine   Hip Adduction Isometric 15 reps   Bridges 2 sets;10 reps  3 second hold   Other Supine Knee/Hip Exercises clams with blue tband x15                  PT Short Term Goals - 06/02/15 1506    PT SHORT TERM GOAL #1   Title Patient will complete functional transfers with min assist  in order to decrease burden on caregivers and increase independence with functional mobility.    Baseline Mod assist   Time 3   Period Weeks   Status Achieved  Pt requires supervision-CGA to complete transfers   PT SHORT TERM GOAL #2   Title Pt will complete bed mobility with min assist to allow for increased independence.   Baseline Mod assist   Time 3   Period Weeks   Status Achieved  Pt requires supervision-min A to complete bed mobility depending on level of fatigue   PT SHORT TERM GOAL #3   Title Pt will ambulate 15 feet with RW with min assist in order to improve functional mobility and to allow for pt to ambulate in her home.    Time 3   Period Weeks   Status Achieved   PT SHORT TERM GOAL #4   Title Pt will complete five time sit to stand test in 27 seconds to demonstrate improve BLE strength and functional mobility.    Time 2   Period Weeks   Status New           PT Long Term Goals - 06/02/15  1508    PT LONG TERM GOAL #1   Title Pt will complete functional transfers and bed mobility with supervision to allow for greater independence with self care.    Time 6   Period Weeks   Status Partially Met  Pt requires CGA-min A at times depending on level of fatigue   PT LONG TERM GOAL #2   Title Pt will ambulate 150 feet with RW with supervision to allow for independence with functional mobility and gait.    Time 6   Period Weeks   Status On-going   PT LONG TERM GOAL #3   Title Pt will demonstrate good static standing balance for 10 minutes to demonstrate improved functional activity tolerance and to allow for pt to complete self care.   Time 6   Period Weeks   Status On-going   PT LONG TERM GOAL #4   Title Pt will complete five time sit to stand in 23 seconds to demonstrate improved BLE strength and functional mobliity.    Time 6   Period Weeks   Status New               Plan - 06/11/15 1522    Clinical Impression Statement Pt demonstrated increased fatigue in today's treatment session. She was unable to ambulate >15 feet with hemiwalker with mod A from PT, and required constant verbal cueing during gait to maintain upright posture and knee extension. She required mod A with STS and stand pivot transfers today. Pt denied any pain or other sx that would be contributing to her impaired function today. Treatment focused on supine strengthening and seated balance activities.     PT Next Visit Plan Continue with standing strengthening and gait training with hemiwalker        Problem List Patient Active Problem List   Diagnosis Date Noted  . Cerebral infarction due to embolism of left carotid artery 03/04/2015  . Aneurysm, cerebral, nonruptured 03/04/2015  . History of ruptured arterial aneurysm 03/04/2015  . Essential hypertension 03/04/2015  . HLD (hyperlipidemia) 03/04/2015  . Alterations of sensations following CVA (cerebrovascular accident)   . Embolic cerebral  infarction 01/22/2015  . Left hemiparesis 01/22/2015  . Stroke   . Cerebral embolism with cerebral infarction 01/21/2015  . Ataxia   . Brain aneurysm 01/20/2015  . TOBACCO  ABUSE 01/04/2010  . BRONCHITIS, CHRONIC 01/04/2010  . CERVICAL CANCER 01/03/2010  . HYPERLIPIDEMIA 01/03/2010  . DEPRESSION 01/03/2010  . SUBARACHNOID HEMORRHAGE 01/03/2010  . [name] 01/03/2010    Hilma Favors, PT, DPT [phone] 06/11/2015, 3:25 PM  [facility] [address] [place], [place], [address] Phone: [phone]   Fax:  [phone]

## 2015-06-16 ENCOUNTER — Ambulatory Visit (HOSPITAL_COMMUNITY): Payer: Medicare Other | Attending: Physical Medicine & Rehabilitation | Admitting: Occupational Therapy

## 2015-06-16 ENCOUNTER — Encounter (HOSPITAL_COMMUNITY): Payer: Self-pay | Admitting: Occupational Therapy

## 2015-06-16 DIAGNOSIS — IMO0002 Reserved for concepts with insufficient information to code with codable children: Secondary | ICD-10-CM

## 2015-06-16 DIAGNOSIS — M6289 Other specified disorders of muscle: Secondary | ICD-10-CM | POA: Insufficient documentation

## 2015-06-16 DIAGNOSIS — R269 Unspecified abnormalities of gait and mobility: Secondary | ICD-10-CM | POA: Insufficient documentation

## 2015-06-16 DIAGNOSIS — G8190 Hemiplegia, unspecified affecting unspecified side: Secondary | ICD-10-CM | POA: Diagnosis not present

## 2015-06-16 DIAGNOSIS — R279 Unspecified lack of coordination: Secondary | ICD-10-CM | POA: Insufficient documentation

## 2015-06-16 DIAGNOSIS — M6281 Muscle weakness (generalized): Secondary | ICD-10-CM | POA: Diagnosis not present

## 2015-06-16 DIAGNOSIS — Z789 Other specified health status: Secondary | ICD-10-CM | POA: Insufficient documentation

## 2015-06-16 DIAGNOSIS — R262 Difficulty in walking, not elsewhere classified: Secondary | ICD-10-CM | POA: Insufficient documentation

## 2015-06-16 DIAGNOSIS — I6980 Unspecified sequelae of other cerebrovascular disease: Secondary | ICD-10-CM | POA: Insufficient documentation

## 2015-06-16 DIAGNOSIS — Z7409 Other reduced mobility: Secondary | ICD-10-CM | POA: Insufficient documentation

## 2015-06-16 DIAGNOSIS — R29898 Other symptoms and signs involving the musculoskeletal system: Secondary | ICD-10-CM

## 2015-06-16 DIAGNOSIS — R531 Weakness: Secondary | ICD-10-CM

## 2015-06-16 NOTE — Therapy (Signed)
[place], [place], [address] Phone: [phone]   Fax:  [phone]  Occupational Therapy Treatment  Patient Details  Name: Diane Hutchinson MRN: [id number] Date of Birth: 05-17-1946 Referring Provider:  Charlett Blake, MD  Encounter Date: 06/16/2015      OT End of Session - 06/16/15 1616    Visit Number 5   Number of Visits 18   Date for OT Re-Evaluation 08/01/15  mini reassessment 06/30/2015   Authorization Type Medicare/Medicare A & B   Authorization Time Period Before 10th visit   Authorization - Visit Number 5   Authorization - Number of Visits 10   OT Start Time 1300   OT Stop Time 1345   OT Time Calculation (min) 45 min   Activity Tolerance Patient tolerated treatment well   Behavior During Therapy [facility] for tasks assessed/performed      Past Medical History  Diagnosis Date  . Migraine   . Depression   . Hypertension   . Stroke 2016  . COPD (chronic obstructive pulmonary disease)   . Anxiety     h/o of panic attack  . GERD (gastroesophageal reflux disease)     occas. use of TUMS  . Arthritis     knees    Past Surgical History  Procedure Laterality Date  . [place] surgery  2010  . Cholecystectomy      age 37  . Appendectomy      age 26  . Eye surgery  years ago    laser to both eyes for blocked tear ducts   . Brain surgery  1993    aneurysm  . Tubal ligation  years ago  . Hemorroidectomy  years ago  . Radiology with anesthesia N/A 01/20/2015    Procedure: RADIOLOGY WITH ANESTHESIA;  Surgeon: Luanne Bras, MD;  Location: [place];  Service: Radiology;  Laterality: N/A;    There were no vitals filed for this visit.  Visit Diagnosis:  Left-sided weakness  Lack of coordination due to stroke  Decreased grip strength of left hand      Subjective Assessment - 06/16/15 1621    Subjective  S: My legs are so weak today, I can't stand up on my own.    Currently in Pain?  No/denies            [facility] OT Assessment - 06/16/15 1255    Assessment   Diagnosis CVA-left sided weakness   Precautions   Precautions Fall                  OT Treatments/Exercises (OP) - 06/16/15 1609    Transfers   Transfers Sit to Stand   Sit to Stand 3: Mod assist;With upper extremity assist;With armrests   Sit to Stand Details (indicate cue type and reason) Pt required mod assist to stand and during chair<>mat t/f. Pt was unsteady, with difficulty straightening knees and supporting weight on BLE.    Exercises   Exercises Shoulder;Wrist;Hand;Elbow   Shoulder Exercises: Supine   Protraction Strengthening;10 reps   Protraction Weight (lbs) 1   Horizontal ABduction AAROM;10 reps   External Rotation AAROM;10 reps   Internal Rotation AAROM;10 reps   Flexion AAROM;10 reps   ABduction AAROM;10 reps   Other Supine Exercises Focused on controlling movements during AAROM where OTR/L provided support and constant vc's for form and movement.   Shoulder Exercises: Seated   Protraction AROM;10 reps   Other Seated Exercises shoulder press AROM  10X   Elbow Exercises   Elbow Extension AROM;10 reps   Other elbow exercises elbow flexion AROM 10X   Functional Reaching Activities   Mid Level While seated, pt placed 6 yellow resistive clothespins and 3 red resistive clothespins on middle horizontal bar. Pt required mod assist from OTR for coordination in reaching the bar, was able to grasp clothespins in a pinch pattern with mod difficulty.                   OT Short Term Goals - 06/04/15 1506    OT SHORT TERM GOAL #1   Title Pt will be educated on and independent in HEP.    Time 4   Period Weeks   Status On-going   OT SHORT TERM GOAL #2   Title Pt will increase LUE strength to 4/5 to increase ability to assist in donning shirts.    Time 4   Period Weeks   Status On-going   OT SHORT TERM GOAL #3   Title Patient will increase grip strength by 5# and pinch  strength by 3# to increase ability to hold coffee cup.    Time 4   Period Weeks   Status On-going   OT SHORT TERM GOAL #4   Title Patient will increase fine motor coordination by completing 9 hole peg test within 2 minutes.    Time 4   Period Weeks   Status On-going           OT Long Term Goals - 06/04/15 1506    OT LONG TERM GOAL #1   Title Pt will complete all BADL tasks at Mod I level, including dressing, bathing, and meal preparation.    Time 8   Period Weeks   Status On-going   OT LONG TERM GOAL #2   Title Pt will increase LUE strength to 4+/5 to increase ability to complete daily tasks.    Time 8   Period Weeks   Status On-going   OT LONG TERM GOAL #3   Title Patient will increase grip strength by 10# and pinch strength by 5# to increase ability to grasp walker or surfaces during transfers and functional mobility tasks.    Time 8   Period Weeks   Status On-going   OT LONG TERM GOAL #4   Title Pt will increase fine motor coordination by completing 9 hole peg test in under 1 minute.    Time 8   Period Weeks   Status On-going               Plan - 06/16/15 1616    Clinical Impression Statement A: Pt reports that she is having a bad day at beginning of session. Pt and husband report she could not get out of bed without assistance and husband states he didn't know if he would be able to get her out of the car. Pt reports both her legs are weak and that she can't stand up well. Pt required mod assist during sit->stand, as well as stand pivot transfer from chair to mat. Pt became frustrated during session when she was unable to complete activities as she would prefer to. Continued to break activities/exercises down to lessen frustration, pt continued to do well with this approach.    Plan P: Cont to break down complex movements into simple segments. Grip and pinch activities, [place], LUE strengthening.         Problem List Patient Active Problem List   Diagnosis  Date Noted  . Cerebral infarction due to embolism of left carotid artery 03/04/2015  . Aneurysm, cerebral, nonruptured 03/04/2015  . History of ruptured arterial aneurysm 03/04/2015  . Essential hypertension 03/04/2015  . HLD (hyperlipidemia) 03/04/2015  . Alterations of sensations following CVA (cerebrovascular accident)   . Embolic cerebral infarction 01/22/2015  . Left hemiparesis 01/22/2015  . Stroke   . Cerebral embolism with cerebral infarction 01/21/2015  . Ataxia   . Brain aneurysm 01/20/2015  . TOBACCO ABUSE 01/04/2010  . BRONCHITIS, CHRONIC 01/04/2010  . CERVICAL CANCER 01/03/2010  . HYPERLIPIDEMIA 01/03/2010  . DEPRESSION 01/03/2010  . SUBARACHNOID HEMORRHAGE 01/03/2010  . [name] 01/03/2010    Guadelupe Sabin, OTR/L  [phone]  06/16/2015, 4:22 PM  [place] [address] [place], [place], [address] Phone: [phone]   Fax:  [phone]

## 2015-06-18 ENCOUNTER — Ambulatory Visit (HOSPITAL_COMMUNITY): Payer: Medicare Other

## 2015-06-18 DIAGNOSIS — G8190 Hemiplegia, unspecified affecting unspecified side: Secondary | ICD-10-CM | POA: Diagnosis not present

## 2015-06-21 ENCOUNTER — Ambulatory Visit (HOSPITAL_COMMUNITY): Payer: Medicare Other | Admitting: Occupational Therapy

## 2015-06-21 ENCOUNTER — Encounter (HOSPITAL_COMMUNITY): Payer: Self-pay | Admitting: Occupational Therapy

## 2015-06-21 DIAGNOSIS — Z789 Other specified health status: Secondary | ICD-10-CM

## 2015-06-21 DIAGNOSIS — R29898 Other symptoms and signs involving the musculoskeletal system: Secondary | ICD-10-CM | POA: Diagnosis not present

## 2015-06-21 DIAGNOSIS — R279 Unspecified lack of coordination: Secondary | ICD-10-CM | POA: Diagnosis not present

## 2015-06-21 DIAGNOSIS — M6289 Other specified disorders of muscle: Secondary | ICD-10-CM | POA: Diagnosis not present

## 2015-06-21 DIAGNOSIS — IMO0002 Reserved for concepts with insufficient information to code with codable children: Secondary | ICD-10-CM

## 2015-06-21 DIAGNOSIS — I6980 Unspecified sequelae of other cerebrovascular disease: Secondary | ICD-10-CM | POA: Diagnosis not present

## 2015-06-21 DIAGNOSIS — R262 Difficulty in walking, not elsewhere classified: Secondary | ICD-10-CM | POA: Diagnosis not present

## 2015-06-21 DIAGNOSIS — M6281 Muscle weakness (generalized): Secondary | ICD-10-CM | POA: Diagnosis not present

## 2015-06-21 NOTE — Therapy (Signed)
[place], [place], [address] Phone: [phone]   Fax:  [phone]  Occupational Therapy Treatment  Patient Details  Name: Diane Hutchinson MRN: [id number] Date of Birth: 11-16-1945 Referring Provider:  Charlett Blake, MD  Encounter Date: 06/21/2015      OT End of Session - 06/21/15 1612    Visit Number 6   Number of Visits 18   Date for OT Re-Evaluation 08/01/15  mini reassessment 06/30/2015   Authorization Type Medicare/Medicare A & B   Authorization Time Period Before 10th visit   Authorization - Visit Number 6   Authorization - Number of Visits 10   OT Start Time 1300   OT Stop Time 1346   OT Time Calculation (min) 46 min   Activity Tolerance Patient tolerated treatment well   Behavior During Therapy [facility] for tasks assessed/performed      Past Medical History  Diagnosis Date  . Migraine   . Depression   . Hypertension   . Stroke 2016  . COPD (chronic obstructive pulmonary disease)   . Anxiety     h/o of panic attack  . GERD (gastroesophageal reflux disease)     occas. use of TUMS  . Arthritis     knees    Past Surgical History  Procedure Laterality Date  . [place] surgery  2010  . Cholecystectomy      age 67  . Appendectomy      age 18  . Eye surgery  years ago    laser to both eyes for blocked tear ducts   . Brain surgery  1993    aneurysm  . Tubal ligation  years ago  . Hemorroidectomy  years ago  . Radiology with anesthesia N/A 01/20/2015    Procedure: RADIOLOGY WITH ANESTHESIA;  Surgeon: Luanne Bras, MD;  Location: [place];  Service: Radiology;  Laterality: N/A;    There were no vitals filed for this visit.  Visit Diagnosis:  Lack of coordination due to stroke  Decreased grip strength of left hand  Decreased pinch strength  Decreased activities of daily living (ADL)      Subjective Assessment - 06/21/15 1559    Subjective  S: I'm feeling better than I was the  other day.    Currently in Pain? No/denies            [facility] OT Assessment - 06/21/15 1256    Assessment   Diagnosis CVA-left sided weakness   Precautions   Precautions Fall                  OT Treatments/Exercises (OP) - 06/21/15 1600    Bed Mobility   Supine to Sit 4: Min assist   Sit to Supine 4: Min assist   Transfers   Transfers Sit to Stand;Stand to Sit   Sit to Stand 3: Mod assist;With upper extremity assist;With armrests   Sit to Stand Details (indicate cue type and reason) Pt required mod assist to stand and during chair<>mat t/f. Pt was unsteady, with difficulty straightening knees and supporting weight on BLE. Pt required mod verbal cuing for stand-pivot t/f   Stand to Sit 3: Mod assist   Exercises   Exercises Shoulder;Wrist;Hand;Elbow   Shoulder Exercises: Supine   Protraction Strengthening;10 reps   Protraction Weight (lbs) 1   Horizontal ABduction Strengthening;10 reps;Limitations   Horizontal ABduction Weight (lbs) 1   Horizontal ABduction Limitations Pt required min assist from OTR for correct form/technique  External Rotation Strengthening;10 reps;Limitations   External Rotation Weight (lbs) 1   External Rotation Limitations Pt required min assist from OTR for correct form/technique   Internal Rotation Strengthening;10 reps;Limitations   Internal Rotation Weight (lbs) 1   Internal Rotation Limitations Pt required min assist from OTR for correct form/technique   Flexion Strengthening;10 reps;Limitations   Shoulder Flexion Weight (lbs) 1   Flexion Limitations Pt required min assist from OTR for correct form/technique   ABduction Strengthening;10 reps;Limitations   Shoulder ABduction Weight (lbs) 1   ABduction Limitations Pt required min assist from OTR for correct form/technique   Other Supine Exercises Focused on controlling movements during strengthening where OTR/L provided support and constant vc's for form and movement.   Shoulder Exercises:  Seated   Protraction AROM;10 reps   Other Seated Exercises shoulder press AROM 10X   Elbow Exercises   Bar Weights/Barbell (Elbow Flexion) 1 lb   Elbow Extension Strengthening;10 reps   Bar Weights/Barbell (Elbow Extension) 1 lb   Other elbow exercises elbow flexion strengthening 10X   Fine Motor Coordination   Fine Motor Coordination Large Pegboard   Large Pegboard Pt used large multi-colored pegs to place into blue pegboard using left hand. 1# wrist weight worn to decrease ataxic movements and increase proprioception. Pt reports she felt better completing activity with wrist weight. Pt had max difficulty using only left hand. Pt instructed to use right hand to place peg into left digits and use RUE to guide left hand when placing pegs. Pt able to place 10 pegs in this fashion, with limited frustration.                   OT Short Term Goals - 06/04/15 1506    OT SHORT TERM GOAL #1   Title Pt will be educated on and independent in HEP.    Time 4   Period Weeks   Status On-going   OT SHORT TERM GOAL #2   Title Pt will increase LUE strength to 4/5 to increase ability to assist in donning shirts.    Time 4   Period Weeks   Status On-going   OT SHORT TERM GOAL #3   Title Patient will increase grip strength by 5# and pinch strength by 3# to increase ability to hold coffee cup.    Time 4   Period Weeks   Status On-going   OT SHORT TERM GOAL #4   Title Patient will increase fine motor coordination by completing 9 hole peg test within 2 minutes.    Time 4   Period Weeks   Status On-going           OT Long Term Goals - 06/04/15 1506    OT LONG TERM GOAL #1   Title Pt will complete all BADL tasks at Mod I level, including dressing, bathing, and meal preparation.    Time 8   Period Weeks   Status On-going   OT LONG TERM GOAL #2   Title Pt will increase LUE strength to 4+/5 to increase ability to complete daily tasks.    Time 8   Period Weeks   Status On-going   OT  LONG TERM GOAL #3   Title Patient will increase grip strength by 10# and pinch strength by 5# to increase ability to grasp walker or surfaces during transfers and functional mobility tasks.    Time 8   Period Weeks   Status On-going   OT LONG TERM GOAL #4  Title Pt will increase fine motor coordination by completing 9 hole peg test in under 1 minute.    Time 8   Period Weeks   Status On-going               Plan - 06/21/15 1612    Clinical Impression Statement A: Pt comes in & reports that she is feeling better than she was last week. Pt continues to require mod assist during transfers this session. Pt demonstrates lethargy during supine exercises, requiring verbal cuing to open eyes and watch her LUE, as well as min assist and verbal cuing for form & technique during exercises. Pt able to complete pegboard activity with greater success this session.    Plan P: Cont breaking down complex movements, using RUE as assist to LUE when needed. [facility] activity.         Problem List Patient Active Problem List   Diagnosis Date Noted  . Cerebral infarction due to embolism of left carotid artery 03/04/2015  . Aneurysm, cerebral, nonruptured 03/04/2015  . History of ruptured arterial aneurysm 03/04/2015  . Essential hypertension 03/04/2015  . HLD (hyperlipidemia) 03/04/2015  . Alterations of sensations following CVA (cerebrovascular accident)   . Embolic cerebral infarction 01/22/2015  . Left hemiparesis 01/22/2015  . Stroke   . Cerebral embolism with cerebral infarction 01/21/2015  . Ataxia   . Brain aneurysm 01/20/2015  . TOBACCO ABUSE 01/04/2010  . BRONCHITIS, CHRONIC 01/04/2010  . CERVICAL CANCER 01/03/2010  . HYPERLIPIDEMIA 01/03/2010  . DEPRESSION 01/03/2010  . SUBARACHNOID HEMORRHAGE 01/03/2010  . [name] 01/03/2010    Guadelupe Sabin, OTR/L  [phone]  06/21/2015, 4:18 PM  [place] [address] [place], [place],  [address] Phone: [phone]   Fax:  [phone]

## 2015-06-23 ENCOUNTER — Ambulatory Visit (HOSPITAL_COMMUNITY): Payer: Medicare Other

## 2015-06-23 ENCOUNTER — Encounter (HOSPITAL_COMMUNITY): Payer: Self-pay

## 2015-06-23 DIAGNOSIS — IMO0002 Reserved for concepts with insufficient information to code with codable children: Secondary | ICD-10-CM

## 2015-06-23 DIAGNOSIS — R531 Weakness: Secondary | ICD-10-CM

## 2015-06-23 DIAGNOSIS — R269 Unspecified abnormalities of gait and mobility: Secondary | ICD-10-CM

## 2015-06-23 DIAGNOSIS — R29898 Other symptoms and signs involving the musculoskeletal system: Secondary | ICD-10-CM

## 2015-06-23 DIAGNOSIS — G8194 Hemiplegia, unspecified affecting left nondominant side: Secondary | ICD-10-CM

## 2015-06-23 DIAGNOSIS — R262 Difficulty in walking, not elsewhere classified: Secondary | ICD-10-CM | POA: Diagnosis not present

## 2015-06-23 DIAGNOSIS — I6980 Unspecified sequelae of other cerebrovascular disease: Secondary | ICD-10-CM | POA: Diagnosis not present

## 2015-06-23 DIAGNOSIS — M6281 Muscle weakness (generalized): Secondary | ICD-10-CM | POA: Diagnosis not present

## 2015-06-23 DIAGNOSIS — Z7409 Other reduced mobility: Secondary | ICD-10-CM

## 2015-06-23 DIAGNOSIS — M6289 Other specified disorders of muscle: Secondary | ICD-10-CM | POA: Diagnosis not present

## 2015-06-23 DIAGNOSIS — R279 Unspecified lack of coordination: Secondary | ICD-10-CM | POA: Diagnosis not present

## 2015-06-23 NOTE — Therapy (Signed)
Diane Hutchinson, [place], [address] Phone: [phone]   Fax:  [phone]  Physical Therapy Treatment  Patient Details  Name: Diane Hutchinson MRN: [id number] Date of Birth: 1946-01-11 Referring Provider:  Charlett Blake, MD  Encounter Date: 06/23/2015      PT End of Session - 06/23/15 1429    Visit Number 15   Number of Visits 18   Date for PT Re-Evaluation 07/03/15   Authorization Type Medicare   Authorization - Visit Number 15   Authorization - Number of Visits 20   PT Start Time 1350   PT Stop Time 1432   PT Time Calculation (min) 42 min   Equipment Utilized During Treatment Gait belt   Activity Tolerance Patient tolerated treatment well;Patient limited by fatigue   Behavior During Therapy [facility] for tasks assessed/performed      Past Medical History  Diagnosis Date  . Migraine   . Depression   . Hypertension   . Stroke 2016  . COPD (chronic obstructive pulmonary disease)   . Anxiety     h/o of panic attack  . GERD (gastroesophageal reflux disease)     occas. use of TUMS  . Arthritis     knees    Past Surgical History  Procedure Laterality Date  . [place] surgery  2010  . Cholecystectomy      age 69  . Appendectomy      age 69  . Eye surgery  years ago    laser to both eyes for blocked tear ducts   . Brain surgery  1993    aneurysm  . Tubal ligation  years ago  . Hemorroidectomy  years ago  . Radiology with anesthesia N/A 01/20/2015    Procedure: RADIOLOGY WITH ANESTHESIA;  Surgeon: Luanne Bras, MD;  Location: [place];  Service: Radiology;  Laterality: N/A;    There were no vitals filed for this visit.  Visit Diagnosis:  Lack of coordination due to stroke  Left-sided weakness  Difficulty walking  Abnormality of gait  Impaired functional mobility and activity tolerance  Hemiparesis, left      Subjective Assessment - 06/23/15 1425    Subjective Pt stated last week she  took pills before therapy, very drowsy.  Stated she continues to have difficutly getting in and out of chairs and walking.     Currently in Pain? No/denies              [facility] Adult PT Treatment/Exercise - 06/23/15 0001    Ambulation/Gait   Ambulation/Gait Yes   Ambulation/Gait Assistance 4: Min assist   Ambulation Distance (Feet) 226 Feet  3 sets   Assistive device Hemi-walker   Gait Pattern Decreased step length - right;Decreased stance time - left;Decreased weight shift to left   Exercises   Exercises Knee/Hip   Knee/Hip Exercises: Aerobic   Nustep 10 min UE/LE level 3   Knee/Hip Exercises: Standing   Forward Lunges Both;1 set;10 reps   Forward Lunges Limitations Mod(A) to safely perform with L LE in stance    Functional Squat 10 reps   Other Standing Knee Exercises sidestepping in // bars x 3 RT   Other Standing Knee Exercises weight shifting 10x; 10 STS with cueing for safety (locking brakes and handplacement)             PT Short Term Goals - 06/02/15 1506    PT SHORT TERM GOAL #1   Title Patient will complete functional  transfers with min assist in order to decrease burden on caregivers and increase independence with functional mobility.    Baseline Mod assist   Time 3   Period Weeks   Status Achieved  Pt requires supervision-CGA to complete transfers   PT SHORT TERM GOAL #2   Title Pt will complete bed mobility with min assist to allow for increased independence.   Baseline Mod assist   Time 3   Period Weeks   Status Achieved  Pt requires supervision-min A to complete bed mobility depending on level of fatigue   PT SHORT TERM GOAL #3   Title Pt will ambulate 15 feet with RW with min assist in order to improve functional mobility and to allow for pt to ambulate in her home.    Time 3   Period Weeks   Status Achieved   PT SHORT TERM GOAL #4   Title Pt will complete five time sit to stand test in 27 seconds to demonstrate improve BLE strength and  functional mobility.    Time 2   Period Weeks   Status New           PT Long Term Goals - 06/02/15 1508    PT LONG TERM GOAL #1   Title Pt will complete functional transfers and bed mobility with supervision to allow for greater independence with self care.    Time 6   Period Weeks   Status Partially Met  Pt requires CGA-min A at times depending on level of fatigue   PT LONG TERM GOAL #2   Title Pt will ambulate 150 feet with RW with supervision to allow for independence with functional mobility and gait.    Time 6   Period Weeks   Status On-going   PT LONG TERM GOAL #3   Title Pt will demonstrate good static standing balance for 10 minutes to demonstrate improved functional activity tolerance and to allow for pt to complete self care.   Time 6   Period Weeks   Status On-going   PT LONG TERM GOAL #4   Title Pt will complete five time sit to stand in 23 seconds to demonstrate improved BLE strength and functional mobliity.    Time 6   Period Weeks   Status New               Plan - 06/23/15 1430    Clinical Impression Statement Pt continues to require cueing for safety prior sit to stands with locking breakes and handplacement, min cueing for proper sequence with hemiwalker to reduce risk of falls.  Pt able to verablize appropriate technqiue but unconsistent with proper pattern.  Session focus on improving gait training and functional strengthening with therapist facilitation to improve Lt foot placement and form with exercises.     PT Next Visit Plan Continue with standing strengthening and gait training with hemiwalker        Problem List Patient Active Problem List   Diagnosis Date Noted  . Cerebral infarction due to embolism of left carotid artery 03/04/2015  . Aneurysm, cerebral, nonruptured 03/04/2015  . History of ruptured arterial aneurysm 03/04/2015  . Essential hypertension 03/04/2015  . HLD (hyperlipidemia) 03/04/2015  . Alterations of sensations  following CVA (cerebrovascular accident)   . Embolic cerebral infarction 01/22/2015  . Left hemiparesis 01/22/2015  . Stroke   . Cerebral embolism with cerebral infarction 01/21/2015  . Ataxia   . Brain aneurysm 01/20/2015  . TOBACCO ABUSE 01/04/2010  . BRONCHITIS, CHRONIC  01/04/2010  . CERVICAL CANCER 01/03/2010  . HYPERLIPIDEMIA 01/03/2010  . DEPRESSION 01/03/2010  . SUBARACHNOID HEMORRHAGE 01/03/2010  . [name] 01/03/2010   Ihor Austin, LPTA; CBIS [phone]  Aldona Lento 06/23/2015, 2:48 PM  [place] [address] [place], [place], [address] Phone: [phone]   Fax:  [phone]

## 2015-06-23 NOTE — Therapy (Signed)
[place], [place], [address] Phone: [phone]   Fax:  [phone]  Occupational Therapy Treatment  Patient Details  Name: Diane Hutchinson MRN: [id number] Date of Birth: 08-Apr-1946 Referring Provider:  Charlett Blake, MD  Encounter Date: 06/23/2015      OT End of Session - 06/23/15 1648    Visit Number 7   Number of Visits 18   Date for OT Re-Evaluation 08/01/15  mini reassessment 06/30/2015   Authorization Type Medicare/Medicare A & B   Authorization Time Period Before 10th visit   Authorization - Visit Number 7   Authorization - Number of Visits 10   OT Start Time 1432   OT Stop Time 1517   OT Time Calculation (min) 45 min   Activity Tolerance Patient tolerated treatment well   Behavior During Therapy [facility] for tasks assessed/performed      Past Medical History  Diagnosis Date  . Migraine   . Depression   . Hypertension   . Stroke 2016  . COPD (chronic obstructive pulmonary disease)   . Anxiety     h/o of panic attack  . GERD (gastroesophageal reflux disease)     occas. use of TUMS  . Arthritis     knees    Past Surgical History  Procedure Laterality Date  . [place] surgery  2010  . Cholecystectomy      age 41  . Appendectomy      age 20  . Eye surgery  years ago    laser to both eyes for blocked tear ducts   . Brain surgery  1993    aneurysm  . Tubal ligation  years ago  . Hemorroidectomy  years ago  . Radiology with anesthesia N/A 01/20/2015    Procedure: RADIOLOGY WITH ANESTHESIA;  Surgeon: Luanne Bras, MD;  Location: [facility];  Service: Radiology;  Laterality: N/A;    There were no vitals filed for this visit.  Visit Diagnosis:  Lack of coordination due to stroke  Decreased grip strength of left hand  Decreased pinch strength      Subjective Assessment - 06/23/15 1450    Subjective  S: I ain't getting no where fast.   Patient is accompained by: Family member   Currently in Pain? No/denies            [facility] OT Assessment - 06/23/15 1453    Assessment   Diagnosis CVA-left sided weakness   Precautions   Precautions Fall   Coordination   Box and Blocks Left: 17 Right: 37                  OT Treatments/Exercises (OP) - 06/23/15 1454    Exercises   Exercises Hand   Hand Exercises   Theraputty - Locate Pegs Red-7/7 beads. Pt used left hand to flatten and pull putty while locating beads. Increased time needed with mod-max difficulty.    Fine Motor Coordination   Fine Motor Coordination Flipping cards   Flipping cards Pt flipped one card at a time from deck using Left hand. Therapist provided stability to deck of cards to prevent deck from falling over.                   OT Short Term Goals - 06/04/15 1506    OT SHORT TERM GOAL #1   Title Pt will be educated on and independent in HEP.    Time 4   Period Weeks  Status On-going   OT SHORT TERM GOAL #2   Title Pt will increase LUE strength to 4/5 to increase ability to assist in donning shirts.    Time 4   Period Weeks   Status On-going   OT SHORT TERM GOAL #3   Title Patient will increase grip strength by 5# and pinch strength by 3# to increase ability to hold coffee cup.    Time 4   Period Weeks   Status On-going   OT SHORT TERM GOAL #4   Title Patient will increase fine motor coordination by completing 9 hole peg test within 2 minutes.    Time 4   Period Weeks   Status On-going           OT Long Term Goals - 06/04/15 1506    OT LONG TERM GOAL #1   Title Pt will complete all BADL tasks at Mod I level, including dressing, bathing, and meal preparation.    Time 8   Period Weeks   Status On-going   OT LONG TERM GOAL #2   Title Pt will increase LUE strength to 4+/5 to increase ability to complete daily tasks.    Time 8   Period Weeks   Status On-going   OT LONG TERM GOAL #3   Title Patient will increase grip strength by 10# and pinch strength by 5#  to increase ability to grasp walker or surfaces during transfers and functional mobility tasks.    Time 8   Period Weeks   Status On-going   OT LONG TERM GOAL #4   Title Pt will increase fine motor coordination by completing 9 hole peg test in under 1 minute.    Time 8   Period Weeks   Status On-going               Plan - 06/23/15 1649    Clinical Impression Statement A: Pinch strength and coordination focused on this date. Patient performed moderately well with increased and vc's for technique with all tasks.    Plan P: cont with breaking down complex movements. Cont with [place], grip and pinch strengthening.         Problem List Patient Active Problem List   Diagnosis Date Noted  . Cerebral infarction due to embolism of left carotid artery 03/04/2015  . Aneurysm, cerebral, nonruptured 03/04/2015  . History of ruptured arterial aneurysm 03/04/2015  . Essential hypertension 03/04/2015  . HLD (hyperlipidemia) 03/04/2015  . Alterations of sensations following CVA (cerebrovascular accident)   . Embolic cerebral infarction 01/22/2015  . Left hemiparesis 01/22/2015  . Stroke   . Cerebral embolism with cerebral infarction 01/21/2015  . Ataxia   . Brain aneurysm 01/20/2015  . TOBACCO ABUSE 01/04/2010  . BRONCHITIS, CHRONIC 01/04/2010  . CERVICAL CANCER 01/03/2010  . HYPERLIPIDEMIA 01/03/2010  . DEPRESSION 01/03/2010  . SUBARACHNOID HEMORRHAGE 01/03/2010  . [name] 01/03/2010    Ailene Ravel, OTR/L,CBIS  [phone]  06/23/2015, 4:53 PM  [facility] [address] [place], [place], [address] Phone: [phone]   Fax:  [phone]

## 2015-06-25 ENCOUNTER — Encounter (HOSPITAL_COMMUNITY): Payer: Self-pay

## 2015-06-25 ENCOUNTER — Ambulatory Visit (HOSPITAL_COMMUNITY): Payer: Medicare Other

## 2015-06-25 ENCOUNTER — Ambulatory Visit (HOSPITAL_COMMUNITY): Payer: Medicare Other | Admitting: Physical Therapy

## 2015-06-25 DIAGNOSIS — R269 Unspecified abnormalities of gait and mobility: Secondary | ICD-10-CM

## 2015-06-25 DIAGNOSIS — R531 Weakness: Secondary | ICD-10-CM

## 2015-06-25 DIAGNOSIS — IMO0002 Reserved for concepts with insufficient information to code with codable children: Secondary | ICD-10-CM

## 2015-06-25 DIAGNOSIS — R29898 Other symptoms and signs involving the musculoskeletal system: Secondary | ICD-10-CM | POA: Diagnosis not present

## 2015-06-25 DIAGNOSIS — M6281 Muscle weakness (generalized): Secondary | ICD-10-CM | POA: Diagnosis not present

## 2015-06-25 DIAGNOSIS — R279 Unspecified lack of coordination: Secondary | ICD-10-CM | POA: Diagnosis not present

## 2015-06-25 DIAGNOSIS — I6980 Unspecified sequelae of other cerebrovascular disease: Secondary | ICD-10-CM | POA: Diagnosis not present

## 2015-06-25 DIAGNOSIS — M6289 Other specified disorders of muscle: Secondary | ICD-10-CM | POA: Diagnosis not present

## 2015-06-25 DIAGNOSIS — R262 Difficulty in walking, not elsewhere classified: Secondary | ICD-10-CM

## 2015-06-25 DIAGNOSIS — Z7409 Other reduced mobility: Secondary | ICD-10-CM

## 2015-06-25 NOTE — Therapy (Signed)
[facility] [facility] [place], [place], [address] Phone: [phone]   Fax:  [phone]  Physical Therapy Treatment  Patient Details  Name: Diane Hutchinson MRN: [id number] Date of Birth: [birth year] Referring Provider:  Neale Burly, MD  Encounter Date: 06/25/2015      PT End of Session - 06/25/15 1446    Visit Number 16   Number of Visits 18   Date for PT Re-Evaluation 07/03/15   Authorization Type Medicare   Authorization - Visit Number 16   Authorization - Number of Visits 20   PT Start Time 2800   PT Stop Time 1431   PT Time Calculation (min) 46 min   Equipment Utilized During Treatment Gait belt   Activity Tolerance Patient limited by fatigue;Patient limited by lethargy   Behavior During Therapy [facility] for tasks assessed/performed      Past Medical History  Diagnosis Date  . Migraine   . Depression   . Hypertension   . Stroke 2016  . COPD (chronic obstructive pulmonary disease)   . Anxiety     h/o of panic attack  . GERD (gastroesophageal reflux disease)     occas. use of TUMS  . Arthritis     knees    Past Surgical History  Procedure Laterality Date  . [place] surgery  2010  . Cholecystectomy      age 36  . Appendectomy      age 5  . Eye surgery  years ago    laser to both eyes for blocked tear ducts   . Brain surgery  1993    aneurysm  . Tubal ligation  years ago  . Hemorroidectomy  years ago  . Radiology with anesthesia N/A 01/20/2015    Procedure: RADIOLOGY WITH ANESTHESIA;  Surgeon: Luanne Bras, MD;  Location: [facility];  Service: Radiology;  Laterality: N/A;    There were no vitals filed for this visit.  Visit Diagnosis:  Left-sided weakness  Difficulty walking  Abnormality of gait  Impaired functional mobility and activity tolerance      Subjective Assessment - 06/25/15 1345    Subjective Pt reports that she feels fine today. She and her husband report that she is still having  difficulty getting up out of bed or out of a chair. Husband reports that it seems that she has been having more difficulty since beginning PT. Pt reports that her MD called the other day and told her that she has another aneurysm, she doesn't know if she wants to undergo another surgery.    Currently in Pain? No/denies   Pain Score 0-No pain                 [facility] Adult PT Treatment/Exercise - 06/25/15 1432    Transfers   Sit to Stand 3: Mod assist;With upper extremity assist;With armrests   Sit to Stand Details (indicate cue type and reason) Pt required mod A with sit to stand transfers in // bars to prevent knee buckling and for upright posture.    Ambulation/Gait   Ambulation/Gait Yes   Ambulation/Gait Assistance 3: Mod assist   Ambulation Distance (Feet) 20 Feet  x3   Assistive device Hemi-walker   Gait Pattern Decreased step length - right;Decreased stance time - left;Decreased weight shift to left   Knee/Hip Exercises: Standing   Other Standing Knee Exercises weight shifting x 10 in // bars   Knee/Hip Exercises: Seated   Long Arc Quad 10 reps  Long Arc Quad Weight 3 lbs.   Sit to [employer] 10 reps                PT Education - 06/25/15 1446    Education provided Yes   Education Details Educated on safety with transfers and ambulation   Person(s) Educated Patient;Spouse   Methods Explanation;Demonstration   Comprehension Verbalized understanding          PT Short Term Goals - 06/02/15 1506    PT SHORT TERM GOAL #1   Title Patient will complete functional transfers with min assist in order to decrease burden on caregivers and increase independence with functional mobility.    Baseline Mod assist   Time 3   Period Weeks   Status Achieved  Pt requires supervision-CGA to complete transfers   PT SHORT TERM GOAL #2   Title Pt will complete bed mobility with min assist to allow for increased independence.   Baseline Mod assist   Time 3   Period Weeks    Status Achieved  Pt requires supervision-min A to complete bed mobility depending on level of fatigue   PT SHORT TERM GOAL #3   Title Pt will ambulate 15 feet with RW with min assist in order to improve functional mobility and to allow for pt to ambulate in her home.    Time 3   Period Weeks   Status Achieved   PT SHORT TERM GOAL #4   Title Pt will complete five time sit to stand test in 27 seconds to demonstrate improve BLE strength and functional mobility.    Time 2   Period Weeks   Status New           PT Long Term Goals - 06/02/15 1508    PT LONG TERM GOAL #1   Title Pt will complete functional transfers and bed mobility with supervision to allow for greater independence with self care.    Time 6   Period Weeks   Status Partially Met  Pt requires CGA-min A at times depending on level of fatigue   PT LONG TERM GOAL #2   Title Pt will ambulate 150 feet with RW with supervision to allow for independence with functional mobility and gait.    Time 6   Period Weeks   Status On-going   PT LONG TERM GOAL #3   Title Pt will demonstrate good static standing balance for 10 minutes to demonstrate improved functional activity tolerance and to allow for pt to complete self care.   Time 6   Period Weeks   Status On-going   PT LONG TERM GOAL #4   Title Pt will complete five time sit to stand in 23 seconds to demonstrate improved BLE strength and functional mobliity.    Time 6   Period Weeks   Status New               Plan - 06/25/15 1447    Clinical Impression Statement Pt appeared to demonstrate increased lethargy in treatment session today. She required constant cueing during sit to stand training for proper technique, as well as to stay on task. She was demonstrating increased confusion today, having difficulty with following commands and with sequencing. Pt had difficulty with gait training today, requiring constant cueing for sequencing with hemiwalker, and was only able  to ambulate 20' at a time.    PT Next Visit Plan Continue with standing strengthening and gait training with hemiwalker if tolerated  Problem List Patient Active Problem List   Diagnosis Date Noted  . Cerebral infarction due to embolism of left carotid artery 03/04/2015  . Aneurysm, cerebral, nonruptured 03/04/2015  . History of ruptured arterial aneurysm 03/04/2015  . Essential hypertension 03/04/2015  . HLD (hyperlipidemia) 03/04/2015  . Alterations of sensations following CVA (cerebrovascular accident)   . Embolic cerebral infarction 01/22/2015  . Left hemiparesis 01/22/2015  . Stroke   . Cerebral embolism with cerebral infarction 01/21/2015  . Ataxia   . Brain aneurysm 01/20/2015  . TOBACCO ABUSE 01/04/2010  . BRONCHITIS, CHRONIC 01/04/2010  . CERVICAL CANCER 01/03/2010  . HYPERLIPIDEMIA 01/03/2010  . DEPRESSION 01/03/2010  . SUBARACHNOID HEMORRHAGE 01/03/2010  . [name] 01/03/2010    Hilma Favors, PT, DPT [phone] 06/25/2015, 4:36 PM  [facility] [address] [place], [place], [address] Phone: [phone]   Fax:  [phone]

## 2015-06-25 NOTE — Therapy (Addendum)
[place], [place], [address] Phone: [phone]   Fax:  [phone]  Occupational Therapy Treatment  Patient Details  Name: Diane Hutchinson MRN: [id number] Date of Birth: 1946-04-21 Referring Provider:  Neale Burly, MD  Encounter Date: 06/25/2015      OT End of Session - 06/25/15 1418    Visit Number 8   Number of Visits 18   Date for OT Re-Evaluation 08/01/15  mini reassessment 06/30/2015   Authorization Type Medicare/Medicare A & B   Authorization Time Period Before 10th visit   Authorization - Visit Number 8   Authorization - Number of Visits 10   OT Start Time 9518   OT Stop Time 1345   OT Time Calculation (min) 40 min   Activity Tolerance Patient tolerated treatment well   Behavior During Therapy [facility] for tasks assessed/performed      Past Medical History  Diagnosis Date  . Migraine   . Depression   . Hypertension   . Stroke 2016  . COPD (chronic obstructive pulmonary disease)   . Anxiety     h/o of panic attack  . GERD (gastroesophageal reflux disease)     occas. use of TUMS  . Arthritis     knees    Past Surgical History  Procedure Laterality Date  . [place] surgery  2010  . Cholecystectomy      age 27  . Appendectomy      age 70  . Eye surgery  years ago    laser to both eyes for blocked tear ducts   . Brain surgery  1993    aneurysm  . Tubal ligation  years ago  . Hemorroidectomy  years ago  . Radiology with anesthesia N/A 01/20/2015    Procedure: RADIOLOGY WITH ANESTHESIA;  Surgeon: Luanne Bras, MD;  Location: [facility];  Service: Radiology;  Laterality: N/A;    There were no vitals filed for this visit.  Visit Diagnosis:  Left-sided weakness  Lack of coordination due to stroke                    OT Treatments/Exercises (OP) - 06/25/15 1313    Exercises   Exercises Hand         06/25/15 1313  Exercises  Exercises Hand  Hand Exercises  Hand  Gripper with Large Beads 6/6 beads gripper set at 7#  Hand Gripper with Medium Beads 13/13 beads with gripper set at 7#  Hand Gripper with Small Beads 12/12 beads with gripper set at 7#  Other Hand Exercises Using small plastic tongs patient picked up low resistance sponges and placed in container; increased time to complete with several rest breaks.               OT Short Term Goals - 06/04/15 1506    OT SHORT TERM GOAL #1   Title Pt will be educated on and independent in HEP.    Time 4   Period Weeks   Status On-going   OT SHORT TERM GOAL #2   Title Pt will increase LUE strength to 4/5 to increase ability to assist in donning shirts.    Time 4   Period Weeks   Status On-going   OT SHORT TERM GOAL #3   Title Patient will increase grip strength by 5# and pinch strength by 3# to increase ability to hold coffee cup.    Time 4   Period Weeks  Status On-going   OT SHORT TERM GOAL #4   Title Patient will increase fine motor coordination by completing 9 hole peg test within 2 minutes.    Time 4   Period Weeks   Status On-going           OT Long Term Goals - 06/04/15 1506    OT LONG TERM GOAL #1   Title Pt will complete all BADL tasks at Mod I level, including dressing, bathing, and meal preparation.    Time 8   Period Weeks   Status On-going   OT LONG TERM GOAL #2   Title Pt will increase LUE strength to 4+/5 to increase ability to complete daily tasks.    Time 8   Period Weeks   Status On-going   OT LONG TERM GOAL #3   Title Patient will increase grip strength by 10# and pinch strength by 5# to increase ability to grasp walker or surfaces during transfers and functional mobility tasks.    Time 8   Period Weeks   Status On-going   OT LONG TERM GOAL #4   Title Pt will increase fine motor coordination by completing 9 hole peg test in under 1 minute.    Time 8   Period Weeks   Status On-going               Plan - 06/25/15 1422    Clinical Impression  Statement A: Pt performed the best when given 2 beads at a time and then rested versus completing the entire task placed before her.   Plan P: Cont with sponge activity. Add functional reaching task.        Problem List Patient Active Problem List   Diagnosis Date Noted  . Cerebral infarction due to embolism of left carotid artery 03/04/2015  . Aneurysm, cerebral, nonruptured 03/04/2015  . History of ruptured arterial aneurysm 03/04/2015  . Essential hypertension 03/04/2015  . HLD (hyperlipidemia) 03/04/2015  . Alterations of sensations following CVA (cerebrovascular accident)   . Embolic cerebral infarction 01/22/2015  . Left hemiparesis 01/22/2015  . Stroke   . Cerebral embolism with cerebral infarction 01/21/2015  . Ataxia   . Brain aneurysm 01/20/2015  . TOBACCO ABUSE 01/04/2010  . BRONCHITIS, CHRONIC 01/04/2010  . CERVICAL CANCER 01/03/2010  . HYPERLIPIDEMIA 01/03/2010  . DEPRESSION 01/03/2010  . SUBARACHNOID HEMORRHAGE 01/03/2010  . [name] 01/03/2010    Ailene Ravel, OTR/L,CBIS  [phone]  06/25/2015, 2:23 PM  [facility] [address] [place], [place], [address] Phone: [phone]   Fax:  [phone]

## 2015-06-28 ENCOUNTER — Encounter (HOSPITAL_COMMUNITY): Payer: Self-pay

## 2015-06-28 ENCOUNTER — Ambulatory Visit (HOSPITAL_COMMUNITY): Payer: Medicare Other | Admitting: Physical Therapy

## 2015-06-28 ENCOUNTER — Ambulatory Visit (HOSPITAL_COMMUNITY): Payer: Medicare Other

## 2015-06-28 DIAGNOSIS — R262 Difficulty in walking, not elsewhere classified: Secondary | ICD-10-CM

## 2015-06-28 DIAGNOSIS — R269 Unspecified abnormalities of gait and mobility: Secondary | ICD-10-CM

## 2015-06-28 DIAGNOSIS — I6980 Unspecified sequelae of other cerebrovascular disease: Secondary | ICD-10-CM | POA: Diagnosis not present

## 2015-06-28 DIAGNOSIS — R29898 Other symptoms and signs involving the musculoskeletal system: Secondary | ICD-10-CM | POA: Diagnosis not present

## 2015-06-28 DIAGNOSIS — Z7409 Other reduced mobility: Secondary | ICD-10-CM

## 2015-06-28 DIAGNOSIS — R279 Unspecified lack of coordination: Secondary | ICD-10-CM | POA: Diagnosis not present

## 2015-06-28 DIAGNOSIS — R531 Weakness: Secondary | ICD-10-CM

## 2015-06-28 DIAGNOSIS — M6281 Muscle weakness (generalized): Secondary | ICD-10-CM | POA: Diagnosis not present

## 2015-06-28 DIAGNOSIS — M6289 Other specified disorders of muscle: Secondary | ICD-10-CM | POA: Diagnosis not present

## 2015-06-28 NOTE — Therapy (Signed)
[place], [place], [address] Phone: [phone]   Fax:  [phone]  Occupational Therapy Treatment  Patient Details  Name: Diane Hutchinson MRN: [id number] Date of Birth: Mar 23, 1946 Referring Provider:  Charlett Blake, MD  Encounter Date: 06/28/2015      OT End of Session - 06/28/15 1806    Visit Number 9   Number of Visits 18   Date for OT Re-Evaluation 08/01/15  mini reassessment 06/30/2015   Authorization Type Medicare/Medicare A & B   Authorization Time Period Before 10th visit   Authorization - Visit Number 9   Authorization - Number of Visits 10   OT Start Time 1345   OT Stop Time 1430   OT Time Calculation (min) 45 min   Activity Tolerance Patient tolerated treatment well   Behavior During Therapy [facility] for tasks assessed/performed      Past Medical History  Diagnosis Date  . Migraine   . Depression   . Hypertension   . Stroke 2016  . COPD (chronic obstructive pulmonary disease)   . Anxiety     h/o of panic attack  . GERD (gastroesophageal reflux disease)     occas. use of TUMS  . Arthritis     knees    Past Surgical History  Procedure Laterality Date  . [place] surgery  2010  . Cholecystectomy      age 40  . Appendectomy      age 68  . Eye surgery  years ago    laser to both eyes for blocked tear ducts   . Brain surgery  1993    aneurysm  . Tubal ligation  years ago  . Hemorroidectomy  years ago  . Radiology with anesthesia N/A 01/20/2015    Procedure: RADIOLOGY WITH ANESTHESIA;  Surgeon: Luanne Bras, MD;  Location: [facility];  Service: Radiology;  Laterality: N/A;    There were no vitals filed for this visit.  Visit Diagnosis:  Left-sided weakness      Subjective Assessment - 06/28/15 1427    Subjective  S: I want to continue therapy until I'm walking out the door no problem.   Patient is accompained by: Family member   Currently in Pain? No/denies             [facility] OT Assessment - 06/28/15 1808    Assessment   Diagnosis CVA-left sided weakness   Precautions   Precautions Fall                  OT Treatments/Exercises (OP) - 06/28/15 1808    Exercises   Exercises Hand   Hand Exercises   Other Hand Exercises Using black plastic tongs to place sponges into multi colored [place] cyclinders to work on grasp and functional reaching. Patient required increased time and vc's to take rest breaks and slow down. Increased difficulty.    Other Hand Exercises To increase fine motor coordination patient utilized MMRT board requiring her to flip disc over then place in open space above. Increased taxic movement noted even with 1/2 pound wrist weight. Mod difficulty.              Balance Exercises - 06/28/15 1745    Balance Exercises: Seated   Dynamic Sitting Eyes opened;Lower extremity support - 2;No upper extremity support;Anterior/posterior weight shift;Lateral weight shift;Reaching outside base of support  completed sitting on balance disc             OT  Short Term Goals - 06/04/15 1506    OT SHORT TERM GOAL #1   Title Pt will be educated on and independent in HEP.    Time 4   Period Weeks   Status On-going   OT SHORT TERM GOAL #2   Title Pt will increase LUE strength to 4/5 to increase ability to assist in donning shirts.    Time 4   Period Weeks   Status On-going   OT SHORT TERM GOAL #3   Title Patient will increase grip strength by 5# and pinch strength by 3# to increase ability to hold coffee cup.    Time 4   Period Weeks   Status On-going   OT SHORT TERM GOAL #4   Title Patient will increase fine motor coordination by completing 9 hole peg test within 2 minutes.    Time 4   Period Weeks   Status On-going           OT Long Term Goals - 06/04/15 1506    OT LONG TERM GOAL #1   Title Pt will complete all BADL tasks at Mod I level, including dressing, bathing, and meal preparation.    Time 8   Period Weeks    Status On-going   OT LONG TERM GOAL #2   Title Pt will increase LUE strength to 4+/5 to increase ability to complete daily tasks.    Time 8   Period Weeks   Status On-going   OT LONG TERM GOAL #3   Title Patient will increase grip strength by 10# and pinch strength by 5# to increase ability to grasp walker or surfaces during transfers and functional mobility tasks.    Time 8   Period Weeks   Status On-going   OT LONG TERM GOAL #4   Title Pt will increase fine motor coordination by completing 9 hole peg test in under 1 minute.    Time 8   Period Weeks   Status On-going               Plan - 06/28/15 1814    Clinical Impression Statement A: Session with focus functional reaching and grasp activity. Pt had moderate amount of difficulty with controling taxic movement in her left hand. Pt states that she will be seeing her MD tomorrow.   Plan P: Reassess.        Problem List Patient Active Problem List   Diagnosis Date Noted  . Cerebral infarction due to embolism of left carotid artery 03/04/2015  . Aneurysm, cerebral, nonruptured 03/04/2015  . History of ruptured arterial aneurysm 03/04/2015  . Essential hypertension 03/04/2015  . HLD (hyperlipidemia) 03/04/2015  . Alterations of sensations following CVA (cerebrovascular accident)   . Embolic cerebral infarction 01/22/2015  . Left hemiparesis 01/22/2015  . Stroke   . Cerebral embolism with cerebral infarction 01/21/2015  . Ataxia   . Brain aneurysm 01/20/2015  . TOBACCO ABUSE 01/04/2010  . BRONCHITIS, CHRONIC 01/04/2010  . CERVICAL CANCER 01/03/2010  . HYPERLIPIDEMIA 01/03/2010  . DEPRESSION 01/03/2010  . SUBARACHNOID HEMORRHAGE 01/03/2010  . [name] 01/03/2010    Ailene Ravel, OTR/L,CBIS  [phone]  06/28/2015, 6:16 PM  [facility] [address] [place], [place], [address] Phone: [phone]   Fax:  [phone]

## 2015-06-28 NOTE — Therapy (Signed)
[place], [place], [address] Phone: [phone]   Fax:  [phone]  Physical Therapy Treatment  Patient Details  Name: Diane Hutchinson MRN: [id number] Date of Birth: [birth year] Referring Provider:  Charlett Blake, MD  Encounter Date: 06/28/2015      PT End of Session - 06/28/15 1747    Visit Number 17   Number of Visits 18   Date for PT Re-Evaluation 07/03/15   Authorization Type Medicare   Authorization - Visit Number 38   Authorization - Number of Visits 20   PT Start Time 1300   PT Stop Time 1342   PT Time Calculation (min) 42 min   Equipment Utilized During Treatment Gait belt   Activity Tolerance Patient limited by fatigue   Behavior During Therapy [facility] for tasks assessed/performed      Past Medical History  Diagnosis Date  . Migraine   . Depression   . Hypertension   . Stroke 2016  . COPD (chronic obstructive pulmonary disease)   . Anxiety     h/o of panic attack  . GERD (gastroesophageal reflux disease)     occas. use of TUMS  . Arthritis     knees    Past Surgical History  Procedure Laterality Date  . [place] surgery  2010  . Cholecystectomy      age 22  . Appendectomy      age 89  . Eye surgery  years ago    laser to both eyes for blocked tear ducts   . Brain surgery  1993    aneurysm  . Tubal ligation  years ago  . Hemorroidectomy  years ago  . Radiology with anesthesia N/A 01/20/2015    Procedure: RADIOLOGY WITH ANESTHESIA;  Surgeon: Luanne Bras, MD;  Location: [facility];  Service: Radiology;  Laterality: N/A;    There were no vitals filed for this visit.  Visit Diagnosis:  Left-sided weakness  Difficulty walking  Abnormality of gait  Impaired functional mobility and activity tolerance      Subjective Assessment - 06/28/15 1305    Subjective Pt reports that she fell twice over the weekend. Once she fell out of her chair, and once she fell out of the bed  trying to get to the potty chair. She reports that she landed on her L knee and she is having increased pain in the knee today.    Pain Score 5    Pain Location Knee  knee and ankle   Pain Orientation Left               [facility] Adult PT Treatment/Exercise - 06/28/15 0001    Ambulation/Gait   Ambulation/Gait Yes   Ambulation/Gait Assistance 3: Mod assist   Ambulation Distance (Feet) 10 Feet   Assistive device Hemi-walker   Gait Pattern Decreased step length - right;Decreased stance time - left;Decreased weight shift to left   Knee/Hip Exercises: Seated   Long Arc Quad 10 reps   Long Arc Quad Weight 4 lbs.   Knee/Hip Exercises: Supine   Bridges 15 reps   Straight Leg Raises 10 reps;Both   Other Supine Knee/Hip Exercises clams with blue tband x15             Balance Exercises - 06/28/15 1745    Balance Exercises: Seated   Dynamic Sitting Eyes opened;Lower extremity support - 2;No upper extremity support;Anterior/posterior weight shift;Lateral weight shift;Reaching outside base of support  completed sitting on balance  disc             PT Short Term Goals - 06/02/15 1506    PT SHORT TERM GOAL #1   Title Patient will complete functional transfers with min assist in order to decrease burden on caregivers and increase independence with functional mobility.    Baseline Mod assist   Time 3   Period Weeks   Status Achieved  Pt requires supervision-CGA to complete transfers   PT SHORT TERM GOAL #2   Title Pt will complete bed mobility with min assist to allow for increased independence.   Baseline Mod assist   Time 3   Period Weeks   Status Achieved  Pt requires supervision-min A to complete bed mobility depending on level of fatigue   PT SHORT TERM GOAL #3   Title Pt will ambulate 15 feet with RW with min assist in order to improve functional mobility and to allow for pt to ambulate in her home.    Time 3   Period Weeks   Status Achieved   PT SHORT TERM GOAL #4    Title Pt will complete five time sit to stand test in 27 seconds to demonstrate improve BLE strength and functional mobility.    Time 2   Period Weeks   Status New           PT Long Term Goals - 06/02/15 1508    PT LONG TERM GOAL #1   Title Pt will complete functional transfers and bed mobility with supervision to allow for greater independence with self care.    Time 6   Period Weeks   Status Partially Met  Pt requires CGA-min A at times depending on level of fatigue   PT LONG TERM GOAL #2   Title Pt will ambulate 150 feet with RW with supervision to allow for independence with functional mobility and gait.    Time 6   Period Weeks   Status On-going   PT LONG TERM GOAL #3   Title Pt will demonstrate good static standing balance for 10 minutes to demonstrate improved functional activity tolerance and to allow for pt to complete self care.   Time 6   Period Weeks   Status On-going   PT LONG TERM GOAL #4   Title Pt will complete five time sit to stand in 23 seconds to demonstrate improved BLE strength and functional mobliity.    Time 6   Period Weeks   Status New               Plan - 06/28/15 1748    Clinical Impression Statement Pt had 2 falls over the weekend, reporting that she landed on her L knee during one fall. L knee was evaluated, with normal strength, ROM, and no tenderness with palpation of patella, tibiofemoral joint, or surrounding musculature. Seated balance exercises were completed on balance disc today to challenge core stability and proprioception. Pt required min A to maintain seated balance on disc, with cueing for upright posture. Pt had significant difficulty with ambulating today, requiring mod A to prevent LOB when ambulating 10 feet with hemiwalker. Pt is seeing her MD tomorrow, and was advised to speak with him regarding her recent decline in function.    PT Next Visit Plan Continue with seated balance training on uneven surface to increase core  stability        Problem List Patient Active Problem List   Diagnosis Date Noted  . Cerebral infarction due to  embolism of left carotid artery 03/04/2015  . Aneurysm, cerebral, nonruptured 03/04/2015  . History of ruptured arterial aneurysm 03/04/2015  . Essential hypertension 03/04/2015  . HLD (hyperlipidemia) 03/04/2015  . Alterations of sensations following CVA (cerebrovascular accident)   . Embolic cerebral infarction 01/22/2015  . Left hemiparesis 01/22/2015  . Stroke   . Cerebral embolism with cerebral infarction 01/21/2015  . Ataxia   . Brain aneurysm 01/20/2015  . TOBACCO ABUSE 01/04/2010  . BRONCHITIS, CHRONIC 01/04/2010  . CERVICAL CANCER 01/03/2010  . HYPERLIPIDEMIA 01/03/2010  . DEPRESSION 01/03/2010  . SUBARACHNOID HEMORRHAGE 01/03/2010  . [name] 01/03/2010    Hilma Favors, PT, DPT [phone]  06/28/2015, 5:56 PM  [place] [address] [place], [place], [address] Phone: [phone]   Fax:  [phone]

## 2015-06-29 ENCOUNTER — Ambulatory Visit (HOSPITAL_COMMUNITY)
Admission: RE | Admit: 2015-06-29 | Discharge: 2015-06-29 | Disposition: A | Discharge status: Home / Self Care | Payer: Medicare Other | Source: Ambulatory Visit | Attending: Interventional Radiology | Admitting: Interventional Radiology

## 2015-06-29 DIAGNOSIS — D2310 Other benign neoplasm of skin of unspecified eyelid, including canthus: Secondary | ICD-10-CM | POA: Diagnosis not present

## 2015-06-29 DIAGNOSIS — I671 Cerebral aneurysm, nonruptured: Secondary | ICD-10-CM | POA: Diagnosis not present

## 2015-06-29 DIAGNOSIS — R208 Other disturbances of skin sensation: Secondary | ICD-10-CM | POA: Diagnosis not present

## 2015-06-29 LAB — PLATELET INHIBITION P2Y12: PLATELET FUNCTION P2Y12: 88 [PRU] — AB (ref 194–418)

## 2015-06-30 ENCOUNTER — Encounter (HOSPITAL_COMMUNITY): Payer: Self-pay | Admitting: Occupational Therapy

## 2015-06-30 ENCOUNTER — Ambulatory Visit (HOSPITAL_COMMUNITY): Payer: Medicare Other | Admitting: Occupational Therapy

## 2015-06-30 ENCOUNTER — Ambulatory Visit (HOSPITAL_COMMUNITY): Payer: Medicare Other | Admitting: Physical Therapy

## 2015-06-30 DIAGNOSIS — Z789 Other specified health status: Secondary | ICD-10-CM

## 2015-06-30 DIAGNOSIS — IMO0002 Reserved for concepts with insufficient information to code with codable children: Secondary | ICD-10-CM

## 2015-06-30 DIAGNOSIS — R262 Difficulty in walking, not elsewhere classified: Secondary | ICD-10-CM

## 2015-06-30 DIAGNOSIS — Z7409 Other reduced mobility: Secondary | ICD-10-CM

## 2015-06-30 DIAGNOSIS — M6289 Other specified disorders of muscle: Secondary | ICD-10-CM | POA: Diagnosis not present

## 2015-06-30 DIAGNOSIS — G8194 Hemiplegia, unspecified affecting left nondominant side: Secondary | ICD-10-CM

## 2015-06-30 DIAGNOSIS — M6281 Muscle weakness (generalized): Secondary | ICD-10-CM | POA: Diagnosis not present

## 2015-06-30 DIAGNOSIS — I6980 Unspecified sequelae of other cerebrovascular disease: Secondary | ICD-10-CM | POA: Diagnosis not present

## 2015-06-30 DIAGNOSIS — R279 Unspecified lack of coordination: Secondary | ICD-10-CM | POA: Diagnosis not present

## 2015-06-30 DIAGNOSIS — R269 Unspecified abnormalities of gait and mobility: Secondary | ICD-10-CM

## 2015-06-30 DIAGNOSIS — R531 Weakness: Secondary | ICD-10-CM

## 2015-06-30 DIAGNOSIS — R29898 Other symptoms and signs involving the musculoskeletal system: Secondary | ICD-10-CM | POA: Diagnosis not present

## 2015-06-30 NOTE — Therapy (Addendum)
[place], [place], [address] Phone: [phone]   Fax:  [phone]  Occupational Therapy Reassessment & Treatment  Patient Details  Name: FAYTH TREFRY MRN: [id number] Date of Birth: 1946/09/14 Referring Provider:  Charlett Blake, MD  Encounter Date: 06/30/2015      OT End of Session - 06/30/15 1605    Visit Number 10   Number of Visits 18   Date for OT Re-Evaluation 08/01/15   Authorization Type Medicare/Medicare A & B   Authorization Time Period Before 20th visit   Authorization - Visit Number 10   Authorization - Number of Visits 20   OT Start Time 1301   OT Stop Time 1346   OT Time Calculation (min) 45 min   Activity Tolerance Patient tolerated treatment well   Behavior During Therapy [facility] for tasks assessed/performed      Past Medical History  Diagnosis Date  . Migraine   . Depression   . Hypertension   . Stroke 2016  . COPD (chronic obstructive pulmonary disease)   . Anxiety     h/o of panic attack  . GERD (gastroesophageal reflux disease)     occas. use of TUMS  . Arthritis     knees    Past Surgical History  Procedure Laterality Date  . [place] surgery  2010  . Cholecystectomy      age 69  . Appendectomy      age 69  . Eye surgery  years ago    laser to both eyes for blocked tear ducts   . Brain surgery  1993    aneurysm  . Tubal ligation  years ago  . Hemorroidectomy  years ago  . Radiology with anesthesia N/A 01/20/2015    Procedure: RADIOLOGY WITH ANESTHESIA;  Surgeon: Luanne Bras, MD;  Location: [place];  Service: Radiology;  Laterality: N/A;    There were no vitals filed for this visit.  Visit Diagnosis:  Left-sided weakness  Lack of coordination due to stroke  Decreased grip strength of left hand  Decreased pinch strength  Decreased activities of daily living (ADL)      Subjective Assessment - 06/30/15 1258    Subjective  S: I don't understand why  that hand won't do right.    Currently in Pain? No/denies           [facility] OT Assessment - 06/30/15 1257    Assessment   Diagnosis CVA-left sided weakness   Precautions   Precautions Fall   ADL   Eating/Feeding Modified independent   Grooming Modified independent   Lower Body Bathing Maximal assistance   Upper Body Dressing Moderate assistance   Lower Body Dressing Moderate assistance  pt able to donn pants; husband assists with socks/shoes   Toilet Tranfer Modified independent  w/c to bedside commode   Toileting - Clothing Manipulation Maximal assistance   Toileting -  Hygiene Modified Independent   Tub/Shower Transfer Maximal assistance  pt has transferred to shower 1x; sponge bathes   Archivist seat without back   Coordination   Left 9 Hole Peg Test unable to complete  placed 3 pegs in 4'30"   AROM   Overall AROM  Within functional limits for tasks performed  LUE movements are ataxic   Strength   Right/Left Shoulder Left   Left Shoulder Flexion 4-/5  previous 3+/5   Left Shoulder ABduction 4-/5  previous 3+/5   Left Shoulder Internal Rotation 4-/5  previous 3+/5   Left Shoulder External Rotation 4-/5  previous 3+/5   Right/Left Elbow Left   Left Elbow Flexion 4/5  previous 4-/5   Left Elbow Extension 4/5  previous 4-/5   Right Hand Gross Grasp Functional   Left Hand Gross Grasp Functional   Left Hand Grip (lbs) 20  previous 24   Left Hand Lateral Pinch 6 lbs  previous 5   Left Hand 3 Point Pinch 3 lbs  same as previous                   OT Treatments/Exercises (OP) - 06/30/15 1321    Exercises   Exercises Hand   Hand Exercises   Hand Gripper with Large Beads 6/6 beads gripper set at 11#   Hand Gripper with Medium Beads 13/13 beads with gripper set at 7#   Hand Gripper with Small Beads 12/12 beads with gripper set at 7#                 OT Short Term Goals - 06/30/15 1612    OT SHORT TERM GOAL #1    Title Pt will be educated on and independent in HEP.    Time 4   Period Weeks   Status On-going   OT SHORT TERM GOAL #2   Title Pt will increase LUE strength to 4/5 to increase ability to assist in donning shirts.    Time 4   Period Weeks   Status On-going   OT SHORT TERM GOAL #3   Title Patient will increase grip strength by 5# and pinch strength by 3# to increase ability to hold coffee cup.    Time 4   Period Weeks   Status On-going   OT SHORT TERM GOAL #4   Title Patient will increase fine motor coordination by completing 9 hole peg test within 2 minutes.    Time 4   Period Weeks   Status On-going           OT Long Term Goals - 06/04/15 1506    OT LONG TERM GOAL #1   Title Pt will complete all BADL tasks at Mod I level, including dressing, bathing, and meal preparation.    Time 8   Period Weeks   Status On-going   OT LONG TERM GOAL #2   Title Pt will increase LUE strength to 4+/5 to increase ability to complete daily tasks.    Time 8   Period Weeks   Status On-going   OT LONG TERM GOAL #3   Title Patient will increase grip strength by 10# and pinch strength by 5# to increase ability to grasp walker or surfaces during transfers and functional mobility tasks.    Time 8   Period Weeks   Status On-going   OT LONG TERM GOAL #4   Title Pt will increase fine motor coordination by completing 9 hole peg test in under 1 minute.    Time 8   Period Weeks   Status On-going               Plan - 06/30/15 1611    Clinical Impression Statement A: Mini-reassessment completed this session. Pt demonstrates increase in LUE shoulder and elbow strength, and slight increase in LUE coordination. Pt has not met any therapy goals thus far. Pt continues to require verbal cuing to use vision to determine where LUE is and what it is doing at all times. Pt reports she talked to MD who is  going to send her for more diagnostic testing to determine why her arm will not function correctly.     Plan P: Continue therapy for 4 additional weeks, if pt is not progressing towards goals she will be discharged at this time. Continue working toward increasing [place], [place], grip and pinch strength, and general LUE strength. Address dressing tasks and tub transfer using transfer bench.           G-Codes - 07-07-2015 1618    Functional Assessment Tool Used FOTO Score: 22/100 (78% impairment)   Functional Limitation Self care   Self Care Current Status (O1753) At least 60 percent but less than 80 percent impaired, limited or restricted   Self Care Goal Status (M1040) At least 20 percent but less than 40 percent impaired, limited or restricted      Problem List Patient Active Problem List   Diagnosis Date Noted  . Cerebral infarction due to embolism of left carotid artery 03/04/2015  . Aneurysm, cerebral, nonruptured 03/04/2015  . History of ruptured arterial aneurysm 03/04/2015  . Essential hypertension 03/04/2015  . HLD (hyperlipidemia) 03/04/2015  . Alterations of sensations following CVA (cerebrovascular accident)   . Embolic cerebral infarction 01/22/2015  . Left hemiparesis 01/22/2015  . Stroke   . Cerebral embolism with cerebral infarction 01/21/2015  . Ataxia   . Brain aneurysm 01/20/2015  . TOBACCO ABUSE 01/04/2010  . BRONCHITIS, CHRONIC 01/04/2010  . CERVICAL CANCER 01/03/2010  . HYPERLIPIDEMIA 01/03/2010  . DEPRESSION 01/03/2010  . SUBARACHNOID HEMORRHAGE 01/03/2010  . [name] 01/03/2010    Guadelupe Sabin, OTR/L  [phone]  July 07, 2015, 4:21 PM  [place] [address] [place], [place], [address] Phone: [phone]   Fax:  [phone]    Occupational Therapy Progress Note  Dates of Reporting Period: 06/02/2015 to 07-07-2015  Objective Reports of Subjective Statement: Pt continues to demonstrate ataxic movements in LUE, as well as difficulty completing B/IADL tasks due to decreased strength and coordination of LUE.    Objective Measurements: See reassessment values above.   Goal Update: Pt is making slow progress towards goals, however has not met any occupational therapy goals thus far. Pt has increased LUE shoulder & elbow strength, and demonstrates slight improvement during [facility] tasks.   Plan: Continue to provide skilled occupational therapy services for 4 additional weeks, including addressing LUE general strengthening, grip/pinch strengthening, LUE fine and gross motor coordination, as well as BADL tasks.   Reason Skilled Services are Required: Pt continues to be limited in her ability to perform B/IADL tasks due to above deficits. Pt would continue to benefit from skilled services to increase ability to perform BADL tasks with as much independence as possible and to decrease caregiver burden in the home.

## 2015-06-30 NOTE — Therapy (Signed)
[place] [address] [place], [place], [address] Phone: [phone]   Fax:  [phone]  Physical Therapy Treatment  Patient Details  Name: Diane Hutchinson MRN: [id number] Date of Birth: 08-Nov-1946 Referring Provider:  Charlett Blake, MD  Encounter Date: 06/30/2015      PT End of Session - 06/30/15 1732    Visit Number 18   Number of Visits 26   Date for PT Re-Evaluation 07/28/15   Authorization Type Medicare   Authorization Time Period recert done 7/58/83   Authorization - Visit Number 18   Authorization - Number of Visits 28   PT Start Time 2549   PT Stop Time 1430   PT Time Calculation (min) 45 min   Equipment Utilized During Treatment Gait belt   Activity Tolerance Patient tolerated treatment well   Behavior During Therapy [facility] for tasks assessed/performed      Past Medical History  Diagnosis Date  . Migraine   . Depression   . Hypertension   . Stroke 2016  . COPD (chronic obstructive pulmonary disease)   . Anxiety     h/o of panic attack  . GERD (gastroesophageal reflux disease)     occas. use of TUMS  . Arthritis     knees    Past Surgical History  Procedure Laterality Date  . [place] surgery  2010  . Cholecystectomy      age 79  . Appendectomy      age 62  . Eye surgery  years ago    laser to both eyes for blocked tear ducts   . Brain surgery  1993    aneurysm  . Tubal ligation  years ago  . Hemorroidectomy  years ago  . Radiology with anesthesia N/A 01/20/2015    Procedure: RADIOLOGY WITH ANESTHESIA;  Surgeon: Luanne Bras, MD;  Location: [facility];  Service: Radiology;  Laterality: N/A;    There were no vitals filed for this visit.  Visit Diagnosis:  Difficulty walking  Abnormality of gait  Impaired functional mobility and activity tolerance  Hemiparesis, left      Subjective Assessment - 06/30/15 1349    Subjective Pt and husband report that she returned to her MD, who wants to  operate on her aneurysm again. MD wants pt to see a stroke specialist to determine the reason that her arm and leg are still not working as well as they should be before he operates. Over the past month, they have noticed improvements in her walking and her mobility getting in and out of chair/bed, but for the past week, she has been having increased difficulty with walking and mobility. Pt has noticed improvements in her balance overall, but again, has noticed greater impairment in the past week.    How long can you stand comfortably? 15-20 minutes   How long can you walk comfortably? 5-15 minutes   Currently in Pain? No/denies   Pain Score 0-No pain            [facility] PT Assessment - 06/30/15 1356    Strength   Right Hip Flexion 4+/5   Right Hip Extension 3-/5   Right Hip ABduction 4-/5   Left Hip Flexion 4-/5   Left Hip Extension 2/5   Left Hip ABduction 2/5   Right Knee Flexion 4+/5   Right Knee Extension 4+/5   Left Knee Flexion 3/5   Left Knee Extension 4/5   Transfers   Sit to Stand --  varies depending on level of fatigue   Five time sit to stand comments  44.35 seconds with UE support and min A.    Transfers Stand Pivot Transfers   Stand Pivot Transfer Details (indicate cue type and reason) min-max A required depending on level of fatigue                     [facility] Adult PT Treatment/Exercise - 06/30/15 1356    Ambulation/Gait   Ambulation/Gait Yes   Ambulation/Gait Assistance 4: Min assist   Ambulation Distance (Feet) 75 Feet  x3   Assistive device Hemi-walker   Gait Pattern Decreased step length - right;Decreased stance time - left;Decreased weight shift to left   Knee/Hip Exercises: Seated   Sit to Sand 5 reps                  PT Short Term Goals - 06/30/15 1404    PT SHORT TERM GOAL #1   Title Patient will complete functional transfers with min assist in order to decrease burden on caregivers and increase independence with functional  mobility.    Baseline Varies from min-max assist depending on level of fatigue   Time 3   Period Weeks   Status On-going   PT SHORT TERM GOAL #2   Title Pt will complete bed mobility with min assist to allow for increased independence.   Baseline Supervision   Time 3   Period Weeks   Status Achieved   PT SHORT TERM GOAL #3   Title Pt will ambulate 15 feet with RW with min assist in order to improve functional mobility and to allow for pt to ambulate in her home.    Time 3   Period Weeks   Status Achieved   PT SHORT TERM GOAL #4   Title Pt will complete five time sit to stand test in 27 seconds to demonstrate improve BLE strength and functional mobility.    Time 2   Period Weeks   Status On-going           PT Long Term Goals - 06/30/15 1740    PT LONG TERM GOAL #1   Title Pt will complete functional transfers and bed mobility with supervision to allow for greater independence with self care.    Time 6   Period Weeks   Status On-going   PT LONG TERM GOAL #2   Title Pt will ambulate 150 feet with RW with supervision to allow for independence with functional mobility and gait.    Time 6   Period Weeks   Status On-going   PT LONG TERM GOAL #3   Title Pt will demonstrate good static standing balance for 10 minutes to demonstrate improved functional activity tolerance and to allow for pt to complete self care.   Time 6   Period Weeks   Status On-going   PT LONG TERM GOAL #4   Title Pt will complete five time sit to stand in 23 seconds to demonstrate improved BLE strength and functional mobliity.    Time 6   Period Weeks   Status On-going               Plan - 06/30/15 1733    Clinical Impression Statement Reassessment was completed on pt today. For her previous 3 PT sessions, pt was demonstrating decline in function, able to ambulate only short distances at a time with mod A, showing increased lethargy, and was having difficulty following commands. In  today's  session, pt demonstrated improved function, able to ambulate 75' x 3, able to transfer with CGA/min A, and was more alert. PT has discussed concerns regarding fluctuations in performance with pt and her husband, who are both frustrated with pt's recent decline in function. They report that for the past 2 days, pt has been returning to functional level that she was at 2 weeks ago, being able to get in and out of her chair/bed with less assistance from husband. Pt will benefit from continued skilled services to continue to address her impairements in balance, gait, and functional activity tolerance, recommending 2x per week x 4 more weeks. If pt continues to demonstrate decline in function or lack of progress over this time, d/c plan will need to be discussed with pt and husband.    Pt will benefit from skilled therapeutic intervention in order to improve on the following deficits Abnormal gait;Decreased activity tolerance;Decreased balance;Decreased coordination;Decreased mobility;Decreased strength;Difficulty walking;Pain   Rehab Potential Good   PT Frequency 2x / week   PT Duration 4 weeks   PT Treatment/Interventions ADLs/Self Care Home Management;Gait training;Functional mobility training;Therapeutic activities;Therapeutic exercise;Balance training;Neuromuscular re-education;Patient/family education;Manual techniques   PT Next Visit Plan Continue with functional strengthening, transfer training, gait training   Consulted and Agree with Plan of Care Patient;Family member/caregiver          G-Codes - 14-Jul-2015 1741    Functional Assessment Tool Used five time sit to stand, gait, MMT, clinical judgement   Functional Limitation Mobility: Walking and moving around   Mobility: Walking and Moving Around Current Status [phone]) At least 40 percent but less than 60 percent impaired, limited or restricted   Mobility: Walking and Moving Around Goal Status [phone]) At least 40 percent but less than 60 percent  impaired, limited or restricted      Problem List Patient Active Problem List   Diagnosis Date Noted  . Cerebral infarction due to embolism of left carotid artery 03/04/2015  . Aneurysm, cerebral, nonruptured 03/04/2015  . History of ruptured arterial aneurysm 03/04/2015  . Essential hypertension 03/04/2015  . HLD (hyperlipidemia) 03/04/2015  . Alterations of sensations following CVA (cerebrovascular accident)   . Embolic cerebral infarction 01/22/2015  . Left hemiparesis 01/22/2015  . Stroke   . Cerebral embolism with cerebral infarction 01/21/2015  . Ataxia   . Brain aneurysm 01/20/2015  . TOBACCO ABUSE 01/04/2010  . BRONCHITIS, CHRONIC 01/04/2010  . CERVICAL CANCER 01/03/2010  . HYPERLIPIDEMIA 01/03/2010  . DEPRESSION 01/03/2010  . SUBARACHNOID HEMORRHAGE 01/03/2010  . [name] 01/03/2010    Hilma Favors, PT, DPT [phone] 2015-07-14, 5:43 PM  [place] [address] [place], [place], [address] Phone: [phone]   Fax:  [phone]

## 2015-07-01 IMAGING — CT CT ANGIO HEAD
1 of 9 series · 11 of 47 positions shown · IV contrast (80CC OMNI 350)
Comparison: 02/03/2014

CLINICAL DATA: Vertigo for 1 week. Headaches. History of multiple
cerebral aneurysms status post coiling and clipping.

EXAM:
CT ANGIOGRAPHY HEAD
TECHNIQUE: Multidetector CT imaging of the head was performed using the
standard protocol during bolus administration of intravenous
contrast. Multiplanar CT image reconstructions and MIPs were
obtained to evaluate the vascular anatomy.
CONTRAST:  80mL OMNIPAQUE IOHEXOL 350 MG/ML SOLN

[Series 500: axial thin · axial · 0.49mm/px · z∈[+140,+279]mm · 11 of 167 slices shown]
[im 14/167  brain]
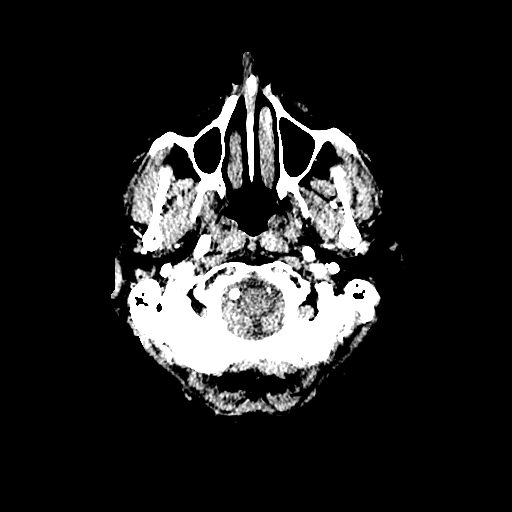
[im 28/167  bone]
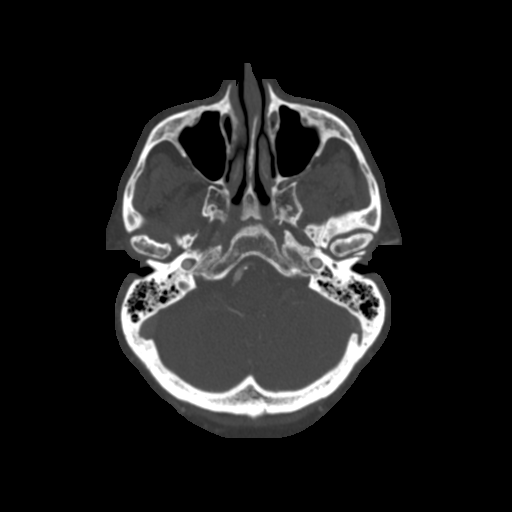
[im 42/167  brain]
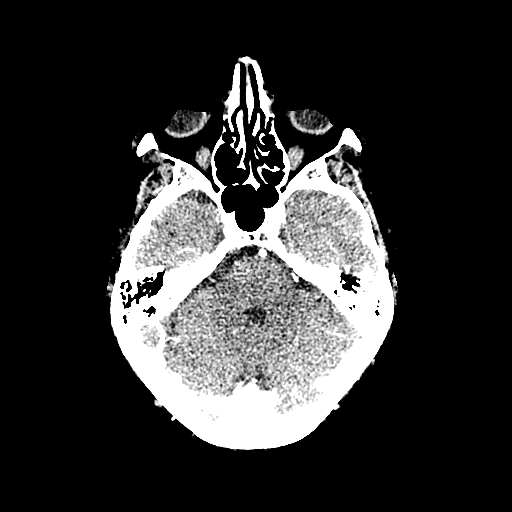
[im 56/167  bone]
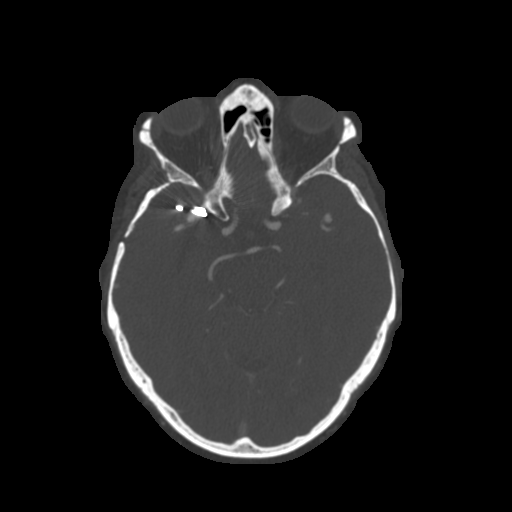
[im 70/167  brain]
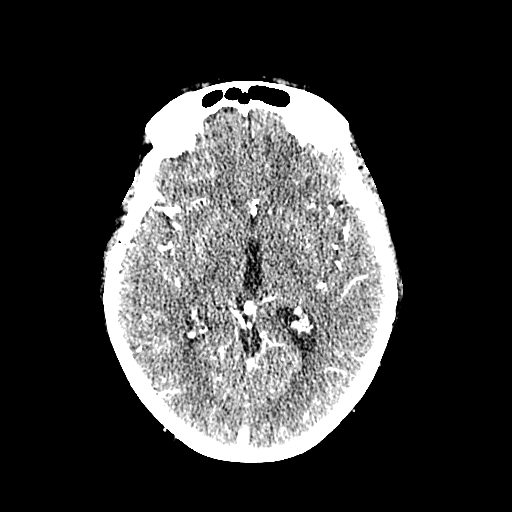
[im 84/167  bone]
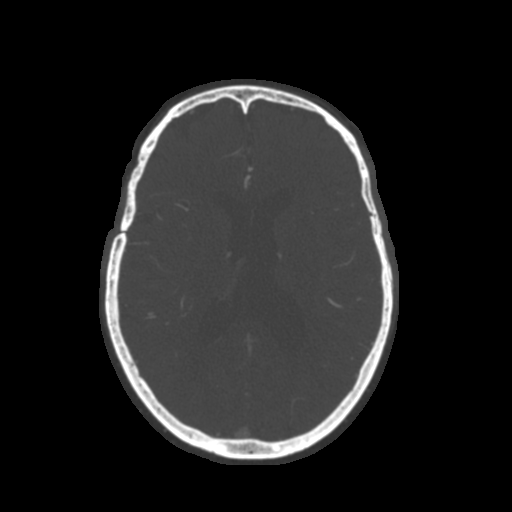
[im 97/167  brain]
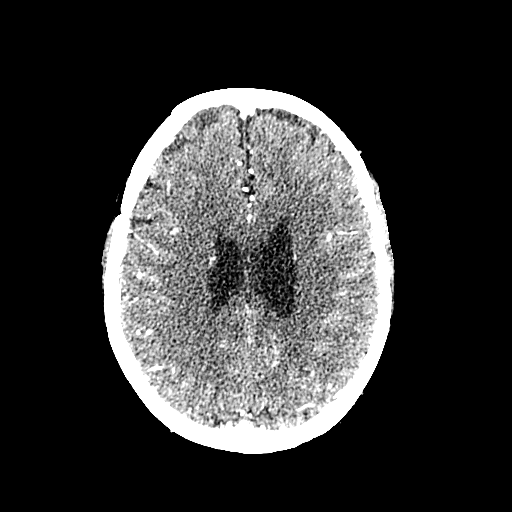
[im 111/167  bone]
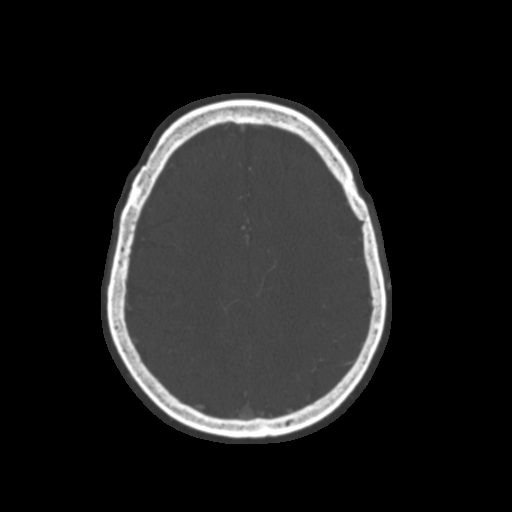
[im 125/167  brain]
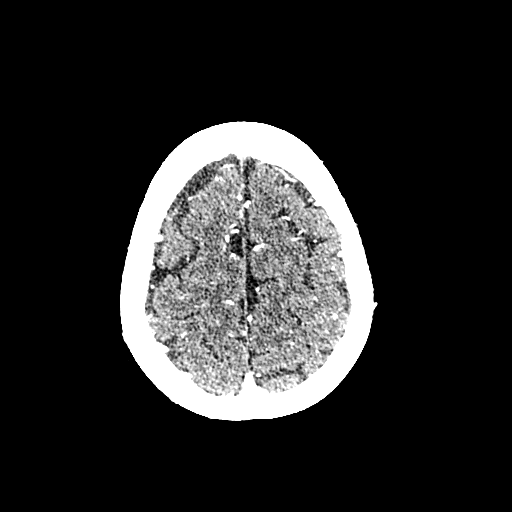
[im 139/167  bone]
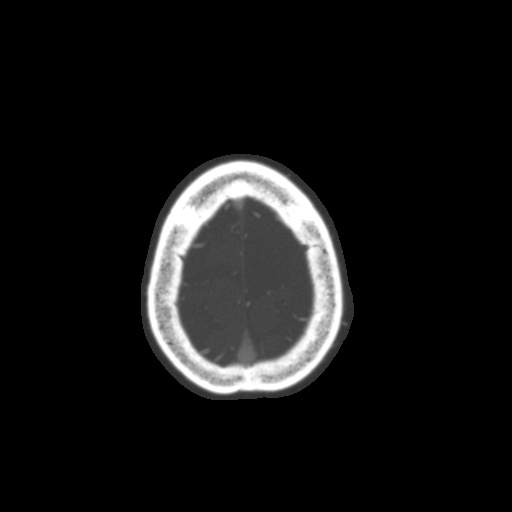
[im 153/167  brain]
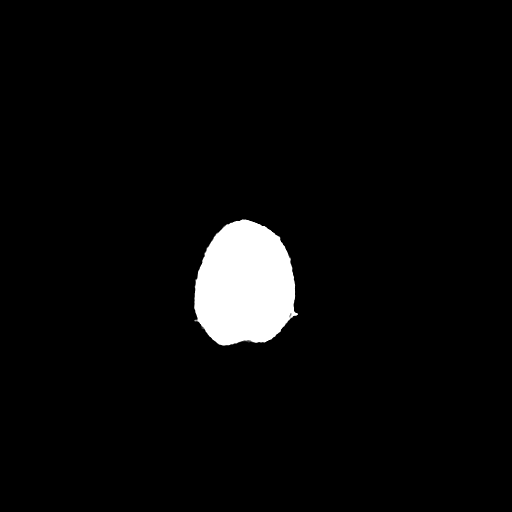

[11 of 47 positions shown; findings below may reference images not displayed]

FINDINGS: CT HEAD

Brain: Periventricular white matter hypodensities are similar to the
prior study and nonspecific but compatible with mild chronic small
vessel ischemic disease. Ventricles and sulci are within normal
limits for age. There is no evidence of acute cortical infarct,
intracranial hemorrhage, mass, midline shift, or extra-axial fluid
collection. Streak artifact is noted from right PICA aneurysm
coiling and right MCA bifurcation region aneurysm clipping.

Calvarium and skull base: Prior right pterional craniotomy.

Paranasal sinuses: Mastoid air cells and visualized paranasal
sinuses are clear.

Orbits: Unremarkable.

CTA HEAD

Anterior circulation: Internal carotid arteries are patent from
skullbase to carotid termini. There is mild right carotid siphon
calcification without stenosis. The M1 segments are patent without
stenosis. Prior right MCA bifurcation aneurysm clipping is again
noted. No definite residual/recurrent aneurysm is identified
allowing for associated streak artifact in this region. 4.5 x 4.5 mm
inferiorly directed aneurysm at the left MCA bifurcation is
unchanged. The right A1 segment is mildly dominant. ACAs are
otherwise unremarkable. No new intracranial aneurysm is identified.

Posterior circulation: The visualized distal vertebral arteries are
patent without stenosis. Right vertebral artery is dominant and
supplies the basilar. The left vertebral artery is hypoplastic and
ends in PICA. Prior right PICA aneurysm coiling is again noted
without definite evidence of residual/recurrent aneurysm, although
evaluation is partially limited by associated streak artifact. Right
PICA is patent. Basilar artery is patent without stenosis. SCA
origins are patent. PCAs are unremarkable. Posterior communicating
arteries are not identified.

Venous sinuses: Patent.

Anatomic variants: None of significance.

Delayed phase:No abnormal enhancement.
IMPRESSION: 1. Prior right MCA bifurcation aneurysm clipping and prior right
PICA aneurysm coiling without evidence of residual/recurrent
aneurysm.
2. Unchanged 4.5 mm left MCA bifurcation aneurysm.
3. No acute intracranial abnormality.

## 2015-07-02 ENCOUNTER — Encounter (HOSPITAL_COMMUNITY): Payer: Self-pay | Admitting: Occupational Therapy

## 2015-07-02 ENCOUNTER — Ambulatory Visit (HOSPITAL_COMMUNITY): Payer: Medicare Other

## 2015-07-02 ENCOUNTER — Ambulatory Visit (HOSPITAL_COMMUNITY): Payer: Medicare Other | Admitting: Occupational Therapy

## 2015-07-02 DIAGNOSIS — R269 Unspecified abnormalities of gait and mobility: Secondary | ICD-10-CM

## 2015-07-02 DIAGNOSIS — R29898 Other symptoms and signs involving the musculoskeletal system: Secondary | ICD-10-CM | POA: Diagnosis not present

## 2015-07-02 DIAGNOSIS — R531 Weakness: Secondary | ICD-10-CM

## 2015-07-02 DIAGNOSIS — I6980 Unspecified sequelae of other cerebrovascular disease: Secondary | ICD-10-CM | POA: Diagnosis not present

## 2015-07-02 DIAGNOSIS — M6289 Other specified disorders of muscle: Secondary | ICD-10-CM | POA: Diagnosis not present

## 2015-07-02 DIAGNOSIS — G8194 Hemiplegia, unspecified affecting left nondominant side: Secondary | ICD-10-CM

## 2015-07-02 DIAGNOSIS — R262 Difficulty in walking, not elsewhere classified: Secondary | ICD-10-CM

## 2015-07-02 DIAGNOSIS — IMO0002 Reserved for concepts with insufficient information to code with codable children: Secondary | ICD-10-CM

## 2015-07-02 DIAGNOSIS — R279 Unspecified lack of coordination: Secondary | ICD-10-CM | POA: Diagnosis not present

## 2015-07-02 DIAGNOSIS — Z7409 Other reduced mobility: Secondary | ICD-10-CM

## 2015-07-02 DIAGNOSIS — Z789 Other specified health status: Secondary | ICD-10-CM

## 2015-07-02 DIAGNOSIS — M6281 Muscle weakness (generalized): Secondary | ICD-10-CM | POA: Diagnosis not present

## 2015-07-02 NOTE — Therapy (Signed)
[place], [place], [address] Phone: [phone]   Fax:  [phone]  Occupational Therapy Treatment  Patient Details  Name: Diane Hutchinson MRN: [id number] Date of Birth: 06/13/1946 Referring Provider:  Charlett Blake, MD  Encounter Date: 07/02/2015      OT End of Session - 07/02/15 1616    Visit Number 11   Number of Visits 18   Date for OT Re-Evaluation 08/01/15   Authorization Type Medicare/Medicare A & B   Authorization Time Period Before 20th visit   Authorization - Visit Number 11   Authorization - Number of Visits 20   OT Start Time 1301   OT Stop Time 1345   OT Time Calculation (min) 44 min   Activity Tolerance Patient tolerated treatment well   Behavior During Therapy [facility] for tasks assessed/performed      Past Medical History  Diagnosis Date  . Migraine   . Depression   . Hypertension   . Stroke 2016  . COPD (chronic obstructive pulmonary disease)   . Anxiety     h/o of panic attack  . GERD (gastroesophageal reflux disease)     occas. use of TUMS  . Arthritis     knees    Past Surgical History  Procedure Laterality Date  . [place] surgery  2010  . Cholecystectomy      age 54  . Appendectomy      age 52  . Eye surgery  years ago    laser to both eyes for blocked tear ducts   . Brain surgery  1993    aneurysm  . Tubal ligation  years ago  . Hemorroidectomy  years ago  . Radiology with anesthesia N/A 01/20/2015    Procedure: RADIOLOGY WITH ANESTHESIA;  Surgeon: Luanne Bras, MD;  Location: [place];  Service: Radiology;  Laterality: N/A;    There were no vitals filed for this visit.  Visit Diagnosis:  Left-sided weakness  Lack of coordination due to stroke  Decreased grip strength of left hand  Decreased pinch strength  Decreased activities of daily living (ADL)      Subjective Assessment - 07/02/15 1257    Subjective  S: I fell again last night trying to get  from my wheelchair to the potty chair.    Currently in Pain? No/denies            [facility] OT Assessment - 07/02/15 1256    Assessment   Diagnosis CVA-left sided weakness   Precautions   Precautions Fall                  OT Treatments/Exercises (OP) - 07/02/15 1305    Exercises   Exercises Hand   Shoulder Exercises: Seated   Protraction Strengthening;15 reps   Protraction Weight (lbs) 1   Flexion Strengthening;15 reps   Flexion Weight (lbs) 1   Other Seated Exercises Shoulder press, 15X, 1#   Elbow Exercises   Other elbow exercises elbow flexion/extension strengthening, 15X, 1#   Hand Exercises   Sponges 10, 10    Large Pegboard Pt placed large multi-colored pegs into large blue pegboard according to a pattern using LUE. Pt had mod difficulty grasping pegs from box and manipulating in left hand. Pt used right hand to place pegs into left hand approximately 10% of the time, otherwise was able to manipulate pegs into various pinch positions to place pegs. Pt able to complete pattern and place 40/40 pegs with  extended time.    Fine Motor Coordination Large Pegboard                  OT Short Term Goals - 06/30/15 1612    OT SHORT TERM GOAL #1   Title Pt will be educated on and independent in HEP.    Time 4   Period Weeks   Status On-going   OT SHORT TERM GOAL #2   Title Pt will increase LUE strength to 4/5 to increase ability to assist in donning shirts.    Time 4   Period Weeks   Status On-going   OT SHORT TERM GOAL #3   Title Patient will increase grip strength by 5# and pinch strength by 3# to increase ability to hold coffee cup.    Time 4   Period Weeks   Status On-going   OT SHORT TERM GOAL #4   Title Patient will increase fine motor coordination by completing 9 hole peg test within 2 minutes.    Time 4   Period Weeks   Status On-going           OT Long Term Goals - 06/04/15 1506    OT LONG TERM GOAL #1   Title Pt will complete all BADL  tasks at Mod I level, including dressing, bathing, and meal preparation.    Time 8   Period Weeks   Status On-going   OT LONG TERM GOAL #2   Title Pt will increase LUE strength to 4+/5 to increase ability to complete daily tasks.    Time 8   Period Weeks   Status On-going   OT LONG TERM GOAL #3   Title Patient will increase grip strength by 10# and pinch strength by 5# to increase ability to grasp walker or surfaces during transfers and functional mobility tasks.    Time 8   Period Weeks   Status On-going   OT LONG TERM GOAL #4   Title Pt will increase fine motor coordination by completing 9 hole peg test in under 1 minute.    Time 8   Period Weeks   Status On-going               Plan - 07/02/15 1616    Clinical Impression Statement A: Pt had GREAT session today. Pt completed large pegboard activity, placing 40 pegs using left hand with mod difficulty. Pt was only able to place 10 pegs when previously attempting this activity. Pt demonstrates increased control of LUE during strengthening exercises, min verbal cuing for form. Pt felt great sense of accomplishment at end of session.    Plan P: Continue working on fine motor coordination, grip and pinch strength & control. Functional reaching task.         Problem List Patient Active Problem List   Diagnosis Date Noted  . Cerebral infarction due to embolism of left carotid artery 03/04/2015  . Aneurysm, cerebral, nonruptured 03/04/2015  . History of ruptured arterial aneurysm 03/04/2015  . Essential hypertension 03/04/2015  . HLD (hyperlipidemia) 03/04/2015  . Alterations of sensations following CVA (cerebrovascular accident)   . Embolic cerebral infarction 01/22/2015  . Left hemiparesis 01/22/2015  . Stroke   . Cerebral embolism with cerebral infarction 01/21/2015  . Ataxia   . Brain aneurysm 01/20/2015  . TOBACCO ABUSE 01/04/2010  . BRONCHITIS, CHRONIC 01/04/2010  . CERVICAL CANCER 01/03/2010  . HYPERLIPIDEMIA  01/03/2010  . DEPRESSION 01/03/2010  . SUBARACHNOID HEMORRHAGE 01/03/2010  . C O P  D 01/03/2010    Guadelupe Sabin, OTR/L  [phone]  07/02/2015, 4:19 PM  [place] [address] [place], [place], [address] Phone: [phone]   Fax:  [phone]

## 2015-07-02 NOTE — Therapy (Signed)
[place], [place], [address] Phone: [phone]   Fax:  [phone]  Physical Therapy Treatment  Patient Details  Name: Diane Hutchinson MRN: [id number] Date of Birth: 11-20-1945 Referring Provider:  Charlett Blake, MD  Encounter Date: 07/02/2015      PT End of Session - 07/02/15 1539    Visit Number 19   Number of Visits 26   Date for PT Re-Evaluation 07/28/15   Authorization Type Medicare   Authorization Time Period recert done 7/51/02   Authorization - Visit Number 61   Authorization - Number of Visits 28   PT Start Time 1350   PT Stop Time 1440   PT Time Calculation (min) 50 min   Equipment Utilized During Treatment Gait belt   Activity Tolerance Patient tolerated treatment well   Behavior During Therapy [facility] for tasks assessed/performed      Past Medical History  Diagnosis Date  . Migraine   . Depression   . Hypertension   . Stroke 2016  . COPD (chronic obstructive pulmonary disease)   . Anxiety     h/o of panic attack  . GERD (gastroesophageal reflux disease)     occas. use of TUMS  . Arthritis     knees    Past Surgical History  Procedure Laterality Date  . [place] surgery  2010  . Cholecystectomy      age 69  . Appendectomy      age 69  . Eye surgery  years ago    laser to both eyes for blocked tear ducts   . Brain surgery  1993    aneurysm  . Tubal ligation  years ago  . Hemorroidectomy  years ago  . Radiology with anesthesia N/A 01/20/2015    Procedure: RADIOLOGY WITH ANESTHESIA;  Surgeon: Luanne Bras, MD;  Location: [place];  Service: Radiology;  Laterality: N/A;    There were no vitals filed for this visit.  Visit Diagnosis:  Difficulty walking  Abnormality of gait  Impaired functional mobility and activity tolerance  Hemiparesis, left  Left-sided weakness  Lack of coordination due to stroke      Subjective Assessment - 07/02/15 1441    Subjective Pt  reported fall yesterday during transfer from [facility] to bedside commade.  No reports of pain today.     Currently in Pain? No/denies                         [facility] Adult PT Treatment/Exercise - 07/02/15 1535    Transfers   Transfers Sit to Stand;Stand Pivot Transfers;Stand to Sit   Sit to Stand 5: Supervision  Cueing for hand placement for safety and assistance   Sit to Stand Details Verbal cues for safe use of DME/AE   Stand to Sit 5: Supervision   Stand to Sit Details (indicate cue type and reason) Verbal cues for safe use of DME/AE   Stand Pivot Transfers 4: Min guard   Number of Reps Other reps (comment)   Transfer Cueing 5reps   Sit to Stand 4: Min assist  10 reps   Ambulation/Gait   Ambulation/Gait Yes   Ambulation Distance (Feet) 200 Feet  100x 2   Assistive device Hemi-walker   Gait Pattern Decreased step length - right;Decreased stance time - left;Decreased weight shift to left   Knee/Hip Exercises: Seated   Other Seated Knee/Hip Exercises Sitting on dynadisc cone rotation   Knee/Hip Exercises:  Supine   Bridges 15 reps   Straight Leg Raises 10 reps;Both   Other Supine Knee/Hip Exercises clams with blue tband x15   Hand Exercises   Sponges 10, 10               PT Short Term Goals - 06/30/15 1404    PT SHORT TERM GOAL #1   Title Patient will complete functional transfers with min assist in order to decrease burden on caregivers and increase independence with functional mobility.    Baseline Varies from min-max assist depending on level of fatigue   Time 3   Period Weeks   Status On-going   PT SHORT TERM GOAL #2   Title Pt will complete bed mobility with min assist to allow for increased independence.   Baseline Supervision   Time 3   Period Weeks   Status Achieved   PT SHORT TERM GOAL #3   Title Pt will ambulate 15 feet with RW with min assist in order to improve functional mobility and to allow for pt to ambulate in her home.    Time 3    Period Weeks   Status Achieved   PT SHORT TERM GOAL #4   Title Pt will complete five time sit to stand test in 27 seconds to demonstrate improve BLE strength and functional mobility.    Time 2   Period Weeks   Status On-going           PT Long Term Goals - 06/30/15 1740    PT LONG TERM GOAL #1   Title Pt will complete functional transfers and bed mobility with supervision to allow for greater independence with self care.    Time 6   Period Weeks   Status On-going   PT LONG TERM GOAL #2   Title Pt will ambulate 150 feet with RW with supervision to allow for independence with functional mobility and gait.    Time 6   Period Weeks   Status On-going   PT LONG TERM GOAL #3   Title Pt will demonstrate good static standing balance for 10 minutes to demonstrate improved functional activity tolerance and to allow for pt to complete self care.   Time 6   Period Weeks   Status On-going   PT LONG TERM GOAL #4   Title Pt will complete five time sit to stand in 23 seconds to demonstrate improved BLE strength and functional mobliity.    Time 6   Period Weeks   Status On-going               Plan - 07/02/15 1540    Clinical Impression Statement Reviewed safe mechanics with transfers following report of fall yesterday.  Pt able to verbalize correct technique with sit to stand but inconsistent with proper technqiue requiring verbal and tactile cueing for safety.  Began seated balance cone rotation on dynadisc to improve crossing midline and reaching outside BOS for core strenghtening with multiple LOB episodes requiring mod assistance.  Pt improving gait mechanics with hemiwalker with no Lt foot dragging or excessive ER, pt does continue to require cueing for proper sequence with HW, to improve stance phase Lt LE and increase Rt LE step length and posture.  Pt limited by fatigue at end of session, no reports of pain.     PT Next Visit Plan Continue with functional strengthening, transfer  training, gait training        Problem List Patient Active Problem List  Diagnosis Date Noted  . Cerebral infarction due to embolism of left carotid artery 03/04/2015  . Aneurysm, cerebral, nonruptured 03/04/2015  . History of ruptured arterial aneurysm 03/04/2015  . Essential hypertension 03/04/2015  . HLD (hyperlipidemia) 03/04/2015  . Alterations of sensations following CVA (cerebrovascular accident)   . Embolic cerebral infarction 01/22/2015  . Left hemiparesis 01/22/2015  . Stroke   . Cerebral embolism with cerebral infarction 01/21/2015  . Ataxia   . Brain aneurysm 01/20/2015  . TOBACCO ABUSE 01/04/2010  . BRONCHITIS, CHRONIC 01/04/2010  . CERVICAL CANCER 01/03/2010  . HYPERLIPIDEMIA 01/03/2010  . DEPRESSION 01/03/2010  . SUBARACHNOID HEMORRHAGE 01/03/2010  . [name] 01/03/2010   Ihor Austin, LPTA; CBIS [phone]  Aldona Lento 07/02/2015, 3:47 PM  [place] [address] [place], [place], [address] Phone: [phone]   Fax:  [phone]

## 2015-07-05 ENCOUNTER — Ambulatory Visit (HOSPITAL_COMMUNITY): Payer: Medicare Other

## 2015-07-05 ENCOUNTER — Ambulatory Visit (HOSPITAL_COMMUNITY): Payer: Medicare Other | Admitting: Physical Therapy

## 2015-07-05 ENCOUNTER — Encounter (HOSPITAL_COMMUNITY): Payer: Self-pay

## 2015-07-05 DIAGNOSIS — R29898 Other symptoms and signs involving the musculoskeletal system: Secondary | ICD-10-CM | POA: Diagnosis not present

## 2015-07-05 DIAGNOSIS — R262 Difficulty in walking, not elsewhere classified: Secondary | ICD-10-CM | POA: Diagnosis not present

## 2015-07-05 DIAGNOSIS — I6980 Unspecified sequelae of other cerebrovascular disease: Secondary | ICD-10-CM | POA: Diagnosis not present

## 2015-07-05 DIAGNOSIS — IMO0002 Reserved for concepts with insufficient information to code with codable children: Secondary | ICD-10-CM

## 2015-07-05 DIAGNOSIS — M6289 Other specified disorders of muscle: Secondary | ICD-10-CM | POA: Diagnosis not present

## 2015-07-05 DIAGNOSIS — R531 Weakness: Secondary | ICD-10-CM

## 2015-07-05 DIAGNOSIS — R279 Unspecified lack of coordination: Secondary | ICD-10-CM | POA: Diagnosis not present

## 2015-07-05 DIAGNOSIS — R269 Unspecified abnormalities of gait and mobility: Secondary | ICD-10-CM

## 2015-07-05 DIAGNOSIS — M6281 Muscle weakness (generalized): Secondary | ICD-10-CM | POA: Diagnosis not present

## 2015-07-05 NOTE — Therapy (Signed)
[place], [place], [address] Phone: [phone]   Fax:  [phone]  Occupational Therapy Treatment  Patient Details  Name: Diane Hutchinson MRN: [id number] Date of Birth: August 19, 1946 Referring Provider:  Charlett Blake, MD  Encounter Date: 07/05/2015      OT End of Session - 07/05/15 1454    Visit Number 12   Number of Visits 18   Date for OT Re-Evaluation 08/01/15   Authorization Type Medicare/Medicare A & B   Authorization Time Period Before 20th visit   Authorization - Visit Number 12   Authorization - Number of Visits 20   OT Start Time 2585   OT Stop Time 1350   OT Time Calculation (min) 45 min   Activity Tolerance Patient tolerated treatment well   Behavior During Therapy [facility] for tasks assessed/performed      Past Medical History  Diagnosis Date  . Migraine   . Depression   . Hypertension   . Stroke 2016  . COPD (chronic obstructive pulmonary disease)   . Anxiety     h/o of panic attack  . GERD (gastroesophageal reflux disease)     occas. use of TUMS  . Arthritis     knees    Past Surgical History  Procedure Laterality Date  . [place] surgery  2010  . Cholecystectomy      age 24  . Appendectomy      age 44  . Eye surgery  years ago    laser to both eyes for blocked tear ducts   . Brain surgery  1993    aneurysm  . Tubal ligation  years ago  . Hemorroidectomy  years ago  . Radiology with anesthesia N/A 01/20/2015    Procedure: RADIOLOGY WITH ANESTHESIA;  Surgeon: Luanne Bras, MD;  Location: [place];  Service: Radiology;  Laterality: N/A;    There were no vitals filed for this visit.  Visit Diagnosis:  Left-sided weakness  Lack of coordination due to stroke      Subjective Assessment - 07/05/15 1312    Subjective  S: I fell again. I rolled out of bed.    Currently in Pain? Yes   Pain Score 6    Pain Location Ankle   Pain Orientation Left   Pain Descriptors /  Indicators Aching   Pain Type Acute pain            [facility] OT Assessment - 07/05/15 1313    Assessment   Diagnosis CVA-left sided weakness   Precautions   Precautions Fall                  OT Treatments/Exercises (OP) - 07/05/15 1315    Exercises   Exercises Shoulder;Hand   Shoulder Exercises: Seated   Protraction Strengthening;15 reps   Protraction Weight (lbs) 1  wrist weight   Horizontal ABduction Strengthening;15 reps   Horizontal ABduction Weight (lbs) 1  wrist weight   Abduction Strengthening;15 reps   ABduction Weight (lbs) 1  wrist weight   Other Seated Exercises Shoulder press, 15X, 1# wrist weight   Shoulder Exercises: ROM/Strengthening   UBE (Upper Arm Bike) Level 1 2' forward 2' reverse                  OT Short Term Goals - 06/30/15 1612    OT SHORT TERM GOAL #1   Title Pt will be educated on and independent in HEP.    Time 4  Period Weeks   Status On-going   OT SHORT TERM GOAL #2   Title Pt will increase LUE strength to 4/5 to increase ability to assist in donning shirts.    Time 4   Period Weeks   Status On-going   OT SHORT TERM GOAL #3   Title Patient will increase grip strength by 5# and pinch strength by 3# to increase ability to hold coffee cup.    Time 4   Period Weeks   Status On-going   OT SHORT TERM GOAL #4   Title Patient will increase fine motor coordination by completing 9 hole peg test within 2 minutes.    Time 4   Period Weeks   Status On-going           OT Long Term Goals - 06/04/15 1506    OT LONG TERM GOAL #1   Title Pt will complete all BADL tasks at Mod I level, including dressing, bathing, and meal preparation.    Time 8   Period Weeks   Status On-going   OT LONG TERM GOAL #2   Title Pt will increase LUE strength to 4+/5 to increase ability to complete daily tasks.    Time 8   Period Weeks   Status On-going   OT LONG TERM GOAL #3   Title Patient will increase grip strength by 10# and pinch  strength by 5# to increase ability to grasp walker or surfaces during transfers and functional mobility tasks.    Time 8   Period Weeks   Status On-going   OT LONG TERM GOAL #4   Title Pt will increase fine motor coordination by completing 9 hole peg test in under 1 minute.    Time 8   Period Weeks   Status On-going               Plan - 07/05/15 1454    Clinical Impression Statement A: Session with focus on UB strengthening using wrist weight and UBE bike. Attempted to use theraband although patient was unable to hold onto handle and it frequenly sling shot forward. Pt completed UBE bike with instructions to constantly look at hand to make sure she did not let go. Going in reverse was much more difficult for holding onto the handle.    Plan P: Continue with strength and grip and pinch strengthening. Functional reaching at waist level.        Problem List Patient Active Problem List   Diagnosis Date Noted  . Cerebral infarction due to embolism of left carotid artery 03/04/2015  . Aneurysm, cerebral, nonruptured 03/04/2015  . History of ruptured arterial aneurysm 03/04/2015  . Essential hypertension 03/04/2015  . HLD (hyperlipidemia) 03/04/2015  . Alterations of sensations following CVA (cerebrovascular accident)   . Embolic cerebral infarction 01/22/2015  . Left hemiparesis 01/22/2015  . Stroke   . Cerebral embolism with cerebral infarction 01/21/2015  . Ataxia   . Brain aneurysm 01/20/2015  . TOBACCO ABUSE 01/04/2010  . BRONCHITIS, CHRONIC 01/04/2010  . CERVICAL CANCER 01/03/2010  . HYPERLIPIDEMIA 01/03/2010  . DEPRESSION 01/03/2010  . SUBARACHNOID HEMORRHAGE 01/03/2010  . [name] 01/03/2010    Ailene Ravel, OTR/L,CBIS  [phone]  07/05/2015, 2:58 PM  [place] [address] [place], [place], [address] Phone: [phone]   Fax:  [phone]

## 2015-07-05 NOTE — Therapy (Signed)
[place], [place], [address] Phone: [phone]   Fax:  [phone]  Physical Therapy Treatment  Patient Details  Name: Diane Hutchinson MRN: [id number] Date of Birth: [birth year] Referring Provider:  Charlett Blake, MD  Encounter Date: 07/05/2015      PT End of Session - 07/05/15 1438    Visit Number 20   Number of Visits 26   Date for PT Re-Evaluation 07/28/15   Authorization Type Medicare   Authorization Time Period recert done 8/69/64   Authorization - Visit Number 30   Authorization - Number of Visits 28   PT Start Time 3329   PT Stop Time 1435   PT Time Calculation (min) 50 min      Past Medical History  Diagnosis Date  . Migraine   . Depression   . Hypertension   . Stroke 2016  . COPD (chronic obstructive pulmonary disease)   . Anxiety     h/o of panic attack  . GERD (gastroesophageal reflux disease)     occas. use of TUMS  . Arthritis     knees    Past Surgical History  Procedure Laterality Date  . [place] surgery  2010  . Cholecystectomy      age 69  . Appendectomy      age 69  . Eye surgery  years ago    laser to both eyes for blocked tear ducts   . Brain surgery  1993    aneurysm  . Tubal ligation  years ago  . Hemorroidectomy  years ago  . Radiology with anesthesia N/A 01/20/2015    Procedure: RADIOLOGY WITH ANESTHESIA;  Surgeon: Luanne Bras, MD;  Location: [facility];  Service: Radiology;  Laterality: N/A;    There were no vitals filed for this visit.  Visit Diagnosis:  Left-sided weakness  Lack of coordination due to stroke  Difficulty walking  Abnormality of gait      Subjective Assessment - 07/05/15 1350    Subjective Pt reported fall yesterday during transfer from [facility] to [facility].  Pt states that her Lt ankle is sore but no pain    Currently in Pain? No/denies            [facility] PT Assessment - 07/05/15 1321    Strength   Right Hip Flexion  4+/5  was 3/5   Right Hip Extension 3-/5   Right Hip ABduction 4-/5   Left Hip Flexion 2/5  was 2/5   Left Hip Extension 2/5   Left Hip ABduction 2/5   Right Knee Flexion 4+/5   Right Knee Extension 4+/5   Left Knee Flexion 2+/5  was 4-/5   Left Knee Extension 3+/5  was 4-/5   Transfers   Sit to Stand --  varies depending on level of fatigue   Five time sit to stand comments  44.35 seconds with UE support and min A.    Transfers Stand Pivot Transfers   Ambulation/Gait   Ambulation/Gait Assistance 4: Min assist   Ambulation Distance (Feet) --  10 ft x 1; 20 ft x 1    Assistive device Hemi-walker   Gait Pattern Decreased step length - right;Decreased stance time - left;Decreased weight shift to left             [facility] Adult PT Treatment/Exercise - 07/05/15 1321    Ambulation/Gait   Ambulation/Gait With hemiwalker x 10 ft; second attempt x 20 ft  With min  assist.    Lumbar Exercises: Seated   Long Arc Quad on Chair Left;10 reps   LAQ on Chair Weights (lbs) 3   Sit to Stand 5 reps followed by weight shifting x 5 reps    Lumbar Exercises: Supine   Bridge 15 reps   Straight Leg Raise 10 reps;Limitations   Straight Leg Raises Limitations AA   Lumbar Exercises: Sidelying   Hip Abduction 10 reps   Hip Abduction Weights (lbs) AA   Other Sidelying Lumbar Exercises knee flexion x 10                 PT Education - 07/05/15 1438    Education provided Yes   Education Details importance of HEP and walking at home    Person(s) Educated Patient;Spouse   Methods Explanation   Comprehension Verbalized understanding          PT Short Term Goals - 06/30/15 1404    PT SHORT TERM GOAL #1   Title Patient will complete functional transfers with min assist in order to decrease burden on caregivers and increase independence with functional mobility.    Baseline Varies from min-max assist depending on level of fatigue   Time 3   Period Weeks   Status On-going   PT SHORT  TERM GOAL #2   Title Pt will complete bed mobility with min assist to allow for increased independence.   Baseline Supervision   Time 3   Period Weeks   Status Achieved   PT SHORT TERM GOAL #3   Title Pt will ambulate 15 feet with RW with min assist in order to improve functional mobility and to allow for pt to ambulate in her home.    Time 3   Period Weeks   Status Achieved   PT SHORT TERM GOAL #4   Title Pt will complete five time sit to stand test in 27 seconds to demonstrate improve BLE strength and functional mobility.    Time 2   Period Weeks   Status On-going           PT Long Term Goals - 06/30/15 1740    PT LONG TERM GOAL #1   Title Pt will complete functional transfers and bed mobility with supervision to allow for greater independence with self care.    Time 6   Period Weeks   Status On-going   PT LONG TERM GOAL #2   Title Pt will ambulate 150 feet with RW with supervision to allow for independence with functional mobility and gait.    Time 6   Period Weeks   Status On-going   PT LONG TERM GOAL #3   Title Pt will demonstrate good static standing balance for 10 minutes to demonstrate improved functional activity tolerance and to allow for pt to complete self care.   Time 6   Period Weeks   Status On-going   PT LONG TERM GOAL #4   Title Pt will complete five time sit to stand in 23 seconds to demonstrate improved BLE strength and functional mobliity.    Time 6   Period Weeks   Status On-going               Plan - 07/05/15 1439    Clinical Impression Statement Recert done on 12/25/9415 therefore progress note was not completed on this visit.  [name] has fallen twice over the weekend the first fall was due to not locking down her wheelchair while attempting to transfer; the  second fall was rolling out of bed.  [name] has significant crepitus and pain in her LT hip and knee when weight bearing therefore she tends to avoid putting weight  through her LE today therefore treatment was split between closed and open chained exercises.  MM test demonstrated decreased strength compared to 8/17 note which might be secondary to increased soreness from  the patient's fall.    PT Next Visit Plan try and promote weightbearing activites and gait         Problem List Patient Active Problem List   Diagnosis Date Noted  . Cerebral infarction due to embolism of left carotid artery 03/04/2015  . Aneurysm, cerebral, nonruptured 03/04/2015  . History of ruptured arterial aneurysm 03/04/2015  . Essential hypertension 03/04/2015  . HLD (hyperlipidemia) 03/04/2015  . Alterations of sensations following CVA (cerebrovascular accident)   . Embolic cerebral infarction 01/22/2015  . Left hemiparesis 01/22/2015  . Stroke   . Cerebral embolism with cerebral infarction 01/21/2015  . Ataxia   . Brain aneurysm 01/20/2015  . TOBACCO ABUSE 01/04/2010  . BRONCHITIS, CHRONIC 01/04/2010  . CERVICAL CANCER 01/03/2010  . HYPERLIPIDEMIA 01/03/2010  . DEPRESSION 01/03/2010  . SUBARACHNOID HEMORRHAGE 01/03/2010  . [name] 01/03/2010    Rayetta Humphrey, PT CLT [phone] 07/05/2015, 2:45 PM  [facility] [address] [place], [place], [address] Phone: [phone]   Fax:  [phone]

## 2015-07-07 ENCOUNTER — Ambulatory Visit (HOSPITAL_COMMUNITY): Payer: Medicare Other | Admitting: Occupational Therapy

## 2015-07-07 ENCOUNTER — Ambulatory Visit (HOSPITAL_COMMUNITY): Payer: Medicare Other

## 2015-07-09 ENCOUNTER — Ambulatory Visit (HOSPITAL_COMMUNITY): Payer: Medicare Other | Admitting: Occupational Therapy

## 2015-07-09 ENCOUNTER — Encounter (HOSPITAL_COMMUNITY): Payer: Self-pay | Admitting: Occupational Therapy

## 2015-07-09 ENCOUNTER — Ambulatory Visit (HOSPITAL_COMMUNITY): Payer: Medicare Other

## 2015-07-09 DIAGNOSIS — I6980 Unspecified sequelae of other cerebrovascular disease: Secondary | ICD-10-CM | POA: Diagnosis not present

## 2015-07-09 DIAGNOSIS — R279 Unspecified lack of coordination: Secondary | ICD-10-CM | POA: Diagnosis not present

## 2015-07-09 DIAGNOSIS — R269 Unspecified abnormalities of gait and mobility: Secondary | ICD-10-CM

## 2015-07-09 DIAGNOSIS — IMO0002 Reserved for concepts with insufficient information to code with codable children: Secondary | ICD-10-CM

## 2015-07-09 DIAGNOSIS — M6289 Other specified disorders of muscle: Secondary | ICD-10-CM | POA: Diagnosis not present

## 2015-07-09 DIAGNOSIS — R262 Difficulty in walking, not elsewhere classified: Secondary | ICD-10-CM | POA: Diagnosis not present

## 2015-07-09 DIAGNOSIS — R531 Weakness: Secondary | ICD-10-CM

## 2015-07-09 DIAGNOSIS — M6281 Muscle weakness (generalized): Secondary | ICD-10-CM | POA: Diagnosis not present

## 2015-07-09 DIAGNOSIS — G8194 Hemiplegia, unspecified affecting left nondominant side: Secondary | ICD-10-CM

## 2015-07-09 DIAGNOSIS — R29898 Other symptoms and signs involving the musculoskeletal system: Secondary | ICD-10-CM

## 2015-07-09 DIAGNOSIS — Z7409 Other reduced mobility: Secondary | ICD-10-CM

## 2015-07-09 NOTE — Therapy (Signed)
[place], [place], [address] Phone: [phone]   Fax:  [phone]  Occupational Therapy Treatment  Patient Details  Name: Diane Hutchinson MRN: [id number] Date of Birth: 12-Jul-1946 Referring Provider:  Neale Burly, MD  Encounter Date: 07/09/2015      OT End of Session - 07/09/15 1610    Visit Number 13   Number of Visits 18   Date for OT Re-Evaluation 08/01/15   Authorization Type Medicare/Medicare A & B   Authorization Time Period Before 20th visit   Authorization - Visit Number 13   Authorization - Number of Visits 20   OT Start Time 1301   OT Stop Time 1346   OT Time Calculation (min) 45 min   Activity Tolerance Patient tolerated treatment well   Behavior During Therapy [facility] for tasks assessed/performed      Past Medical History  Diagnosis Date  . Migraine   . Depression   . Hypertension   . Stroke 2016  . COPD (chronic obstructive pulmonary disease)   . Anxiety     h/o of panic attack  . GERD (gastroesophageal reflux disease)     occas. use of TUMS  . Arthritis     knees    Past Surgical History  Procedure Laterality Date  . [place] surgery  2010  . Cholecystectomy      age 36  . Appendectomy      age 89  . Eye surgery  years ago    laser to both eyes for blocked tear ducts   . Brain surgery  1993    aneurysm  . Tubal ligation  years ago  . Hemorroidectomy  years ago  . Radiology with anesthesia N/A 01/20/2015    Procedure: RADIOLOGY WITH ANESTHESIA;  Surgeon: Luanne Bras, MD;  Location: [facility];  Service: Radiology;  Laterality: N/A;    There were no vitals filed for this visit.  Visit Diagnosis:  Left-sided weakness  Lack of coordination due to stroke  Decreased grip strength of left hand  Decreased pinch strength      Subjective Assessment - 07/09/15 1606    Subjective  S: I just can't get this hand to do what I want it to.    Currently in Pain? No/denies            [facility] OT Assessment - 07/09/15 1300    Assessment   Diagnosis CVA-left sided weakness   Precautions   Precautions Fall                  OT Treatments/Exercises (OP) - 07/09/15 1305    Exercises   Exercises Shoulder;Hand   Hand Exercises   Theraputty - Grip red-pronated   Theraputty - Pinch 3 point, focusing on using only thumb, index, and middle fingers   Hand Gripper with Large Beads 6/6 beads gripper set at 11#  pt used right hand for active assist    Hand Gripper with Medium Beads 13/13 beads with gripper set at 7#  pt used right hand as active assist   Fine Motor Coordination   Other Fine Motor Exercises Pt used yellow resistive clothespin to grasp high resistance sponges and place in bucket. Pt able to complete 6/30 sponges this session with increased time. Pt used 1# wrist weight to decrease ataxic movements and increase proprioception.                   OT Short Term Goals -  06/30/15 1612    OT SHORT TERM GOAL #1   Title Pt will be educated on and independent in HEP.    Time 4   Period Weeks   Status On-going   OT SHORT TERM GOAL #2   Title Pt will increase LUE strength to 4/5 to increase ability to assist in donning shirts.    Time 4   Period Weeks   Status On-going   OT SHORT TERM GOAL #3   Title Patient will increase grip strength by 5# and pinch strength by 3# to increase ability to hold coffee cup.    Time 4   Period Weeks   Status On-going   OT SHORT TERM GOAL #4   Title Patient will increase fine motor coordination by completing 9 hole peg test within 2 minutes.    Time 4   Period Weeks   Status On-going           OT Long Term Goals - 06/04/15 1506    OT LONG TERM GOAL #1   Title Pt will complete all BADL tasks at Mod I level, including dressing, bathing, and meal preparation.    Time 8   Period Weeks   Status On-going   OT LONG TERM GOAL #2   Title Pt will increase LUE strength to 4+/5 to increase ability to  complete daily tasks.    Time 8   Period Weeks   Status On-going   OT LONG TERM GOAL #3   Title Patient will increase grip strength by 10# and pinch strength by 5# to increase ability to grasp walker or surfaces during transfers and functional mobility tasks.    Time 8   Period Weeks   Status On-going   OT LONG TERM GOAL #4   Title Pt will increase fine motor coordination by completing 9 hole peg test in under 1 minute.    Time 8   Period Weeks   Status On-going               Plan - 07/09/15 1610    Clinical Impression Statement A: Pt participated in grip and pinch strengthening this session. Pt unable to complete hand gripper activity with left hand only, pt used right hand as active assist to steady gripper and place over beads. Pt easily frustrated during activities today.    Plan P: UE strengthening. [facility] [place] activity/game        Problem List Patient Active Problem List   Diagnosis Date Noted  . Cerebral infarction due to embolism of left carotid artery 03/04/2015  . Aneurysm, cerebral, nonruptured 03/04/2015  . History of ruptured arterial aneurysm 03/04/2015  . Essential hypertension 03/04/2015  . HLD (hyperlipidemia) 03/04/2015  . Alterations of sensations following CVA (cerebrovascular accident)   . Embolic cerebral infarction 01/22/2015  . Left hemiparesis 01/22/2015  . Stroke   . Cerebral embolism with cerebral infarction 01/21/2015  . Ataxia   . Brain aneurysm 01/20/2015  . TOBACCO ABUSE 01/04/2010  . BRONCHITIS, CHRONIC 01/04/2010  . CERVICAL CANCER 01/03/2010  . HYPERLIPIDEMIA 01/03/2010  . DEPRESSION 01/03/2010  . SUBARACHNOID HEMORRHAGE 01/03/2010  . [name] 01/03/2010    Guadelupe Sabin, OTR/L  [phone]  07/09/2015, 4:12 PM  [facility] [address] [place], [place], [address] Phone: [phone]   Fax:  [phone]

## 2015-07-09 NOTE — Therapy (Signed)
[place], [place], [address] Phone: [phone]   Fax:  [phone]  Physical Therapy Treatment  Patient Details  Name: Diane Hutchinson MRN: [id number] Date of Birth: [birth year] Referring Provider:  Neale Burly, MD  Encounter Date: 07/09/2015      PT End of Session - 07/09/15 1430    Visit Number 21   Number of Visits 26   Date for PT Re-Evaluation 07/28/15   Authorization Type Medicare   Authorization Time Period recert done 9/83/38 Gcode done 18th visit   Authorization - Visit Number 21   Authorization - Number of Visits 28   PT Start Time 2505   PT Stop Time 1430   PT Time Calculation (min) 42 min   Equipment Utilized During Treatment Gait belt   Activity Tolerance Patient tolerated treatment well   Behavior During Therapy [facility] for tasks assessed/performed      Past Medical History  Diagnosis Date  . Migraine   . Depression   . Hypertension   . Stroke 2016  . COPD (chronic obstructive pulmonary disease)   . Anxiety     h/o of panic attack  . GERD (gastroesophageal reflux disease)     occas. use of TUMS  . Arthritis     knees    Past Surgical History  Procedure Laterality Date  . [place] surgery  2010  . Cholecystectomy      age 5  . Appendectomy      age 78  . Eye surgery  years ago    laser to both eyes for blocked tear ducts   . Brain surgery  1993    aneurysm  . Tubal ligation  years ago  . Hemorroidectomy  years ago  . Radiology with anesthesia N/A 01/20/2015    Procedure: RADIOLOGY WITH ANESTHESIA;  Surgeon: Luanne Bras, MD;  Location: [facility];  Service: Radiology;  Laterality: N/A;    There were no vitals filed for this visit.  Visit Diagnosis:  Left-sided weakness  Lack of coordination due to stroke  Difficulty walking  Abnormality of gait  Hemiparesis, left  Impaired functional mobility and activity tolerance      Subjective Assessment - 07/09/15 1351     Subjective Pain free today, no reports of falls since last session.     Currently in Pain? No/denies            [facility] Adult PT Treatment/Exercise - 07/09/15 0001    Exercises   Exercises Knee/Hip   Knee/Hip Exercises: Standing   Step Down 15 reps   Step Down Limitations single leg balance reach (toe tapping forward and backward while standing on Lt LE   Functional Squat 10 reps   Functional Squat Limitations minisquats   Other Standing Knee Exercises sidestepping in // bars x 3 RT;  Toe tapping step 4 in;  High marching 2x 10 reps   Other Standing Knee Exercises weight shifting 2 sets x 10 in // bars   Knee/Hip Exercises: Seated   Long Arc Quad 10 reps   Long Arc Quad Weight 4 lbs.   Sit to Sand 10 reps;with UE support  cueing for safety             PT Short Term Goals - 06/30/15 1404    PT SHORT TERM GOAL #1   Title Patient will complete functional transfers with min assist in order to decrease burden on caregivers and increase independence with functional mobility.  Baseline Varies from min-max assist depending on level of fatigue   Time 3   Period Weeks   Status On-going   PT SHORT TERM GOAL #2   Title Pt will complete bed mobility with min assist to allow for increased independence.   Baseline Supervision   Time 3   Period Weeks   Status Achieved   PT SHORT TERM GOAL #3   Title Pt will ambulate 15 feet with RW with min assist in order to improve functional mobility and to allow for pt to ambulate in her home.    Time 3   Period Weeks   Status Achieved   PT SHORT TERM GOAL #4   Title Pt will complete five time sit to stand test in 27 seconds to demonstrate improve BLE strength and functional mobility.    Time 2   Period Weeks   Status On-going           PT Long Term Goals - 06/30/15 1740    PT LONG TERM GOAL #1   Title Pt will complete functional transfers and bed mobility with supervision to allow for greater independence with self care.     Time 6   Period Weeks   Status On-going   PT LONG TERM GOAL #2   Title Pt will ambulate 150 feet with RW with supervision to allow for independence with functional mobility and gait.    Time 6   Period Weeks   Status On-going   PT LONG TERM GOAL #3   Title Pt will demonstrate good static standing balance for 10 minutes to demonstrate improved functional activity tolerance and to allow for pt to complete self care.   Time 6   Period Weeks   Status On-going   PT LONG TERM GOAL #4   Title Pt will complete five time sit to stand in 23 seconds to demonstrate improved BLE strength and functional mobliity.    Time 6   Period Weeks   Status On-going          Plan - 07/09/15 1615    Clinical Impression Statement Session focus on improving weightbearing on Lt LE with SLS standing activities and gait.  Pt continues to demonstrate Lt side neglact and unsafe with transfers.  Pt able to verbalize correct technique but inconsistent with demonstration requiring cueing for locking brakes and hand placement for sit to stand and proper sequence with hemiwakler.     PT Next Visit Plan try and promote weightbearing activites and gait         Problem List Patient Active Problem List   Diagnosis Date Noted  . Cerebral infarction due to embolism of left carotid artery 03/04/2015  . Aneurysm, cerebral, nonruptured 03/04/2015  . History of ruptured arterial aneurysm 03/04/2015  . Essential hypertension 03/04/2015  . HLD (hyperlipidemia) 03/04/2015  . Alterations of sensations following CVA (cerebrovascular accident)   . Embolic cerebral infarction 01/22/2015  . Left hemiparesis 01/22/2015  . Stroke   . Cerebral embolism with cerebral infarction 01/21/2015  . Ataxia   . Brain aneurysm 01/20/2015  . TOBACCO ABUSE 01/04/2010  . BRONCHITIS, CHRONIC 01/04/2010  . CERVICAL CANCER 01/03/2010  . HYPERLIPIDEMIA 01/03/2010  . DEPRESSION 01/03/2010  . SUBARACHNOID HEMORRHAGE 01/03/2010  . [name]  01/03/2010   Ihor Austin, LPTA; CBIS [phone]  Aldona Lento 07/09/2015, 4:32 PM  [facility] [address] [place], [place], [address] Phone: [phone]   Fax:  [phone]

## 2015-07-12 ENCOUNTER — Ambulatory Visit (HOSPITAL_COMMUNITY): Payer: Medicare Other

## 2015-07-12 ENCOUNTER — Ambulatory Visit (HOSPITAL_COMMUNITY): Payer: Medicare Other | Admitting: Occupational Therapy

## 2015-07-12 ENCOUNTER — Encounter (HOSPITAL_COMMUNITY): Payer: Self-pay | Admitting: Occupational Therapy

## 2015-07-12 ENCOUNTER — Encounter (HOSPITAL_COMMUNITY): Payer: Self-pay

## 2015-07-12 DIAGNOSIS — R262 Difficulty in walking, not elsewhere classified: Secondary | ICD-10-CM

## 2015-07-12 DIAGNOSIS — M6289 Other specified disorders of muscle: Secondary | ICD-10-CM | POA: Diagnosis not present

## 2015-07-12 DIAGNOSIS — I6980 Unspecified sequelae of other cerebrovascular disease: Secondary | ICD-10-CM | POA: Diagnosis not present

## 2015-07-12 DIAGNOSIS — IMO0002 Reserved for concepts with insufficient information to code with codable children: Secondary | ICD-10-CM

## 2015-07-12 DIAGNOSIS — G8194 Hemiplegia, unspecified affecting left nondominant side: Secondary | ICD-10-CM

## 2015-07-12 DIAGNOSIS — M6281 Muscle weakness (generalized): Secondary | ICD-10-CM | POA: Diagnosis not present

## 2015-07-12 DIAGNOSIS — R531 Weakness: Secondary | ICD-10-CM

## 2015-07-12 DIAGNOSIS — Z789 Other specified health status: Secondary | ICD-10-CM

## 2015-07-12 DIAGNOSIS — R279 Unspecified lack of coordination: Secondary | ICD-10-CM | POA: Diagnosis not present

## 2015-07-12 DIAGNOSIS — R29898 Other symptoms and signs involving the musculoskeletal system: Secondary | ICD-10-CM

## 2015-07-12 DIAGNOSIS — R269 Unspecified abnormalities of gait and mobility: Secondary | ICD-10-CM

## 2015-07-12 DIAGNOSIS — Z7409 Other reduced mobility: Secondary | ICD-10-CM

## 2015-07-12 NOTE — Therapy (Signed)
[facility] [address] [place], [place], [address] Phone: [phone]   Fax:  [phone]  Physical Therapy Treatment  Patient Details  Name: Diane Hutchinson MRN: [id number] Date of Birth: September 18, 1946 Referring Provider:  Charlett Blake, MD  Encounter Date: 07/12/2015      PT End of Session - 07/12/15 1558    Visit Number 22   Number of Visits 26   Date for PT Re-Evaluation 07/28/15   Authorization Type Medicare   Authorization Time Period recert done 12/15/52 G-code done 18th visit   Authorization - Visit Number 43   Authorization - Number of Visits 28   PT Start Time 1345   PT Stop Time 1432   PT Time Calculation (min) 47 min   Equipment Utilized During Treatment Gait belt   Activity Tolerance Patient tolerated treatment well;Patient limited by fatigue   Behavior During Therapy [facility] for tasks assessed/performed      Past Medical History  Diagnosis Date  . Migraine   . Depression   . Hypertension   . Stroke 2016  . COPD (chronic obstructive pulmonary disease)   . Anxiety     h/o of panic attack  . GERD (gastroesophageal reflux disease)     occas. use of TUMS  . Arthritis     knees    Past Surgical History  Procedure Laterality Date  . [place] surgery  2010  . Cholecystectomy      age 69  . Appendectomy      age 69  . Eye surgery  years ago    laser to both eyes for blocked tear ducts   . Brain surgery  1993    aneurysm  . Tubal ligation  years ago  . Hemorroidectomy  years ago  . Radiology with anesthesia N/A 01/20/2015    Procedure: RADIOLOGY WITH ANESTHESIA;  Surgeon: Luanne Bras, MD;  Location: [place];  Service: Radiology;  Laterality: N/A;    There were no vitals filed for this visit.  Visit Diagnosis:  Lack of coordination due to stroke  Decreased grip strength of left hand  Decreased pinch strength  Decreased activities of daily living (ADL)  Left-sided weakness  Difficulty  walking  Abnormality of gait  Hemiparesis, left  Impaired functional mobility and activity tolerance      Subjective Assessment - 07/12/15 1544    Subjective Pt reports no major changes since last session. Is feeling ok today, no c/o pain, and denies falls since last visit. Husband exresses concern over swelling in R ankle, and bruising in R toes, which he reports is from AFO use.    Patient is accompained by: Family member   Pertinent History Pt has history of multiple intracranial aneurysms with Lt MCA elective embolization using stent assistance 01/20/15. Neuro consult suspects non-dominant R brain infarct secondary to complication of embolization.    Limitations Standing;Walking;House hold activities   How long can you stand comfortably? 15-20 minutes   How long can you walk comfortably? 5-15 minutes   Patient Stated Goals Wants strength back in her Lt hand, Lt foot, be able to walk, be able to get in and out of bed/chair independently   Currently in Pain? No/denies                         [facility] Adult PT Treatment/Exercise - 07/12/15 1546    Bed Mobility   Rolling Right 4: Min assist   Rolling Right  Details (indicate cue type and reason) 2x10 rolls inititation reaching for cones for trunk strength   minA to help c getting scapular off mat.   Rolling Left 4: Min guard  2x10   (see above)    Transfers   Sit to Stand 4: Min guard;Other (comment);Multiple attempts   Sit to Stand Details Tactile cues for weight shifting;Tactile cues for posture;Tactile cues for placement;Verbal cues for technique;Verbal cues for precautions/safety;Verbal cues for safe use of DME/AE;Manual facilitation for placement  Performed 10x c HM on RUE   Ambulation/Gait   Ambulation/Gait Assistance 4: Min assist   Ambulation/Gait Assistance Details Req assistance c HW placement   Ambulation Distance (Feet) 60 Feet  2x30   Assistive device Hemi-walker   Gait Pattern --  3 point gait,  progresses LLE well without circumduction or d   Lumbar Exercises: Seated   Long Arc Quad on Chair Both;2 sets;15 reps  4# on R, 0# on L    LAQ on Chair Weights (lbs) 4  R only   Sit to Stand 10 reps  c HW (see above)    Lumbar Exercises: Supine   Bridge --  3x6 reps   Knee/Hip Exercises: Standing   SLS L SLS c HW: 10x2s  MinA   Knee/Hip Exercises: Seated   Marching Limitations 2x10  bilat   Knee/Hip Exercises: Supine   Other Supine Knee/Hip Exercises clams c RedTB 3x10                PT Education - 07/12/15 1557    Education provided Yes   Education Details Addressed husbands concerns over mild LE swelling.    Person(s) Educated Patient;Spouse   Methods Explanation   Comprehension Verbalized understanding          PT Short Term Goals - 06/30/15 1404    PT SHORT TERM GOAL #1   Title Patient will complete functional transfers with min assist in order to decrease burden on caregivers and increase independence with functional mobility.    Baseline Varies from min-max assist depending on level of fatigue   Time 3   Period Weeks   Status On-going   PT SHORT TERM GOAL #2   Title Pt will complete bed mobility with min assist to allow for increased independence.   Baseline Supervision   Time 3   Period Weeks   Status Achieved   PT SHORT TERM GOAL #3   Title Pt will ambulate 15 feet with RW with min assist in order to improve functional mobility and to allow for pt to ambulate in her home.    Time 3   Period Weeks   Status Achieved   PT SHORT TERM GOAL #4   Title Pt will complete five time sit to stand test in 27 seconds to demonstrate improve BLE strength and functional mobility.    Time 2   Period Weeks   Status On-going           PT Long Term Goals - 06/30/15 1740    PT LONG TERM GOAL #1   Title Pt will complete functional transfers and bed mobility with supervision to allow for greater independence with self care.    Time 6   Period Weeks   Status  On-going   PT LONG TERM GOAL #2   Title Pt will ambulate 150 feet with RW with supervision to allow for independence with functional mobility and gait.    Time 6   Period Weeks   Status On-going  PT LONG TERM GOAL #3   Title Pt will demonstrate good static standing balance for 10 minutes to demonstrate improved functional activity tolerance and to allow for pt to complete self care.   Time 6   Period Weeks   Status On-going   PT LONG TERM GOAL #4   Title Pt will complete five time sit to stand in 23 seconds to demonstrate improved BLE strength and functional mobliity.    Time 6   Period Weeks   Status On-going               Plan - 07/12/15 1600    Clinical Impression Statement Pt demonstrating good effort this session, motivated to participate although fairly tired. Pt contiues to demonstrated isolated improvements of strength, but continues to have difficulty with improving coordination during gross motor coordiantion movements.    Pt will benefit from skilled therapeutic intervention in order to improve on the following deficits Abnormal gait;Decreased activity tolerance;Decreased balance;Decreased coordination;Decreased mobility;Decreased strength;Difficulty walking;Pain   Rehab Potential Good   PT Frequency 2x / week   PT Duration 4 weeks   PT Treatment/Interventions ADLs/Self Care Home Management;Gait training;Functional mobility training;Therapeutic activities;Therapeutic exercise;Balance training;Neuromuscular re-education;Patient/family education;Manual techniques   PT Next Visit Plan try and promote weightbearing activites and gait    PT Home Exercise Plan No changes this session.    Consulted and Agree with Plan of Care Patient;Family member/caregiver        Problem List Patient Active Problem List   Diagnosis Date Noted  . Cerebral infarction due to embolism of left carotid artery 03/04/2015  . Aneurysm, cerebral, nonruptured 03/04/2015  . History of ruptured  arterial aneurysm 03/04/2015  . Essential hypertension 03/04/2015  . HLD (hyperlipidemia) 03/04/2015  . Alterations of sensations following CVA (cerebrovascular accident)   . Embolic cerebral infarction 01/22/2015  . Left hemiparesis 01/22/2015  . Stroke   . Cerebral embolism with cerebral infarction 01/21/2015  . Ataxia   . Brain aneurysm 01/20/2015  . TOBACCO ABUSE 01/04/2010  . BRONCHITIS, CHRONIC 01/04/2010  . CERVICAL CANCER 01/03/2010  . HYPERLIPIDEMIA 01/03/2010  . DEPRESSION 01/03/2010  . SUBARACHNOID HEMORRHAGE 01/03/2010  . C O P D 01/03/2010    Buccola,Allan C 07/12/2015, 4:10 PM 4:10 PM  Etta Grandchild, PT, DPT [place] License # [id number]        [facility] [address] [place], [place], [address] Phone: [phone]   Fax:  [phone]

## 2015-07-12 NOTE — Therapy (Signed)
[place], [place], [address] Phone: [phone]   Fax:  [phone]  Occupational Therapy Treatment  Patient Details  Name: Diane Hutchinson MRN: [id number] Date of Birth: Sep 13, 1946 Referring Provider:  Charlett Blake, MD  Encounter Date: 07/12/2015      OT End of Session - 07/12/15 1345    Visit Number 14   Number of Visits 18   Date for OT Re-Evaluation 08/01/15   Authorization Type Medicare/Medicare A & B   Authorization Time Period Before 20th visit   Authorization - Visit Number 14   Authorization - Number of Visits 20   OT Start Time 1300   OT Stop Time 1343   OT Time Calculation (min) 43 min   Activity Tolerance Patient tolerated treatment well   Behavior During Therapy [facility] for tasks assessed/performed      Past Medical History  Diagnosis Date  . Migraine   . Depression   . Hypertension   . Stroke 2016  . COPD (chronic obstructive pulmonary disease)   . Anxiety     h/o of panic attack  . GERD (gastroesophageal reflux disease)     occas. use of TUMS  . Arthritis     knees    Past Surgical History  Procedure Laterality Date  . [place] surgery  2010  . Cholecystectomy      age 61  . Appendectomy      age 48  . Eye surgery  years ago    laser to both eyes for blocked tear ducts   . Brain surgery  1993    aneurysm  . Tubal ligation  years ago  . Hemorroidectomy  years ago  . Radiology with anesthesia N/A 01/20/2015    Procedure: RADIOLOGY WITH ANESTHESIA;  Surgeon: Luanne Bras, MD;  Location: [place];  Service: Radiology;  Laterality: N/A;    There were no vitals filed for this visit.  Visit Diagnosis:  Decreased grip strength of left hand  Lack of coordination due to stroke  Decreased pinch strength  Decreased activities of daily living (ADL)      Subjective Assessment - 07/12/15 1257    Subjective  S: It just doesn't want to hold onto these pegs.    Currently  in Pain? No/denies            [facility] OT Assessment - 07/12/15 1256    Assessment   Diagnosis CVA-left sided weakness   Precautions   Precautions Fall                  OT Treatments/Exercises (OP) - 07/12/15 1303    Exercises   Exercises Shoulder;Hand   Hand Exercises   Small Pegboard Pt participated in small pegboard activity, using right hand to place small pegs into left hand. Pt used two point pinch, with emphasis placed on watching left hand to promote correct pinch. Pt placed 6 small pegs with mod-max difficulty. Pt has the most difficulty maintaining 2 pt pinch on the peg.    Fine Motor Coordination   Other Fine Motor Exercises Pt placed 10 large casings and 10 small casings into red plastic holder. Pt had max difficulty grasping shells with left hand. Pt used right hand to place casings into left hand, mod difficulty placing casings into plastic container once in left hand.                   OT Short Term Goals - 06/30/15  [place] #1   Title Pt will be educated on and independent in HEP.    Time 4   Period Weeks   Status On-going   OT SHORT TERM GOAL #2   Title Pt will increase LUE strength to 4/5 to increase ability to assist in donning shirts.    Time 4   Period Weeks   Status On-going   OT SHORT TERM GOAL #3   Title Patient will increase grip strength by 5# and pinch strength by 3# to increase ability to hold coffee cup.    Time 4   Period Weeks   Status On-going   OT SHORT TERM GOAL #4   Title Patient will increase fine motor coordination by completing 9 hole peg test within 2 minutes.    Time 4   Period Weeks   Status On-going           OT Long Term Goals - 06/04/15 1506    OT LONG TERM GOAL #1   Title Pt will complete all BADL tasks at Mod I level, including dressing, bathing, and meal preparation.    Time 8   Period Weeks   Status On-going   OT LONG TERM GOAL #2   Title Pt will increase LUE strength to 4+/5  to increase ability to complete daily tasks.    Time 8   Period Weeks   Status On-going   OT LONG TERM GOAL #3   Title Patient will increase grip strength by 10# and pinch strength by 5# to increase ability to grasp walker or surfaces during transfers and functional mobility tasks.    Time 8   Period Weeks   Status On-going   OT LONG TERM GOAL #4   Title Pt will increase fine motor coordination by completing 9 hole peg test in under 1 minute.    Time 8   Period Weeks   Status On-going               Plan - 07/12/15 1522    Clinical Impression Statement A: Pt participated in fine motor coordination tasks focusing on placing objects using 2 point pinch. Pt required mod verbal cuing to use vision to compensate for loss of sensation/feeling to determine if objects were pinched between her fingers. Pt was able to place small pegs with moderate frustration and reminders to slow down and take her time.    Plan P: UE strengthing, grip and pinch tasks. Focus on using visual cues during activities.         Problem List Patient Active Problem List   Diagnosis Date Noted  . Cerebral infarction due to embolism of left carotid artery 03/04/2015  . Aneurysm, cerebral, nonruptured 03/04/2015  . History of ruptured arterial aneurysm 03/04/2015  . Essential hypertension 03/04/2015  . HLD (hyperlipidemia) 03/04/2015  . Alterations of sensations following CVA (cerebrovascular accident)   . Embolic cerebral infarction 01/22/2015  . Left hemiparesis 01/22/2015  . Stroke   . Cerebral embolism with cerebral infarction 01/21/2015  . Ataxia   . Brain aneurysm 01/20/2015  . TOBACCO ABUSE 01/04/2010  . BRONCHITIS, CHRONIC 01/04/2010  . CERVICAL CANCER 01/03/2010  . HYPERLIPIDEMIA 01/03/2010  . DEPRESSION 01/03/2010  . SUBARACHNOID HEMORRHAGE 01/03/2010  . [name] 01/03/2010    Guadelupe Sabin, OTR/L  [phone]  07/12/2015, Ester Rink PM  [place] [address] [place], [place], [address] Phone: [phone]   Fax:  [phone]    

## 2015-07-14 ENCOUNTER — Ambulatory Visit (HOSPITAL_COMMUNITY): Payer: Medicare Other | Admitting: Occupational Therapy

## 2015-07-14 ENCOUNTER — Ambulatory Visit (HOSPITAL_COMMUNITY): Payer: Medicare Other | Admitting: Physical Therapy

## 2015-07-14 ENCOUNTER — Encounter (HOSPITAL_COMMUNITY): Payer: Self-pay | Admitting: Occupational Therapy

## 2015-07-14 DIAGNOSIS — R279 Unspecified lack of coordination: Secondary | ICD-10-CM | POA: Diagnosis not present

## 2015-07-14 DIAGNOSIS — I6980 Unspecified sequelae of other cerebrovascular disease: Secondary | ICD-10-CM | POA: Diagnosis not present

## 2015-07-14 DIAGNOSIS — R269 Unspecified abnormalities of gait and mobility: Secondary | ICD-10-CM

## 2015-07-14 DIAGNOSIS — Z7409 Other reduced mobility: Secondary | ICD-10-CM

## 2015-07-14 DIAGNOSIS — IMO0002 Reserved for concepts with insufficient information to code with codable children: Secondary | ICD-10-CM

## 2015-07-14 DIAGNOSIS — R531 Weakness: Secondary | ICD-10-CM

## 2015-07-14 DIAGNOSIS — R29898 Other symptoms and signs involving the musculoskeletal system: Secondary | ICD-10-CM

## 2015-07-14 DIAGNOSIS — R262 Difficulty in walking, not elsewhere classified: Secondary | ICD-10-CM | POA: Diagnosis not present

## 2015-07-14 DIAGNOSIS — M6289 Other specified disorders of muscle: Secondary | ICD-10-CM | POA: Diagnosis not present

## 2015-07-14 DIAGNOSIS — M6281 Muscle weakness (generalized): Secondary | ICD-10-CM | POA: Diagnosis not present

## 2015-07-14 NOTE — Therapy (Signed)
[place], [place], [address] Phone: [phone]   Fax:  [phone]  Occupational Therapy Treatment  Patient Details  Name: Diane Hutchinson MRN: [id number] Date of Birth: 28-Feb-1946 Referring Provider:  Charlett Blake, MD  Encounter Date: 07/14/2015      OT End of Session - 07/14/15 1625    Visit Number 15   Number of Visits 18   Date for OT Re-Evaluation 08/01/15   Authorization Type Medicare/Medicare A & B   Authorization Time Period Before 20th visit   Authorization - Visit Number 15   Authorization - Number of Visits 20   OT Start Time 1301   OT Stop Time 1345   OT Time Calculation (min) 44 min   Activity Tolerance Patient tolerated treatment well   Behavior During Therapy [facility] for tasks assessed/performed      Past Medical History  Diagnosis Date  . Migraine   . Depression   . Hypertension   . Stroke 2016  . COPD (chronic obstructive pulmonary disease)   . Anxiety     h/o of panic attack  . GERD (gastroesophageal reflux disease)     occas. use of TUMS  . Arthritis     knees    Past Surgical History  Procedure Laterality Date  . [place] surgery  2010  . Cholecystectomy      age 27  . Appendectomy      age 33  . Eye surgery  years ago    laser to both eyes for blocked tear ducts   . Brain surgery  1993    aneurysm  . Tubal ligation  years ago  . Hemorroidectomy  years ago  . Radiology with anesthesia N/A 01/20/2015    Procedure: RADIOLOGY WITH ANESTHESIA;  Surgeon: Luanne Bras, MD;  Location: [place];  Service: Radiology;  Laterality: N/A;    There were no vitals filed for this visit.  Visit Diagnosis:  Lack of coordination due to stroke  Decreased pinch strength  Left-sided weakness      Subjective Assessment - 07/14/15 1620    Subjective  S: I had a good workout with the therapist last session.    Currently in Pain? No/denies            [facility] OT  Assessment - 07/14/15 1255    Assessment   Diagnosis CVA-left sided weakness   Precautions   Precautions Fall                  OT Treatments/Exercises (OP) - 07/14/15 1621    Exercises   Exercises Shoulder;Hand;Wrist   Wrist Exercises   Forearm Supination Strengthening;10 reps   Bar Weights/Barbell (Forearm Supination) 1 lb   Forearm Pronation Strengthening;10 reps   Bar Weights/Barbell (Forearm Pronation) 1 lb   Wrist Flexion Strengthening;10 reps   Bar Weights/Barbell (Wrist Flexion) 1 lb   Wrist Extension Strengthening;10 reps   Bar Weights/Barbell (Wrist Extension) 1 lb   Wrist Radial Deviation Strengthening;10 reps   Bar Weights/Barbell (Radial Deviation) 1 lb   Wrist Ulnar Deviation Strengthening;10 reps   Bar Weights/Barbell (Ulnar Deviation) 1 lb   Fine Motor Coordination   Fine Motor Coordination Large Pegboard   Large Pegboard Pt placed large multi-colored pegs into large blue pegboard positioned on stand-up easel, according to a pattern using LUE. Pt had mod difficulty grasping pegs from box and manipulating in left hand. Pt able to complete pattern and place 22/22 pegs with extended  time. Pt used right hand to assist in manipulating pegs approximately 5% of the time, otherwise used left hand. Pt required mod cuing to look at pegs in left hand and to position in appropriate pinch position.                   OT Short Term Goals - 06/30/15 1612    OT SHORT TERM GOAL #1   Title Pt will be educated on and independent in HEP.    Time 4   Period Weeks   Status On-going   OT SHORT TERM GOAL #2   Title Pt will increase LUE strength to 4/5 to increase ability to assist in donning shirts.    Time 4   Period Weeks   Status On-going   OT SHORT TERM GOAL #3   Title Patient will increase grip strength by 5# and pinch strength by 3# to increase ability to hold coffee cup.    Time 4   Period Weeks   Status On-going   OT SHORT TERM GOAL #4   Title Patient  will increase fine motor coordination by completing 9 hole peg test within 2 minutes.    Time 4   Period Weeks   Status On-going           OT Long Term Goals - 06/04/15 1506    OT LONG TERM GOAL #1   Title Pt will complete all BADL tasks at Mod I level, including dressing, bathing, and meal preparation.    Time 8   Period Weeks   Status On-going   OT LONG TERM GOAL #2   Title Pt will increase LUE strength to 4+/5 to increase ability to complete daily tasks.    Time 8   Period Weeks   Status On-going   OT LONG TERM GOAL #3   Title Patient will increase grip strength by 10# and pinch strength by 5# to increase ability to grasp walker or surfaces during transfers and functional mobility tasks.    Time 8   Period Weeks   Status On-going   OT LONG TERM GOAL #4   Title Pt will increase fine motor coordination by completing 9 hole peg test in under 1 minute.    Time 8   Period Weeks   Status On-going               Plan - 07/14/15 1625    Clinical Impression Statement A: Pt participated in fine motor coordination exercises and wrist strengthening exercises. Pt completed pegboard pattern on stand-up easel this session, focusing on shoulder flexion and protraction along with fine motor coordination. Pt required intermittent rest breaks due to fatigue.  Pt had mod difficulty isolating wrist during wrist strengthening exercises.    Plan P: UE strengthening-focusing on isolating wrist & maintaining grasp on weight, during fine motor exercises focus on visual cues to complete tasks to carry over to daily tasks.         Problem List Patient Active Problem List   Diagnosis Date Noted  . Cerebral infarction due to embolism of left carotid artery 03/04/2015  . Aneurysm, cerebral, nonruptured 03/04/2015  . History of ruptured arterial aneurysm 03/04/2015  . Essential hypertension 03/04/2015  . HLD (hyperlipidemia) 03/04/2015  . Alterations of sensations following CVA  (cerebrovascular accident)   . Embolic cerebral infarction 01/22/2015  . Left hemiparesis 01/22/2015  . Stroke   . Cerebral embolism with cerebral infarction 01/21/2015  . Ataxia   . Brain aneurysm  01/20/2015  . TOBACCO ABUSE 01/04/2010  . BRONCHITIS, CHRONIC 01/04/2010  . CERVICAL CANCER 01/03/2010  . HYPERLIPIDEMIA 01/03/2010  . DEPRESSION 01/03/2010  . SUBARACHNOID HEMORRHAGE 01/03/2010  . [name] 01/03/2010    Guadelupe Sabin, OTR/L  [phone]  07/14/2015, 4:33 PM  [place] [address] [place], [place], [address] Phone: [phone]   Fax:  [phone]

## 2015-07-14 NOTE — Therapy (Signed)
[place], [place], [address] Phone: [phone]   Fax:  [phone]  Physical Therapy Treatment  Patient Details  Name: Diane Hutchinson MRN: [id number] Date of Birth: Sep 29, 1946 Referring Provider:  Neale Burly, MD  Encounter Date: 07/14/2015      PT End of Session - 07/14/15 1516    Visit Number 23   Number of Visits 26   Date for PT Re-Evaluation 07/28/15   Authorization Type Medicare   Authorization Time Period recert done 03/01/68 G-code done 18th visit   Authorization - Visit Number 23   Authorization - Number of Visits 28   PT Start Time 1345   PT Stop Time 1428   PT Time Calculation (min) 43 min   Equipment Utilized During Treatment Gait belt   Activity Tolerance Patient tolerated treatment well   Behavior During Therapy [facility] for tasks assessed/performed      Past Medical History  Diagnosis Date  . Migraine   . Depression   . Hypertension   . Stroke 2016  . COPD (chronic obstructive pulmonary disease)   . Anxiety     h/o of panic attack  . GERD (gastroesophageal reflux disease)     occas. use of TUMS  . Arthritis     knees    Past Surgical History  Procedure Laterality Date  . [place] surgery  2010  . Cholecystectomy      age 67  . Appendectomy      age 22  . Eye surgery  years ago    laser to both eyes for blocked tear ducts   . Brain surgery  1993    aneurysm  . Tubal ligation  years ago  . Hemorroidectomy  years ago  . Radiology with anesthesia N/A 01/20/2015    Procedure: RADIOLOGY WITH ANESTHESIA;  Surgeon: Luanne Bras, MD;  Location: [facility];  Service: Radiology;  Laterality: N/A;    There were no vitals filed for this visit.  Visit Diagnosis:  Left-sided weakness  Difficulty walking  Abnormality of gait  Impaired functional mobility and activity tolerance      Subjective Assessment - 07/14/15 1347    Subjective Pt denies any recent falls, reports no  significant change since last session. She and husband report that Medicare called and reported they would give her a brace for her L ankle and both knees as well as some cream for her knees.    Currently in Pain? No/denies   Pain Score 0-No pain                         [facility] Adult PT Treatment/Exercise - 07/14/15 0001    Bed Mobility   Rolling Right 4: Min guard   Rolling Right Details (indicate cue type and reason) 2x10   Rolling Left 4: Min guard  2x10   (see above)    Transfers   Sit to Stand 4: Min guard;Other (comment);Multiple attempts   Sit to Stand Details Tactile cues for weight shifting;Tactile cues for posture;Tactile cues for placement;Verbal cues for technique;Verbal cues for precautions/safety;Verbal cues for safe use of DME/AE;Manual facilitation for placement  Performed 10x c HM on RUE   Knee/Hip Exercises: Standing   Forward Step Up 10 reps;Step Height: 4"   Other Standing Knee Exercises tap ups forward and laterally at 4" step to improve WB,    Other Standing Knee Exercises sidestepping at one side of mat table x  2 RT                  PT Short Term Goals - 06/30/15 1404    PT SHORT TERM GOAL #1   Title Patient will complete functional transfers with min assist in order to decrease burden on caregivers and increase independence with functional mobility.    Baseline Varies from min-max assist depending on level of fatigue   Time 3   Period Weeks   Status On-going   PT SHORT TERM GOAL #2   Title Pt will complete bed mobility with min assist to allow for increased independence.   Baseline Supervision   Time 3   Period Weeks   Status Achieved   PT SHORT TERM GOAL #3   Title Pt will ambulate 15 feet with RW with min assist in order to improve functional mobility and to allow for pt to ambulate in her home.    Time 3   Period Weeks   Status Achieved   PT SHORT TERM GOAL #4   Title Pt will complete five time sit to stand test in 27  seconds to demonstrate improve BLE strength and functional mobility.    Time 2   Period Weeks   Status On-going           PT Long Term Goals - 06/30/15 1740    PT LONG TERM GOAL #1   Title Pt will complete functional transfers and bed mobility with supervision to allow for greater independence with self care.    Time 6   Period Weeks   Status On-going   PT LONG TERM GOAL #2   Title Pt will ambulate 150 feet with RW with supervision to allow for independence with functional mobility and gait.    Time 6   Period Weeks   Status On-going   PT LONG TERM GOAL #3   Title Pt will demonstrate good static standing balance for 10 minutes to demonstrate improved functional activity tolerance and to allow for pt to complete self care.   Time 6   Period Weeks   Status On-going   PT LONG TERM GOAL #4   Title Pt will complete five time sit to stand in 23 seconds to demonstrate improved BLE strength and functional mobliity.    Time 6   Period Weeks   Status On-going               Plan - 07/14/15 1516    Clinical Impression Statement Pt presented to PT stating that she was having a good day functionally, she was able to complete rolling to bilateral sides with CGA today. Forward step ups and sidestepping at mat table were added today. Pt required verbal and tactile cueing during step ups to achieve full knee extension and for proper posture, also required verbal cueing and mod A to complete sidestepping at mat with proper posture and form. Pt declined gait training today, stating that her legs felt too tired after other therex.    PT Next Visit Plan Continue with standing strengthening, gait training, balance training.         Problem List Patient Active Problem List   Diagnosis Date Noted  . Cerebral infarction due to embolism of left carotid artery 03/04/2015  . Aneurysm, cerebral, nonruptured 03/04/2015  . History of ruptured arterial aneurysm 03/04/2015  . Essential  hypertension 03/04/2015  . HLD (hyperlipidemia) 03/04/2015  . Alterations of sensations following CVA (cerebrovascular accident)   . Embolic cerebral infarction  01/22/2015  . Left hemiparesis 01/22/2015  . Stroke   . Cerebral embolism with cerebral infarction 01/21/2015  . Ataxia   . Brain aneurysm 01/20/2015  . TOBACCO ABUSE 01/04/2010  . BRONCHITIS, CHRONIC 01/04/2010  . CERVICAL CANCER 01/03/2010  . HYPERLIPIDEMIA 01/03/2010  . DEPRESSION 01/03/2010  . SUBARACHNOID HEMORRHAGE 01/03/2010  . [name] 01/03/2010    Hilma Favors, PT, DPT [phone] 07/14/2015, 3:27 PM  [facility] [address] [place], [place], [address] Phone: [phone]   Fax:  [phone]

## 2015-07-21 ENCOUNTER — Ambulatory Visit (HOSPITAL_COMMUNITY): Payer: Medicare Other | Attending: Physical Medicine & Rehabilitation | Admitting: Occupational Therapy

## 2015-07-21 ENCOUNTER — Encounter (HOSPITAL_COMMUNITY): Payer: Self-pay | Admitting: Occupational Therapy

## 2015-07-21 ENCOUNTER — Ambulatory Visit (HOSPITAL_COMMUNITY): Payer: Medicare Other | Admitting: Physical Therapy

## 2015-07-21 DIAGNOSIS — G8190 Hemiplegia, unspecified affecting unspecified side: Secondary | ICD-10-CM | POA: Insufficient documentation

## 2015-07-21 DIAGNOSIS — R531 Weakness: Secondary | ICD-10-CM

## 2015-07-21 DIAGNOSIS — IMO0002 Reserved for concepts with insufficient information to code with codable children: Secondary | ICD-10-CM

## 2015-07-21 DIAGNOSIS — Z7409 Other reduced mobility: Secondary | ICD-10-CM | POA: Diagnosis not present

## 2015-07-21 DIAGNOSIS — R269 Unspecified abnormalities of gait and mobility: Secondary | ICD-10-CM | POA: Insufficient documentation

## 2015-07-21 DIAGNOSIS — R279 Unspecified lack of coordination: Secondary | ICD-10-CM | POA: Insufficient documentation

## 2015-07-21 DIAGNOSIS — I6980 Unspecified sequelae of other cerebrovascular disease: Secondary | ICD-10-CM | POA: Diagnosis not present

## 2015-07-21 DIAGNOSIS — R262 Difficulty in walking, not elsewhere classified: Secondary | ICD-10-CM | POA: Insufficient documentation

## 2015-07-21 DIAGNOSIS — Z789 Other specified health status: Secondary | ICD-10-CM

## 2015-07-21 DIAGNOSIS — R29898 Other symptoms and signs involving the musculoskeletal system: Secondary | ICD-10-CM | POA: Diagnosis not present

## 2015-07-21 DIAGNOSIS — M6281 Muscle weakness (generalized): Secondary | ICD-10-CM | POA: Insufficient documentation

## 2015-07-21 DIAGNOSIS — M6289 Other specified disorders of muscle: Secondary | ICD-10-CM | POA: Insufficient documentation

## 2015-07-21 NOTE — Therapy (Signed)
[place], [place], [address] Phone: [phone]   Fax:  [phone]  Occupational Therapy Treatment  Patient Details  Name: Diane Hutchinson MRN: [id number] Date of Birth: [birth year] Referring Provider:  Charlett Blake, MD  Encounter Date: 07/21/2015      OT End of Session - 07/21/15 1530    Visit Number 16   Number of Visits 18   Date for OT Re-Evaluation 08/01/15   Authorization Type Medicare/Medicare A & B   Authorization Time Period Before 20th visit   Authorization - Visit Number 16   Authorization - Number of Visits 20   OT Start Time 1346   OT Stop Time 1431   OT Time Calculation (min) 45 min   Activity Tolerance Patient tolerated treatment well   Behavior During Therapy [facility] for tasks assessed/performed      Past Medical History  Diagnosis Date  . Migraine   . Depression   . Hypertension   . Stroke 2016  . COPD (chronic obstructive pulmonary disease)   . Anxiety     h/o of panic attack  . GERD (gastroesophageal reflux disease)     occas. use of TUMS  . Arthritis     knees    Past Surgical History  Procedure Laterality Date  . [place] surgery  2010  . Cholecystectomy      age 51  . Appendectomy      age 84  . Eye surgery  years ago    laser to both eyes for blocked tear ducts   . Brain surgery  1993    aneurysm  . Tubal ligation  years ago  . Hemorroidectomy  years ago  . Radiology with anesthesia N/A 01/20/2015    Procedure: RADIOLOGY WITH ANESTHESIA;  Surgeon: Luanne Bras, MD;  Location: [facility];  Service: Radiology;  Laterality: N/A;    There were no vitals filed for this visit.  Visit Diagnosis:  Left-sided weakness  Lack of coordination due to stroke  Decreased pinch strength  Decreased grip strength of left hand  Decreased activities of daily living (ADL)      Subjective Assessment - 07/21/15 1346    Subjective  S: I walked today and didn't even wear  that thing. (gait belt)   Currently in Pain? No/denies            [facility] OT Assessment - 07/21/15 1345    Assessment   Diagnosis CVA-left sided weakness   Precautions   Precautions Fall                  OT Treatments/Exercises (OP) - 07/21/15 1346    Exercises   Exercises Shoulder;Hand;Wrist   Wrist Exercises   Forearm Supination Strengthening;15 reps   Bar Weights/Barbell (Forearm Supination) 1 lb   Forearm Pronation Strengthening;15 reps   Bar Weights/Barbell (Forearm Pronation) 1 lb   Wrist Flexion Strengthening;15 reps   Bar Weights/Barbell (Wrist Flexion) 1 lb   Wrist Extension Strengthening;15 reps   Bar Weights/Barbell (Wrist Extension) 1 lb   Wrist Radial Deviation Strengthening;15 reps   Bar Weights/Barbell (Radial Deviation) 1 lb   Wrist Ulnar Deviation Strengthening;15 reps   Bar Weights/Barbell (Ulnar Deviation) 1 lb   Fine Motor Coordination   Small Pegboard Pt participated in small pegboard activity, using right hand to place small pegs into left hand. Pt was instructed to use two point pinch with thumb and index finger or thumb and middle finger to place  pegs. Pt placed 5 small pegs with mod-max difficulty. Pt continues to have difficulty maintaining 2 pt pinch on the peg, as she puts too much pressure on the pegs, causing them to snap out of her fingers or her pinch becomes lateral versus two-point.    Additional Wrist Exercises   Hand Gripper with Large Beads 6/6 beads gripper set at 11#                                     OT Short Term Goals - 06/30/15 1612    OT SHORT TERM GOAL #1   Title Pt will be educated on and independent in HEP.    Time 4   Period Weeks   Status On-going   OT SHORT TERM GOAL #2   Title Pt will increase LUE strength to 4/5 to increase ability to assist in donning shirts.    Time 4   Period Weeks   Status On-going   OT SHORT TERM GOAL #3   Title Patient will increase grip strength by 5# and pinch  strength by 3# to increase ability to hold coffee cup.    Time 4   Period Weeks   Status On-going   OT SHORT TERM GOAL #4   Title Patient will increase fine motor coordination by completing 9 hole peg test within 2 minutes.    Time 4   Period Weeks   Status On-going           OT Long Term Goals - 06/04/15 1506    OT LONG TERM GOAL #1   Title Pt will complete all BADL tasks at Mod I level, including dressing, bathing, and meal preparation.    Time 8   Period Weeks   Status On-going   OT LONG TERM GOAL #2   Title Pt will increase LUE strength to 4+/5 to increase ability to complete daily tasks.    Time 8   Period Weeks   Status On-going   OT LONG TERM GOAL #3   Title Patient will increase grip strength by 10# and pinch strength by 5# to increase ability to grasp walker or surfaces during transfers and functional mobility tasks.    Time 8   Period Weeks   Status On-going   OT LONG TERM GOAL #4   Title Pt will increase fine motor coordination by completing 9 hole peg test in under 1 minute.    Time 8   Period Weeks   Status On-going               Plan - 07/21/15 1530    Clinical Impression Statement A: Pt participated in wrist strengthening exercises and fine motor coordination this session. Pt demonstrates improved ability to isolate and control wrist during flexion and extension, however continues to demonstrate difficulty with radial/ulnar deviation. Pt requires mod verbal cuing to use vision to compensate for decreased sensation during fine motor tasks.    Plan P: continue working to increase use of vision during fine motor activities to increase independence and function of LUE; Resume UB strengthening        Problem List Patient Active Problem List   Diagnosis Date Noted  . Cerebral infarction due to embolism of left carotid artery 03/04/2015  . Aneurysm, cerebral, nonruptured 03/04/2015  . History of ruptured arterial aneurysm 03/04/2015  . Essential  hypertension 03/04/2015  . HLD (hyperlipidemia) 03/04/2015  . Alterations  of sensations following CVA (cerebrovascular accident)   . Embolic cerebral infarction 01/22/2015  . Left hemiparesis 01/22/2015  . Stroke   . Cerebral embolism with cerebral infarction 01/21/2015  . Ataxia   . Brain aneurysm 01/20/2015  . TOBACCO ABUSE 01/04/2010  . BRONCHITIS, CHRONIC 01/04/2010  . CERVICAL CANCER 01/03/2010  . HYPERLIPIDEMIA 01/03/2010  . DEPRESSION 01/03/2010  . SUBARACHNOID HEMORRHAGE 01/03/2010  . [name] 01/03/2010    Guadelupe Sabin, OTR/L  [phone]  07/21/2015, 3:34 PM  [facility] [address] [place], [place], [address] Phone: [phone]   Fax:  [phone]

## 2015-07-21 NOTE — Therapy (Signed)
[place], [place], [address] Phone: [phone]   Fax:  [phone]  Physical Therapy Treatment  Patient Details  Name: Diane Hutchinson MRN: [id number] Date of Birth: Apr 04, 1946 Referring Provider:  Charlett Blake, MD  Encounter Date: 07/21/2015      PT End of Session - 07/21/15 1520    Visit Number 24   Number of Visits 26   Date for PT Re-Evaluation 07/28/15   Authorization Type Medicare   Authorization Time Period recert done 2/69/48 G-code done 18th visit   Authorization - Visit Number 24   Authorization - Number of Visits 28   PT Start Time 1300   PT Stop Time 1344   PT Time Calculation (min) 44 min   Equipment Utilized During Treatment Gait belt   Activity Tolerance Patient tolerated treatment well   Behavior During Therapy [facility] for tasks assessed/performed      Past Medical History  Diagnosis Date  . Migraine   . Depression   . Hypertension   . Stroke 2016  . COPD (chronic obstructive pulmonary disease)   . Anxiety     h/o of panic attack  . GERD (gastroesophageal reflux disease)     occas. use of TUMS  . Arthritis     knees    Past Surgical History  Procedure Laterality Date  . [place] surgery  2010  . Cholecystectomy      age 68  . Appendectomy      age 8  . Eye surgery  years ago    laser to both eyes for blocked tear ducts   . Brain surgery  1993    aneurysm  . Tubal ligation  years ago  . Hemorroidectomy  years ago  . Radiology with anesthesia N/A 01/20/2015    Procedure: RADIOLOGY WITH ANESTHESIA;  Surgeon: Luanne Bras, MD;  Location: [facility];  Service: Radiology;  Laterality: N/A;    There were no vitals filed for this visit.  Visit Diagnosis:  Left-sided weakness  Difficulty walking  Abnormality of gait  Impaired functional mobility and activity tolerance      Subjective Assessment - 07/21/15 1304    Subjective Pt denies any recent falls, denies  having any pain today. She presents to PT without wearing ankle or knee brace, reporting that her leg has been feeling better.    Currently in Pain? No/denies   Pain Score 0-No pain                         [facility] Adult PT Treatment/Exercise - 07/21/15 0001    Bed Mobility   Rolling Right 4: Min guard   Rolling Right Details (indicate cue type and reason) x10   Ambulation/Gait   Ambulation/Gait Assistance 4: Min assist   Assistive device Hemi-walker;Small based quad cane  x25' with hemiwalker, x15' with [facility]   Lumbar Exercises: Supine   Bridge 10 reps   Knee/Hip Exercises: Standing   Other Standing Knee Exercises standing tolerance and static balance training x 90" with one UE support, x 30" without UE support             Balance Exercises - 07/21/15 1338    Balance Exercises: Standing   Step Ups 4 inch  tap ups   Cone Rotation Upper extremity assist 1;Right turn  x10 with LUE support, x10 no UE support             PT  Short Term Goals - 06/30/15 1404    PT SHORT TERM GOAL #1   Title Patient will complete functional transfers with min assist in order to decrease burden on caregivers and increase independence with functional mobility.    Baseline Varies from min-max assist depending on level of fatigue   Time 3   Period Weeks   Status On-going   PT SHORT TERM GOAL #2   Title Pt will complete bed mobility with min assist to allow for increased independence.   Baseline Supervision   Time 3   Period Weeks   Status Achieved   PT SHORT TERM GOAL #3   Title Pt will ambulate 15 feet with RW with min assist in order to improve functional mobility and to allow for pt to ambulate in her home.    Time 3   Period Weeks   Status Achieved   PT SHORT TERM GOAL #4   Title Pt will complete five time sit to stand test in 27 seconds to demonstrate improve BLE strength and functional mobility.    Time 2   Period Weeks   Status On-going           PT Long  Term Goals - 06/30/15 1740    PT LONG TERM GOAL #1   Title Pt will complete functional transfers and bed mobility with supervision to allow for greater independence with self care.    Time 6   Period Weeks   Status On-going   PT LONG TERM GOAL #2   Title Pt will ambulate 150 feet with RW with supervision to allow for independence with functional mobility and gait.    Time 6   Period Weeks   Status On-going   PT LONG TERM GOAL #3   Title Pt will demonstrate good static standing balance for 10 minutes to demonstrate improved functional activity tolerance and to allow for pt to complete self care.   Time 6   Period Weeks   Status On-going   PT LONG TERM GOAL #4   Title Pt will complete five time sit to stand in 23 seconds to demonstrate improved BLE strength and functional mobliity.    Time 6   Period Weeks   Status On-going               Plan - 07/21/15 1521    Clinical Impression Statement Functional strengthening, balance training, and gait training were continued today. Pt required frequent verbal cueing during standing therex to maintain proper posture and knee extension. Pt was able to complete cone rotation exercise without UE support with CGA/min A from PT to maintain balance. Pt has been having difficulty with navigating hemiwalker, requiring cues for proper placement, reporting that she feels that it is too awkward to manage. Small based quad cane was trialed today to determine if pt had less difficulty with this AD. Pt  was able to complete proper sequencing, however, she was only able to ambulate 15' before fatiguing.    PT Next Visit Plan Continue with trial of SBQC to determine if pt is able to negotiate this AD more successfully, continue with standing balance training and strengthening        Problem List Patient Active Problem List   Diagnosis Date Noted  . Cerebral infarction due to embolism of left carotid artery 03/04/2015  . Aneurysm, cerebral, nonruptured  03/04/2015  . History of ruptured arterial aneurysm 03/04/2015  . Essential hypertension 03/04/2015  . HLD (hyperlipidemia) 03/04/2015  . Alterations  of sensations following CVA (cerebrovascular accident)   . Embolic cerebral infarction 01/22/2015  . Left hemiparesis 01/22/2015  . Stroke   . Cerebral embolism with cerebral infarction 01/21/2015  . Ataxia   . Brain aneurysm 01/20/2015  . TOBACCO ABUSE 01/04/2010  . BRONCHITIS, CHRONIC 01/04/2010  . CERVICAL CANCER 01/03/2010  . HYPERLIPIDEMIA 01/03/2010  . DEPRESSION 01/03/2010  . SUBARACHNOID HEMORRHAGE 01/03/2010  . [name] 01/03/2010    Hilma Favors, PT, DPT [phone] 07/21/2015, 3:27 PM  [facility] [address] [place], [place], [address] Phone: [phone]   Fax:  [phone]

## 2015-07-23 ENCOUNTER — Ambulatory Visit (HOSPITAL_COMMUNITY): Payer: Medicare Other | Admitting: Occupational Therapy

## 2015-07-23 ENCOUNTER — Ambulatory Visit (HOSPITAL_COMMUNITY): Payer: Medicare Other | Admitting: Physical Therapy

## 2015-07-23 ENCOUNTER — Encounter (HOSPITAL_COMMUNITY): Payer: Self-pay | Admitting: Occupational Therapy

## 2015-07-23 DIAGNOSIS — Z789 Other specified health status: Secondary | ICD-10-CM

## 2015-07-23 DIAGNOSIS — R531 Weakness: Secondary | ICD-10-CM

## 2015-07-23 DIAGNOSIS — IMO0002 Reserved for concepts with insufficient information to code with codable children: Secondary | ICD-10-CM

## 2015-07-23 DIAGNOSIS — Z7409 Other reduced mobility: Secondary | ICD-10-CM

## 2015-07-23 DIAGNOSIS — M6289 Other specified disorders of muscle: Secondary | ICD-10-CM | POA: Diagnosis not present

## 2015-07-23 DIAGNOSIS — R29898 Other symptoms and signs involving the musculoskeletal system: Secondary | ICD-10-CM

## 2015-07-23 DIAGNOSIS — R262 Difficulty in walking, not elsewhere classified: Secondary | ICD-10-CM

## 2015-07-23 DIAGNOSIS — I6980 Unspecified sequelae of other cerebrovascular disease: Secondary | ICD-10-CM | POA: Diagnosis not present

## 2015-07-23 DIAGNOSIS — R269 Unspecified abnormalities of gait and mobility: Secondary | ICD-10-CM

## 2015-07-23 DIAGNOSIS — R279 Unspecified lack of coordination: Secondary | ICD-10-CM | POA: Diagnosis not present

## 2015-07-23 DIAGNOSIS — M6281 Muscle weakness (generalized): Secondary | ICD-10-CM | POA: Diagnosis not present

## 2015-07-23 NOTE — Therapy (Signed)
[place], [place], [address] Phone: [phone]   Fax:  [phone]  Occupational Therapy Treatment  Patient Details  Name: Diane Hutchinson MRN: [id number] Date of Birth: 01-19-1946 Referring Provider:  Neale Burly, MD  Encounter Date: 07/23/2015      OT End of Session - 07/23/15 1521    Visit Number 17   Number of Visits 18   Date for OT Re-Evaluation 08/01/15   Authorization Type Medicare/Medicare A & B   Authorization Time Period Before 20th visit   Authorization - Visit Number 17   Authorization - Number of Visits 20   OT Start Time 1301   OT Stop Time 1346   OT Time Calculation (min) 45 min   Activity Tolerance Patient tolerated treatment well   Behavior During Therapy [facility] for tasks assessed/performed      Past Medical History  Diagnosis Date  . Migraine   . Depression   . Hypertension   . Stroke 2016  . COPD (chronic obstructive pulmonary disease)   . Anxiety     h/o of panic attack  . GERD (gastroesophageal reflux disease)     occas. use of TUMS  . Arthritis     knees    Past Surgical History  Procedure Laterality Date  . [place] surgery  2010  . Cholecystectomy      age 69  . Appendectomy      age 69  . Eye surgery  years ago    laser to both eyes for blocked tear ducts   . Brain surgery  1993    aneurysm  . Tubal ligation  years ago  . Hemorroidectomy  years ago  . Radiology with anesthesia N/A 01/20/2015    Procedure: RADIOLOGY WITH ANESTHESIA;  Surgeon: Luanne Bras, MD;  Location: [place];  Service: Radiology;  Laterality: N/A;    There were no vitals filed for this visit.  Visit Diagnosis:  Left-sided weakness  Lack of coordination due to stroke  Decreased pinch strength  Decreased grip strength of left hand      Subjective Assessment - 07/23/15 1516    Subjective  S: I've had a rough morning today.    Currently in Pain? No/denies            [facility]  OT Assessment - 07/23/15 1254    Assessment   Diagnosis CVA-left sided weakness   Precautions   Precautions Fall                  OT Treatments/Exercises (OP) - 07/23/15 1337    Exercises   Exercises Shoulder;Hand;Wrist   Shoulder Exercises: Seated   Protraction Strengthening;15 reps   Protraction Weight (lbs) 1  wrist weight   Horizontal ABduction Strengthening;15 reps   Horizontal ABduction Weight (lbs) 1  wrist weight   Other Seated Exercises shoulder press, 15X, 1# wrist weight   Shoulder Exercises: Stretch   Elbow Flexion Strengthening;15 reps  1# wrist weight   Elbow Exercises   Other elbow exercises elbow extension, 15X 1# wrist weight   Fine Motor Coordination   Other Fine Motor Exercises Pt participated in lacing task using dog lacing board. Pt used left hand to grasp shoe lace and thread through holes around the outside of a dog shape. Pt mad mod-max difficulty with task, was able to complete 20/46 holes. Pt used left hand to thread lace and right hand to pull through hole.  OT Short Term Goals - 06/30/15 1612    OT SHORT TERM GOAL #1   Title Pt will be educated on and independent in HEP.    Time 4   Period Weeks   Status On-going   OT SHORT TERM GOAL #2   Title Pt will increase LUE strength to 4/5 to increase ability to assist in donning shirts.    Time 4   Period Weeks   Status On-going   OT SHORT TERM GOAL #3   Title Patient will increase grip strength by 5# and pinch strength by 3# to increase ability to hold coffee cup.    Time 4   Period Weeks   Status On-going   OT SHORT TERM GOAL #4   Title Patient will increase fine motor coordination by completing 9 hole peg test within 2 minutes.    Time 4   Period Weeks   Status On-going           OT Long Term Goals - 06/04/15 1506    OT LONG TERM GOAL #1   Title Pt will complete all BADL tasks at Mod I level, including dressing, bathing, and meal preparation.     Time 8   Period Weeks   Status On-going   OT LONG TERM GOAL #2   Title Pt will increase LUE strength to 4+/5 to increase ability to complete daily tasks.    Time 8   Period Weeks   Status On-going   OT LONG TERM GOAL #3   Title Patient will increase grip strength by 10# and pinch strength by 5# to increase ability to grasp walker or surfaces during transfers and functional mobility tasks.    Time 8   Period Weeks   Status On-going   OT LONG TERM GOAL #4   Title Pt will increase fine motor coordination by completing 9 hole peg test in under 1 minute.    Time 8   Period Weeks   Status On-going               Plan - 07/23/15 1522    Clinical Impression Statement A: Pt participated in lacing fine motor activity and UB strengthening this session. Pt demonstrates improved gross motor coordination during UB strengthening, with min verbal cuing to tighten arm muscles to smooth descent.    Plan P: Finish lacing activity. Cont UB, grip and pinch strengthening. Cont to focus on visual cuing to enhance ability to use LUE during functional activities.         Problem List Patient Active Problem List   Diagnosis Date Noted  . Cerebral infarction due to embolism of left carotid artery 03/04/2015  . Aneurysm, cerebral, nonruptured 03/04/2015  . History of ruptured arterial aneurysm 03/04/2015  . Essential hypertension 03/04/2015  . HLD (hyperlipidemia) 03/04/2015  . Alterations of sensations following CVA (cerebrovascular accident)   . Embolic cerebral infarction 01/22/2015  . Left hemiparesis 01/22/2015  . Stroke   . Cerebral embolism with cerebral infarction 01/21/2015  . Ataxia   . Brain aneurysm 01/20/2015  . TOBACCO ABUSE 01/04/2010  . BRONCHITIS, CHRONIC 01/04/2010  . CERVICAL CANCER 01/03/2010  . HYPERLIPIDEMIA 01/03/2010  . DEPRESSION 01/03/2010  . SUBARACHNOID HEMORRHAGE 01/03/2010  . [name] 01/03/2010    Guadelupe Sabin, OTR/L  [phone]  07/23/2015, 3:25  PM  [facility] [address] [place], [place], [address] Phone: [phone]   Fax:  [phone]

## 2015-07-23 NOTE — Therapy (Signed)
[place], [place], [address] Phone: [phone]   Fax:  [phone]  Physical Therapy Treatment  Patient Details  Name: Diane Hutchinson MRN: [id number] Date of Birth: 1946/07/07 Referring Provider:  Neale Burly, MD  Encounter Date: 07/23/2015      PT End of Session - 07/23/15 1615    Visit Number 25   Number of Visits 26   Date for PT Re-Evaluation 07/28/15   Authorization Type Medicare   Authorization Time Period recert done 02/01/01 G-code done 18th visit   Authorization - Visit Number 25   Authorization - Number of Visits 26   PT Start Time 1350   PT Stop Time 1430   PT Time Calculation (min) 40 min   Equipment Utilized During Treatment Gait belt   Activity Tolerance Patient tolerated treatment well   Behavior During Therapy [facility] for tasks assessed/performed      Past Medical History  Diagnosis Date  . Migraine   . Depression   . Hypertension   . Stroke 2016  . COPD (chronic obstructive pulmonary disease)   . Anxiety     h/o of panic attack  . GERD (gastroesophageal reflux disease)     occas. use of TUMS  . Arthritis     knees    Past Surgical History  Procedure Laterality Date  . [place] surgery  2010  . Cholecystectomy      age 69  . Appendectomy      age 69  . Eye surgery  years ago    laser to both eyes for blocked tear ducts   . Brain surgery  1993    aneurysm  . Tubal ligation  years ago  . Hemorroidectomy  years ago  . Radiology with anesthesia N/A 01/20/2015    Procedure: RADIOLOGY WITH ANESTHESIA;  Surgeon: Luanne Bras, MD;  Location: [place];  Service: Radiology;  Laterality: N/A;    There were no vitals filed for this visit.  Visit Diagnosis:  Left-sided weakness  Lack of coordination due to stroke  Difficulty walking  Abnormality of gait  Impaired functional mobility and activity tolerance  Decreased activities of daily living (ADL)      Subjective  Assessment - 07/23/15 1355    Subjective Pt has no questions on her HEP;    Currently in Pain? No/denies             [facility] Adult PT Treatment/Exercise - 07/23/15 1408    Transfers   Sit to Stand 4: Min guard   Ambulation/Gait   Ambulation/Gait Assistance 4: Min guard   Ambulation Distance (Feet) 120 Feet   Assistive device Small based quad cane   Lumbar Exercises: Standing   Other Standing Lumbar Exercises weight shifting activity /marching   Lumbar Exercises: Seated   Sit to Stand 10 reps   Lumbar Exercises: Supine   Bridge 15 reps   Bridge Limitations with adduction    Lumbar Exercises: Sidelying   Other Sidelying Lumbar Exercises knee flexion 7# x12   Shoulder Exercises: Seated                                      PT Short Term Goals - 06/30/15 1404    PT SHORT TERM GOAL #1   Title Patient will complete functional transfers with min assist in order to decrease burden on caregivers and increase independence  with functional mobility.    Baseline Varies from min-max assist depending on level of fatigue   Time 3   Period Weeks   Status On-going   PT SHORT TERM GOAL #2   Title Pt will complete bed mobility with min assist to allow for increased independence.   Baseline Supervision   Time 3   Period Weeks   Status Achieved   PT SHORT TERM GOAL #3   Title Pt will ambulate 15 feet with RW with min assist in order to improve functional mobility and to allow for pt to ambulate in her home.    Time 3   Period Weeks   Status Achieved   PT SHORT TERM GOAL #4   Title Pt will complete five time sit to stand test in 27 seconds to demonstrate improve BLE strength and functional mobility.    Time 2   Period Weeks   Status On-going           PT Long Term Goals - 06/30/15 1740    PT LONG TERM GOAL #1   Title Pt will complete functional transfers and bed mobility with supervision to allow for greater independence with self care.    Time 6   Period Weeks    Status On-going   PT LONG TERM GOAL #2   Title Pt will ambulate 150 feet with RW with supervision to allow for independence with functional mobility and gait.    Time 6   Period Weeks   Status On-going   PT LONG TERM GOAL #3   Title Pt will demonstrate good static standing balance for 10 minutes to demonstrate improved functional activity tolerance and to allow for pt to complete self care.   Time 6   Period Weeks   Status On-going   PT LONG TERM GOAL #4   Title Pt will complete five time sit to stand in 23 seconds to demonstrate improved BLE strength and functional mobliity.    Time 6   Period Weeks   Status On-going               Plan - 07/23/15 1617    Clinical Impression Statement Pt did not remember ambulating with the quad cane.  Pt husband states he is afraid to walk with pt in the house due to not being able to hold onto the pt and pull the wheelchair behind his wife at the same time.  Therapist suggested putting kitchen chairs every 5-10 ft so that pt would have a chair nearby if she felt fatigued and needed to rest.  PT was able to ambulate quite a bit longer with the quad cane today.    PT Next Visit Plan reassess to see if skilled therapy is to continue.         Problem List Patient Active Problem List   Diagnosis Date Noted  . Cerebral infarction due to embolism of left carotid artery 03/04/2015  . Aneurysm, cerebral, nonruptured 03/04/2015  . History of ruptured arterial aneurysm 03/04/2015  . Essential hypertension 03/04/2015  . HLD (hyperlipidemia) 03/04/2015  . Alterations of sensations following CVA (cerebrovascular accident)   . Embolic cerebral infarction 01/22/2015  . Left hemiparesis 01/22/2015  . Stroke   . Cerebral embolism with cerebral infarction 01/21/2015  . Ataxia   . Brain aneurysm 01/20/2015  . TOBACCO ABUSE 01/04/2010  . BRONCHITIS, CHRONIC 01/04/2010  . CERVICAL CANCER 01/03/2010  . HYPERLIPIDEMIA 01/03/2010  . DEPRESSION  01/03/2010  . SUBARACHNOID HEMORRHAGE 01/03/2010  .  [name] 01/03/2010    Rayetta Humphrey, PT CLT [phone] 07/23/2015, 4:21 PM  [place] [address] [place], [place], [address] Phone: [phone]   Fax:  [phone]

## 2015-07-26 ENCOUNTER — Ambulatory Visit (HOSPITAL_COMMUNITY): Payer: Medicare Other | Admitting: Physical Therapy

## 2015-07-26 DIAGNOSIS — R279 Unspecified lack of coordination: Secondary | ICD-10-CM | POA: Diagnosis not present

## 2015-07-26 DIAGNOSIS — M6289 Other specified disorders of muscle: Secondary | ICD-10-CM | POA: Diagnosis not present

## 2015-07-26 DIAGNOSIS — R531 Weakness: Secondary | ICD-10-CM

## 2015-07-26 DIAGNOSIS — R262 Difficulty in walking, not elsewhere classified: Secondary | ICD-10-CM

## 2015-07-26 DIAGNOSIS — Z789 Other specified health status: Secondary | ICD-10-CM | POA: Diagnosis not present

## 2015-07-26 DIAGNOSIS — I6980 Unspecified sequelae of other cerebrovascular disease: Secondary | ICD-10-CM | POA: Diagnosis not present

## 2015-07-26 DIAGNOSIS — R269 Unspecified abnormalities of gait and mobility: Secondary | ICD-10-CM

## 2015-07-26 DIAGNOSIS — Z7409 Other reduced mobility: Secondary | ICD-10-CM

## 2015-07-26 DIAGNOSIS — R29898 Other symptoms and signs involving the musculoskeletal system: Secondary | ICD-10-CM | POA: Diagnosis not present

## 2015-07-26 DIAGNOSIS — M6281 Muscle weakness (generalized): Secondary | ICD-10-CM | POA: Diagnosis not present

## 2015-07-26 NOTE — Therapy (Signed)
[place], [place], [address] Phone: [phone]   Fax:  [phone]  Physical Therapy Treatment (Discharge)  Patient Details  Name: Diane Hutchinson MRN: [id number] Date of Birth: [birth year] Referring Provider:  Neale Burly, MD  Encounter Date: 07/26/2015      PT End of Session - 07/26/15 1614    Visit Number 26   Number of Visits 26   Authorization Type Medicare   Authorization Time Period recert done 6/38/93 G-code done 18th visit   Authorization - Visit Number 26   Authorization - Number of Visits 26   PT Start Time 1346   PT Stop Time 1430   PT Time Calculation (min) 44 min   Equipment Utilized During Treatment Gait belt   Activity Tolerance Patient tolerated treatment well   Behavior During Therapy [facility] for tasks assessed/performed      Past Medical History  Diagnosis Date  . Migraine   . Depression   . Hypertension   . Stroke 2016  . COPD (chronic obstructive pulmonary disease)   . Anxiety     h/o of panic attack  . GERD (gastroesophageal reflux disease)     occas. use of TUMS  . Arthritis     knees    Past Surgical History  Procedure Laterality Date  . [place] surgery  2010  . Cholecystectomy      age 45  . Appendectomy      age 34  . Eye surgery  years ago    laser to both eyes for blocked tear ducts   . Brain surgery  1993    aneurysm  . Tubal ligation  years ago  . Hemorroidectomy  years ago  . Radiology with anesthesia N/A 01/20/2015    Procedure: RADIOLOGY WITH ANESTHESIA;  Surgeon: Luanne Bras, MD;  Location: [place];  Service: Radiology;  Laterality: N/A;    There were no vitals filed for this visit.  Visit Diagnosis:  Left-sided weakness  Difficulty walking  Abnormality of gait  Impaired functional mobility and activity tolerance      Subjective Assessment - 07/26/15 1349    Subjective Pt reports that she has been feeling pretty good overall. She  reports that her leg feels like it has gotten stronger, and that her walking has improved. She believes that her biggest problem now is her left arm.    How long can you sit comfortably? no limitations   How long can you stand comfortably? 10 minutes   How long can you walk comfortably? 5-15 minutes   Currently in Pain? No/denies   Pain Score 0-No pain            [facility] PT Assessment - 07/26/15 0001    Strength   Right Hip Flexion 4+/5   Right Hip Extension 4-/5   Right Hip ABduction 4/5   Left Hip Flexion 4-/5   Left Hip Extension 3-/5   Left Hip ABduction 3+/5   Right Knee Extension 5/5   Left Knee Extension 3+/5   Bed Mobility   Rolling Right 5: Supervision   Rolling Left 5: Supervision   Supine to Sit 6: Modified independent (Device/Increase time)   Sit to Supine 5: Supervision   Transfers   Sit to Stand 5: Supervision   Sit to Stand Details (indicate cue type and reason) able to stand with supervision on second attempt   Ambulation/Gait   Ambulation/Gait Assistance 4: Min assist   Ambulation Distance (Feet)  20 Feet   Assistive device Small based quad cane               PT Education - 07/26/15 1614    Education provided Yes   Education Details Goals reviewed, progress discussed with patient   Person(s) Educated Patient;Spouse   Methods Explanation   Comprehension Verbalized understanding          PT Short Term Goals - 07/26/15 1410    PT SHORT TERM GOAL #1   Title Pt will complete functional transfers with min assist to decrease caregiver burden and increase independence with functional mobility.    Baseline Now supervision-min A depending on level of fatigue   Time 3   Period Weeks   Status Achieved   PT SHORT TERM GOAL #2   Title Pt will complete bed mobility with min assist to allow for increased independence.   Baseline Supervision   Time 3   Period Weeks   Status Achieved   PT SHORT TERM GOAL #3   Title Pt will ambulate 15 feet with RW with  min assist in order to improve functional mobility and to allow for pt to ambulate in her home.    Time 3   Period Weeks   Status Achieved   PT SHORT TERM GOAL #4   Title Pt will complete five time sit to stand test in 27 seconds to demonstrate improve BLE strength and functional mobility.    Baseline 9/12: completed in 29" with one UE support   Time 2   Period Weeks   Status Not Met           PT Long Term Goals - 07/26/15 1621    PT LONG TERM GOAL #1   Title Pt will complete functional transfers and bed mobility with supervision to allow for greater independence with self care.    Time 6   Period Weeks   Status Partially Met   PT LONG TERM GOAL #2   Title Pt will ambulate 150 feet with RW with supervision to allow for independence with functional mobility and gait.    Time 6   Period Weeks   Status Not Met   PT LONG TERM GOAL #3   Title Pt will demonstrate good static standing balance for 10 minutes to demonstrate improved functional activity tolerance and to allow for pt to complete self care.   Time 6   Period Weeks   Status Not Met   PT LONG TERM GOAL #4   Title Pt will complete five time sit to stand in 23 seconds to demonstrate improved BLE strength and functional mobliity.    Time 6   Period Weeks   Status Not Met               Plan - 07/26/15 1615    Clinical Impression Statement Reassessment was completed today. Pt has made steady progress toward her LTGs that were set for transfers and bed mobility, however, pt's progress in regards to LE strength and gait has been sporadic, with pt having days where she can ambulate up to 150 feet at a time and other days where she is able to ambulate only 15 feet before fatiguing. Pt has plateaued in regards to gait and strength. She and her husband report that pt is compliant with HEP, and husband has been walking with pt in the home. Pt agrees to d/c, and she is being d/c from skilled services at this time to continue  with  HEP due to lack of progress.    PT Treatment/Interventions ADLs/Self Care Home Management;Gait training;Functional mobility training;Therapeutic activities;Therapeutic exercise;Balance training;Neuromuscular re-education;Patient/family education;Manual techniques          G-Codes - 2015/08/07 1622    Functional Assessment Tool Used FOTO   Functional Limitation Mobility: Walking and moving around   Mobility: Walking and Moving Around Goal Status [phone]) At least 60 percent but less than 80 percent impaired, limited or restricted   Mobility: Walking and Moving Around Discharge Status [phone]) At least 60 percent but less than 80 percent impaired, limited or restricted      Problem List Patient Active Problem List   Diagnosis Date Noted  . Cerebral infarction due to embolism of left carotid artery 03/04/2015  . Aneurysm, cerebral, nonruptured 03/04/2015  . History of ruptured arterial aneurysm 03/04/2015  . Essential hypertension 03/04/2015  . HLD (hyperlipidemia) 03/04/2015  . Alterations of sensations following CVA (cerebrovascular accident)   . Embolic cerebral infarction 01/22/2015  . Left hemiparesis 01/22/2015  . Stroke   . Cerebral embolism with cerebral infarction 01/21/2015  . Ataxia   . Brain aneurysm 01/20/2015  . TOBACCO ABUSE 01/04/2010  . BRONCHITIS, CHRONIC 01/04/2010  . CERVICAL CANCER 01/03/2010  . HYPERLIPIDEMIA 01/03/2010  . DEPRESSION 01/03/2010  . SUBARACHNOID HEMORRHAGE 01/03/2010  . C O P D 01/03/2010     PHYSICAL THERAPY DISCHARGE SUMMARY  Visits from Start of Care: 26  Current functional level related to goals / functional outcomes: Pt has demonstrated improvements in transfers and bed mobility, and is able to complete these tasks with supervision-min assist depending on level of fatigue.   Remaining deficits: Pt continues to demonstrate decreased LLE strength, decreased functional activity tolerance, and impaired gait mechanics.   Education /  Equipment: HEP  Plan: Patient agrees to discharge.  Patient goals were partially met. Patient is being discharged due to lack of progress.  ?????         Hilma Favors, PT, DPT [phone] 07-Aug-2015, 4:24 PM  [facility] [address] [place], [place], [address] Phone: [phone]   Fax:  [phone]

## 2015-07-27 ENCOUNTER — Encounter (HOSPITAL_COMMUNITY): Payer: Medicare Other | Admitting: Physical Therapy

## 2015-07-27 ENCOUNTER — Encounter (HOSPITAL_COMMUNITY): Payer: Medicare Other | Admitting: Occupational Therapy

## 2015-07-27 DIAGNOSIS — H2513 Age-related nuclear cataract, bilateral: Secondary | ICD-10-CM | POA: Diagnosis not present

## 2015-07-27 DIAGNOSIS — H2512 Age-related nuclear cataract, left eye: Secondary | ICD-10-CM | POA: Diagnosis not present

## 2015-07-29 ENCOUNTER — Ambulatory Visit (HOSPITAL_COMMUNITY): Payer: Medicare Other | Admitting: Occupational Therapy

## 2015-07-29 ENCOUNTER — Encounter (HOSPITAL_COMMUNITY): Payer: Self-pay | Admitting: Occupational Therapy

## 2015-07-29 ENCOUNTER — Encounter (HOSPITAL_COMMUNITY): Payer: Medicare Other

## 2015-07-29 DIAGNOSIS — R29898 Other symptoms and signs involving the musculoskeletal system: Secondary | ICD-10-CM

## 2015-07-29 DIAGNOSIS — I6980 Unspecified sequelae of other cerebrovascular disease: Secondary | ICD-10-CM | POA: Diagnosis not present

## 2015-07-29 DIAGNOSIS — G8194 Hemiplegia, unspecified affecting left nondominant side: Secondary | ICD-10-CM

## 2015-07-29 DIAGNOSIS — M6289 Other specified disorders of muscle: Secondary | ICD-10-CM | POA: Diagnosis not present

## 2015-07-29 DIAGNOSIS — R531 Weakness: Secondary | ICD-10-CM

## 2015-07-29 DIAGNOSIS — IMO0002 Reserved for concepts with insufficient information to code with codable children: Secondary | ICD-10-CM

## 2015-07-29 DIAGNOSIS — R279 Unspecified lack of coordination: Secondary | ICD-10-CM | POA: Diagnosis not present

## 2015-07-29 DIAGNOSIS — Z789 Other specified health status: Secondary | ICD-10-CM | POA: Diagnosis not present

## 2015-07-29 DIAGNOSIS — M6281 Muscle weakness (generalized): Secondary | ICD-10-CM | POA: Diagnosis not present

## 2015-07-29 NOTE — Patient Instructions (Signed)
1) Shoulder Protraction    Begin with elbows by your side, slowly "punch" straight out in front of you keeping arms/elbows straight. Repeat _10-15__times, __1__set/day.     2) Shoulder Flexion  Supine:     Standing:         Begin with arms at your side with thumbs pointed up, slowly raise both arms up and forward towards overhead. Repeat_10-15__times, _1__set/day.               3) Horizontal abduction/adduction  Supine:   Standing:           Begin with arms straight out in front of you, bring out to the side in at "T" shape. Keep arms straight entire time. Repeat _10-15___times, __1__sets/day.                 4) Internal & External Rotation    *No band* -Stand with elbows at the side and elbows bent 90 degrees. Move your forearms away from your body, then bring back inward toward the body.  Repeat _10-15__times, __1_sets/day    5) Shoulder Abduction  Supine:     Standing:       Lying on your back begin with your arms flat on the table next to your side. Slowly move your arms out to the side so that they go overhead, in a jumping jack or snow angel movement. Repeat _10-15__times, _1__sets/day          Strengthening Exercises  1) WRIST EXTENSION CURLS - TABLE  Hold a small free weight, rest your forearm on a table and bend your wrist up and down with your palm face down as shown.      2) WRIST FLEXION CURLS - TABLE  Hold a small free weight, rest your forearm on a table and bend your wrist up and down with your palm face up as shown.     3) FREE WEIGHT RADIAL/ULNAR DEVIATION - TABLE  Hold a small free weight, rest your forearm on a table and bend your wrist up and down with your palm facing towards the side as shown.     4) Pronation  Forearm supported on table with wrist in neutral position. Using a weight, roll wrist so that palm faces downward. Hold for 2 seconds and return to starting position.     5)  Supination  Forearm supported on table with wrist in neutral position. Using a weight, roll wrist so that palm is now facing upward. Hold for 2 seconds and return to starting position.      *Complete exercises using __2__ pound weight, __10-15__times each, __1__times per day*

## 2015-07-29 NOTE — Therapy (Signed)
[place], [place], [address] Phone: [phone]   Fax:  [phone]  Occupational Therapy Reassessment & Discharge  Patient Details  Name: Diane Hutchinson MRN: [id number] Date of Birth: May 20, 1946 Referring Provider:  Charlett Blake, MD  Encounter Date: 07/29/2015      OT End of Session - 07/29/15 1647    Visit Number 18   Number of Visits 18   Date for OT Re-Evaluation 08/01/15   Authorization Type Medicare/Medicare A & B   Authorization Time Period Before 20th visit   Authorization - Visit Number 18   Authorization - Number of Visits 20   OT Start Time 1300   OT Stop Time 1345   OT Time Calculation (min) 45 min   Activity Tolerance Patient tolerated treatment well   Behavior During Therapy [facility] for tasks assessed/performed      Past Medical History  Diagnosis Date  . Migraine   . Depression   . Hypertension   . Stroke 2016  . COPD (chronic obstructive pulmonary disease)   . Anxiety     h/o of panic attack  . GERD (gastroesophageal reflux disease)     occas. use of TUMS  . Arthritis     knees    Past Surgical History  Procedure Laterality Date  . Spaulding surgery  2010  . Cholecystectomy      age 58  . Appendectomy      age 24  . Eye surgery  years ago    laser to both eyes for blocked tear ducts   . Brain surgery  1993    aneurysm  . Tubal ligation  years ago  . Hemorroidectomy  years ago  . Radiology with anesthesia N/A 01/20/2015    Procedure: RADIOLOGY WITH ANESTHESIA;  Surgeon: Luanne Bras, MD;  Location: [facility];  Service: Radiology;  Laterality: N/A;    There were no vitals filed for this visit.  Visit Diagnosis:  Left-sided weakness  Lack of coordination due to stroke  Decreased pinch strength  Decreased grip strength of left hand  Hemiparesis, left      Subjective Assessment - 07/29/15 1646    Subjective  S: Are you graduating me today?    Special Tests  FOTO Score: 38/100 (62% impairment)   Currently in Pain? No/denies           [facility] OT Assessment - 07/29/15 1300    Assessment   Diagnosis CVA-left sided weakness   Precautions   Precautions Fall   Coordination   Left 9 Hole Peg Test unable to complete  placed 6 pegs in 7'58"   Box and Blocks left 23 right 43  previous left 17, right 37   Strength   Left Shoulder Flexion 4-/5  same as previous   Left Shoulder ABduction 4/5  previous 4-/5   Left Shoulder Internal Rotation 4-/5  same as previous   Left Shoulder External Rotation 4/5  4-/5 previous   Left Elbow Flexion 4+/5  4/5 previous   Left Elbow Extension 4/5  same as previous   Left Hand Grip (lbs) 20  same as previous   Left Hand Lateral Pinch 7 lbs  6 previous   Left Hand 3 Point Pinch 6 lbs  3 previous                  OT Treatments/Exercises (OP) - 07/29/15 1652    Exercises   Exercises Shoulder;Hand;Wrist   Shoulder Exercises:  ROM/Strengthening   Other ROM/Strengthening Exercises Reviewed and educated pt on shoulder/LUE strengthening HEP to continue at home.    Wrist Exercises   Other wrist exercises Reviewed wrist strengthening HEP to complete on discharge.    Fine Motor Coordination   Other Fine Motor Exercises Reviewed fine motor coordination activities to complete at home including peg board and functional use of left hand and digits                OT Education - 07/29/15 1646    Education provided Yes   Education Details shoulder and wrist strengthening exercises provided; reviewed theraputty and fine motor coordination exercises/activities   Person(s) Educated Patient;Spouse   Methods Explanation;Demonstration;Handout   Comprehension Verbalized understanding;Returned demonstration          OT Short Term Goals - 07/29/15 1648    OT SHORT TERM GOAL #1   Title Pt will be educated on and independent in [place].    Time 4   Period Weeks   Status Achieved   OT SHORT TERM GOAL #2    Title Pt will increase LUE strength to 4/5 to increase ability to assist in donning shirts.    Time 4   Period Weeks   Status Partially Met   OT SHORT TERM GOAL #3   Title Patient will increase grip strength by 5# and pinch strength by 3# to increase ability to hold coffee cup.    Time 4   Period Weeks   Status Achieved   OT SHORT TERM GOAL #4   Title Patient will increase fine motor coordination by completing 9 hole peg test within 2 minutes.    Time 4   Period Weeks   Status Not Met           OT Long Term Goals - 07/29/15 1649    OT LONG TERM GOAL #1   Title Pt will complete all BADL tasks at Mod I level, including dressing, bathing, and meal preparation.    Time 8   Period Weeks   Status Not Met   OT LONG TERM GOAL #2   Title Pt will increase LUE strength to 4+/5 to increase ability to complete daily tasks.    Time 8   Period Weeks   Status Not Met   OT LONG TERM GOAL #3   Title Patient will increase grip strength by 10# and pinch strength by 5# to increase ability to grasp walker or surfaces during transfers and functional mobility tasks.    Time 8   Period Weeks   Status Partially Met   OT LONG TERM GOAL #4   Title Pt will increase fine motor coordination by completing 9 hole peg test in under 1 minute.    Time 8   Period Weeks   Status Not Met               Plan - 07/29/15 1648    Clinical Impression Statement A: Reassessment completed this session. Pt has met 2/4 STGs partially met 1/4 STGs, has met 1/4 LTGs. Pt has made minimal progress towards achieving goals, however does demonstrate improved ability to control LUE movements and is now using vision to compensate for decreased sensation in LUE. Pt is limited in ability to progress further in grip, pinch, and fine motor coordination tasks due to minimal sensation in left hand. Pt has been educated on HEP to continue strengthening LUE and improving coordination. Pt and husband are agreeable to discharge.  Plan P: Discharge pt.           G-Codes - 08/02/2015 1700    Functional Assessment Tool Used FOTO Score: 38/100 (62% impairment)   Functional Limitation Self care   Self Care Goal Status (B5248) At least 20 percent but less than 40 percent impaired, limited or restricted   Self Care Discharge Status [phone]) At least 60 percent but less than 80 percent impaired, limited or restricted      Problem List Patient Active Problem List   Diagnosis Date Noted  . Cerebral infarction due to embolism of left carotid artery 03/04/2015  . Aneurysm, cerebral, nonruptured 03/04/2015  . History of ruptured arterial aneurysm 03/04/2015  . Essential hypertension 03/04/2015  . HLD (hyperlipidemia) 03/04/2015  . Alterations of sensations following CVA (cerebrovascular accident)   . Embolic cerebral infarction 01/22/2015  . Left hemiparesis 01/22/2015  . Stroke   . Cerebral embolism with cerebral infarction 01/21/2015  . Ataxia   . Brain aneurysm 01/20/2015  . TOBACCO ABUSE 01/04/2010  . BRONCHITIS, CHRONIC 01/04/2010  . CERVICAL CANCER 01/03/2010  . HYPERLIPIDEMIA 01/03/2010  . DEPRESSION 01/03/2010  . SUBARACHNOID HEMORRHAGE 01/03/2010  . [name] 01/03/2010    Guadelupe Sabin, OTR/L  [phone]  08-02-2015, 5:01 PM  [place], [place], [address] Phone: [phone]   Fax:  [phone]   OCCUPATIONAL THERAPY DISCHARGE SUMMARY  Visits from Start of Care: 18  Current functional level related to goals / functional outcomes: See above for goals. Pt has made limited progress during occupational therapy treatments. Pt is able to use her LUE in a more controlled manner, however is unable to complete fine motor tasks without max difficulty due to numbness in left hand. Pt is now able to use compensatory strategies including visual cuing to incorporate left hand and digits into functional and daily tasks.    Remaining  deficits: Pt continues to exhibit numbness, decreased grip strength, decreased pinch strength, and decreased fine motor coordination of LUE. Pt requires assistance with ADL and functional tasks as a result of limited sensation and ataxic movement of the left hand.    Education / Equipment: Pt was educated on shoulder and wrist strengthening HEP, as well as reviewed theraputty and fine motor activities to complete at home.   Plan: Patient agrees to discharge.  Patient goals were not met. Patient is being discharged due to lack of progress.  ?????

## 2015-08-03 ENCOUNTER — Encounter (HOSPITAL_COMMUNITY): Payer: Medicare Other | Admitting: Occupational Therapy

## 2015-08-03 ENCOUNTER — Encounter (HOSPITAL_COMMUNITY): Payer: Medicare Other | Admitting: Physical Therapy

## 2015-08-05 ENCOUNTER — Encounter (HOSPITAL_COMMUNITY): Payer: Medicare Other | Admitting: Physical Therapy

## 2015-08-05 ENCOUNTER — Encounter (HOSPITAL_COMMUNITY): Payer: Medicare Other | Admitting: Occupational Therapy

## 2015-08-06 ENCOUNTER — Encounter (HOSPITAL_COMMUNITY): Payer: Self-pay

## 2015-08-06 ENCOUNTER — Encounter (HOSPITAL_COMMUNITY)
Admission: RE | Admit: 2015-08-06 | Discharge: 2015-08-06 | Disposition: A | Discharge status: Home / Self Care | Payer: Medicare Other | Source: Ambulatory Visit | Attending: Ophthalmology | Admitting: Ophthalmology

## 2015-08-06 DIAGNOSIS — H2511 Age-related nuclear cataract, right eye: Secondary | ICD-10-CM | POA: Diagnosis not present

## 2015-08-06 DIAGNOSIS — Z01818 Encounter for other preprocedural examination: Secondary | ICD-10-CM | POA: Insufficient documentation

## 2015-08-06 DIAGNOSIS — Z23 Encounter for immunization: Secondary | ICD-10-CM | POA: Diagnosis not present

## 2015-08-06 LAB — CBC WITH DIFFERENTIAL/PLATELET
Basophils Absolute: 0 10*3/uL (ref 0.0–0.1)
Basophils Relative: 1 %
EOS ABS: 0.5 10*3/uL (ref 0.0–0.7)
EOS PCT: 7 %
HCT: 39.7 % (ref 36.0–46.0)
Hemoglobin: 13.9 g/dL (ref 12.0–15.0)
LYMPHS ABS: 3.1 10*3/uL (ref 0.7–4.0)
Lymphocytes Relative: 46 %
MCH: 33.1 pg (ref 26.0–34.0)
MCHC: 35 g/dL (ref 30.0–36.0)
MCV: 94.5 fL (ref 78.0–100.0)
Monocytes Absolute: 0.6 10*3/uL (ref 0.1–1.0)
Monocytes Relative: 10 %
Neutro Abs: 2.4 10*3/uL (ref 1.7–7.7)
Neutrophils Relative %: 36 %
PLATELETS: 227 10*3/uL (ref 150–400)
RBC: 4.2 MIL/uL (ref 3.87–5.11)
RDW: 13.4 % (ref 11.5–15.5)
WBC: 6.6 10*3/uL (ref 4.0–10.5)

## 2015-08-06 LAB — BASIC METABOLIC PANEL
Anion gap: 7 (ref 5–15)
BUN: 11 mg/dL (ref 6–20)
CALCIUM: 8.7 mg/dL — AB (ref 8.9–10.3)
CO2: 28 mmol/L (ref 22–32)
CREATININE: 1.01 mg/dL — AB (ref 0.44–1.00)
Chloride: 103 mmol/L (ref 101–111)
GFR calc Af Amer: >60 mL/min (ref >60)
GFR, EST NON AFRICAN AMERICAN: 56 mL/min — AB (ref >60)
Glucose, Bld: 112 mg/dL — ABNORMAL HIGH (ref 65–99)
POTASSIUM: 3.7 mmol/L (ref 3.5–5.1)
SODIUM: 138 mmol/L (ref 135–145)

## 2015-08-06 NOTE — Patient Instructions (Signed)
Your procedure is scheduled on: 08/10/2015 Report to [facility] at  800  AM.  Call this number if you have problems the morning of surgery: [phone]   Do not eat food or drink liquids :After Midnight.      Take these medicines the morning of surgery with A SIP OF WATER: xanax, lotensin, neurontin, metoprolol.  Do not wear jewelry, make-up or nail polish.  Do not wear lotions, powders, or perfumes. You may wear deodorant.  Do not shave 48 hours prior to surgery.  Do not bring valuables to the hospital.  Contacts, dentures or bridgework may not be worn into surgery.  Leave suitcase in the car. After surgery it may be brought to your room.  For patients admitted to the hospital, checkout time is 11:00 AM the day of discharge.   Patients discharged the day of surgery will not be allowed to drive home.  :     Please read over the following fact sheets that you were given: Coughing and Deep Breathing, Surgical Site Infection Prevention, Anesthesia Post-op Instructions and Care and Recovery After Surgery    Cataract A cataract is a clouding of the lens of the eye. When a lens becomes cloudy, vision is reduced based on the degree and nature of the clouding. Many cataracts reduce vision to some degree. Some cataracts make people more near-sighted as they develop. Other cataracts increase glare. Cataracts that are ignored and become worse can sometimes look white. The white color can be seen through the pupil. CAUSES   Aging. However, cataracts may occur at any age, even in newborns.   Certain drugs.   Trauma to the eye.   Certain diseases such as diabetes.   Specific eye diseases such as chronic inflammation inside the eye or a sudden attack of a rare form of glaucoma.   Inherited or acquired medical problems.  SYMPTOMS   Gradual, progressive drop in vision in the affected eye.   Severe, rapid visual loss. This most often happens when trauma is the cause.  DIAGNOSIS  To detect a  cataract, an eye doctor examines the lens. Cataracts are best diagnosed with an exam of the eyes with the pupils enlarged (dilated) by drops.  TREATMENT  For an early cataract, vision may improve by using different eyeglasses or stronger lighting. If that does not help your vision, surgery is the only effective treatment. A cataract needs to be surgically removed when vision loss interferes with your everyday activities, such as driving, reading, or watching TV. A cataract may also have to be removed if it prevents examination or treatment of another eye problem. Surgery removes the cloudy lens and usually replaces it with a substitute lens (intraocular lens, IOL).  At a time when both you and your doctor agree, the cataract will be surgically removed. If you have cataracts in both eyes, only one is usually removed at a time. This allows the operated eye to heal and be out of danger from any possible problems after surgery (such as infection or poor wound healing). In rare cases, a cataract may be doing damage to your eye. In these cases, your caregiver may advise surgical removal right away. The vast majority of people who have cataract surgery have better vision afterward. HOME CARE INSTRUCTIONS  If you are not planning surgery, you may be asked to do the following:  Use different eyeglasses.   Use stronger or brighter lighting.   Ask your eye doctor about reducing your medicine dose  or changing medicines if it is thought that a medicine caused your cataract. Changing medicines does not make the cataract go away on its own.   Become familiar with your surroundings. Poor vision can lead to injury. Avoid bumping into things on the affected side. You are at a higher risk for tripping or falling.   Exercise extreme care when driving or operating machinery.   Wear sunglasses if you are sensitive to bright light or experiencing problems with glare.  SEEK IMMEDIATE MEDICAL CARE IF:   You have a  worsening or sudden vision loss.   You notice redness, swelling, or increasing pain in the eye.   You have a fever.  Document Released: 10/30/2005 Document Revised: 10/19/2011 Document Reviewed: 06/23/2011 [facility] Patient Information 2012 [place].PATIENT INSTRUCTIONS POST-ANESTHESIA  IMMEDIATELY FOLLOWING SURGERY:  Do not drive or operate machinery for the first twenty four hours after surgery.  Do not make any important decisions for twenty four hours after surgery or while taking narcotic pain medications or sedatives.  If you develop intractable nausea and vomiting or a severe headache please notify your doctor immediately.  FOLLOW-UP:  Please make an appointment with your surgeon as instructed. You do not need to follow up with anesthesia unless specifically instructed to do so.  WOUND CARE INSTRUCTIONS (if applicable):  Keep a dry clean dressing on the anesthesia/puncture wound site if there is drainage.  Once the wound has quit draining you may leave it open to air.  Generally you should leave the bandage intact for twenty four hours unless there is drainage.  If the epidural site drains for more than 36-48 hours please call the anesthesia department.  QUESTIONS?:  Please feel free to call your physician or the hospital operator if you have any questions, and they will be happy to assist you.

## 2015-08-06 NOTE — Pre-Procedure Instructions (Signed)
Patient given information to sign up for my chart at home. 

## 2015-08-09 MED ORDER — CYCLOPENTOLATE-PHENYLEPHRINE OP SOLN OPTIME - NO CHARGE
OPHTHALMIC | Status: AC
  Filled 2015-08-09: qty 2

## 2015-08-09 MED ORDER — TETRACAINE HCL 0.5 % OP SOLN
OPHTHALMIC | Status: AC
  Filled 2015-08-09: qty 2

## 2015-08-09 MED ORDER — KETOROLAC TROMETHAMINE 0.5 % OP SOLN
OPHTHALMIC | Status: AC
  Filled 2015-08-09: qty 5

## 2015-08-09 MED ORDER — PHENYLEPHRINE HCL 2.5 % OP SOLN
OPHTHALMIC | Status: AC
  Filled 2015-08-09: qty 15

## 2015-08-10 ENCOUNTER — Encounter (HOSPITAL_COMMUNITY): Payer: Self-pay | Admitting: *Deleted

## 2015-08-10 ENCOUNTER — Ambulatory Visit (HOSPITAL_COMMUNITY)
Admission: RE | Admit: 2015-08-10 | Discharge: 2015-08-10 | Disposition: A | Discharge status: Home / Self Care | Payer: Medicare Other | Source: Ambulatory Visit | Attending: Ophthalmology | Admitting: Ophthalmology

## 2015-08-10 ENCOUNTER — Ambulatory Visit (HOSPITAL_COMMUNITY): Payer: Medicare Other | Admitting: Anesthesiology

## 2015-08-10 ENCOUNTER — Encounter (HOSPITAL_COMMUNITY)
Admission: RE | Disposition: A | Discharge status: Home / Self Care | Payer: Self-pay | Source: Ambulatory Visit | Attending: Ophthalmology

## 2015-08-10 DIAGNOSIS — H269 Unspecified cataract: Secondary | ICD-10-CM | POA: Diagnosis not present

## 2015-08-10 DIAGNOSIS — Z7982 Long term (current) use of aspirin: Secondary | ICD-10-CM | POA: Diagnosis not present

## 2015-08-10 DIAGNOSIS — Z7902 Long term (current) use of antithrombotics/antiplatelets: Secondary | ICD-10-CM | POA: Diagnosis not present

## 2015-08-10 DIAGNOSIS — K219 Gastro-esophageal reflux disease without esophagitis: Secondary | ICD-10-CM | POA: Insufficient documentation

## 2015-08-10 DIAGNOSIS — F17200 Nicotine dependence, unspecified, uncomplicated: Secondary | ICD-10-CM | POA: Diagnosis not present

## 2015-08-10 DIAGNOSIS — F418 Other specified anxiety disorders: Secondary | ICD-10-CM | POA: Diagnosis not present

## 2015-08-10 DIAGNOSIS — I10 Essential (primary) hypertension: Secondary | ICD-10-CM | POA: Insufficient documentation

## 2015-08-10 DIAGNOSIS — H2511 Age-related nuclear cataract, right eye: Secondary | ICD-10-CM | POA: Diagnosis not present

## 2015-08-10 DIAGNOSIS — J449 Chronic obstructive pulmonary disease, unspecified: Secondary | ICD-10-CM | POA: Insufficient documentation

## 2015-08-10 DIAGNOSIS — H2512 Age-related nuclear cataract, left eye: Secondary | ICD-10-CM | POA: Diagnosis not present

## 2015-08-10 HISTORY — PX: CATARACT EXTRACTION W/PHACO: SHX586

## 2015-08-10 SURGERY — PHACOEMULSIFICATION, CATARACT, WITH IOL INSERTION
Anesthesia: Monitor Anesthesia Care | Site: Eye | Laterality: Right

## 2015-08-10 MED ORDER — KETOROLAC TROMETHAMINE 0.5 % OP SOLN
1.0000 [drp] | OPHTHALMIC | Status: AC
  Administered 2015-08-10 (×3): 1 [drp] via OPHTHALMIC

## 2015-08-10 MED ORDER — MIDAZOLAM HCL 2 MG/2ML IJ SOLN
INTRAMUSCULAR | Status: AC
  Filled 2015-08-10: qty 2

## 2015-08-10 MED ORDER — CYCLOPENTOLATE-PHENYLEPHRINE 0.2-1 % OP SOLN
1.0000 [drp] | OPHTHALMIC | Status: AC
  Administered 2015-08-10 (×3): 1 [drp] via OPHTHALMIC

## 2015-08-10 MED ORDER — TETRACAINE HCL 0.5 % OP SOLN
1.0000 [drp] | OPHTHALMIC | Status: AC
  Administered 2015-08-10 (×3): 1 [drp] via OPHTHALMIC

## 2015-08-10 MED ORDER — EPINEPHRINE HCL 1 MG/ML IJ SOLN
INTRAMUSCULAR | Status: AC
  Filled 2015-08-10: qty 1

## 2015-08-10 MED ORDER — MIDAZOLAM HCL 2 MG/2ML IJ SOLN
1.0000 mg | INTRAMUSCULAR | Status: DC | PRN
  Administered 2015-08-10: 2 mg via INTRAVENOUS

## 2015-08-10 MED ORDER — PROVISC 10 MG/ML IO SOLN
INTRAOCULAR | Status: DC | PRN
  Administered 2015-08-10: 0.85 mL via INTRAOCULAR

## 2015-08-10 MED ORDER — FENTANYL CITRATE (PF) 100 MCG/2ML IJ SOLN
INTRAMUSCULAR | Status: AC
  Filled 2015-08-10: qty 2

## 2015-08-10 MED ORDER — TETRACAINE 0.5 % OP SOLN OPTIME - NO CHARGE
OPHTHALMIC | Status: DC | PRN
  Administered 2015-08-10: 2 [drp] via OPHTHALMIC

## 2015-08-10 MED ORDER — LACTATED RINGERS IV SOLN
INTRAVENOUS | Status: DC
  Administered 2015-08-10: 09:00:00 via INTRAVENOUS

## 2015-08-10 MED ORDER — EPINEPHRINE HCL 1 MG/ML IJ SOLN
INTRAOCULAR | Status: DC | PRN
  Administered 2015-08-10: 500 mL

## 2015-08-10 MED ORDER — BSS IO SOLN
INTRAOCULAR | Status: DC | PRN
  Administered 2015-08-10: 15 mL

## 2015-08-10 MED ORDER — PHENYLEPHRINE HCL 2.5 % OP SOLN
1.0000 [drp] | OPHTHALMIC | Status: AC
  Administered 2015-08-10 (×3): 1 [drp] via OPHTHALMIC

## 2015-08-10 MED ORDER — FENTANYL CITRATE (PF) 100 MCG/2ML IJ SOLN
25.0000 ug | Freq: Once | INTRAMUSCULAR | Status: AC
  Administered 2015-08-10: 25 ug via INTRAVENOUS

## 2015-08-10 SURGICAL SUPPLY — 10 items

## 2015-08-10 NOTE — Op Note (Signed)
Patient brought to the operating room and prepped and draped in the usual manner.  Lid speculum inserted in right eye.  Stab incision made at the twelve o'clock position.  Provisc instilled in the anterior chamber.   A 2.4 mm. Stab incision was made temporally.  An anterior capsulotomy was done with a bent 25 gauge needle.  The nucleus was hydrodissected.  The Phaco tip was inserted in the anterior chamber and the nucleus was emulsified.  CDE was 8.19.  The cortical material was then removed with the I and A tip.  Posterior capsule was the polished.  The anterior chamber was deepened with Provisc.  A 22.5 Diopter Alcon SN60WF IOL was then inserted in the capsular bag.  Provisc was then removed with the I and A tip.  The wound was then hydrated.  Patient sent to the Recovery Room in good condition with follow up in my office.  Preoperative Diagnosis:  Nuclear Cataract OD Postoperative Diagnosis:  Same Procedure name: Kelman Phacoemulsification OD with IOL

## 2015-08-10 NOTE — Discharge Instructions (Signed)
Diane Hutchinson [name]  08/10/2015           [name] [name] [address]      1. Avoid closing eyes tightly. One often closes the eye tightly when laughing, talking, sneezing, coughing or if they feel irritated. At these times, you should be careful not to close your eyes tightly.  2. Instill eye drops as instructed. To instill drops in your eye, open it, look up and have someone gently pull the lower lid down and instill a couple of drops inside the lower lid.  3. Do not touch upper lid.  4. Take Advil or Tylenol for pain.  5. You may use either eye for near work, such as reading or sewing and you may watch television.  6. You may have your hair done at the beauty parlor at any time.  7. Wear dark glasses with or without your own glasses if you are in bright light.  8. Call our office at [phone] or [phone] if you have sharp pain in your eye or unusual symptoms.  9. Do not be concerned because vision in the operative eye is not good. It will not be good, no matter how successful the operation, until you get a special lens for it. Your old glasses will not be suited to the new eye that was operated on and you will not be ready for a new lens for about a month.  10. Follow up at the [facility] office.    I have received a copy of the above instructions and will follow them.

## 2015-08-10 NOTE — Anesthesia Postprocedure Evaluation (Signed)
  Anesthesia Post-op Note  Patient: Diane Hutchinson  Procedure(s) Performed: Procedure(s) with comments: CATARACT EXTRACTION PHACO AND INTRAOCULAR LENS PLACEMENT (IOC) (Right) - CDE:8.19  Patient Location: Short Stay  Anesthesia Type:MAC  Level of Consciousness: awake  Airway and Oxygen Therapy: Patient Spontanous Breathing  Post-op Pain: none  Post-op Assessment: Post-op Vital signs reviewed, Patient's Cardiovascular Status Stable, Respiratory Function Stable, Patent Airway and No signs of Nausea or vomiting              Post-op Vital Signs: Reviewed and stable  Last Vitals:  Filed Vitals:   08/10/15 0940  BP: 111/47  Pulse:   Temp:   Resp: 26    Complications: No apparent anesthesia complications

## 2015-08-10 NOTE — Anesthesia Preprocedure Evaluation (Signed)
Anesthesia Evaluation  Patient identified by MRN, date of birth, ID band Patient awake    Reviewed: Allergy & Precautions, NPO status , Patient's Chart, lab work & pertinent test results  Airway Mallampati: III  TM Distance: >3 FB     Dental  (+) Partial Lower, Partial Upper   Pulmonary COPD, Current Smoker,           Cardiovascular hypertension, Pt. on medications + Peripheral Vascular Disease   Rhythm:Regular Rate:Normal     Neuro/Psych  Headaches, PSYCHIATRIC DISORDERS Anxiety Depression CVA    GI/Hepatic GERD  Medicated,  Endo/Other    Renal/GU      Musculoskeletal  (+) Arthritis ,   Abdominal   Peds  Hematology   Anesthesia Other Findings   Reproductive/Obstetrics                             Anesthesia Physical Anesthesia Plan  ASA: III  Anesthesia Plan: MAC   Post-op Pain Management:    Induction: Intravenous  Airway Management Planned: Nasal Cannula  Additional Equipment:   Intra-op Plan:   Post-operative Plan:   Informed Consent: I have reviewed the patients History and Physical, chart, labs and discussed the procedure including the risks, benefits and alternatives for the proposed anesthesia with the patient or authorized representative who has indicated his/her understanding and acceptance.     Plan Discussed with:   Anesthesia Plan Comments:         Anesthesia Quick Evaluation

## 2015-08-10 NOTE — H&P (Signed)
The patient was re examined and there is no change in the patients condition since the original H and P. 

## 2015-08-10 NOTE — Transfer of Care (Signed)
Immediate Anesthesia Transfer of Care Note  Patient: Diane Hutchinson  Procedure(s) Performed: Procedure(s) with comments: CATARACT EXTRACTION PHACO AND INTRAOCULAR LENS PLACEMENT (IOC) (Right) - CDE:8.19  Patient Location: Short Stay  Anesthesia Type:MAC  Level of Consciousness: awake  Airway & Oxygen Therapy: Patient Spontanous Breathing  Post-op Assessment: Report given to RN  Post vital signs: Reviewed  Last Vitals:  Filed Vitals:   08/10/15 0940  BP: 111/47  Pulse:   Temp:   Resp: 26    Complications: No apparent anesthesia complications

## 2015-08-10 NOTE — OR Nursing (Signed)
Husband   Has  glasses

## 2015-08-11 ENCOUNTER — Encounter (HOSPITAL_COMMUNITY): Payer: Self-pay | Admitting: Ophthalmology

## 2015-08-16 DIAGNOSIS — I639 Cerebral infarction, unspecified: Secondary | ICD-10-CM | POA: Diagnosis not present

## 2015-08-16 DIAGNOSIS — D241 Benign neoplasm of right breast: Secondary | ICD-10-CM | POA: Diagnosis not present

## 2015-08-19 DIAGNOSIS — I10 Essential (primary) hypertension: Secondary | ICD-10-CM | POA: Diagnosis not present

## 2015-08-19 DIAGNOSIS — E784 Other hyperlipidemia: Secondary | ICD-10-CM | POA: Diagnosis not present

## 2015-08-19 DIAGNOSIS — G8114 Spastic hemiplegia affecting left nondominant side: Secondary | ICD-10-CM | POA: Diagnosis not present

## 2015-08-19 DIAGNOSIS — Z1389 Encounter for screening for other disorder: Secondary | ICD-10-CM | POA: Diagnosis not present

## 2015-08-19 DIAGNOSIS — Z0000 Encounter for general adult medical examination without abnormal findings: Secondary | ICD-10-CM | POA: Diagnosis not present

## 2015-08-20 ENCOUNTER — Encounter (HOSPITAL_COMMUNITY)
Admission: RE | Admit: 2015-08-20 | Discharge: 2015-08-20 | Disposition: A | Discharge status: Home / Self Care | Payer: Medicare Other | Source: Ambulatory Visit | Attending: Ophthalmology | Admitting: Ophthalmology

## 2015-08-20 ENCOUNTER — Encounter (HOSPITAL_COMMUNITY): Payer: Self-pay

## 2015-08-23 MED ORDER — TETRACAINE HCL 0.5 % OP SOLN
OPHTHALMIC | Status: AC
  Filled 2015-08-23: qty 2

## 2015-08-23 MED ORDER — PHENYLEPHRINE HCL 2.5 % OP SOLN
OPHTHALMIC | Status: AC
  Filled 2015-08-23: qty 15

## 2015-08-23 MED ORDER — CYCLOPENTOLATE-PHENYLEPHRINE OP SOLN OPTIME - NO CHARGE
OPHTHALMIC | Status: AC
  Filled 2015-08-23: qty 2

## 2015-08-23 MED ORDER — KETOROLAC TROMETHAMINE 0.5 % OP SOLN
OPHTHALMIC | Status: AC
  Filled 2015-08-23: qty 5

## 2015-08-24 ENCOUNTER — Ambulatory Visit (HOSPITAL_COMMUNITY)
Admission: RE | Admit: 2015-08-24 | Discharge: 2015-08-24 | Disposition: A | Discharge status: Home / Self Care | Payer: Medicare Other | Source: Ambulatory Visit | Attending: Ophthalmology | Admitting: Ophthalmology

## 2015-08-24 ENCOUNTER — Encounter (HOSPITAL_COMMUNITY)
Admission: RE | Disposition: A | Discharge status: Home / Self Care | Payer: Self-pay | Source: Ambulatory Visit | Attending: Ophthalmology

## 2015-08-24 ENCOUNTER — Ambulatory Visit (HOSPITAL_COMMUNITY): Payer: Medicare Other | Admitting: Anesthesiology

## 2015-08-24 ENCOUNTER — Encounter (HOSPITAL_COMMUNITY): Payer: Self-pay | Admitting: *Deleted

## 2015-08-24 DIAGNOSIS — Z7982 Long term (current) use of aspirin: Secondary | ICD-10-CM | POA: Insufficient documentation

## 2015-08-24 DIAGNOSIS — H269 Unspecified cataract: Secondary | ICD-10-CM | POA: Diagnosis not present

## 2015-08-24 DIAGNOSIS — M1990 Unspecified osteoarthritis, unspecified site: Secondary | ICD-10-CM | POA: Insufficient documentation

## 2015-08-24 DIAGNOSIS — I10 Essential (primary) hypertension: Secondary | ICD-10-CM | POA: Diagnosis not present

## 2015-08-24 DIAGNOSIS — Z7902 Long term (current) use of antithrombotics/antiplatelets: Secondary | ICD-10-CM | POA: Insufficient documentation

## 2015-08-24 DIAGNOSIS — H2512 Age-related nuclear cataract, left eye: Secondary | ICD-10-CM | POA: Insufficient documentation

## 2015-08-24 DIAGNOSIS — F419 Anxiety disorder, unspecified: Secondary | ICD-10-CM | POA: Insufficient documentation

## 2015-08-24 DIAGNOSIS — K219 Gastro-esophageal reflux disease without esophagitis: Secondary | ICD-10-CM | POA: Diagnosis not present

## 2015-08-24 DIAGNOSIS — J449 Chronic obstructive pulmonary disease, unspecified: Secondary | ICD-10-CM | POA: Insufficient documentation

## 2015-08-24 DIAGNOSIS — F17200 Nicotine dependence, unspecified, uncomplicated: Secondary | ICD-10-CM | POA: Insufficient documentation

## 2015-08-24 DIAGNOSIS — Z79899 Other long term (current) drug therapy: Secondary | ICD-10-CM | POA: Diagnosis not present

## 2015-08-24 HISTORY — PX: CATARACT EXTRACTION W/PHACO: SHX586

## 2015-08-24 SURGERY — PHACOEMULSIFICATION, CATARACT, WITH IOL INSERTION
Anesthesia: Monitor Anesthesia Care | Site: Eye | Laterality: Left

## 2015-08-24 MED ORDER — FENTANYL CITRATE (PF) 100 MCG/2ML IJ SOLN
INTRAMUSCULAR | Status: AC
  Filled 2015-08-24: qty 2

## 2015-08-24 MED ORDER — FENTANYL CITRATE (PF) 100 MCG/2ML IJ SOLN
25.0000 ug | INTRAMUSCULAR | Status: AC
  Administered 2015-08-24 (×2): 25 ug via INTRAVENOUS

## 2015-08-24 MED ORDER — MIDAZOLAM HCL 2 MG/2ML IJ SOLN
1.0000 mg | INTRAMUSCULAR | Status: DC | PRN
  Administered 2015-08-24: 2 mg via INTRAVENOUS

## 2015-08-24 MED ORDER — MIDAZOLAM HCL 2 MG/2ML IJ SOLN
INTRAMUSCULAR | Status: AC
  Filled 2015-08-24: qty 2

## 2015-08-24 MED ORDER — TETRACAINE HCL 0.5 % OP SOLN
1.0000 [drp] | OPHTHALMIC | Status: AC
  Administered 2015-08-24 (×3): 1 [drp] via OPHTHALMIC

## 2015-08-24 MED ORDER — EPINEPHRINE HCL 1 MG/ML IJ SOLN
INTRAOCULAR | Status: DC | PRN
  Administered 2015-08-24: 500 mL

## 2015-08-24 MED ORDER — EPINEPHRINE HCL 1 MG/ML IJ SOLN
INTRAMUSCULAR | Status: AC
  Filled 2015-08-24: qty 1

## 2015-08-24 MED ORDER — KETOROLAC TROMETHAMINE 0.5 % OP SOLN
1.0000 [drp] | OPHTHALMIC | Status: AC
  Administered 2015-08-24 (×3): 1 [drp] via OPHTHALMIC

## 2015-08-24 MED ORDER — TETRACAINE 0.5 % OP SOLN OPTIME - NO CHARGE
OPHTHALMIC | Status: DC | PRN
  Administered 2015-08-24: 2 [drp] via OPHTHALMIC

## 2015-08-24 MED ORDER — PHENYLEPHRINE HCL 2.5 % OP SOLN
1.0000 [drp] | OPHTHALMIC | Status: AC
  Administered 2015-08-24 (×3): 1 [drp] via OPHTHALMIC

## 2015-08-24 MED ORDER — BSS IO SOLN
INTRAOCULAR | Status: DC | PRN
  Administered 2015-08-24: 15 mL

## 2015-08-24 MED ORDER — LACTATED RINGERS IV SOLN
INTRAVENOUS | Status: DC
  Administered 2015-08-24: 08:00:00 via INTRAVENOUS

## 2015-08-24 MED ORDER — PROVISC 10 MG/ML IO SOLN
INTRAOCULAR | Status: DC | PRN
  Administered 2015-08-24: 0.85 mL via INTRAOCULAR

## 2015-08-24 MED ORDER — CYCLOPENTOLATE-PHENYLEPHRINE 0.2-1 % OP SOLN
1.0000 [drp] | OPHTHALMIC | Status: AC
  Administered 2015-08-24 (×3): 1 [drp] via OPHTHALMIC

## 2015-08-24 SURGICAL SUPPLY — 9 items
CLOTH BEACON ORANGE TIMEOUT ST (SAFETY) ×2 IMPLANT
EYE SHIELD UNIVERSAL CLEAR (GAUZE/BANDAGES/DRESSINGS) ×2 IMPLANT
GLOVE BIOGEL M 6.5 STRL (GLOVE) ×2 IMPLANT
GLOVE EXAM NITRILE MD LF STRL (GLOVE) ×2 IMPLANT
LENS ALC ACRYL/TECN (Ophthalmic Related) ×3 IMPLANT
PAD ARMBOARD 7.5X6 YLW CONV (MISCELLANEOUS) ×2 IMPLANT
TAPE SURG TRANSPORE 1 IN (GAUZE/BANDAGES/DRESSINGS) IMPLANT
TAPE SURGICAL TRANSPORE 1 IN (GAUZE/BANDAGES/DRESSINGS) ×2
WATER STERILE IRR 250ML POUR (IV SOLUTION) ×2 IMPLANT

## 2015-08-24 NOTE — Anesthesia Postprocedure Evaluation (Signed)
  Anesthesia Post-op Note  Patient: Diane Hutchinson  Procedure(s) Performed: Procedure(s) with comments: CATARACT EXTRACTION PHACO AND INTRAOCULAR LENS PLACEMENT (IOC) (Left) - CDE:6.31  Patient Location: Short Stay  Anesthesia Type:MAC  Level of Consciousness: awake, alert  and oriented  Airway and Oxygen Therapy: Patient Spontanous Breathing  Post-op Pain: none  Post-op Assessment: Post-op Vital signs reviewed, Patient's Cardiovascular Status Stable, Respiratory Function Stable, Patent Airway and No signs of Nausea or vomiting              Post-op Vital Signs: Reviewed and stable  Last Vitals:  Filed Vitals:   08/24/15 0838  BP: 107/53  Pulse:   Temp:   Resp: 13    Complications: No apparent anesthesia complications

## 2015-08-24 NOTE — Transfer of Care (Signed)
Immediate Anesthesia Transfer of Care Note  Patient: Diane Hutchinson  Procedure(s) Performed: Procedure(s) with comments: CATARACT EXTRACTION PHACO AND INTRAOCULAR LENS PLACEMENT (IOC) (Left) - CDE:6.31  Patient Location: Short Stay  Anesthesia Type:MAC  Level of Consciousness: awake  Airway & Oxygen Therapy: Patient Spontanous Breathing  Post-op Assessment: Report given to RN  Post vital signs: Reviewed  Last Vitals:  Filed Vitals:   08/24/15 0838  BP: 107/53  Pulse:   Temp:   Resp: 13    Complications: No apparent anesthesia complications

## 2015-08-24 NOTE — H&P (Signed)
The patient was re examined and there is no change in the patients condition since the original H and P. 

## 2015-08-24 NOTE — Discharge Instructions (Signed)
Diane Hutchinson [name]  08/24/2015           [place] Instructions [place] [address]      1. Avoid closing eyes tightly. One often closes the eye tightly when laughing, talking, sneezing, coughing or if they feel irritated. At these times, you should be careful not to close your eyes tightly.  2. Instill eye drops as instructed. To instill drops in your eye, open it, look up and have someone gently pull the lower lid down and instill a couple of drops inside the lower lid.  3. Do not touch upper lid.  4. Take Advil or Tylenol for pain.  5. You may use either eye for near work, such as reading or sewing and you may watch television.  6. You may have your hair done at the beauty parlor at any time.  7. Wear dark glasses with or without your own glasses if you are in bright light.  8. Call our office at [phone] or [phone] if you have sharp pain in your eye or unusual symptoms.  9. Do not be concerned because vision in the operative eye is not good. It will not be good, no matter how successful the operation, until you get a special lens for it. Your old glasses will not be suited to the new eye that was operated on and you will not be ready for a new lens for about a month.  10. Follow up at the [facility] office.    I have received a copy of the above instructions and will follow them.      FOLLOW UP WITH DR. SHAPIRO TODAY BETWEEN 2-3 PM TODAY

## 2015-08-24 NOTE — Op Note (Signed)
Patient brought to the operating room and prepped and draped in the usual manner.  Lid speculum inserted in left eye.  Stab incision made at the twelve o'clock position.  Provisc instilled in the anterior chamber.   A 2.4 mm. Stab incision was made temporally.  An anterior capsulotomy was done with a bent 25 gauge needle.  The nucleus was hydrodissected.  The Phaco tip was inserted in the anterior chamber and the nucleus was emulsified.  CDE was 6.31.  The cortical material was then removed with the I and A tip.  Posterior capsule was the polished.  The anterior chamber was deepened with Provisc.  A 23.0 Diopter Alcon SN60WF IOL was then inserted in the capsular bag.  Provisc was then removed with the I and A tip.  The wound was then hydrated.  Patient sent to the Recovery Room in good condition with follow up in my office.  Preoperative Diagnosis:  Nuclear Cataract OS Postoperative Diagnosis:  Same Procedure name: Kelman Phacoemulsification OS with IOL

## 2015-08-24 NOTE — Anesthesia Preprocedure Evaluation (Signed)
Anesthesia Evaluation  Patient identified by MRN, date of birth, ID band Patient awake    Reviewed: Allergy & Precautions, NPO status , Patient's Chart, lab work & pertinent test results  Airway Mallampati: III  TM Distance: >3 FB     Dental  (+) Partial Lower, Partial Upper   Pulmonary COPD, Current Smoker,           Cardiovascular hypertension, Pt. on medications + Peripheral Vascular Disease   Rhythm:Regular Rate:Normal     Neuro/Psych  Headaches, PSYCHIATRIC DISORDERS Anxiety Depression CVA    GI/Hepatic GERD  Medicated,  Endo/Other    Renal/GU      Musculoskeletal  (+) Arthritis ,   Abdominal   Peds  Hematology   Anesthesia Other Findings   Reproductive/Obstetrics                             Anesthesia Physical Anesthesia Plan  ASA: III  Anesthesia Plan: MAC   Post-op Pain Management:    Induction: Intravenous  Airway Management Planned: Nasal Cannula  Additional Equipment:   Intra-op Plan:   Post-operative Plan:   Informed Consent: I have reviewed the patients History and Physical, chart, labs and discussed the procedure including the risks, benefits and alternatives for the proposed anesthesia with the patient or authorized representative who has indicated his/her understanding and acceptance.     Plan Discussed with:   Anesthesia Plan Comments:         Anesthesia Quick Evaluation

## 2015-08-26 ENCOUNTER — Encounter (HOSPITAL_COMMUNITY): Payer: Self-pay | Admitting: Ophthalmology

## 2015-08-27 ENCOUNTER — Other Ambulatory Visit (HOSPITAL_COMMUNITY): Payer: Self-pay | Admitting: Interventional Radiology

## 2015-08-27 ENCOUNTER — Telehealth (HOSPITAL_COMMUNITY): Payer: Self-pay | Admitting: Interventional Radiology

## 2015-08-27 DIAGNOSIS — I729 Aneurysm of unspecified site: Secondary | ICD-10-CM

## 2015-08-27 NOTE — Telephone Encounter (Signed)
Called and spoke to pt's husband concerning her next f/u due date. He stated that Dr. Estanislado Pandy called himself and spoke to the pt yesterday as well. We will see the pt back in the office for discussion of the next phase of her aneurysm treatment after she sees her neurologist, Dr. Rigoberto Noel. Appointment was made with the pt's husband for 12/10/15. JM

## 2015-11-19 DIAGNOSIS — G8114 Spastic hemiplegia affecting left nondominant side: Secondary | ICD-10-CM | POA: Diagnosis not present

## 2015-11-19 DIAGNOSIS — I10 Essential (primary) hypertension: Secondary | ICD-10-CM | POA: Diagnosis not present

## 2015-11-19 DIAGNOSIS — E784 Other hyperlipidemia: Secondary | ICD-10-CM | POA: Diagnosis not present

## 2015-11-29 ENCOUNTER — Telehealth: Payer: Self-pay

## 2015-11-29 NOTE — Telephone Encounter (Signed)
Unable to leave vm on patients home phone. Rn need to change appt time to 1230 for patient.

## 2015-11-29 NOTE — Telephone Encounter (Signed)
Rn talk to patients daughter Diane Hutchinson about needing to change appt time for her mom. Rn explain the home phone and her fathers cell number, does not have VM set up. Rn stated that the appt time will be at 1230pm. Pt needs to arrive at 1200pm for check in. Diane Hutchinson will advise her mom of the change.

## 2015-11-29 NOTE — Telephone Encounter (Signed)
Unable to leave vm on patients husband cell phone number.

## 2015-12-01 DIAGNOSIS — F332 Major depressive disorder, recurrent severe without psychotic features: Secondary | ICD-10-CM | POA: Diagnosis not present

## 2015-12-03 DIAGNOSIS — Z7982 Long term (current) use of aspirin: Secondary | ICD-10-CM | POA: Diagnosis not present

## 2015-12-03 DIAGNOSIS — I69354 Hemiplegia and hemiparesis following cerebral infarction affecting left non-dominant side: Secondary | ICD-10-CM | POA: Diagnosis not present

## 2015-12-03 DIAGNOSIS — F17200 Nicotine dependence, unspecified, uncomplicated: Secondary | ICD-10-CM | POA: Diagnosis not present

## 2015-12-03 DIAGNOSIS — J449 Chronic obstructive pulmonary disease, unspecified: Secondary | ICD-10-CM | POA: Diagnosis not present

## 2015-12-03 DIAGNOSIS — S299XXA Unspecified injury of thorax, initial encounter: Secondary | ICD-10-CM | POA: Diagnosis not present

## 2015-12-03 DIAGNOSIS — S3991XA Unspecified injury of abdomen, initial encounter: Secondary | ICD-10-CM | POA: Diagnosis not present

## 2015-12-03 DIAGNOSIS — F319 Bipolar disorder, unspecified: Secondary | ICD-10-CM | POA: Diagnosis not present

## 2015-12-03 DIAGNOSIS — N390 Urinary tract infection, site not specified: Secondary | ICD-10-CM | POA: Diagnosis not present

## 2015-12-03 DIAGNOSIS — W19XXXA Unspecified fall, initial encounter: Secondary | ICD-10-CM | POA: Diagnosis not present

## 2015-12-03 DIAGNOSIS — Z79899 Other long term (current) drug therapy: Secondary | ICD-10-CM | POA: Diagnosis not present

## 2015-12-03 DIAGNOSIS — M545 Low back pain: Secondary | ICD-10-CM | POA: Diagnosis not present

## 2015-12-03 DIAGNOSIS — R10814 Left lower quadrant abdominal tenderness: Secondary | ICD-10-CM | POA: Diagnosis not present

## 2015-12-03 DIAGNOSIS — F419 Anxiety disorder, unspecified: Secondary | ICD-10-CM | POA: Diagnosis not present

## 2015-12-03 DIAGNOSIS — R1032 Left lower quadrant pain: Secondary | ICD-10-CM | POA: Diagnosis not present

## 2015-12-03 DIAGNOSIS — S3993XA Unspecified injury of pelvis, initial encounter: Secondary | ICD-10-CM | POA: Diagnosis not present

## 2015-12-03 DIAGNOSIS — S0990XA Unspecified injury of head, initial encounter: Secondary | ICD-10-CM | POA: Diagnosis not present

## 2015-12-03 DIAGNOSIS — E7800 Pure hypercholesterolemia, unspecified: Secondary | ICD-10-CM | POA: Diagnosis not present

## 2015-12-06 NOTE — Telephone Encounter (Signed)
Rn call patient about appt on 12/09/2015 at 1230. Rn told patient to check in at 1200pm for appt. Patient will have her husband drive her there. Rn gave patient [facility] address.

## 2015-12-09 ENCOUNTER — Ambulatory Visit: Payer: Medicare Other | Admitting: Neurology

## 2015-12-09 ENCOUNTER — Encounter: Payer: Self-pay | Admitting: Neurology

## 2015-12-09 ENCOUNTER — Ambulatory Visit (INDEPENDENT_AMBULATORY_CARE_PROVIDER_SITE_OTHER): Payer: Medicare Other | Admitting: Neurology

## 2015-12-09 VITALS — BP 111/57 | HR 54 | Ht 67.0 in | Wt 156.8 lb

## 2015-12-09 DIAGNOSIS — E785 Hyperlipidemia, unspecified: Secondary | ICD-10-CM

## 2015-12-09 DIAGNOSIS — I63132 Cerebral infarction due to embolism of left carotid artery: Secondary | ICD-10-CM

## 2015-12-09 DIAGNOSIS — I10 Essential (primary) hypertension: Secondary | ICD-10-CM

## 2015-12-09 DIAGNOSIS — F411 Generalized anxiety disorder: Secondary | ICD-10-CM | POA: Diagnosis not present

## 2015-12-09 DIAGNOSIS — I671 Cerebral aneurysm, nonruptured: Secondary | ICD-10-CM

## 2015-12-09 DIAGNOSIS — Z8679 Personal history of other diseases of the circulatory system: Secondary | ICD-10-CM | POA: Diagnosis not present

## 2015-12-09 MED ORDER — CLONAZEPAM 0.5 MG PO TABS: 0.5000 mg | ORAL_TABLET | Freq: Two times a day (BID) | ORAL | Status: DC | PRN

## 2015-12-09 NOTE — Progress Notes (Signed)
STROKE NEUROLOGY FOLLOW UP NOTE  NAME: Diane Hutchinson DOB: 07/22/1946  REASON FOR VISIT: stroke follow up HISTORY FROM: husband and chart  Today we had the pleasure of seeing BRAD FALARDEAU in follow-up at our Neurology Clinic. Pt was accompanied by husband.   History Summary Diane Hutchinson is a 70 y.o. 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 Jaynee Eagles on 11/24/14 for clearance for right breast elective surgery. As per chart, she was admitted to [facility] on 11/08/2009 with severe headache, nausea and vomiting and CT scan showed SAH. Cerebral angiogram revealed a right PICA aneurysm, left MCA aneurysm and right MCA aneurysm s/p clipping in 1993. The right PICA aneurysm was coiled by Dr. Patrecia Pour. However, the left MCA aneurysm still left without treated. Due to higher risk of rupture during general anesthesia, Dr. Jaynee Eagles performed CTA head and referred pt to Dr. Estanislado Pandy for management of left MCA aneurysm.  On 01/22/15, pt had first step of stent assisted left widenecked MCA aneurysm procedure. After the procedure, pt developed left UE and LE numbness, tingling, weakness and ataxia. Pt not a tPA candidate secondary to being on IV heparin drip and elevated APTT. She can not have MRI. Repeat CT confirmed right thalamus/posterior IC, considering related to procedure. She was continued on ASA 325 and plavix 75 as well as lipitor 40. She was also recommended to quit smoking. She was sent to CIR after discharge.  She has hx of HLD and on lipitor and lopid since 1993. Hx of HTN and taking metoprolol.  Follow up 03/04/15 - the patient has been doing relatively stable. She finished CIR and discharged home with home therapy 3 times a week. She has quit smoking since March admission. She still has left sided weakness but  slow improving. She will have the 2nd stage of left MCA aneurysm treatment in about 2 months with Dr. Estanislado Pandy. After that, she will decide on the elective right breast procedure (not cancer as per pt). Her BP 149/71 but she said at home the therapist checked on her, it was always at 120s. Her platelet function assay was quite low on last check, therefore her plavix was decreased to half tablets.  Follow up 06/08/15 - the patient has been doing well. Left-sided weakness much more improved than last visit. She came in with wheelchair, however at home she was able to work with therapist to walk with walker. She has been following up with Dr. Letta Pate for rehabilitation. Blood pressure much improved, today in clinic 113/61. She still on aspirin 325 mg and half tablet of Plavix.  Interval History During the interval time, pt initially quit smoking but now resumed smoking. Husband stated that she could be one day very good, walking well, but then the other day, she has left side flaccid, not able to walk and can drop to the ground. She sometimes complain of left knee pain. She does have anxiety and depression, following with psychiatrist and on Xanax, Risperdal, Zoloft and trazodone. She has appointment with Dr. Estanislado Pandy tomorrow. BP at home stable, 120-130. Today in clinic 111/57.  REVIEW OF SYSTEMS: Full 14 system review of systems performed and notable only for those listed below and in HPI above, all others are negative:  Constitutional:   Cardiovascular:  Ear/Nose/Throat:  Skin:  Eyes:   Respiratory:  Gastroitestinal:   Genitourinary:  Hematology/Lymphatic:   Endocrine:  Musculoskeletal:   Allergy/Immunology:   Neurological:   Psychiatric:  Sleep:   The following represents the patient's updated allergies and side effects list: Allergies  Allergen Reactions  . Codeine Other (See Comments)    dellusions  . Meperidine Hcl Other (See Comments)    Hurts stomach  . Morphine Other (See  Comments)    dellusions  . Sulfonamide Derivatives Other (See Comments)    Drives her nuts    The neurologically relevant items on the patient's problem list were reviewed on today's visit.  Neurologic Examination  A problem focused neurological exam (12 or more points of the single system neurologic examination, vital signs counts as 1 point, cranial nerves count for 8 points) was performed.  Blood pressure 111/57, pulse 54, height 5\' 7"  (1.702 m), weight 156 lb 12.8 oz (71.124 kg).  General - Well nourished, well developed, in no apparent distress.  Ophthalmologic - fundi not visualized due to eye movement.  Cardiovascular - Regular rate and rhythm with no murmur.  Mental Status -  Level of arousal and orientation to time, place, and person were intact. Language including expression, naming, repetition, comprehension was assessed and found intact. Fund of Knowledge was assessed and was intact.  Cranial Nerves II - XII - II - Visual field intact OU. III, IV, VI - Extraocular movements intact. V - Facial sensation symmetrical bilaterally VII - Facial movement intact bilaterally. VIII - Hearing & vestibular intact bilaterally. X - Palate elevates symmetrically. XI - Chin turning & shoulder shrug intact bilaterally. XII - Tongue protrusion intact.  Motor Strength - The patient's strength was 4+/5 LUE proximal and distal with left hand drift, 5-/5 LLE proximal and distal, but largely effort related.  Bulk was normal and fasciculations were absent.   Motor Tone - Muscle tone was assessed at the neck and appendages and was normal.  Reflexes - The patient's reflexes were normal in all extremities and she had no pathological reflexes.  Sensory - Light touch, temperature/pinprick were assessed and were subjectively decreased on the left UE and LE.    Coordination - The patient had mild ataxia on the left hand, no ataxia at left LE.  Tremor was absent.  Gait and Station - in  wheelchair, not able to test due to safety concerns.  Data reviewed: I personally reviewed the images and agree with the radiology interpretations.  CTA head 12/03/14 - 1. Prior right MCA bifurcation aneurysm clipping and prior right PICA aneurysm coiling without evidence of residual/recurrent aneurysm. 2. Unchanged 4.5 mm left MCA bifurcation aneurysm. 3. No acute intracranial abnormality.  CTA head and neck 01/20/15 -  Pipeline stent placed in the left MCA spanning a left MCA aneurysm. No complications seen relative to that. No missing vessels. No hemorrhage. One could question mild spasm of the MCA branch just beyond the end of the stent, but the vessel is widely patent beyond that and appears as it did before. Previously clipped right MCA region aneurysm. No missing vessels demonstrated on the right compared to the study of 12/03/2014. 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  CT head 01/22/15 -  Changes consistent with prior aneurysm coiling, clipping and stenting as described. New rounded area of decreased attenuation in the right thalamus laterally consistent with an evolving area of ischemia.  2D echo - Normal LV size and systolic function, EF [id number]. Normal RV size and systolic function. No significant valvular abnormalities.  Mild LAE.  Component     Latest Ref Rng 01/21/2015 01/22/2015  Cholesterol     0 - 200 mg/dL  116  Triglycerides     <150 mg/dL  146  HDL Cholesterol     >39 mg/dL  34 (L)  Total CHOL/HDL Ratio       3.4  VLDL     0 - 40 mg/dL  29  LDL (calc)     0 - 99 mg/dL  53  Hemoglobin A1C     4.8 - 5.6 % 6.0 (H)   Mean Plasma Glucose      126     Assessment: As you may recall, she is a 70 y.o. Caucasian female with PMH of HTN, HLD, right MCA aneurysm s/p clippering in 1993, [place] with right PICA ruptured aneurysm s/p coiling on 11/08/2009 was admitted for left MCA aneurysm management with pipe-line stent on  01/22/15, found to have right thalamic/posterior IC stroke post procedure. Pt stroke risk factor including smoker, hx of HTN and HLD, but LDL controlled well on meds. Her stroke still most likely due to procedure, especially posterior IC stroke may due to embolic source from AchA coming out directly from ICA. She was continued on ASA 325mg  and with half tab plavix (due to low platelet functional assay) as well as lipitor. Follow up with Dr. Estanislado Pandy for management of left MCA aneurysm. Over time, her left-sided weakness and numbness much improved. She initially quit smoking but now resumed. Recently, as per husband, she also has some dramatic fluctuation of left sided weakness, largely effort related, and related to her anxiety and depression. Will switch Xanax to clonazepam, recommend PT/OT.  Plan:   - continue ASA 325 mg and plavix 37.5mg  and lipitor for stroke prevention and for stent management - follow up with Dr. Estanislado Pandy tomorrow. - check BP at home - Follow up with your primary care physician for stroke risk factor modification. Recommend maintain blood pressure goal <130/80, diabetes with hemoglobin A1c goal below 6.5% and lipids with LDL cholesterol goal below 70 mg/dL.  - quit smoking - will refer to outp PT/OT - discontinue Xanax and start clonazepam 0.5mg  bid for anxiety. - continue zoloft, trazodone, and risperdol, and follow up with psychiatrist regularly. - follow up in 3 months.  I spent more than 25 minutes of face to face time with the patient. Greater than 50% of time was spent in counseling and coordination of care. We have discussed about anxiety management, switch medications, and outpt PT/OT.    Orders Placed This Encounter  Procedures  . Ambulatory referral to Physical Therapy    Referral Priority:  Routine    Referral Type:  Physical Medicine    Referral Reason:  Specialty Services Required    Requested Specialty:  Physical Therapy    Number of Visits Requested:  1   . Ambulatory referral to Occupational Therapy    Referral Priority:  Routine    Referral Type:  Occupational Therapy    Referral Reason:  Specialty Services Required    Requested Specialty:  Occupational Therapy    Number of Visits Requested:  1    Meds ordered this encounter  Medications  . clonazePAM (KLONOPIN) 0.5 MG tablet    Sig: Take 1 tablet (0.5 mg total) by mouth 2 (two) times daily as needed for anxiety.    Dispense:  60 tablet    Refill:  2    Patient Instructions  - continue ASA 325 mg and plavix  1/2 tab (37.5mg ) daily for stroke prevention and for stent management - continue to follow up with Dr. Estanislado Pandy for the 2 stage of left MCA aneurysm - continue lipitor for stroke prevention - check BP at home - Follow up with your primary care physician for stroke risk factor modification. Recommend maintain blood pressure goal <130/80, diabetes with hemoglobin A1c goal below 6.5% and lipids with LDL cholesterol goal below 70 mg/dL.  - continue to abstain from smoking - aggressive with PT/OT and do your own exercise at home - follow up in 3 months.  Rosalin Hawking, MD PhD [facility] [address] [place], [place] [address] [phone]

## 2015-12-09 NOTE — Patient Instructions (Signed)
-   continue ASA 325 mg and plavix 37.5mg  and lipitor for stroke prevention and for stent management - need to follow up with Dr. Estanislado Pandy tomorrow. - check BP at home - Follow up with your primary care physician for stroke risk factor modification. Recommend maintain blood pressure goal <130/80, diabetes with hemoglobin A1c goal below 6.5% and lipids with LDL cholesterol goal below 70 mg/dL.  - quit smoking - will refer to outp PT/OT - discontinue Xanax and start clonazepam 0.5mg  bid. - continue zoloft, trazodone, and risperdol, but follow up with psychiatrist regularly. - follow up in 3 months.

## 2015-12-10 ENCOUNTER — Ambulatory Visit (HOSPITAL_COMMUNITY)
Admission: RE | Admit: 2015-12-10 | Discharge: 2015-12-10 | Disposition: A | Discharge status: Home / Self Care | Payer: Medicare Other | Source: Ambulatory Visit | Attending: Interventional Radiology | Admitting: Interventional Radiology

## 2015-12-10 DIAGNOSIS — I729 Aneurysm of unspecified site: Secondary | ICD-10-CM

## 2015-12-13 ENCOUNTER — Telehealth: Payer: Self-pay | Admitting: Neurology

## 2015-12-13 NOTE — Telephone Encounter (Signed)
Maureen/[facility], [place] [phone], clonazePAM Bobbye [place]) 0.5 MG tablet wrote for 60 quantity with 3 refills, on January 18th she got Xanax 1mg , 1 1/2 tabs qid from Dr. Loistine Simas, please call to advise if they are ok to proceed with filling this prescription.

## 2015-12-13 NOTE — Telephone Encounter (Signed)
Rn talk to Diane Hutchinson at [facility] about klonopin being prescribed by Dr.Xu and Xanax being prescribed by Dr.Reddy on 12/01/2015. Rn explain it did not show on the Mar at the last visit. Gwenette Greet stated she will put the klonopin on hold until Dr. Erlinda Hong decides if he wants to discontinue order or continue it.

## 2015-12-13 NOTE — Telephone Encounter (Signed)
Hi, Diane Hutchinson:  I have written clearly on my last clinic note " - discontinue Xanax and start clonazepam 0.5mg  bid for anxiety." she should take clonazepam instead of Xanax for anxiety, and she should not refill the Xanax. If pt does not want to go on with clonazepma, she should let us know. Thanks.  Rosalin Hawking, MD PhD Stroke Neurology 12/13/2015 10:50 PM

## 2015-12-14 NOTE — Telephone Encounter (Signed)
Rn talk to patient about if she was still taking Xanax. Pt stated she went to get the clonazepam but the pharmacy would not give it to her. Rn explain her clonazepam is on hold due to her being prescribed Xanax. Pt stated she did receive Xanax from Dr.Reddy but does not know how many pills she has, but is not taking it. Rn stress to patient that per Dr.Xu last office visit from last week he wants her to only take clonazepam 0.5 bid only. Pt verbalized understanding. Rn stated a call will be made to [facility] to release the clonazepam prescription.

## 2015-12-14 NOTE — Telephone Encounter (Signed)
Rn talk to Mona at [facility] about pt only needs clonazepam per Dr. Erlinda Hong not xanax. [facility] stated they will discontinue the xanax prescribed by Dr.Reddy and will release the clonazepam to patient.They will call the patient when the prescription is ready.

## 2015-12-23 ENCOUNTER — Ambulatory Visit (HOSPITAL_COMMUNITY): Payer: Medicare Other

## 2015-12-31 ENCOUNTER — Ambulatory Visit (HOSPITAL_COMMUNITY)
Admission: RE | Admit: 2015-12-31 | Discharge: 2015-12-31 | Disposition: A | Discharge status: Home / Self Care | Payer: Medicare Other | Source: Ambulatory Visit | Attending: Interventional Radiology | Admitting: Interventional Radiology

## 2015-12-31 DIAGNOSIS — I671 Cerebral aneurysm, nonruptured: Secondary | ICD-10-CM | POA: Diagnosis not present

## 2016-01-04 DIAGNOSIS — G8114 Spastic hemiplegia affecting left nondominant side: Secondary | ICD-10-CM | POA: Diagnosis not present

## 2016-01-11 DIAGNOSIS — Z961 Presence of intraocular lens: Secondary | ICD-10-CM | POA: Diagnosis not present

## 2016-01-20 ENCOUNTER — Ambulatory Visit (HOSPITAL_COMMUNITY): Payer: Medicare Other | Attending: Neurology | Admitting: Physical Therapy

## 2016-01-20 ENCOUNTER — Encounter (HOSPITAL_COMMUNITY): Payer: Self-pay

## 2016-01-20 ENCOUNTER — Ambulatory Visit (HOSPITAL_COMMUNITY): Payer: Medicare Other

## 2016-01-20 DIAGNOSIS — Z7409 Other reduced mobility: Secondary | ICD-10-CM

## 2016-01-20 DIAGNOSIS — M6289 Other specified disorders of muscle: Secondary | ICD-10-CM | POA: Insufficient documentation

## 2016-01-20 DIAGNOSIS — M6281 Muscle weakness (generalized): Secondary | ICD-10-CM

## 2016-01-20 DIAGNOSIS — R279 Unspecified lack of coordination: Secondary | ICD-10-CM | POA: Diagnosis not present

## 2016-01-20 DIAGNOSIS — Z9181 History of falling: Secondary | ICD-10-CM | POA: Diagnosis not present

## 2016-01-20 DIAGNOSIS — G8194 Hemiplegia, unspecified affecting left nondominant side: Secondary | ICD-10-CM | POA: Diagnosis not present

## 2016-01-20 DIAGNOSIS — I6980 Unspecified sequelae of other cerebrovascular disease: Secondary | ICD-10-CM | POA: Insufficient documentation

## 2016-01-20 DIAGNOSIS — IMO0002 Reserved for concepts with insufficient information to code with codable children: Secondary | ICD-10-CM

## 2016-01-20 DIAGNOSIS — R531 Weakness: Secondary | ICD-10-CM

## 2016-01-20 DIAGNOSIS — R270 Ataxia, unspecified: Secondary | ICD-10-CM

## 2016-01-20 DIAGNOSIS — Z789 Other specified health status: Secondary | ICD-10-CM | POA: Diagnosis not present

## 2016-01-20 DIAGNOSIS — R262 Difficulty in walking, not elsewhere classified: Secondary | ICD-10-CM

## 2016-01-20 DIAGNOSIS — R29898 Other symptoms and signs involving the musculoskeletal system: Secondary | ICD-10-CM | POA: Insufficient documentation

## 2016-01-20 DIAGNOSIS — R2681 Unsteadiness on feet: Secondary | ICD-10-CM

## 2016-01-20 DIAGNOSIS — R269 Unspecified abnormalities of gait and mobility: Secondary | ICD-10-CM

## 2016-01-20 NOTE — Therapy (Signed)
[place], [place], [address] Phone: [phone]   Fax:  [phone]  Occupational Therapy Evaluation  Patient Details  Name: Diane Hutchinson MRN: [id number] Date of Birth: Feb 15, 1946 Referring Provider: Dr. Rosalin Hawking  Encounter Date: 01/20/2016      OT End of Session - 01/20/16 1815    Visit Number 1   Number of Visits 1   Authorization Type Medicare (primary) [employer] (secondary)   OT Start Time 1350   OT Stop Time 1425   OT Time Calculation (min) 35 min   Activity Tolerance Patient tolerated treatment well   Behavior During Therapy [facility] for tasks assessed/performed      Past Medical History  Diagnosis Date  . Migraine   . Depression   . Hypertension   . COPD (chronic obstructive pulmonary disease) ([place])   . Anxiety     h/o of panic attack  . GERD (gastroesophageal reflux disease)     occas. use of TUMS  . Arthritis     knees  . Stroke ([place]) 2016    weakness on left side.    Past Surgical History  Procedure Laterality Date  . [place] surgery  2010  . Cholecystectomy      age 60  . Appendectomy      age 66  . Eye surgery  years ago    laser to both eyes for blocked tear ducts   . Brain surgery  1993    aneurysm  . Tubal ligation  years ago  . Hemorroidectomy  years ago  . Radiology with anesthesia N/A 01/20/2015    Procedure: RADIOLOGY WITH ANESTHESIA;  Surgeon: Luanne Bras, MD;  Location: [place];  Service: Radiology;  Laterality: N/A;  . Cerebral aneurysm repair      x3; last one 01-2015.  . Septoplasty    . Cataract extraction w/phaco Right 08/10/2015    Procedure: CATARACT EXTRACTION PHACO AND INTRAOCULAR LENS PLACEMENT (IOC);  Surgeon: Rutherford Guys, MD;  Location: [facility];  Service: Ophthalmology;  Laterality: Right;  CDE:8.19  . Cataract extraction w/phaco Left 08/24/2015    Procedure: CATARACT EXTRACTION PHACO AND INTRAOCULAR LENS PLACEMENT (IOC);  Surgeon: Rutherford Guys,  MD;  Location: [facility];  Service: Ophthalmology;  Laterality: Left;  CDE:6.31    There were no vitals filed for this visit.  Visit Diagnosis:  Lack of coordination due to stroke - Plan: Ot plan of care cert/re-cert  Ataxia - Plan: Ot plan of care cert/re-cert      Subjective Assessment - 01/20/16 1748    Subjective  S: The Doctor wanted me to come.    Patient is accompained by: Family member  Husband   Pertinent History Patient is a 70 y/o female returning to the clinic S/P R CVA on 01/20/15, resulting in left hemiparesis. Pt has a history of multiple intracranial aneurysms with Lt MCA elective embolization using stent assistance 01/20/15. Pt was seen for OT at this clinic 06/02/15-07/28/16. During OT course of treatment patient made slight progress and was discharged after reaching a plateau. Dr. Erlinda Hong has referred patient to occupational therapy for evaluation and treatment.    Special Tests grip and pinch strength. 9 hole peg test.   Patient Stated Goals Non stated.   Currently in Pain? No/denies           [facility] OT Assessment - 01/20/16 1754    Assessment   Diagnosis Left side lack of coordination   Referring Provider  Dr. Rosalin Hawking   Onset Date 01/20/15   Prior Therapy Pt received OT services last year at this facility for present diagnosis.    Precautions   Precautions Fall   Restrictions   Weight Bearing Restrictions No   Balance Screen   Has the patient fallen in the past 6 months Yes   How many times? 4   Has the patient had a decrease in activity level because of a fear of falling?  Yes   Is the patient reluctant to leave their home because of a fear of falling?  Yes   Home  Environment   Family/patient expects to be discharged to: Private residence   Living Arrangements Spouse/significant other   Prior Function   Level of Independence Needs assistance with ADLs;Needs assistance with homemaking;Needs assistance with gait;Needs assistance with transfers   Vocation Retired    Public librarian Status History of falls   Written Expression   Dominant Hand Right   Vision - History   Baseline Vision Wears glasses all the time   Cognition   Overall Cognitive Status Within Functional Limits for tasks assessed   Sensation   Additional Comments Pt reports pain in her left hand fingertips. Pain is similar to pins and needles.    Coordination   Gross Motor Movements are Fluid and Coordinated No   Fine Motor Movements are Fluid and Coordinated No   9 Hole Peg Test Right;Left   Left 9 Hole Peg Test Patient was unable to complete. Pt placed 3 pegs in 5 minutes   Box and Blocks Left: 18   Coordination Decreased   ROM / Strength   AROM / PROM / Strength Strength   Strength   Strength Assessment Site Shoulder;Hand;Elbow   Right/Left Shoulder Left   Left Shoulder Flexion 4-/5   Left Shoulder ABduction 4/5   Left Shoulder Internal Rotation 4-/5   Left Shoulder External Rotation 4/5   Right/Left Elbow Left   Left Elbow Flexion 4+/5   Left Elbow Extension 4/5   Right/Left hand Left   Left Hand Grip (lbs) 25   Left Hand Lateral Pinch 7 lbs   Left Hand 3 Point Pinch 6 lbs                         OT Education - 01/20/16 1812    Education provided Yes   Education Details Discussed patient's performance during today's evaluation and compared it to last reassessment and discharge. Recommended that patient continue to complete hand coordination and strengthening HEP that she was completing previously.    Person(s) Educated Patient;Spouse   Methods Explanation   Comprehension Verbalized understanding          OT Short Term Goals - 01/20/16 1822    OT SHORT TERM GOAL #1   Title Pt will be educated on and independent in HEP.    Time 1   Period Days   Status Achieved                  Plan - 01/20/16 1816    Clinical Impression Statement A: patient is a 70 y/o female S/P R CVA causing decreased coordination, hand strength and motor  ataxia resulting in difficulty completing daily tasks. Assessed patient's current functioning level and compared with previous reassessment. Pt is currently at the level she was at when discharged from OT. Patient was discharged due to reaching a plateau. Recommended patient continue to complete HEP at  home as she has been doing. Patient and husband are agreed.    Pt will benefit from skilled therapeutic intervention in order to improve on the following deficits (Retired) Decreased coordination   Rehab Potential Excellent   OT Frequency One time visit   OT Treatment/Interventions Patient/family education   Consulted and Agree with Plan of Care Patient;Family member/caregiver   Family Member Consulted Husband          G-Codes - 2016-01-24 1823    Functional Assessment Tool Used Clinical judgement   Functional Limitation Carrying, moving and handling objects   Carrying, Moving and Handling Objects Current Status [phone]) At least 80 percent but less than 100 percent impaired, limited or restricted   Carrying, Moving and Handling Objects Goal Status [id number]) At least 80 percent but less than 100 percent impaired, limited or restricted   Carrying, Moving and Handling Objects Discharge Status [phone]) At least 80 percent but less than 100 percent impaired, limited or restricted      Problem List Patient Active Problem List   Diagnosis Date Noted  . Anxiety state 12/09/2015  . Cerebral infarction due to embolism of left carotid artery ([place]) 03/04/2015  . Aneurysm, cerebral, nonruptured 03/04/2015  . History of ruptured arterial aneurysm 03/04/2015  . Essential hypertension 03/04/2015  . HLD (hyperlipidemia) 03/04/2015  . Alterations of sensations following CVA (cerebrovascular accident)   . Embolic cerebral infarction ([place]) 01/22/2015  . Left hemiparesis ([place]) 01/22/2015  . Stroke ([place])   . Cerebral embolism with cerebral infarction ([place]) 01/21/2015  . Ataxia   . Brain aneurysm 24-Jan-2015   . TOBACCO ABUSE 01/04/2010  . BRONCHITIS, CHRONIC 01/04/2010  . CERVICAL CANCER 01/03/2010  . HYPERLIPIDEMIA 01/03/2010  . DEPRESSION 01/03/2010  . SUBARACHNOID HEMORRHAGE 01/03/2010  . [name] 01/03/2010    Ailene Ravel, OTR/L,CBIS  [phone]  2016-01-24, 6:27 PM  [facility] [address] [place], [place], [address] Phone: [phone]   Fax:  [phone]  Name: BRAELEIGH BRONN MRN: [id number] Date of Birth: 1946-06-29

## 2016-01-20 NOTE — Therapy (Signed)
[place], [place], [address] Phone: [phone]   Fax:  [phone]  Physical Therapy Evaluation  Patient Details  Name: Diane Hutchinson MRN: [id number] Date of Birth: July 25, 1946 Referring Provider: Rosalin Hawking   Encounter Date: 01/20/2016      PT End of Session - 01/20/16 1410    Visit Number 1   Number of Visits 20   Date for PT Re-Evaluation 02/17/16   Authorization Type Medicare/[employer]    Authorization Time Period 01/20/16 to 03/21/16   Authorization - Visit Number 1   Authorization - Number of Visits 10   PT Start Time 1302   PT Stop Time 1343   PT Time Calculation (min) 41 min   Equipment Utilized During Treatment Gait belt   Activity Tolerance Patient tolerated treatment well;Patient limited by fatigue   Behavior During Therapy [facility] for tasks assessed/performed      Past Medical History  Diagnosis Date  . Migraine   . Depression   . Hypertension   . COPD (chronic obstructive pulmonary disease) ([place])   . Anxiety     h/o of panic attack  . GERD (gastroesophageal reflux disease)     occas. use of TUMS  . Arthritis     knees  . Stroke ([place]) 2016    weakness on left side.    Past Surgical History  Procedure Laterality Date  . [place] surgery  2010  . Cholecystectomy      age 28  . Appendectomy      age 15  . Eye surgery  years ago    laser to both eyes for blocked tear ducts   . Brain surgery  1993    aneurysm  . Tubal ligation  years ago  . Hemorroidectomy  years ago  . Radiology with anesthesia N/A 01/20/2015    Procedure: RADIOLOGY WITH ANESTHESIA;  Surgeon: Luanne Bras, MD;  Location: [place];  Service: Radiology;  Laterality: N/A;  . Cerebral aneurysm repair      x3; last one 01-2015.  . Septoplasty    . Cataract extraction w/phaco Right 08/10/2015    Procedure: CATARACT EXTRACTION PHACO AND INTRAOCULAR LENS PLACEMENT (IOC);  Surgeon: Rutherford Guys, MD;  Location: [facility];   Service: Ophthalmology;  Laterality: Right;  CDE:8.19  . Cataract extraction w/phaco Left 08/24/2015    Procedure: CATARACT EXTRACTION PHACO AND INTRAOCULAR LENS PLACEMENT (IOC);  Surgeon: Rutherford Guys, MD;  Location: [facility];  Service: Ophthalmology;  Laterality: Left;  CDE:6.31    There were no vitals filed for this visit.  Visit Diagnosis:  Left-sided weakness - Plan: PT plan of care cert/re-cert  Lack of coordination due to stroke - Plan: PT plan of care cert/re-cert  Hemiparesis, left ([place]) - Plan: PT plan of care cert/re-cert  Difficulty walking - Plan: PT plan of care cert/re-cert  Abnormality of gait - Plan: PT plan of care cert/re-cert  Impaired functional mobility and activity tolerance - Plan: PT plan of care cert/re-cert  Risk for falls - Plan: PT plan of care cert/re-cert  Muscle weakness - Plan: PT plan of care cert/re-cert  Unsteadiness - Plan: PT plan of care cert/re-cert      Subjective Assessment - 01/20/16 1314    Subjective Patient and husband reports that since she was discharged from skilled PT services, she had been falling quite a bit; patient reports that she has gotten more independent in getting up on bed, and had been walking more  by herself, BUT also falling more. Husband reports that they have not found injury from falls but patient has had feelings of muscle spasm on L side.  Her doctor has since referred her to PT to keep working on balance and help to reduce fall risk.    Pertinent History aneurysm, stroke, HTN, COPD, subdural hemmorhage, L hemiparesis, hisotry of brain surgery with stent in place    How long can you stand comfortably? varies with quad cane; one day she can stand awhile (does not know how long), some days she cannot    How long can you walk comfortably? about 40-50 feet with quad cane    Patient Stated Goals wants to get hand and get  legs working    Currently in Pain? No/denies            [facility] PT Assessment - 01/20/16 0001     Assessment   Medical Diagnosis cerebral infarction due to emboilsm of L carotid artery    Referring Provider Rosalin Hawking    Onset Date/Surgical Date --  March 9th 2016   Next MD Visit Dr. Erlinda Hong May 1st 2017    Precautions   Precautions Fall;Other (comment)  stent in brain    Precaution Comments stent in brain, L hemiparesis    Restrictions   Weight Bearing Restrictions No   Balance Screen   Has the patient fallen in the past 6 months Yes   How many times? 4- husband reports taht 1 was a major concern, rest were sliding off of bed    Has the patient had a decrease in activity level because of a fear of falling?  Yes   Is the patient reluctant to leave their home because of a fear of falling?  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, L hemiparesis, flexed at hips, reduced stance L/step R, unsteadiness, reduced gait speed, quick to fatigue, proximal weakness    Strength   Right Hip Flexion 4/5   Right Hip Extension 3-/5   Right Hip ABduction 3-/5   Left Hip Flexion 3+/5   Left Hip Extension 2/5   Left Hip ABduction 2+/5   Right Knee Flexion 4/5   Right Knee Extension 4+/5   Left Knee Flexion 3-/5   Left Knee Extension 3/5   Right Ankle Dorsiflexion 4+/5   Left Ankle Dorsiflexion 3-/5   Bed Mobility   Rolling Right 6: Modified independent (Device/Increase time)   Rolling Left 6: Modified independent (Device/Increase time)   Supine to Sit 4: Min guard   Sit to Supine 4: Min guard   Transfers   Sit to Stand 4: Min guard   Stand to Sit 4: Min guard   Stand Pivot Transfers 4: Min guard   Comments quad cane for all    Ambulation/Gait   Gait Comments with quad cane: unsteadiness, proximal msucle weakness, poor posture, reduced rotation of trunk and pelvis, difficulty navigating  corners and obstacles, core weakness   Min guard-Min(A)x2   6 minute walk test results    Aerobic Endurance Distance Walked 40   Endurance additional comments with quad cane, Min guard-Min(A) x2; fatigue noted and required seated rest break  PT Education - 01/20/16 1409    Education provided Yes   Education Details keep up with current exercises at home and HEP witll be updated next session, plan of care, prognosis   Person(s) Educated Patient;Spouse   Methods Explanation   Comprehension Verbalized understanding          PT Short Term Goals - 01/20/16 1419    PT SHORT TERM GOAL #1   Title Patient to be able to complete  TUG balance test in 20 seconds or less with LRAD and min guard in order to demonstrate improved functional balance and reduced fall risk    Time 4   Period Weeks   Status New   PT SHORT TERM GOAL #2   Title Patient to be able to ambulate at least 124ft with LRAD and min guard, minimal fatigue in order to enhance functional activity tolerance and assist in safety with community mobility    Time 4   Period Weeks   Status New   PT SHORT TERM GOAL #3   Title Patient to be mod(I) in all functional mobility and transfers with LRAD in order to reduce fall risk at home and improve general mobility    Time 4   Period Weeks   Status New   PT SHORT TERM GOAL #4   Title Patient will be able to independently, correctly, and consistently  verbally describe and physically adhere to safety precautions at home and in community in order to reduce fall and injury risk with mobitily    Time 4   Period Weeks   Status New   PT SHORT TERM GOAL #5   Title Patient will be independent in correctly and consistently performing appropriate HEP, to be updated PRN    Time 4   Period Weeks   Status New           PT Long Term Goals - 01/20/16 1423    PT LONG TERM GOAL #1   Title Patient to demonstrate strength 4/5 in all tested muscle  groups in order to assist in improving general stability and to reduce fall risk    Time 8   Period Weeks   Status New   PT LONG TERM GOAL #2   Title Patient to score at least a 45 on the BERG in order to demosntrate improved balance skills and to demonstrate reduced overall fall risk    Time 8   Period Weeks   Status New   PT LONG TERM GOAL #3   Title Patient to be able to ambulate at least 258ft with LRAD and supervision, minimal fatigue and fall risk in order to assist in improving general safe mobility and to allow her to get into doctors offices more easily    Time 8   Period Weeks   Status New   PT LONG TERM GOAL #4   Title Patient will report no falls within the past 3 weeks in order to demonstrate improved safety and mobility overall    Time 8   Period Weeks   Status New   PT LONG TERM GOAL #5   Title Patient's husband will demonstrate independence in provinding safe guarding and assistance with mobility in order to enahnce overall mobilty and safety for both patient and her husband at home    Time [place] - 01/20/16 1411  Clinical Impression Statement Patient arrives reporting that her doctor has sent her back to PT to work on her walking and to help prevent falls as this has been happening quite a bit recently; since her discharge from skilled PT services last year, she appears to have become more mobile and is now able to ambulate into clinic using quad canea nd with the assist of her husband. Reports that she has good days and bad days, today is actually a pretty good day.  Patient and husband very talkative throughout session in general. Upon examination, patient does reveal siginificant muscle weakness, gait and postual impairment, unsteadiness with large fall risk, poor functional activity tolerance, impairments in safety awareness, decreased functional mobilty, and in general reduced functional task performance skills. At  this time recommend skilled PT services in order to address functional impairemnts and assist in reaching optimal level of function.     Pt will benefit from skilled therapeutic intervention in order to improve on the following deficits Abnormal gait;Impaired tone;Decreased activity tolerance;Decreased strength;Decreased balance;Decreased mobility;Difficulty walking;Improper body mechanics;Decreased coordination;Postural dysfunction   Rehab Potential Good   PT Frequency Other (comment)  4x/week for 3 weeks, 2x/week for next 4 weeks    PT Duration 8 weeks   PT Treatment/Interventions ADLs/Self Care Home Management;Biofeedback;Electrical Stimulation;DME Instruction;Gait training;Stair training;Functional mobility training;Therapeutic activities;Therapeutic exercise;Balance training;Neuromuscular re-education;Patient/family education;Manual techniques;Energy conservation;Taping   PT Next Visit Plan review goals/initial eval; assign/update HEP if needed; functional strength, balance, gait, safety training all with LRAD    PT Home Exercise Plan assign next session    Consulted and Agree with Plan of Care Patient          G-Codes - 2016-02-09 1447    Functional Assessment Tool Used Based on skilled clinical assessment of strength, gait, balance, functional mobility/activity tolerance, general safety    Functional Limitation Mobility: Walking and moving around   Mobility: Walking and Moving Around Current Status [id number]) At least 60 percent but less than 80 percent impaired, limited or restricted   Mobility: Walking and Moving Around Goal Status [phone]) At least 40 percent but less than 60 percent impaired, limited or restricted       Problem List Patient Active Problem List   Diagnosis Date Noted  . Anxiety state 12/09/2015  . Cerebral infarction due to embolism of left carotid artery ([place]) 03/04/2015  . Aneurysm, cerebral, nonruptured 03/04/2015  . History of ruptured arterial aneurysm  03/04/2015  . Essential hypertension 03/04/2015  . HLD (hyperlipidemia) 03/04/2015  . Alterations of sensations following CVA (cerebrovascular accident)   . Embolic cerebral infarction ([place]) 01/22/2015  . Left hemiparesis ([place]) 01/22/2015  . Stroke ([place])   . Cerebral embolism with cerebral infarction ([place]) 01/21/2015  . Ataxia   . Brain aneurysm 2015/02/09  . TOBACCO ABUSE 01/04/2010  . BRONCHITIS, CHRONIC 01/04/2010  . CERVICAL CANCER 01/03/2010  . HYPERLIPIDEMIA 01/03/2010  . DEPRESSION 01/03/2010  . SUBARACHNOID HEMORRHAGE 01/03/2010  . [name] 01/03/2010    Deniece Ree PT, DPT [place] [address] [place], [place], [address] Phone: [phone]   Fax:  [phone]  Name: Diane Hutchinson MRN: [id number] Date of Birth: 16-Apr-1946

## 2016-01-31 ENCOUNTER — Encounter (HOSPITAL_COMMUNITY): Payer: Medicare Other | Admitting: Physical Therapy

## 2016-02-01 ENCOUNTER — Ambulatory Visit (HOSPITAL_COMMUNITY): Payer: Medicare Other | Admitting: Physical Therapy

## 2016-02-01 DIAGNOSIS — Z7409 Other reduced mobility: Secondary | ICD-10-CM

## 2016-02-01 DIAGNOSIS — R2681 Unsteadiness on feet: Secondary | ICD-10-CM

## 2016-02-01 DIAGNOSIS — R279 Unspecified lack of coordination: Secondary | ICD-10-CM | POA: Diagnosis not present

## 2016-02-01 DIAGNOSIS — R262 Difficulty in walking, not elsewhere classified: Secondary | ICD-10-CM | POA: Diagnosis not present

## 2016-02-01 DIAGNOSIS — R269 Unspecified abnormalities of gait and mobility: Secondary | ICD-10-CM | POA: Diagnosis not present

## 2016-02-01 DIAGNOSIS — Z9181 History of falling: Secondary | ICD-10-CM

## 2016-02-01 DIAGNOSIS — G8194 Hemiplegia, unspecified affecting left nondominant side: Secondary | ICD-10-CM | POA: Diagnosis not present

## 2016-02-01 DIAGNOSIS — Z789 Other specified health status: Secondary | ICD-10-CM

## 2016-02-01 DIAGNOSIS — R29898 Other symptoms and signs involving the musculoskeletal system: Secondary | ICD-10-CM

## 2016-02-01 DIAGNOSIS — I6980 Unspecified sequelae of other cerebrovascular disease: Secondary | ICD-10-CM | POA: Diagnosis not present

## 2016-02-01 DIAGNOSIS — R270 Ataxia, unspecified: Secondary | ICD-10-CM

## 2016-02-01 DIAGNOSIS — IMO0002 Reserved for concepts with insufficient information to code with codable children: Secondary | ICD-10-CM

## 2016-02-01 DIAGNOSIS — M6289 Other specified disorders of muscle: Secondary | ICD-10-CM | POA: Diagnosis not present

## 2016-02-01 DIAGNOSIS — R531 Weakness: Secondary | ICD-10-CM

## 2016-02-01 DIAGNOSIS — M6281 Muscle weakness (generalized): Secondary | ICD-10-CM

## 2016-02-01 NOTE — Therapy (Signed)
[facility] [address] [place], [place], [address] Phone: [phone]   Fax:  [phone]  Physical Therapy Treatment  Patient Details  Name: Diane Hutchinson MRN: [id number] Date of Birth: [birth year] Referring Provider: Rosalin Hawking   Encounter Date: 02/01/2016      PT End of Session - 02/01/16 1544    Visit Number 2   Number of Visits 20   Date for PT Re-Evaluation 02/17/16   Authorization Type Medicare/[employer]    Authorization Time Period 01/20/16 to 03/21/16   Authorization - Visit Number 2   Authorization - Number of Visits 10   PT Start Time 1505   PT Stop Time 1555   PT Time Calculation (min) 50 min   Equipment Utilized During Treatment Gait belt   Activity Tolerance Patient limited by pain   Behavior During Therapy [facility] for tasks assessed/performed      Past Medical History  Diagnosis Date  . Migraine   . Depression   . Hypertension   . COPD (chronic obstructive pulmonary disease) ([place])   . Anxiety     h/o of panic attack  . GERD (gastroesophageal reflux disease)     occas. use of TUMS  . Arthritis     knees  . Stroke ([place]) 2016    weakness on left side.    Past Surgical History  Procedure Laterality Date  . [place] surgery  2010  . Cholecystectomy      age 74  . Appendectomy      age 72  . Eye surgery  years ago    laser to both eyes for blocked tear ducts   . Brain surgery  1993    aneurysm  . Tubal ligation  years ago  . Hemorroidectomy  years ago  . Radiology with anesthesia N/A 01/20/2015    Procedure: RADIOLOGY WITH ANESTHESIA;  Surgeon: Luanne Bras, MD;  Location: [place];  Service: Radiology;  Laterality: N/A;  . Cerebral aneurysm repair      x3; last one 01-2015.  . Septoplasty    . Cataract extraction w/phaco Right 08/10/2015    Procedure: CATARACT EXTRACTION PHACO AND INTRAOCULAR LENS PLACEMENT (IOC);  Surgeon: Rutherford Guys, MD;  Location: [facility];  Service: Ophthalmology;   Laterality: Right;  CDE:8.19  . Cataract extraction w/phaco Left 08/24/2015    Procedure: CATARACT EXTRACTION PHACO AND INTRAOCULAR LENS PLACEMENT (IOC);  Surgeon: Rutherford Guys, MD;  Location: [facility];  Service: Ophthalmology;  Laterality: Left;  CDE:6.31    There were no vitals filed for this visit.  Visit Diagnosis:  Lack of coordination due to stroke  Muscle weakness  Ataxia  Unsteadiness  Left-sided weakness  Decreased pinch strength  Hemiparesis, left ([facility])  Decreased grip strength of left hand  Difficulty walking  Decreased activities of daily living (ADL)  Abnormality of gait  Impaired functional mobility and activity tolerance  Risk for falls      Subjective Assessment - 02/01/16 1453    Subjective Pt states she is not having a good day she is does not seem liike she can control her leg today.  Pt states that she has almost fallen three times today    Currently in Pain? Yes   Pain Score 5    Pain Location Knee   Pain Orientation Left   Pain Descriptors / Indicators Throbbing   Pain Onset More than a month ago   Pain Frequency Intermittent   Aggravating Factors  weight bearing  Pain Relieving Factors rest    Multiple Pain Sites Yes   Pain Score 5   Pain Location Ankle   Pain Orientation Left   Pain Descriptors / Indicators Throbbing   Pain Type Chronic pain   Pain Onset More than a month ago   Pain Frequency Intermittent   Aggravating Factors  weight bearing    Pain Relieving Factors rest                          [facility] Adult PT Treatment/Exercise - 02/01/16 1504    Ambulation/Gait   Ambulation/Gait Yes   Ambulation/Gait Assistance 4: Min assist   Ambulation Distance (Feet) 50 Feet   Assistive device Small based quad cane   Gait Comments verbal cuing to put all four legs down at the same time.    Exercises   Exercises Lumbar;Knee/Hip   Lumbar Exercises: Supine   Clam 10 reps   Bent Knee Raise 10 reps   Bridge 10 reps    Knee/Hip Exercises: Seated   Sit to Sand 10 reps   Knee/Hip Exercises: Supine   Quad Sets 10 reps   Heel Slides Left;10 reps   Hip Adduction Isometric Both;10 reps   Other Supine Knee/Hip Exercises Hip abduction x10        Educated pt in home exercise program. Pt given handout            PT Short Term Goals - 02/01/16 1614    PT SHORT TERM GOAL #1   Title Patient to be able to complete  TUG balance test in 20 seconds or less with LRAD and min guard in order to demonstrate improved functional balance and reduced fall risk    Baseline Now supervision-min A depending on level of fatigue   Time 4   Period Weeks   Status On-going   PT SHORT TERM GOAL #2   Title Patient to be able to ambulate at least 131ft with LRAD and min guard, minimal fatigue in order to enhance functional activity tolerance and assist in safety with community mobility    Baseline Supervision   Time 4   Period Weeks   Status On-going   PT SHORT TERM GOAL #3   Title Patient to be mod(I) in all functional mobility and transfers with LRAD in order to reduce fall risk at home and improve general mobility    Time 4   Period Weeks   Status On-going   PT SHORT TERM GOAL #4   Title Patient will be able to independently, correctly, and consistently  verbally describe and physically adhere to safety precautions at home and in community in order to reduce fall and injury risk with mobitily    Baseline 9/12: completed in 29" with one UE support   Time 4   Period Weeks   Status On-going   PT SHORT TERM GOAL #5   Title Patient will be independent in correctly and consistently performing appropriate HEP, to be updated PRN    Time 4   Period Weeks   Status On-going           PT Long Term Goals - 02/01/16 1615    PT LONG TERM GOAL #1   Title Patient to demonstrate strength 4/5 in all tested muscle groups in order to assist in improving general stability and to reduce fall risk    Baseline Mod assist   Time 8    Period Weeks   Status On-going  PT LONG TERM GOAL #2   Title Patient to score at least a 45 on the BERG in order to demosntrate improved balance skills and to demonstrate reduced overall fall risk    Baseline 8 feet min assist   Time 8   Period Weeks   Status On-going   PT LONG TERM GOAL #3   Title Patient to be able to ambulate at least 217ft with LRAD and supervision, minimal fatigue and fall risk in order to assist in improving general safe mobility and to allow her to get into doctors offices more easily    Time 8   Period Weeks   Status On-going   PT LONG TERM GOAL #4   Title Patient will report no falls within the past 3 weeks in order to demonstrate improved safety and mobility overall    Time 8   Period Weeks   Status On-going   PT LONG TERM GOAL #5   Title Patient's husband will demonstrate independence in provinding safe guarding and assistance with mobility in order to enahnce overall mobilty and safety for both patient and her husband at home    Time 8   Period Weeks   Status On-going               Plan - 02/01/16 1609    Clinical Impression Statement Pt evaluation and goals reviewed by therapist with patient and her husband.  Pt has significant increased pain and crepitus in left knee and ankle with weightbearing causing knee to give way.  Pt  needing constant verbal cuing to place all four legs of quad cane down with ambulation.  Pt needing moderate assist with ambulation today for safety.  Due to significant knee pain therapit was completed in open chained position with the hopes of strenghening the muscles enough to decrease pain with weightbearing.    Pt will benefit from skilled therapeutic intervention in order to improve on the following deficits Abnormal gait;Impaired tone;Decreased activity tolerance;Decreased strength;Decreased balance;Decreased mobility;Difficulty walking;Improper body mechanics;Decreased coordination;Postural dysfunction   PT Duration  8 weeks   PT Treatment/Interventions ADLs/Self Care Home Management;Biofeedback;Electrical Stimulation;DME Instruction;Gait training;Stair training;Functional mobility training;Therapeutic activities;Therapeutic exercise;Balance training;Neuromuscular re-education;Patient/family education;Manual techniques;Energy conservation;Taping   PT Next Visit Plan complete closed chain activity as able but be aware of pain level of pt knee.    PT Home Exercise Plan given        Problem List Patient Active Problem List   Diagnosis Date Noted  . Anxiety state 12/09/2015  . Cerebral infarction due to embolism of left carotid artery ([place]) 03/04/2015  . Aneurysm, cerebral, nonruptured 03/04/2015  . History of ruptured arterial aneurysm 03/04/2015  . Essential hypertension 03/04/2015  . HLD (hyperlipidemia) 03/04/2015  . Alterations of sensations following CVA (cerebrovascular accident)   . Embolic cerebral infarction ([place]) 01/22/2015  . Left hemiparesis ([place]) 01/22/2015  . Stroke ([place])   . Cerebral embolism with cerebral infarction ([place]) 01/21/2015  . Ataxia   . Brain aneurysm 01/20/2015  . TOBACCO ABUSE 01/04/2010  . BRONCHITIS, CHRONIC 01/04/2010  . CERVICAL CANCER 01/03/2010  . HYPERLIPIDEMIA 01/03/2010  . DEPRESSION 01/03/2010  . SUBARACHNOID HEMORRHAGE 01/03/2010  . [name] 01/03/2010    Rayetta Humphrey, PT CLT [phone] 02/01/2016, 4:16 PM  [facility] [address] [place], [place], [address] Phone: [phone]   Fax:  [phone]  Name: BERNESTINE NALLEY MRN: [id number] Date of Birth: Aug 19, 1946

## 2016-02-01 NOTE — Patient Instructions (Signed)
Strengthening: Quadriceps Set    Tighten muscles on top of thighs by pushing knees down into surface. Hold __3__ seconds. Repeat 20____ times per set. Do __1__ sets per session. Do __2__ sessions per day.  http://orth.exer.us/602   Copyright  VHI. All rights reserved.  Knee Extension (Sitting)    Place __0__ pound weight on left ankle and straighten knee fully, lower slowly. Repeat ___10_ times per set. Do 1____ sets per session. Do ___2_ sessions per day.  http://orth.exer.us/732   Strengthening: Hip Adduction - Isometric    With ball or folded pillow between knees, squeeze knees together. Hold _3___ seconds. Repeat _15___ times per set. Do __1__ sets per session. Do _2___ sessions per day.  http://orth.exer.us/612   Copyright  VHI. All rights reserved.  Bridging    Slowly raise buttocks from floor, keeping stomach tight. Repeat _10___ times per set. Do ___1_ sets per session. Do __2__ sessions per day.  http://orth.exer.us/1096   Copyright  VHI. All rights reserved.  Hip Abduction / Adduction: with Extended Knee (Supine)    Bring left leg out to side and return. Keep knee straight. Repeat __10__ times per set. Do __1__ sets per session. Do _2___ sessions per day.  http://orth.exer.us/680   Copyright  VHI. All rights reserved.  Hip Abduction / Adduction: with Knee Flexion (Supine)    With right knee bent, gently lower knee to side and return. Repeat _10___ times per set. Do _1___ sets per session. Do ____2 sessions per day. 1 http://orth.exer.us/682   Copyright  VHI. All rights reserved.  Isometric Abdominal    Lying on back with knees bent, tighten stomach by pressing elbows down. Hold _3___ seconds. Repeat _10___ times per set. Do _1___ sets per session. Do __2__ sessions per day.  http://orth.exer.us/1086   Copyright  VHI. All rights reserved.

## 2016-02-02 ENCOUNTER — Ambulatory Visit (HOSPITAL_COMMUNITY): Payer: Medicare Other | Admitting: Physical Therapy

## 2016-02-02 DIAGNOSIS — I6980 Unspecified sequelae of other cerebrovascular disease: Secondary | ICD-10-CM | POA: Diagnosis not present

## 2016-02-02 DIAGNOSIS — R262 Difficulty in walking, not elsewhere classified: Secondary | ICD-10-CM | POA: Diagnosis not present

## 2016-02-02 DIAGNOSIS — R531 Weakness: Secondary | ICD-10-CM

## 2016-02-02 DIAGNOSIS — IMO0002 Reserved for concepts with insufficient information to code with codable children: Secondary | ICD-10-CM

## 2016-02-02 DIAGNOSIS — R2681 Unsteadiness on feet: Secondary | ICD-10-CM

## 2016-02-02 DIAGNOSIS — R279 Unspecified lack of coordination: Secondary | ICD-10-CM | POA: Diagnosis not present

## 2016-02-02 DIAGNOSIS — M6289 Other specified disorders of muscle: Secondary | ICD-10-CM | POA: Diagnosis not present

## 2016-02-02 DIAGNOSIS — R269 Unspecified abnormalities of gait and mobility: Secondary | ICD-10-CM | POA: Diagnosis not present

## 2016-02-02 DIAGNOSIS — G8194 Hemiplegia, unspecified affecting left nondominant side: Secondary | ICD-10-CM | POA: Diagnosis not present

## 2016-02-02 DIAGNOSIS — M6281 Muscle weakness (generalized): Secondary | ICD-10-CM

## 2016-02-02 DIAGNOSIS — R270 Ataxia, unspecified: Secondary | ICD-10-CM

## 2016-02-02 NOTE — Therapy (Signed)
[facility] [address] [place], [place], [address] Phone: [phone]   Fax:  [phone]  Physical Therapy Treatment  Patient Details  Name: NYAH KAO MRN: [id number] Date of Birth: Sep 18, 1946 Referring Provider: Rosalin Hawking   Encounter Date: 02/02/2016      PT End of Session - 02/02/16 1547    Visit Number 3   Number of Visits 20   Date for PT Re-Evaluation 02/17/16   Authorization Type Medicare/[employer]    Authorization Time Period 01/20/16 to 03/21/16   Authorization - Visit Number 3   Authorization - Number of Visits 10   PT Start Time [id number]   PT Stop Time 1521   PT Time Calculation (min) 50 min   Equipment Utilized During Treatment Gait belt   Activity Tolerance Patient limited by pain   Behavior During Therapy [facility] for tasks assessed/performed      Past Medical History  Diagnosis Date  . Migraine   . Depression   . Hypertension   . COPD (chronic obstructive pulmonary disease) ([place])   . Anxiety     h/o of panic attack  . GERD (gastroesophageal reflux disease)     occas. use of TUMS  . Arthritis     knees  . Stroke ([place]) 2016    weakness on left side.    Past Surgical History  Procedure Laterality Date  . [place] surgery  2010  . Cholecystectomy      age 70  . Appendectomy      age 70  . Eye surgery  years ago    laser to both eyes for blocked tear ducts   . Brain surgery  1993    aneurysm  . Tubal ligation  years ago  . Hemorroidectomy  years ago  . Radiology with anesthesia N/A 01/20/2015    Procedure: RADIOLOGY WITH ANESTHESIA;  Surgeon: Luanne Bras, MD;  Location: [facility];  Service: Radiology;  Laterality: N/A;  . Cerebral aneurysm repair      x3; last one 01-2015.  . Septoplasty    . Cataract extraction w/phaco Right 08/10/2015    Procedure: CATARACT EXTRACTION PHACO AND INTRAOCULAR LENS PLACEMENT (IOC);  Surgeon: Rutherford Guys, MD;  [facility];  Service: Ophthalmology;   Laterality: Right;  CDE:8.19  . Cataract extraction w/phaco Left 08/24/2015    Procedure: CATARACT EXTRACTION PHACO AND INTRAOCULAR LENS PLACEMENT (IOC);  Surgeon: Rutherford Guys, MD;  Location: [facility];  Service: Ophthalmology;  Laterality: Left;  CDE:6.31    There were no vitals filed for this visit.  Visit Diagnosis:  Lack of coordination due to stroke  Muscle weakness  Ataxia  Unsteadiness  Left-sided weakness      Subjective Assessment - 02/02/16 1437    Subjective Patient reports she is "just doing alright" today; she is still having trouble with her L knee being painful and popping, they have not been able to get an MD to "do anything about it" per patient's husband.    Currently in Pain? Yes   Pain Score 1    Pain Location Knee   Pain Orientation Left                         [facility] Adult PT Treatment/Exercise - 02/02/16 0001    Ambulation/Gait   Ambulation/Gait Yes   Ambulation/Gait Assistance 4: Min guard   Ambulation Distance (Feet) 50 Feet   Assistive device Hemi-walker   Pre-Gait Activities lateral  weight shfits with wide BOS, AP weight shifts; tandem stance    Gait Comments cues for safety, occasional Min(A)   Lumbar Exercises: Stretches   Passive Hamstring Stretch 3 reps;30 seconds   Passive Hamstring Stretch Limitations bilateral; lateral pressure on L patella to reduce knee pain during stretch    Lumbar Exercises: Standing   Heel Raises 15 reps   Heel Raises Limitations toe and heel, B UEs; difficulty with form toe raises    Lumbar Exercises: Seated   Long Arc Quad on Chair Both;1 set;5 reps   LAQ on Chair Weights (lbs) 2   Lumbar Exercises: Supine   Bridge 10 reps   Bridge Limitations anchor at feet, bilateral support of knees for ABD    Lumbar Exercises: Sidelying   Clam 10 reps   Clam Limitations red TB, cues for form; no TB on L LE                 PT Education - 02/02/16 1546    Education provided Yes   Education Details  requested patient bring knee brace for weightbearing next session    Person(s) Educated Patient;Spouse   Methods Explanation   Comprehension Verbalized understanding          PT Short Term Goals - 02/01/16 1614    PT SHORT TERM GOAL #1   Title Patient to be able to complete  TUG balance test in 20 seconds or less with LRAD and min guard in order to demonstrate improved functional balance and reduced fall risk    Baseline Now supervision-min A depending on level of fatigue   Time 4   Period Weeks   Status On-going   PT SHORT TERM GOAL #2   Title Patient to be able to ambulate at least 118ft with LRAD and min guard, minimal fatigue in order to enhance functional activity tolerance and assist in safety with community mobility    Baseline Supervision   Time 4   Period Weeks   Status On-going   PT SHORT TERM GOAL #3   Title Patient to be mod(I) in all functional mobility and transfers with LRAD in order to reduce fall risk at home and improve general mobility    Time 4   Period Weeks   Status On-going   PT SHORT TERM GOAL #4   Title Patient will be able to independently, correctly, and consistently  verbally describe and physically adhere to safety precautions at home and in community in order to reduce fall and injury risk with mobitily    Baseline 9/12: completed in 29" with one UE support   Time 4   Period Weeks   Status On-going   PT SHORT TERM GOAL #5   Title Patient will be independent in correctly and consistently performing appropriate HEP, to be updated PRN    Time 4   Period Weeks   Status On-going           PT Long Term Goals - 02/01/16 1615    PT LONG TERM GOAL #1   Title Patient to demonstrate strength 4/5 in all tested muscle groups in order to assist in improving general stability and to reduce fall risk    Baseline Mod assist   Time 8   Period Weeks   Status On-going   PT LONG TERM GOAL #2   Title Patient to score at least a 45 on the BERG in order to  demosntrate improved balance skills and to demonstrate reduced overall fall  risk    Baseline 8 feet min assist   Time 8   Period Weeks   Status On-going   PT LONG TERM GOAL #3   Title Patient to be able to ambulate at least 233ft with LRAD and supervision, minimal fatigue and fall risk in order to assist in improving general safe mobility and to allow her to get into doctors offices more easily    Time 8   Period Weeks   Status On-going   PT LONG TERM GOAL #4   Title Patient will report no falls within the past 3 weeks in order to demonstrate improved safety and mobility overall    Time 8   Period Weeks   Status On-going   PT LONG TERM GOAL #5   Title Patient's husband will demonstrate independence in provinding safe guarding and assistance with mobility in order to enahnce overall mobilty and safety for both patient and her husband at home    Time 8   Period Weeks   Status On-going               Plan - 02/02/16 1547    Clinical Impression Statement Patient arrived continuing to complain of L knee pain; upon palpation, identified defnite grinding and popping, also needed to utilize pressure to mobilze patella medially during hamstring stretch today to reduce pain. Performed OKC and CKC exercises to tolerance today, however did note significant popping and feelings of grinding today with weight bearing activities, limited aggression of activities today due to pain. Ambulated with hemiwalker at end of session today with min guard to Min(A), occasional LOB.    Pt will benefit from skilled therapeutic intervention in order to improve on the following deficits Abnormal gait;Impaired tone;Decreased activity tolerance;Decreased strength;Decreased balance;Decreased mobility;Difficulty walking;Improper body mechanics;Decreased coordination;Postural dysfunction   Rehab Potential Good   PT Frequency Other (comment)   PT Duration 8 weeks   PT Treatment/Interventions ADLs/Self Care Home  Management;Biofeedback;Electrical Stimulation;DME Instruction;Gait training;Stair training;Functional mobility training;Therapeutic activities;Therapeutic exercise;Balance training;Neuromuscular re-education;Patient/family education;Manual techniques;Energy conservation;Taping   PT Next Visit Plan complete closed chain activity as able but be aware of pain level of pt knee.    PT Home Exercise Plan given   Consulted and Agree with Plan of Care Patient        Problem List Patient Active Problem List   Diagnosis Date Noted  . Anxiety state 12/09/2015  . Cerebral infarction due to embolism of left carotid artery ([place]) 03/04/2015  . Aneurysm, cerebral, nonruptured 03/04/2015  . History of ruptured arterial aneurysm 03/04/2015  . Essential hypertension 03/04/2015  . HLD (hyperlipidemia) 03/04/2015  . Alterations of sensations following CVA (cerebrovascular accident)   . Embolic cerebral infarction ([place]) 01/22/2015  . Left hemiparesis ([place]) 01/22/2015  . Stroke ([place])   . Cerebral embolism with cerebral infarction ([place]) 01/21/2015  . Ataxia   . Brain aneurysm 01/20/2015  . TOBACCO ABUSE 01/04/2010  . BRONCHITIS, CHRONIC 01/04/2010  . CERVICAL CANCER 01/03/2010  . HYPERLIPIDEMIA 01/03/2010  . DEPRESSION 01/03/2010  . SUBARACHNOID HEMORRHAGE 01/03/2010  . [name] 01/03/2010    Deniece Ree PT, DPT [facility] [address] [place], [place], [address] Phone: [phone]   Fax:  [phone]  Name: NIEKA BLAES MRN: [id number] Date of Birth: 10-21-1946

## 2016-02-03 ENCOUNTER — Ambulatory Visit (HOSPITAL_COMMUNITY): Payer: Medicare Other | Admitting: Physical Therapy

## 2016-02-03 DIAGNOSIS — R531 Weakness: Secondary | ICD-10-CM

## 2016-02-03 DIAGNOSIS — R270 Ataxia, unspecified: Secondary | ICD-10-CM

## 2016-02-03 DIAGNOSIS — R2681 Unsteadiness on feet: Secondary | ICD-10-CM

## 2016-02-03 DIAGNOSIS — R279 Unspecified lack of coordination: Secondary | ICD-10-CM | POA: Diagnosis not present

## 2016-02-03 DIAGNOSIS — I6980 Unspecified sequelae of other cerebrovascular disease: Secondary | ICD-10-CM | POA: Diagnosis not present

## 2016-02-03 DIAGNOSIS — G8194 Hemiplegia, unspecified affecting left nondominant side: Secondary | ICD-10-CM | POA: Diagnosis not present

## 2016-02-03 DIAGNOSIS — M6281 Muscle weakness (generalized): Secondary | ICD-10-CM

## 2016-02-03 DIAGNOSIS — R269 Unspecified abnormalities of gait and mobility: Secondary | ICD-10-CM | POA: Diagnosis not present

## 2016-02-03 DIAGNOSIS — IMO0002 Reserved for concepts with insufficient information to code with codable children: Secondary | ICD-10-CM

## 2016-02-03 DIAGNOSIS — M6289 Other specified disorders of muscle: Secondary | ICD-10-CM | POA: Diagnosis not present

## 2016-02-03 DIAGNOSIS — R262 Difficulty in walking, not elsewhere classified: Secondary | ICD-10-CM | POA: Diagnosis not present

## 2016-02-03 NOTE — Patient Instructions (Signed)
   Straight Leg Raise w/ ER  Laying flat on your back and keeping the unaffected knee bent, squeeze the quad of the affected leg and slightly rotated it out.  Slowly raise it up to the level of your bent leg, then slowly return to starting position  You knee should stay straight the entire time. It is VERY important to keep your toes rotated out.  Repeat 10 times, twice a day.    MEDICINE BALL BRIDGE  While lying on your back, raise your buttocks off the floor/bed while holding a medicine ball between your knees as shown.  Repeat 10 times, twice a day, making sure you are holding the ball with your knees.    Your husbnad will need to help you with this one.   Take the knee and hip up so they are both bent, and then put your thumb on the outside of the kneecap, using pressuring to gently push it back in towards the middle of her knee. DO not push too hard, just enough that she does not have pain.  HOld 30 seconds at a time, 3 times Left leg.

## 2016-02-03 NOTE — Therapy (Signed)
[facility] [address] [place], [place], [address] Phone: [phone]   Fax:  [phone]  Physical Therapy Treatment  Patient Details  Name: Diane Hutchinson MRN: [id number] Date of Birth: 05-11-1946 Referring Provider: Rosalin Hawking   Encounter Date: 02/03/2016      PT End of Session - 02/03/16 1038    Visit Number 4   Number of Visits 20   Date for PT Re-Evaluation 02/17/16   Authorization Type Medicare/[employer]    Authorization Time Period 01/20/16 to 03/21/16   Authorization - Visit Number 4   Authorization - Number of Visits 10   PT Start Time 0930   PT Stop Time 1017   PT Time Calculation (min) 47 min   Equipment Utilized During Treatment Gait belt;Other (comment)  body weight support    Activity Tolerance Patient limited by pain   Behavior During Therapy [facility] for tasks assessed/performed      Past Medical History  Diagnosis Date  . Migraine   . Depression   . Hypertension   . COPD (chronic obstructive pulmonary disease) ([place])   . Anxiety     h/o of panic attack  . GERD (gastroesophageal reflux disease)     occas. use of TUMS  . Arthritis     knees  . Stroke ([place]) 2016    weakness on left side.    Past Surgical History  Procedure Laterality Date  . [place] surgery  2010  . Cholecystectomy      age 48  . Appendectomy      age 70  . Eye surgery  years ago    laser to both eyes for blocked tear ducts   . Brain surgery  1993    aneurysm  . Tubal ligation  years ago  . Hemorroidectomy  years ago  . Radiology with anesthesia N/A 01/20/2015    Procedure: RADIOLOGY WITH ANESTHESIA;  Surgeon: Luanne Bras, MD;  Location: [facility];  Service: Radiology;  Laterality: N/A;  . Cerebral aneurysm repair      x3; last one 01-2015.  . Septoplasty    . Cataract extraction w/phaco Right 08/10/2015    Procedure: CATARACT EXTRACTION PHACO AND INTRAOCULAR LENS PLACEMENT (IOC);  Surgeon: Rutherford Guys, MD;  [facility];  Service: Ophthalmology;  Laterality: Right;  CDE:8.19  . Cataract extraction w/phaco Left 08/24/2015    Procedure: CATARACT EXTRACTION PHACO AND INTRAOCULAR LENS PLACEMENT (IOC);  Surgeon: Rutherford Guys, MD;  [facility];  Service: Ophthalmology;  Laterality: Left;  CDE:6.31    There were no vitals filed for this visit.  Visit Diagnosis:  Lack of coordination due to stroke  Muscle weakness  Ataxia  Unsteadiness  Left-sided weakness      Subjective Assessment - 02/02/16 1437    Subjective Patient reports she is "just doing alright" today; she is still having trouble with her L knee being painful and popping, they have not been able to get an MD to "do anything about it" per patient's husband.    Currently in Pain? Yes   Pain Score 1    Pain Location Knee   Pain Orientation Left                         [facility] Adult PT Treatment/Exercise - 02/03/16 0001    Knee/Hip Exercises: Stretches   Passive Hamstring Stretch Both;3 reps;30 seconds   Passive Hamstring Stretch Limitations lateral pressure on patella  Knee/Hip Exercises: Standing   Heel Raises Both;1 set;10 reps   Heel Raises Limitations body weight support    Other Standing Knee Exercises lateral weight shifts body weight support    Knee/Hip Exercises: Supine   Other Supine Knee/Hip Exercises SLRs with LE ER 2x10   Other Supine Knee/Hip Exercises bridges with ball 2x10   Knee/Hip Exercises: Sidelying   Clams R side lying 1x10                PT Education - 02/03/16 1037    Education provided No   Education Details PT sending knee request to MD, knees are what's limiting gait/balacne, VMO HEP    Person(s) Educated Patient;Spouse   Methods Explanation   Comprehension Verbalized understanding          PT Short Term Goals - 02/01/16 1614    PT SHORT TERM GOAL #1   Title Patient to be able to complete  TUG balance test in 20 seconds or less with LRAD and min guard in order to  demonstrate improved functional balance and reduced fall risk    Baseline Now supervision-min A depending on level of fatigue   Time 4   Period Weeks   Status On-going   PT SHORT TERM GOAL #2   Title Patient to be able to ambulate at least 134ft with LRAD and min guard, minimal fatigue in order to enhance functional activity tolerance and assist in safety with community mobility    Baseline Supervision   Time 4   Period Weeks   Status On-going   PT SHORT TERM GOAL #3   Title Patient to be mod(I) in all functional mobility and transfers with LRAD in order to reduce fall risk at home and improve general mobility    Time 4   Period Weeks   Status On-going   PT SHORT TERM GOAL #4   Title Patient will be able to independently, correctly, and consistently  verbally describe and physically adhere to safety precautions at home and in community in order to reduce fall and injury risk with mobitily    Baseline 9/12: completed in 29" with one UE support   Time 4   Period Weeks   Status On-going   PT SHORT TERM GOAL #5   Title Patient will be independent in correctly and consistently performing appropriate HEP, to be updated PRN    Time 4   Period Weeks   Status On-going           PT Long Term Goals - 02/01/16 1615    PT LONG TERM GOAL #1   Title Patient to demonstrate strength 4/5 in all tested muscle groups in order to assist in improving general stability and to reduce fall risk    Baseline Mod assist   Time 8   Period Weeks   Status On-going   PT LONG TERM GOAL #2   Title Patient to score at least a 45 on the BERG in order to demosntrate improved balance skills and to demonstrate reduced overall fall risk    Baseline 8 feet min assist   Time 8   Period Weeks   Status On-going   PT LONG TERM GOAL #3   Title Patient to be able to ambulate at least 236ft with LRAD and supervision, minimal fatigue and fall risk in order to assist in improving general safe mobility and to allow her  to get into doctors offices more easily    Time 8   Period  Weeks   Status On-going   PT LONG TERM GOAL #4   Title Patient will report no falls within the past 3 weeks in order to demonstrate improved safety and mobility overall    Time 8   Period Weeks   Status On-going   PT LONG TERM GOAL #5   Title Patient's husband will demonstrate independence in provinding safe guarding and assistance with mobility in order to enahnce overall mobilty and safety for both patient and her husband at home    Time 8   Period Weeks   Status On-going               Plan - 02/03/16 1038    Clinical Impression Statement Patient arrives continuing to complain of bilatera knee pain today; trialed body weight support system to attempt to offload knees and improve ability to participate in weight bearing activity but this was unsuccessful and patient had to stop due to pai in her knees. Focused otherwise on functional stretching for B LEs and exercise for VMO to assist in controlling position ofpatella. Educated patient and husband on appropriate exercises and also that knee PT request will be sent to MD. Recommend dropping to 2x/week for next week. Patient appears to be much more limited by her knee pain and resultant avoidance of activity and weakness than she is by residual effects of neuro condition.    Pt will benefit from skilled therapeutic intervention in order to improve on the following deficits Abnormal gait;Impaired tone;Decreased activity tolerance;Decreased strength;Decreased balance;Decreased mobility;Difficulty walking;Improper body mechanics;Decreased coordination;Postural dysfunction   Rehab Potential Good   PT Frequency Other (comment)   PT Duration 8 weeks   PT Treatment/Interventions ADLs/Self Care Home Management;Biofeedback;Electrical Stimulation;DME Instruction;Gait training;Stair training;Functional mobility training;Therapeutic activities;Therapeutic exercise;Balance  training;Neuromuscular re-education;Patient/family education;Manual techniques;Energy conservation;Taping   PT Next Visit Plan open chain strengthneing, limited CKC due to pain, follow up on knee referral        Problem List Patient Active Problem List   Diagnosis Date Noted  . Anxiety state 12/09/2015  . Cerebral infarction due to embolism of left carotid artery ([place]) 03/04/2015  . Aneurysm, cerebral, nonruptured 03/04/2015  . History of ruptured arterial aneurysm 03/04/2015  . Essential hypertension 03/04/2015  . HLD (hyperlipidemia) 03/04/2015  . Alterations of sensations following CVA (cerebrovascular accident)   . Embolic cerebral infarction ([place]) 01/22/2015  . Left hemiparesis ([place]) 01/22/2015  . Stroke ([place])   . Cerebral embolism with cerebral infarction ([place]) 01/21/2015  . Ataxia   . Brain aneurysm 01/20/2015  . TOBACCO ABUSE 01/04/2010  . BRONCHITIS, CHRONIC 01/04/2010  . CERVICAL CANCER 01/03/2010  . HYPERLIPIDEMIA 01/03/2010  . DEPRESSION 01/03/2010  . SUBARACHNOID HEMORRHAGE 01/03/2010  . [name] 01/03/2010   Deniece Ree PT, DPT [facility] [address] [place], [place], [address] Phone: [phone]   Fax:  [phone]  Name: GERA STATER MRN: [id number] Date of Birth: Apr 21, 1946

## 2016-02-07 ENCOUNTER — Ambulatory Visit (HOSPITAL_COMMUNITY): Payer: Medicare Other | Admitting: Physical Therapy

## 2016-02-07 DIAGNOSIS — IMO0002 Reserved for concepts with insufficient information to code with codable children: Secondary | ICD-10-CM

## 2016-02-07 DIAGNOSIS — G8194 Hemiplegia, unspecified affecting left nondominant side: Secondary | ICD-10-CM | POA: Diagnosis not present

## 2016-02-07 DIAGNOSIS — R29898 Other symptoms and signs involving the musculoskeletal system: Secondary | ICD-10-CM

## 2016-02-07 DIAGNOSIS — R279 Unspecified lack of coordination: Secondary | ICD-10-CM | POA: Diagnosis not present

## 2016-02-07 DIAGNOSIS — I6980 Unspecified sequelae of other cerebrovascular disease: Secondary | ICD-10-CM | POA: Diagnosis not present

## 2016-02-07 DIAGNOSIS — R270 Ataxia, unspecified: Secondary | ICD-10-CM

## 2016-02-07 DIAGNOSIS — R262 Difficulty in walking, not elsewhere classified: Secondary | ICD-10-CM | POA: Diagnosis not present

## 2016-02-07 DIAGNOSIS — R531 Weakness: Secondary | ICD-10-CM

## 2016-02-07 DIAGNOSIS — M6289 Other specified disorders of muscle: Secondary | ICD-10-CM | POA: Diagnosis not present

## 2016-02-07 DIAGNOSIS — R269 Unspecified abnormalities of gait and mobility: Secondary | ICD-10-CM | POA: Diagnosis not present

## 2016-02-07 DIAGNOSIS — M6281 Muscle weakness (generalized): Secondary | ICD-10-CM

## 2016-02-07 DIAGNOSIS — R2681 Unsteadiness on feet: Secondary | ICD-10-CM

## 2016-02-07 NOTE — Therapy (Signed)
[facility] [address] [place], [place], [address] Phone: [phone]   Fax:  [phone]  Physical Therapy Treatment  Patient Details  Name: Diane Hutchinson MRN: [id number] Date of Birth: [birth year] Referring Provider: Rosalin Hawking   Encounter Date: 02/07/2016      PT End of Session - 02/07/16 1504    Visit Number 5   Number of Visits 20   Date for PT Re-Evaluation 02/17/16   Authorization Type Medicare/[employer]    Authorization Time Period 01/20/16 to 03/21/16   Authorization - Visit Number 5   Authorization - Number of Visits 10   PT Start Time [id number]   PT Stop Time [id number]   PT Time Calculation (min) 50 min   Equipment Utilized During Treatment Gait belt;Other (comment)  body weight support    Activity Tolerance Patient limited by pain   Behavior During Therapy [facility] for tasks assessed/performed      Past Medical History  Diagnosis Date  . Migraine   . Depression   . Hypertension   . COPD (chronic obstructive pulmonary disease) ([place])   . Anxiety     h/o of panic attack  . GERD (gastroesophageal reflux disease)     occas. use of TUMS  . Arthritis     knees  . Stroke ([place]) 2016    weakness on left side.    Past Surgical History  Procedure Laterality Date  . [place] surgery  2010  . Cholecystectomy      age 70  . Appendectomy      age 70  . Eye surgery  years ago    laser to both eyes for blocked tear ducts   . Brain surgery  1993    aneurysm  . Tubal ligation  years ago  . Hemorroidectomy  years ago  . Radiology with anesthesia N/A 01/20/2015    Procedure: RADIOLOGY WITH ANESTHESIA;  Surgeon: Luanne Bras, MD;  Location: [facility];  Service: Radiology;  Laterality: N/A;  . Cerebral aneurysm repair      x3; last one 01-2015.  . Septoplasty    . Cataract extraction w/phaco Right 08/10/2015    Procedure: CATARACT EXTRACTION PHACO AND INTRAOCULAR LENS PLACEMENT (IOC);  Surgeon: Rutherford Guys, MD;  [facility];  Service: Ophthalmology;  Laterality: Right;  CDE:8.19  . Cataract extraction w/phaco Left 08/24/2015    Procedure: CATARACT EXTRACTION PHACO AND INTRAOCULAR LENS PLACEMENT (IOC);  Surgeon: Rutherford Guys, MD;  Location: [facility];  Service: Ophthalmology;  Laterality: Left;  CDE:6.31    There were no vitals filed for this visit.  Visit Diagnosis:  Lack of coordination due to stroke  Muscle weakness  Ataxia  Unsteadiness  Left-sided weakness  Decreased pinch strength  Hemiparesis, left (HCC)  Difficulty walking      Subjective Assessment - 02/07/16 1502    Subjective Pt states she is not hurting anywhere.  Husband reports she got into the shower the other day without letting him knw and also fell one day last week trying ot retrieve an item off the floor.     Currently in Pain? No/denies                         [facility] Adult PT Treatment/Exercise - 02/07/16 1449    Lumbar Exercises: Standing   Heel Raises --   Heel Raises Limitations --   Lumbar Exercises: Supine   Bridge 15 reps   Straight Leg  Raise 10 reps   Lumbar Exercises: Sidelying   Clam 10 reps   Hip Abduction 10 reps   Knee/Hip Exercises: Standing   Gait Training with hemiwalker 100'X1, 200'X1   Knee/Hip Exercises: Seated   Sit to Sand 10 reps                PT Education - 02/07/16 1504    Education provided Yes   Education Details general safety; getting assistance to get into and out of the shower.  Hemiwalker safety and proper usage.   Person(s) Educated Patient;Spouse   Methods Explanation;Demonstration;Tactile cues;Verbal cues   Comprehension Verbalized understanding;Returned demonstration;Verbal cues required;Tactile cues required;Need further instruction          PT Short Term Goals - 02/01/16 1614    PT SHORT TERM GOAL #1   Title Patient to be able to complete  TUG balance test in 20 seconds or less with LRAD and min guard in order to demonstrate improved functional  balance and reduced fall risk    Baseline Now supervision-min A depending on level of fatigue   Time 4   Period Weeks   Status On-going   PT SHORT TERM GOAL #2   Title Patient to be able to ambulate at least 110ft with LRAD and min guard, minimal fatigue in order to enhance functional activity tolerance and assist in safety with community mobility    Baseline Supervision   Time 4   Period Weeks   Status On-going   PT SHORT TERM GOAL #3   Title Patient to be mod(I) in all functional mobility and transfers with LRAD in order to reduce fall risk at home and improve general mobility    Time 4   Period Weeks   Status On-going   PT SHORT TERM GOAL #4   Title Patient will be able to independently, correctly, and consistently  verbally describe and physically adhere to safety precautions at home and in community in order to reduce fall and injury risk with mobitily    Baseline 9/12: completed in 29" with one UE support   Time 4   Period Weeks   Status On-going   PT SHORT TERM GOAL #5   Title Patient will be independent in correctly and consistently performing appropriate HEP, to be updated PRN    Time 4   Period Weeks   Status On-going           PT Long Term Goals - 02/01/16 1615    PT LONG TERM GOAL #1   Title Patient to demonstrate strength 4/5 in all tested muscle groups in order to assist in improving general stability and to reduce fall risk    Baseline Mod assist   Time 8   Period Weeks   Status On-going   PT LONG TERM GOAL #2   Title Patient to score at least a 45 on the BERG in order to demosntrate improved balance skills and to demonstrate reduced overall fall risk    Baseline 8 feet min assist   Time 8   Period Weeks   Status On-going   PT LONG TERM GOAL #3   Title Patient to be able to ambulate at least 268ft with LRAD and supervision, minimal fatigue and fall risk in order to assist in improving general safe mobility and to allow her to get into doctors offices more  easily    Time 8   Period Weeks   Status On-going   PT LONG TERM GOAL #4  Title Patient will report no falls within the past 3 weeks in order to demonstrate improved safety and mobility overall    Time 8   Period Weeks   Status On-going   PT LONG TERM GOAL #5   Title Patient's husband will demonstrate independence in provinding safe guarding and assistance with mobility in order to enahnce overall mobilty and safety for both patient and her husband at home    Time 8   Period Weeks   Status On-going               Plan - 02/07/16 1510    Clinical Impression Statement PT arrives with hemiwalker today.  Pt with tendency to turn walker sideways, running Rt foot into it making it a safety hazard. No pain reported during or after sesion today.  Knee referral not yest received.   Worked with patient on correct way to position HW and overall sequencing.  Improved at end of session but with noted fatigue.  PT requires tactile and verbal cues from therapist to complete all therex in correct form.  Overall good exercise performance today.  Pt will need safety cues and reminders each visit.    Pt will benefit from skilled therapeutic intervention in order to improve on the following deficits Abnormal gait;Impaired tone;Decreased activity tolerance;Decreased strength;Decreased balance;Decreased mobility;Difficulty walking;Improper body mechanics;Decreased coordination;Postural dysfunction   Rehab Potential Good   PT Frequency Other (comment)   PT Duration 8 weeks   PT Treatment/Interventions ADLs/Self Care Home Management;Biofeedback;Electrical Stimulation;DME Instruction;Gait training;Stair training;Functional mobility training;Therapeutic activities;Therapeutic exercise;Balance training;Neuromuscular re-education;Patient/family education;Manual techniques;Energy conservation;Taping   PT Next Visit Plan Progress to [facility] as ablet due to pain. Follow up on knee referral        Problem  List Patient Active Problem List   Diagnosis Date Noted  . Anxiety state 12/09/2015  . Cerebral infarction due to embolism of left carotid artery ([place]) 03/04/2015  . Aneurysm, cerebral, nonruptured 03/04/2015  . History of ruptured arterial aneurysm 03/04/2015  . Essential hypertension 03/04/2015  . HLD (hyperlipidemia) 03/04/2015  . Alterations of sensations following CVA (cerebrovascular accident)   . Embolic cerebral infarction ([place]) 01/22/2015  . Left hemiparesis ([place]) 01/22/2015  . Stroke ([place])   . Cerebral embolism with cerebral infarction ([place]) 01/21/2015  . Ataxia   . Brain aneurysm 01/20/2015  . TOBACCO ABUSE 01/04/2010  . BRONCHITIS, CHRONIC 01/04/2010  . CERVICAL CANCER 01/03/2010  . HYPERLIPIDEMIA 01/03/2010  . DEPRESSION 01/03/2010  . SUBARACHNOID HEMORRHAGE 01/03/2010  . [name] 01/03/2010    Teena Irani, PTA/CLT [phone] 02/07/2016, 3:20 PM  [facility] [address] [place], [place], [address] Phone: [phone]   Fax:  [phone]  Name: BRECKEN MICHALSKI MRN: [id number] Date of Birth: Jan 25, 1946

## 2016-02-09 ENCOUNTER — Telehealth: Payer: Self-pay

## 2016-02-09 ENCOUNTER — Ambulatory Visit (HOSPITAL_COMMUNITY): Payer: Medicare Other | Admitting: Physical Therapy

## 2016-02-09 DIAGNOSIS — R269 Unspecified abnormalities of gait and mobility: Secondary | ICD-10-CM | POA: Diagnosis not present

## 2016-02-09 DIAGNOSIS — R279 Unspecified lack of coordination: Secondary | ICD-10-CM | POA: Diagnosis not present

## 2016-02-09 DIAGNOSIS — R2681 Unsteadiness on feet: Secondary | ICD-10-CM

## 2016-02-09 DIAGNOSIS — R29898 Other symptoms and signs involving the musculoskeletal system: Secondary | ICD-10-CM

## 2016-02-09 DIAGNOSIS — R270 Ataxia, unspecified: Secondary | ICD-10-CM

## 2016-02-09 DIAGNOSIS — R531 Weakness: Secondary | ICD-10-CM

## 2016-02-09 DIAGNOSIS — R262 Difficulty in walking, not elsewhere classified: Secondary | ICD-10-CM | POA: Diagnosis not present

## 2016-02-09 DIAGNOSIS — M6281 Muscle weakness (generalized): Secondary | ICD-10-CM

## 2016-02-09 DIAGNOSIS — M6289 Other specified disorders of muscle: Secondary | ICD-10-CM | POA: Diagnosis not present

## 2016-02-09 DIAGNOSIS — IMO0002 Reserved for concepts with insufficient information to code with codable children: Secondary | ICD-10-CM

## 2016-02-09 DIAGNOSIS — G8194 Hemiplegia, unspecified affecting left nondominant side: Secondary | ICD-10-CM | POA: Diagnosis not present

## 2016-02-09 DIAGNOSIS — I6980 Unspecified sequelae of other cerebrovascular disease: Secondary | ICD-10-CM | POA: Diagnosis not present

## 2016-02-09 NOTE — Therapy (Signed)
[facility] [place], [place], [address] Phone: [phone]   Fax:  [phone]  Physical Therapy Treatment  Patient Details  Name: Diane Hutchinson MRN: [id number] Date of Birth: 10-16-1946 Referring Provider: Rosalin Hawking   Encounter Date: 02/09/2016      PT End of Session - 02/09/16 1900    Visit Number 6   Number of Visits 20   Date for PT Re-Evaluation 02/17/16   Authorization Type Medicare/[employer]    Authorization Time Period 01/20/16 to 03/21/16   Authorization - Visit Number 6   Authorization - Number of Visits 10   PT Start Time [id number]   PT Stop Time [id number]   PT Time Calculation (min) 44 min   Equipment Utilized During Treatment Gait belt;Other (comment)  body weight support    Activity Tolerance Patient limited by pain   Behavior During Therapy [facility] for tasks assessed/performed      Past Medical History  Diagnosis Date  . Migraine   . Depression   . Hypertension   . COPD (chronic obstructive pulmonary disease) ([place])   . Anxiety     h/o of panic attack  . GERD (gastroesophageal reflux disease)     occas. use of TUMS  . Arthritis     knees  . Stroke ([place]) 2016    weakness on left side.    Past Surgical History  Procedure Laterality Date  . [place] surgery  2010  . Cholecystectomy      age 46  . Appendectomy      age 55  . Eye surgery  years ago    laser to both eyes for blocked tear ducts   . Brain surgery  1993    aneurysm  . Tubal ligation  years ago  . Hemorroidectomy  years ago  . Radiology with anesthesia N/A 01/20/2015    Procedure: RADIOLOGY WITH ANESTHESIA;  Surgeon: Luanne Bras, MD;  Location: [facility];  Service: Radiology;  Laterality: N/A;  . Cerebral aneurysm repair      x3; last one 01-2015.  . Septoplasty    . Cataract extraction w/phaco Right 08/10/2015    Procedure: CATARACT EXTRACTION PHACO AND INTRAOCULAR LENS PLACEMENT (IOC);  Surgeon: Rutherford Guys, MD;  [facility];  Service: Ophthalmology;  Laterality: Right;  CDE:8.19  . Cataract extraction w/phaco Left 08/24/2015    Procedure: CATARACT EXTRACTION PHACO AND INTRAOCULAR LENS PLACEMENT (IOC);  Surgeon: Rutherford Guys, MD;  [facility]: [facility];  Service: Ophthalmology;  Laterality: Left;  CDE:6.31    There were no vitals filed for this visit.  Visit Diagnosis:  Lack of coordination due to stroke  Muscle weakness  Ataxia  Unsteadiness  Left-sided weakness  Decreased pinch strength  Hemiparesis, left (HCC)  Difficulty walking      Subjective Assessment - 02/09/16 1338    Subjective Pt states she is doing well today.  States she walked through her house Tuesday without her AD and did not loose her balance.  Pt reports no pain currently.   Currently in Pain? No/denies                         [facility] Adult PT Treatment/Exercise - 02/09/16 1339    Lumbar Exercises: Standing   Heel Raises 10 reps   Heel Raises Limitations toe and heel, B UEs; difficulty with form toe raises    Forward Lunge 20 reps   Forward Lunge Limitations 2  sets 10 reps Lt LE leading on 4" step with therapist facilitation to keep form and LE in neutral   Lumbar Exercises: Seated   Sit to Stand 10 reps   Sit to Stand Limitations no UE's   Knee/Hip Exercises: Standing   Forward Step Up Both;5 reps;2 sets;Hand Hold: 1;Step Height: 4"   Gait Training with hemiwalker 200'X1, without AD 100'X1                  PT Short Term Goals - 02/01/16 1614    PT SHORT TERM GOAL #1   Title Patient to be able to complete  TUG balance test in 20 seconds or less with LRAD and min guard in order to demonstrate improved functional balance and reduced fall risk    Baseline Now supervision-min A depending on level of fatigue   Time 4   Period Weeks   Status On-going   PT SHORT TERM GOAL #2   Title Patient to be able to ambulate at least 169ft with LRAD and min guard, minimal fatigue in order to enhance functional  activity tolerance and assist in safety with community mobility    Baseline Supervision   Time 4   Period Weeks   Status On-going   PT SHORT TERM GOAL #3   Title Patient to be mod(I) in all functional mobility and transfers with LRAD in order to reduce fall risk at home and improve general mobility    Time 4   Period Weeks   Status On-going   PT SHORT TERM GOAL #4   Title Patient will be able to independently, correctly, and consistently  verbally describe and physically adhere to safety precautions at home and in community in order to reduce fall and injury risk with mobitily    Baseline 9/12: completed in 29" with one UE support   Time 4   Period Weeks   Status On-going   PT SHORT TERM GOAL #5   Title Patient will be independent in correctly and consistently performing appropriate HEP, to be updated PRN    Time 4   Period Weeks   Status On-going           PT Long Term Goals - 02/01/16 1615    PT LONG TERM GOAL #1   Title Patient to demonstrate strength 4/5 in all tested muscle groups in order to assist in improving general stability and to reduce fall risk    Baseline Mod assist   Time 8   Period Weeks   Status On-going   PT LONG TERM GOAL #2   Title Patient to score at least a 45 on the BERG in order to demosntrate improved balance skills and to demonstrate reduced overall fall risk    Baseline 8 feet min assist   Time 8   Period Weeks   Status On-going   PT LONG TERM GOAL #3   Title Patient to be able to ambulate at least 256ft with LRAD and supervision, minimal fatigue and fall risk in order to assist in improving general safe mobility and to allow her to get into doctors offices more easily    Time 8   Period Weeks   Status On-going   PT LONG TERM GOAL #4   Title Patient will report no falls within the past 3 weeks in order to demonstrate improved safety and mobility overall    Time 8   Period Weeks   Status On-going   PT LONG TERM GOAL #5  Title Patient's  husband will demonstrate independence in provinding safe guarding and assistance with mobility in order to enahnce overall mobilty and safety for both patient and her husband at home    Time 8   Period Weeks   Status On-going               Plan - 02/09/16 1900    Clinical Impression Statement PT eager to begin ambulation without AD.  Pt ambulated X57feet without AD, however unstable at times wtih inability to self correct LOB and scissoring gait.  Highly recommended patient use HW at all times until balance and strength improve.  PRogressed to standing exercises today including lunges, squats and step ups.  Grinding increased with lateral step ups in bilateral knees and painful.  Able to complete forward step ups without difficulty.  Pt unable to complete lunges with Rt LE lead due to instability and weakness of Lt LE.  Improved ability of sitt ot stands without UE assist.  Pt with noted increased fatigue at end of session.    Pt will benefit from skilled therapeutic intervention in order to improve on the following deficits Abnormal gait;Impaired tone;Decreased activity tolerance;Decreased strength;Decreased balance;Decreased mobility;Difficulty walking;Improper body mechanics;Decreased coordination;Postural dysfunction   Rehab Potential Good   PT Frequency Other (comment)   PT Duration 8 weeks   PT Treatment/Interventions ADLs/Self Care Home Management;Biofeedback;Electrical Stimulation;DME Instruction;Gait training;Stair training;Functional mobility training;Therapeutic activities;Therapeutic exercise;Balance training;Neuromuscular re-education;Patient/family education;Manual techniques;Energy conservation;Taping   PT Next Visit Plan Progress to [facility] as ablet due to pain. Follow up on knee referral        Problem List Patient Active Problem List   Diagnosis Date Noted  . Anxiety state 12/09/2015  . Cerebral infarction due to embolism of left carotid artery ([employer]) 03/04/2015  .  Aneurysm, cerebral, nonruptured 03/04/2015  . History of ruptured arterial aneurysm 03/04/2015  . Essential hypertension 03/04/2015  . HLD (hyperlipidemia) 03/04/2015  . Alterations of sensations following CVA (cerebrovascular accident)   . Embolic cerebral infarction ([place]) 01/22/2015  . Left hemiparesis ([place]) 01/22/2015  . Stroke ([place])   . Cerebral embolism with cerebral infarction ([place]) 01/21/2015  . Ataxia   . Brain aneurysm 01/20/2015  . TOBACCO ABUSE 01/04/2010  . BRONCHITIS, CHRONIC 01/04/2010  . CERVICAL CANCER 01/03/2010  . HYPERLIPIDEMIA 01/03/2010  . DEPRESSION 01/03/2010  . SUBARACHNOID HEMORRHAGE 01/03/2010  . [name] 01/03/2010    Teena Irani, PTA/CLT [phone]  02/09/2016, 7:05 PM  [facility] [address] [place], [place], [address] Phone: [phone]   Fax:  [phone]  Name: Diane Hutchinson MRN: [id number] Date of Birth: 1945-12-05

## 2016-02-09 NOTE — Telephone Encounter (Signed)
Rn call [facility] about a referral form for therapy. Rn stated the therapist sign where the MD box is. Another form will be fax for correction.

## 2016-02-10 ENCOUNTER — Encounter (HOSPITAL_COMMUNITY): Payer: Medicare Other | Admitting: Physical Therapy

## 2016-02-14 ENCOUNTER — Encounter (HOSPITAL_COMMUNITY): Payer: Medicare Other | Admitting: Physical Therapy

## 2016-02-14 ENCOUNTER — Telehealth (HOSPITAL_COMMUNITY): Payer: Self-pay | Admitting: Physical Therapy

## 2016-02-14 DIAGNOSIS — L2089 Other atopic dermatitis: Secondary | ICD-10-CM | POA: Diagnosis not present

## 2016-02-14 NOTE — Telephone Encounter (Signed)
Pt has been sick all weekend, husband will take her to MD today and let us know what is going on.

## 2016-02-16 ENCOUNTER — Ambulatory Visit (HOSPITAL_COMMUNITY): Payer: Medicare Other | Attending: Neurology

## 2016-02-16 DIAGNOSIS — R279 Unspecified lack of coordination: Secondary | ICD-10-CM | POA: Insufficient documentation

## 2016-02-16 DIAGNOSIS — R270 Ataxia, unspecified: Secondary | ICD-10-CM

## 2016-02-16 DIAGNOSIS — R2681 Unsteadiness on feet: Secondary | ICD-10-CM | POA: Diagnosis not present

## 2016-02-16 DIAGNOSIS — R531 Weakness: Secondary | ICD-10-CM

## 2016-02-16 DIAGNOSIS — M6281 Muscle weakness (generalized): Secondary | ICD-10-CM | POA: Diagnosis not present

## 2016-02-16 DIAGNOSIS — I6980 Unspecified sequelae of other cerebrovascular disease: Secondary | ICD-10-CM | POA: Insufficient documentation

## 2016-02-16 DIAGNOSIS — M6289 Other specified disorders of muscle: Secondary | ICD-10-CM | POA: Diagnosis not present

## 2016-02-16 DIAGNOSIS — IMO0002 Reserved for concepts with insufficient information to code with codable children: Secondary | ICD-10-CM

## 2016-02-16 NOTE — Therapy (Signed)
[facility] [place], [place], [address] Phone: [phone]   Fax:  [phone]  Physical Therapy Treatment  Patient Details  Name: Diane Hutchinson MRN: [id number] Date of Birth: 13-Sep-1946 Referring Provider: Rosalin Hawking   Encounter Date: 02/16/2016      PT End of Session - 02/16/16 1442    Visit Number 7   Number of Visits 20   Date for PT Re-Evaluation 02/17/16   Authorization Type Medicare/[employer]    Authorization Time Period 01/20/16 to 03/21/16   Authorization - Visit Number 7   Authorization - Number of Visits 10   PT Start Time [id number]   PT Stop Time 1518   PT Time Calculation (min) 45 min   Equipment Utilized During Treatment Gait belt  hemiwalker   Activity Tolerance Patient limited by fatigue;Patient tolerated treatment well;Patient limited by pain  knee pain with lunges and steps   Behavior During Therapy [facility] for tasks assessed/performed      Past Medical History  Diagnosis Date  . Migraine   . Depression   . Hypertension   . COPD (chronic obstructive pulmonary disease) ([place])   . Anxiety     h/o of panic attack  . GERD (gastroesophageal reflux disease)     occas. use of TUMS  . Arthritis     knees  . Stroke ([place]) 2016    weakness on left side.    Past Surgical History  Procedure Laterality Date  . [place] surgery  2010  . Cholecystectomy      age 57  . Appendectomy      age 79  . Eye surgery  years ago    laser to both eyes for blocked tear ducts   . Brain surgery  1993    aneurysm  . Tubal ligation  years ago  . Hemorroidectomy  years ago  . Radiology with anesthesia N/A 01/20/2015    Procedure: RADIOLOGY WITH ANESTHESIA;  Surgeon: Luanne Bras, MD;  Location: [facility];  Service: Radiology;  Laterality: N/A;  . Cerebral aneurysm repair      x3; last one 01-2015.  . Septoplasty    . Cataract extraction w/phaco Right 08/10/2015    Procedure: CATARACT EXTRACTION PHACO AND INTRAOCULAR  LENS PLACEMENT (IOC);  Surgeon: Rutherford Guys, MD;  Location: [facility];  Service: Ophthalmology;  Laterality: Right;  CDE:8.19  . Cataract extraction w/phaco Left 08/24/2015    Procedure: CATARACT EXTRACTION PHACO AND INTRAOCULAR LENS PLACEMENT (IOC);  Surgeon: Rutherford Guys, MD;  Location: [facility];  Service: Ophthalmology;  Laterality: Left;  CDE:6.31    There were no vitals filed for this visit.  Visit Diagnosis:  Lack of coordination due to stroke  Muscle weakness  Ataxia  Unsteadiness  Left-sided weakness      Subjective Assessment - 02/16/16 1419    Subjective Pt stated she is doing well topday, no reports of pain today.  She reports walking around her house without her AD.  No reports of recent falls.   Pertinent History aneurysm, stroke, HTN, COPD, subdural hemmorhage, L hemiparesis, hisotry of brain surgery with stent in place    Patient Stated Goals wants to get hand and get  legs working    Currently in Pain? No/denies          [facility] Adult PT Treatment/Exercise - 02/16/16 0001    Lumbar Exercises: Standing   Heel Raises 10 reps   Heel Raises Limitations toe and heel, B UEs; difficulty  with form toe raises    Forward Lunge 20 reps   Forward Lunge Limitations 2 sets 10 reps Lt LE leading on 4" step with therapist facilitation to keep form and LE in neutral   Knee/Hip Exercises: Standing   Hip Abduction Both;2 sets;10 reps   Forward Step Up Both;5 reps;2 sets;Hand Hold: 1;Step Height: 4"   Gait Training with hemiwalker 232feet cueing for sequence   Knee/Hip Exercises: Seated   Sit to Sand 10 reps             PT Short Term Goals - 02/01/16 1614    PT SHORT TERM GOAL #1   Title Patient to be able to complete  TUG balance test in 20 seconds or less with LRAD and min guard in order to demonstrate improved functional balance and reduced fall risk    Baseline Now supervision-min A depending on level of fatigue   Time 4   Period Weeks   Status On-going   PT SHORT  TERM GOAL #2   Title Patient to be able to ambulate at least 151ft with LRAD and min guard, minimal fatigue in order to enhance functional activity tolerance and assist in safety with community mobility    Baseline Supervision   Time 4   Period Weeks   Status On-going   PT SHORT TERM GOAL #3   Title Patient to be mod(I) in all functional mobility and transfers with LRAD in order to reduce fall risk at home and improve general mobility    Time 4   Period Weeks   Status On-going   PT SHORT TERM GOAL #4   Title Patient will be able to independently, correctly, and consistently  verbally describe and physically adhere to safety precautions at home and in community in order to reduce fall and injury risk with mobitily    Baseline 9/12: completed in 29" with one UE support   Time 4   Period Weeks   Status On-going   PT SHORT TERM GOAL #5   Title Patient will be independent in correctly and consistently performing appropriate HEP, to be updated PRN    Time 4   Period Weeks   Status On-going           PT Long Term Goals - 02/01/16 1615    PT LONG TERM GOAL #1   Title Patient to demonstrate strength 4/5 in all tested muscle groups in order to assist in improving general stability and to reduce fall risk    Baseline Mod assist   Time 8   Period Weeks   Status On-going   PT LONG TERM GOAL #2   Title Patient to score at least a 45 on the BERG in order to demosntrate improved balance skills and to demonstrate reduced overall fall risk    Baseline 8 feet min assist   Time 8   Period Weeks   Status On-going   PT LONG TERM GOAL #3   Title Patient to be able to ambulate at least 2110ft with LRAD and supervision, minimal fatigue and fall risk in order to assist in improving general safe mobility and to allow her to get into doctors offices more easily    Time 8   Period Weeks   Status On-going   PT LONG TERM GOAL #4   Title Patient will report no falls within the past 3 weeks in order to  demonstrate improved safety and mobility overall    Time 8   Period Weeks  Status On-going   PT LONG TERM GOAL #5   Title Patient's husband will demonstrate independence in provinding safe guarding and assistance with mobility in order to enahnce overall mobilty and safety for both patient and her husband at home    Time 8   Period Weeks   Status On-going               Plan - 02/16/16 1623    Clinical Impression Statement Session focus on funcitonal strengthening and education for safety.  Pt continues to ambulate with instability required min assistance for LOB episodes and scissoring gait mechanics with AD.  Pt with tendency to place hemiwakler too far ahead with 2 prongs still in air while progressing next step requiring assistance and moderate cueing for proper sequence with gait.  Pt continues to c/o knee grinding wtih strenghtening exercises, had to reduce reps with forward lunges with rest breaks required for recovery time due to knee instability and weakness.  No reports of increased pain through session, was limited by fatigue.  Pt will continue to benefit from PT to improve gait mechanics, safety awareness, functional strengthening and balance training per PT POC.     Pt will benefit from skilled therapeutic intervention in order to improve on the following deficits Abnormal gait;Impaired tone;Decreased activity tolerance;Decreased strength;Decreased balance;Decreased mobility;Difficulty walking;Improper body mechanics;Decreased coordination;Postural dysfunction   Rehab Potential Good   PT Frequency Other (comment)   PT Duration 8 weeks   PT Treatment/Interventions ADLs/Self Care Home Management;Biofeedback;Electrical Stimulation;DME Instruction;Gait training;Stair training;Functional mobility training;Therapeutic activities;Therapeutic exercise;Balance training;Neuromuscular re-education;Patient/family education;Manual techniques;Energy conservation;Taping   PT Next Visit Plan  Progress to [facility] as able due to pain. Follow up on knee referral        Problem List Patient Active Problem List   Diagnosis Date Noted  . Anxiety state 12/09/2015  . Cerebral infarction due to embolism of left carotid artery ([place]) 03/04/2015  . Aneurysm, cerebral, nonruptured 03/04/2015  . History of ruptured arterial aneurysm 03/04/2015  . Essential hypertension 03/04/2015  . HLD (hyperlipidemia) 03/04/2015  . Alterations of sensations following CVA (cerebrovascular accident)   . Embolic cerebral infarction ([place]) 01/22/2015  . Left hemiparesis ([place]) 01/22/2015  . Stroke ([place])   . Cerebral embolism with cerebral infarction ([place]) 01/21/2015  . Ataxia   . Brain aneurysm 01/20/2015  . TOBACCO ABUSE 01/04/2010  . BRONCHITIS, CHRONIC 01/04/2010  . CERVICAL CANCER 01/03/2010  . HYPERLIPIDEMIA 01/03/2010  . DEPRESSION 01/03/2010  . SUBARACHNOID HEMORRHAGE 01/03/2010  . [name] 01/03/2010   Ihor Austin, LPTA; CBIS [phone]  Aldona Lento 02/16/2016, 4:34 PM  [facility] [address] [place], [place], [address] Phone: [phone]   Fax:  [phone]  Name: JACQUIE BANDO MRN: [id number] Date of Birth: Jul 04, 1946

## 2016-02-18 ENCOUNTER — Encounter (HOSPITAL_COMMUNITY): Payer: Medicare Other

## 2016-02-21 ENCOUNTER — Encounter (HOSPITAL_COMMUNITY): Payer: Medicare Other | Admitting: Physical Therapy

## 2016-02-21 ENCOUNTER — Telehealth (HOSPITAL_COMMUNITY): Payer: Self-pay | Admitting: Physical Therapy

## 2016-02-21 NOTE — Telephone Encounter (Signed)
Pt fell and hurt her knee can not come in today per husband. NF

## 2016-02-24 ENCOUNTER — Encounter (HOSPITAL_COMMUNITY): Payer: Medicare Other | Admitting: Physical Therapy

## 2016-02-28 ENCOUNTER — Encounter (HOSPITAL_COMMUNITY): Payer: Medicare Other | Admitting: Physical Therapy

## 2016-03-02 ENCOUNTER — Telehealth (HOSPITAL_COMMUNITY): Payer: Self-pay | Admitting: Physical Therapy

## 2016-03-02 ENCOUNTER — Ambulatory Visit (HOSPITAL_COMMUNITY): Payer: Medicare Other | Admitting: Physical Therapy

## 2016-03-02 NOTE — Telephone Encounter (Signed)
Husband is still not feeling well and can not bring her today. NF

## 2016-03-06 ENCOUNTER — Telehealth (HOSPITAL_COMMUNITY): Payer: Self-pay | Admitting: Physical Therapy

## 2016-03-06 ENCOUNTER — Ambulatory Visit (HOSPITAL_COMMUNITY): Payer: Medicare Other | Admitting: Physical Therapy

## 2016-03-06 NOTE — Telephone Encounter (Signed)
Husband called and said it was too muddy to get her out in this rain. NF

## 2016-03-09 ENCOUNTER — Telehealth (HOSPITAL_COMMUNITY): Payer: Self-pay

## 2016-03-09 ENCOUNTER — Telehealth (HOSPITAL_COMMUNITY): Payer: Self-pay | Admitting: Physical Therapy

## 2016-03-09 ENCOUNTER — Ambulatory Visit (HOSPITAL_COMMUNITY): Payer: Medicare Other | Admitting: Physical Therapy

## 2016-03-09 NOTE — Telephone Encounter (Signed)
03/09/16 cx - vomiting and diarrhea

## 2016-03-09 NOTE — Telephone Encounter (Signed)
Attempted to call patient x2 regarding possibly putting her on hold or DC-ing due to frequent cancellations over the course of the past few weeks; line busy on the first attempt and no answer on second attempt, did not leave message as answering machine did not appear to be working correctly.  Deniece Ree PT, DPT [phone]

## 2016-03-13 ENCOUNTER — Ambulatory Visit (INDEPENDENT_AMBULATORY_CARE_PROVIDER_SITE_OTHER): Payer: Medicare Other | Admitting: Neurology

## 2016-03-13 ENCOUNTER — Encounter: Payer: Self-pay | Admitting: Neurology

## 2016-03-13 ENCOUNTER — Encounter (HOSPITAL_COMMUNITY): Payer: Medicare Other | Admitting: Physical Therapy

## 2016-03-13 VITALS — BP 108/67 | HR 50 | Ht 67.0 in | Wt 156.6 lb

## 2016-03-13 DIAGNOSIS — R1030 Lower abdominal pain, unspecified: Secondary | ICD-10-CM | POA: Diagnosis not present

## 2016-03-13 DIAGNOSIS — I10 Essential (primary) hypertension: Secondary | ICD-10-CM | POA: Diagnosis not present

## 2016-03-13 DIAGNOSIS — F411 Generalized anxiety disorder: Secondary | ICD-10-CM | POA: Diagnosis not present

## 2016-03-13 DIAGNOSIS — E785 Hyperlipidemia, unspecified: Secondary | ICD-10-CM | POA: Diagnosis not present

## 2016-03-13 DIAGNOSIS — I671 Cerebral aneurysm, nonruptured: Secondary | ICD-10-CM

## 2016-03-13 DIAGNOSIS — I63132 Cerebral infarction due to embolism of left carotid artery: Secondary | ICD-10-CM | POA: Diagnosis not present

## 2016-03-13 NOTE — Patient Instructions (Addendum)
-   continue ASA 325 mg and plavix 37.5mg  and lipitor for stroke prevention and for stent management - follow up with Dr. Estanislado Pandy. - check BP at home - Follow up with your primary care physician for stroke risk factor modification. Recommend maintain blood pressure goal <130/80, diabetes with hemoglobin A1c goal below 6.5% and lipids with LDL cholesterol goal below 70 mg/dL.  - quit smoking - continue outp PT/OT - recommend colonoscopy to rule out lesions in lower GI. Your last colonoscopy was many years ago and likely due for regular colonoscopy.  Please discuss with PCP Dr. Sherrie Sport - follow up in 4 months.

## 2016-03-13 NOTE — Progress Notes (Signed)
STROKE NEUROLOGY FOLLOW UP NOTE  NAME: Diane Hutchinson DOB: [birth year]  REASON FOR VISIT: stroke follow up HISTORY FROM: husband and chart  Today we had the pleasure of seeing Diane Hutchinson in follow-up at our Neurology Clinic. Pt was accompanied by husband.   History Summary Diane Hutchinson is a 70 y.o. 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 Jaynee Eagles on 11/24/14 for clearance for right breast elective surgery. As per chart, she was admitted to [facility] on 11/08/2009 with severe headache, nausea and vomiting and CT scan showed SAH. Cerebral angiogram revealed a right PICA aneurysm, left MCA aneurysm and right MCA aneurysm s/p clipping in 1993. The right PICA aneurysm was coiled by Dr. Patrecia Pour. However, the left MCA aneurysm still left without treated. Due to higher risk of rupture during general anesthesia, Dr. Jaynee Eagles performed CTA head and referred pt to Dr. Estanislado Pandy for management of left MCA aneurysm.  On 01/22/15, pt had first step of stent assisted left widenecked MCA aneurysm procedure. After the procedure, pt developed left UE and LE numbness, tingling, weakness and ataxia. Pt not a tPA candidate secondary to being on IV heparin drip and elevated APTT. She can not have MRI. Repeat CT confirmed right thalamus/posterior IC, considering related to procedure. She was continued on ASA 325 and plavix 75 as well as lipitor 40. She was also recommended to quit smoking. She was sent to CIR after discharge.  She has hx of HLD and on lipitor and lopid since 1993. Hx of HTN and taking metoprolol.  Follow up 03/04/15 - the patient has been doing relatively stable. She finished CIR and discharged home with home therapy 3 times a week. She has quit smoking since March admission. She still has left sided weakness but  slow improving. She will have the 2nd stage of left MCA aneurysm treatment in about 2 months with Dr. Estanislado Pandy. After that, she will decide on the elective right breast procedure (not cancer as per pt). Her BP 149/71 but she said at home the therapist checked on her, it was always at 120s. Her platelet function assay was quite low on last check, therefore her plavix was decreased to half tablets.  Follow up 06/08/15 - the patient has been doing well. Left-sided weakness much more improved than last visit. She came in with wheelchair, however at home she was able to work with therapist to walk with walker. She has been following up with Dr. Letta Pate for rehabilitation. Blood pressure much improved, today in clinic 113/61. She still on aspirin 325 mg and half tablet of Plavix.  Follow up 12/09/15 - pt initially quit smoking but now resumed smoking. Husband stated that she could be one day very good, walking well, but then the other day, she has left side flaccid, not able to walk and can drop to the ground. She sometimes complain of left knee pain. She does have anxiety and depression, following with psychiatrist and on Xanax, Risperdal, Zoloft and trazodone. She has appointment with Dr. Estanislado Pandy tomorrow. BP at home stable, 120-130. Today in clinic 111/57.  Interval History During the interval time, pt has been doing the same. Resumed smoking and not willing to quit at this time. Still has left UE weakness and finger difficulty. Able to walk with hermiwalker now. However, pt has complains of feeling "  something in my bowel". She felt intermittently "wide belt", "tight knots", "beads-like objects" in her gut, moving along from side to side. Husband stated that she has asked him to take them out from her but husband can not see anything. Pt has intermittent diarrhea and constipation, no abnormal stool or hemorrhoids or rectal prolapse. Husband did not see anything abnormal from rectum. They stated that this  feeling aggravated pt very much and they are frustrated. She had CT abd/pelvis and did not show findings to explain about her feeling. Her PCP considered may related to her stroke as per pt. She had colonoscopy many years ago and it was normal. She is on Xanax for anxiety.  REVIEW OF SYSTEMS: Full 14 system review of systems performed and notable only for those listed below and in HPI above, all others are negative:  Constitutional:   Cardiovascular: leg swelling Ear/Nose/Throat: hearing loss, runny nose Skin:  Eyes:   Respiratory:   Gastroitestinal:   Genitourinary:  Hematology/Lymphatic:   Endocrine:  Musculoskeletal:   Allergy/Immunology:   Neurological:  weaness Psychiatric:  Sleep:   The following represents the patient's updated allergies and side effects list: Allergies  Allergen Reactions  . Codeine Other (See Comments)    dellusions  . Meperidine Hcl Other (See Comments)    Hurts stomach  . Morphine Other (See Comments)    dellusions  . Sulfonamide Derivatives Other (See Comments)    Drives her nuts    The neurologically relevant items on the patient's problem list were reviewed on today's visit.  Neurologic Examination  A problem focused neurological exam (12 or more points of the single system neurologic examination, vital signs counts as 1 point, cranial nerves count for 8 points) was performed.  Blood pressure 108/67, pulse 50, height 5\' 7"  (1.702 m), weight 156 lb 9.6 oz (71.033 kg).  General - Well nourished, well developed, in no apparent distress.  Ophthalmologic - fundi not visualized due to eye movement.  Cardiovascular - Regular rate and rhythm with no murmur.  Mental Status -  Level of arousal and orientation to time, place, and person were intact. Language including expression, naming, repetition, comprehension was assessed and found intact. Fund of Knowledge was assessed and was intact.  Cranial Nerves II - XII - II - Visual field intact  OU. III, IV, VI - Extraocular movements intact. V - Facial sensation symmetrical bilaterally VII - Facial movement intact bilaterally. VIII - Hearing & vestibular intact bilaterally. X - Palate elevates symmetrically. XI - Chin turning & shoulder shrug intact bilaterally. XII - Tongue protrusion intact.  Motor Strength - The patient's strength was 4+/5 LUE proximal and distal with left hand drift and finger dexterity difficulty, 5-/5 LLE proximal and distal.  Bulk was normal and fasciculations were absent.   Motor Tone - Muscle tone was assessed at the neck and appendages and was normal.  Reflexes - The patient's reflexes were normal in all extremities and she had no pathological reflexes.  Sensory - Light touch, temperature/pinprick were assessed and were subjectively decreased on the left UE and LE.    Coordination - No ataxia but slow on test of left FTN.  Tremor was absent.  Gait and Station - difficulty getting out of chair without assistance, able to walk with hemi-walker, but significant left hemiparetic gait.  Data reviewed: I personally reviewed the images and agree with the radiology interpretations.  CTA head 12/03/14 - 1. Prior right MCA bifurcation aneurysm clipping and prior right PICA aneurysm coiling  without evidence of residual/recurrent aneurysm. 2. Unchanged 4.5 mm left MCA bifurcation aneurysm. 3. No acute intracranial abnormality.  CTA head and neck 01/20/15 -  Pipeline stent placed in the left MCA spanning a left MCA aneurysm. No complications seen relative to that. No missing vessels. No hemorrhage. One could question mild spasm of the MCA branch just beyond the end of the stent, but the vessel is widely patent beyond that and appears as it did before. Previously clipped right MCA region aneurysm. No missing vessels demonstrated on the right compared to the study of 12/03/2014. Previously coiled right vertebral aneurysm without evidence of recannulized  flow.  CT head 01/22/15 -  Changes consistent with prior aneurysm coiling, clipping and stenting as described. New rounded area of decreased attenuation in the right thalamus laterally consistent with an evolving area of ischemia.  2D echo - Normal LV size and systolic function, EF [id number]. Normal RV size and systolic function. No significant valvular abnormalities. Mild LAE.  CT abd/pelvis 12/03/15 -  1. No acute abnormality seen to explain the patient's symptoms. No evidence of traumatic injury to the abdomen or pelvis.  2. Chronic compression deformity involving the superior endplate of th L2 appears mildly worsened from 2016. The L1 vertebral body is unremarkable in appearance; previously suspected compression deformity at L1 simply reflects artifact form positioning.  3. Moderate left-sided renal atrophy noted. 4. Scattered calcification along the abdominal aorta and its branches, with likely moderate to severe luminal narrowing along the proximal right common iliac artery 5. Scattered diverticulosis along with sigmoid colon, without evidence of diverticulitis.  Component     Latest Ref Rng 01/21/2015 01/22/2015  Cholesterol     0 - 200 mg/dL  116  Triglycerides     <150 mg/dL  146  HDL Cholesterol     >39 mg/dL  34 (L)  Total CHOL/HDL Ratio       3.4  VLDL     0 - 40 mg/dL  29  LDL (calc)     0 - 99 mg/dL  53  Hemoglobin A1C     4.8 - 5.6 % 6.0 (H)   Mean Plasma Glucose      126     Assessment: As you may recall, she is a 70 y.o. Caucasian female with PMH of HTN, HLD, right MCA aneurysm s/p clippering in 1993, [place] with right PICA ruptured aneurysm s/p coiling on 11/08/2009 was admitted for left MCA aneurysm management with pipe-line stent on 01/22/15, found to have right thalamic/posterior IC stroke post procedure. Pt stroke risk factor including smoker, hx of HTN and HLD, but LDL controlled well on meds. Her stroke still most likely due to procedure, especially posterior  IC stroke may due to embolic source from AchA coming out directly from ICA. She was continued on ASA 325mg  and with half tab plavix (due to low P2Y12) as well as lipitor. Follow up with Dr. Estanislado Pandy for management of left MCA aneurysm. Over time, her left-sided weakness and numbness much improved, currently able to walk with semi-walker. She initially quit smoking but now resumed. Recently, she started to have abnormal feeling of her GI tract, CT abd/pelvis not revealing. Will need to consider colonoscopy  Plan:   - continue ASA 325 mg and plavix 37.5mg  and lipitor for stroke prevention and for stent management - follow up with Dr. Estanislado Pandy. - check BP at home - Follow up with your primary care physician for stroke risk factor modification. Recommend maintain blood pressure goal <  130/80, diabetes with hemoglobin A1c goal below 6.5% and lipids with LDL cholesterol goal below 70 mg/dL.  - quit smoking - continue outp PT/OT - recommend colonoscopy to rule out lesions in lower GI. Last colonoscopy was many years ago and likely due for regular colonoscopy.  Please discuss with PCP Dr. Sherrie Sport - follow up in 4 months.  I spent more than 25 minutes of face to face time with the patient. Greater than 50% of time was spent in counseling and coordination of care. We have discussed about DDx of abdominal abnormal feeling, recommendation of colonoscopy and outpt PT/OT.   No orders of the defined types were placed in this encounter.    Meds ordered this encounter  Medications  . diclofenac sodium (VOLTAREN) 1 % GEL    Sig:   . DULoxetine (CYMBALTA) 30 MG capsule    Sig:   . ALPRAZolam (XANAX) 0.5 MG tablet    Sig: Take 0.5 mg by mouth 3 (three) times daily.    Patient Instructions  - continue ASA 325 mg and plavix 1/2 tab (37.5mg ) daily for stroke prevention and for stent management - continue to follow up with Dr. Estanislado Pandy for the 2 stage of left MCA aneurysm - continue lipitor for stroke  prevention - check BP at home - Follow up with your primary care physician for stroke risk factor modification. Recommend maintain blood pressure goal <130/80, diabetes with hemoglobin A1c goal below 6.5% and lipids with LDL cholesterol goal below 70 mg/dL.  - continue to abstain from smoking - aggressive with PT/OT and do your own exercise at home - follow up in 3 months.  Rosalin Hawking, MD PhD [facility] [address] [place], [place] [address] [phone]

## 2016-03-16 ENCOUNTER — Encounter (HOSPITAL_COMMUNITY): Payer: Medicare Other | Admitting: Physical Therapy

## 2016-03-17 ENCOUNTER — Telehealth: Payer: Self-pay | Admitting: Neurology

## 2016-03-17 NOTE — Telephone Encounter (Signed)
Patient is calling. She says Dr. Erlinda Hong wants her to have a colonoscopy but her primary doctor says he needs a letter stating the patient is healthy enough to have this procedure and needs surgical clearance.The letter needs to be faxed to Dr. Sherrie Sport at [phone].

## 2016-03-20 ENCOUNTER — Telehealth (HOSPITAL_COMMUNITY): Payer: Self-pay | Admitting: Physical Therapy

## 2016-03-20 ENCOUNTER — Ambulatory Visit (HOSPITAL_COMMUNITY): Payer: Medicare Other | Attending: Neurology | Admitting: Physical Therapy

## 2016-03-20 ENCOUNTER — Encounter: Payer: Self-pay | Admitting: Neurology

## 2016-03-20 NOTE — Telephone Encounter (Signed)
Letter written. Please see at letter section. Will fax over to Dr. Sherrie Sport in am. Thanks   Rosalin Hawking, MD PhD Stroke Neurology 03/20/2016 6:28 PM

## 2016-03-20 NOTE — Telephone Encounter (Signed)
Patient a no-show for today's appointment (consecutive no-show #1). Attempted to call however line did not appear to be working correctly, unable to speak to patient or husband and unable to leave message.  Deniece Ree PT, DPT [phone]

## 2016-03-20 NOTE — Telephone Encounter (Signed)
Patient is calling back in regard the letter for her colonoscopy.  Please fax letter asap.

## 2016-03-21 NOTE — Telephone Encounter (Signed)
Unable to get in contact with patient if letter was sent to PCP per Dr. Erlinda Hong. Rn call twice and someone pick up the phone and hung up twice and did not say anything.

## 2016-03-23 ENCOUNTER — Encounter (HOSPITAL_COMMUNITY): Payer: Self-pay | Admitting: Physical Therapy

## 2016-03-23 ENCOUNTER — Ambulatory Visit (HOSPITAL_COMMUNITY): Payer: Medicare Other | Admitting: Physical Therapy

## 2016-03-23 ENCOUNTER — Telehealth (HOSPITAL_COMMUNITY): Payer: Self-pay | Admitting: Physical Therapy

## 2016-03-23 NOTE — Telephone Encounter (Signed)
Patient a no-show for today's session (#2 consecutively); spoke to patient's husband, who reported that patient has been having a lot of trouble with her stomach and she just has not been feeling good at all, they are waiting on MDs to get a colonoscopy done. Educated husband about clinic no-show policy,and he requests that we go ahead and discharge the patient for now- they will get a new MD order if needed when the patient is feeling better.   Deniece Ree PT, DPT [phone]

## 2016-03-23 NOTE — Therapy (Signed)
[place], [place], [address] Phone: [phone]   Fax:  [phone]  Patient Details  Name: Diane Hutchinson MRN: [id number] Date of Birth: 12/02/1945 Referring Provider:  No ref. provider found  Encounter Date: 03/23/2016   PHYSICAL THERAPY DISCHARGE SUMMARY  Visits from Start of Care: 7  Current functional level related to goals / functional outcomes: Patient has had two consecutive no-shows in a row; called and spoke to patient's husband, who requests DC from skilled PT services for now due to ongoing medical issues patient is having with her stomach, they are waiting on a colonscopy to be done. They will speak to MD and potentially get new order for PT once she is feeling better.    Remaining deficits: Unable to assess as patient has not returned for 4+ weeks    Education / Equipment: Will need new MD order to return to skilled PT services  Plan: Patient agrees to discharge.  Patient goals were not met. Patient is being discharged due to the patient's request.  ?????        Deniece Ree PT, DPT  [place], [place], [address] Phone: [phone]   Fax:  [phone]

## 2016-03-27 ENCOUNTER — Encounter (HOSPITAL_COMMUNITY): Payer: Medicare Other | Admitting: Physical Therapy

## 2016-03-30 ENCOUNTER — Encounter (HOSPITAL_COMMUNITY): Payer: Medicare Other | Admitting: Physical Therapy

## 2016-04-03 ENCOUNTER — Encounter (HOSPITAL_COMMUNITY): Payer: Medicare Other | Admitting: Physical Therapy

## 2016-04-28 DIAGNOSIS — I63132 Cerebral infarction due to embolism of left carotid artery: Secondary | ICD-10-CM | POA: Diagnosis not present

## 2016-04-28 DIAGNOSIS — E784 Other hyperlipidemia: Secondary | ICD-10-CM | POA: Diagnosis not present

## 2016-04-28 DIAGNOSIS — I10 Essential (primary) hypertension: Secondary | ICD-10-CM | POA: Diagnosis not present

## 2016-05-12 DIAGNOSIS — M25552 Pain in left hip: Secondary | ICD-10-CM | POA: Diagnosis not present

## 2016-05-12 DIAGNOSIS — M79602 Pain in left arm: Secondary | ICD-10-CM | POA: Diagnosis not present

## 2016-05-15 DIAGNOSIS — I63132 Cerebral infarction due to embolism of left carotid artery: Secondary | ICD-10-CM | POA: Diagnosis not present

## 2016-05-15 DIAGNOSIS — I10 Essential (primary) hypertension: Secondary | ICD-10-CM | POA: Diagnosis not present

## 2016-05-15 DIAGNOSIS — E784 Other hyperlipidemia: Secondary | ICD-10-CM | POA: Diagnosis not present

## 2016-05-31 DIAGNOSIS — F332 Major depressive disorder, recurrent severe without psychotic features: Secondary | ICD-10-CM | POA: Diagnosis not present

## 2016-06-01 DIAGNOSIS — Z1211 Encounter for screening for malignant neoplasm of colon: Secondary | ICD-10-CM | POA: Diagnosis not present

## 2016-06-01 DIAGNOSIS — K625 Hemorrhage of anus and rectum: Secondary | ICD-10-CM | POA: Diagnosis not present

## 2016-06-15 DIAGNOSIS — E784 Other hyperlipidemia: Secondary | ICD-10-CM | POA: Diagnosis not present

## 2016-06-15 DIAGNOSIS — I10 Essential (primary) hypertension: Secondary | ICD-10-CM | POA: Diagnosis not present

## 2016-06-15 DIAGNOSIS — I63132 Cerebral infarction due to embolism of left carotid artery: Secondary | ICD-10-CM | POA: Diagnosis not present

## 2016-07-10 DIAGNOSIS — I6789 Other cerebrovascular disease: Secondary | ICD-10-CM | POA: Diagnosis not present

## 2016-07-10 DIAGNOSIS — I10 Essential (primary) hypertension: Secondary | ICD-10-CM | POA: Diagnosis not present

## 2016-07-18 DIAGNOSIS — I10 Essential (primary) hypertension: Secondary | ICD-10-CM | POA: Diagnosis not present

## 2016-07-18 DIAGNOSIS — I6789 Other cerebrovascular disease: Secondary | ICD-10-CM | POA: Diagnosis not present

## 2016-07-19 ENCOUNTER — Ambulatory Visit: Payer: Medicare Other | Admitting: Neurology

## 2016-07-20 ENCOUNTER — Encounter: Payer: Self-pay | Admitting: Neurology

## 2016-08-16 DIAGNOSIS — E784 Other hyperlipidemia: Secondary | ICD-10-CM | POA: Diagnosis not present

## 2016-08-16 DIAGNOSIS — I10 Essential (primary) hypertension: Secondary | ICD-10-CM | POA: Diagnosis not present

## 2016-08-16 DIAGNOSIS — I63132 Cerebral infarction due to embolism of left carotid artery: Secondary | ICD-10-CM | POA: Diagnosis not present

## 2016-08-31 ENCOUNTER — Ambulatory Visit (INDEPENDENT_AMBULATORY_CARE_PROVIDER_SITE_OTHER): Payer: Medicare Other | Admitting: Neurology

## 2016-08-31 ENCOUNTER — Encounter: Payer: Self-pay | Admitting: Neurology

## 2016-08-31 VITALS — BP 99/66 | HR 56 | Ht 67.0 in | Wt 146.0 lb

## 2016-08-31 DIAGNOSIS — G629 Polyneuropathy, unspecified: Secondary | ICD-10-CM

## 2016-08-31 DIAGNOSIS — Z8679 Personal history of other diseases of the circulatory system: Secondary | ICD-10-CM

## 2016-08-31 DIAGNOSIS — F17200 Nicotine dependence, unspecified, uncomplicated: Secondary | ICD-10-CM | POA: Diagnosis not present

## 2016-08-31 DIAGNOSIS — I671 Cerebral aneurysm, nonruptured: Secondary | ICD-10-CM

## 2016-08-31 DIAGNOSIS — I63132 Cerebral infarction due to embolism of left carotid artery: Secondary | ICD-10-CM | POA: Diagnosis not present

## 2016-08-31 MED ORDER — GABAPENTIN 300 MG PO CAPS: ORAL_CAPSULE | ORAL | 5 refills | Status: DC

## 2016-08-31 NOTE — Patient Instructions (Addendum)
-   continue ASA 325 mg and plavix 37.5mg  and lipitor for stroke prevention and for stent management - will contact Dr. Estanislado Pandy to set up follow up appointment. - check BP at home and record - Follow up with your primary care physician for stroke risk factor modification. Recommend maintain blood pressure goal <130/80, diabetes with hemoglobin A1c goal below 6.5% and lipids with LDL cholesterol goal below 70 mg/dL.  - quit smoking - healthy diet and regular exercise - in addition to gabapentin 300mg  three times a day, take extra pill at night for one week and then take extra two pills at night.  - follow up in 4 months.

## 2016-08-31 NOTE — Progress Notes (Signed)
STROKE NEUROLOGY FOLLOW UP NOTE  NAME: Diane Hutchinson DOB: 19-Apr-1946  REASON FOR VISIT: stroke follow up HISTORY FROM: husband and chart  Today we had the pleasure of seeing Diane Hutchinson in follow-up at our Neurology Clinic. Pt was accompanied by husband.   History Summary Diane Hutchinson is a 70 y.o. 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 Jaynee Eagles on 11/24/14 for clearance for right breast elective surgery. As per chart, she was admitted to [facility] on 11/08/2009 with severe headache, nausea and vomiting and CT scan showed SAH. Cerebral angiogram revealed a right PICA aneurysm, left MCA aneurysm and right MCA aneurysm s/p clipping in 1993. The right PICA aneurysm was coiled by Dr. Patrecia Pour. However, the left MCA aneurysm still left without treated. Due to higher risk of rupture during general anesthesia, Dr. Jaynee Eagles performed CTA head and referred pt to Dr. Estanislado Pandy for management of left MCA aneurysm.  On 01/22/15, pt had first step of stent assisted left widenecked MCA aneurysm procedure. After the procedure, pt developed left UE and LE numbness, tingling, weakness and ataxia. Pt not a tPA candidate secondary to being on IV heparin drip and elevated APTT. She can not have MRI. Repeat CT confirmed right thalamus/posterior IC, considering related to procedure. She was continued on ASA 325 and plavix 75 as well as lipitor 40. She was also recommended to quit smoking. She was sent to CIR after discharge.  She has hx of HLD and on lipitor and lopid since 1993. Hx of HTN and taking metoprolol.  Follow up 03/04/15 - the patient has been doing relatively stable. She finished CIR and discharged home with home therapy 3 times a week. She has quit smoking since March admission. She still has left sided weakness but  slow improving. She will have the 2nd stage of left MCA aneurysm treatment in about 2 months with Dr. Estanislado Pandy. After that, she will decide on the elective right breast procedure (not cancer as per pt). Her BP 149/71 but she said at home the therapist checked on her, it was always at 120s. Her platelet function assay was quite low on last check, therefore her plavix was decreased to half tablets.  Follow up 06/08/15 - the patient has been doing well. Left-sided weakness much more improved than last visit. She came in with wheelchair, however at home she was able to work with therapist to walk with walker. She has been following up with Dr. Letta Pate for rehabilitation. Blood pressure much improved, today in clinic 113/61. She still on aspirin 325 mg and half tablet of Plavix.  Follow up 12/09/15 - pt initially quit smoking but now resumed smoking. Husband stated that she could be one day very good, walking well, but then the other day, she has left side flaccid, not able to walk and can drop to the ground. She sometimes complain of left knee pain. She does have anxiety and depression, following with psychiatrist and on Xanax, Risperdal, Zoloft and trazodone. She has appointment with Dr. Estanislado Pandy tomorrow. BP at home stable, 120-130. Today in clinic 111/57.  Follow-up 03/13/2016 - pt has been doing the same. Resumed smoking and not willing to quit at this time. Still has left UE weakness and finger difficulty. Able to walk with hermiwalker now. However, pt has complains of feeling "something in my  bowel". She felt intermittently "wide belt", "tight knots", "beads-like objects" in her gut, moving along from side to side. Husband stated that she has asked him to take them out from her but husband can not see anything. Pt has intermittent diarrhea and constipation, no abnormal stool or hemorrhoids or rectal prolapse. Husband did not see anything abnormal from rectum. They stated that this feeling aggravated pt  very much and they are frustrated. She had CT abd/pelvis and did not show findings to explain about her feeling. Her PCP considered may related to her stroke as per pt. She had colonoscopy many years ago and it was normal. She is on Xanax for anxiety.  Interval History During the interval time, patient has been doing the same. Continue to smok, and not leading to quit at this time. Walk with semi-walker outside but without assistance at home. Still complains of left abdomen abnormal feelings, feels like of wires coming out from left side abdomen and she has to pull it out and cut it. It happens everyday and especially at night. PCP and GI do not feel colonoscopy is necessary. She is on Neurontin 300 mg tid. She is also on dural antiplatelet and Lipitor. BP on the low side 99/66. Has not follow with Dr. Estanislado Pandy since 12/2015.   REVIEW OF SYSTEMS: Full 14 system review of systems performed and notable only for those listed below and in HPI above, all others are negative:  Constitutional:   Cardiovascular:  Ear/Nose/Throat: runny nose Skin:  Eyes:   Respiratory:   Gastroitestinal:   Genitourinary:  Hematology/Lymphatic:   Endocrine:  Musculoskeletal:  Walking difficulty Allergy/Immunology:   Neurological:  weakess, speech difficulty Psychiatric: Nervous, anxious Sleep:   The following represents the patient's updated allergies and side effects list: Allergies  Allergen Reactions  . Codeine Other (See Comments)    dellusions  . Meperidine Hcl Other (See Comments)    Hurts stomach  . Morphine Other (See Comments)    dellusions  . Sulfonamide Derivatives Other (See Comments)    Drives her nuts    The neurologically relevant items on the patient's problem list were reviewed on today's visit.  Neurologic Examination  A problem focused neurological exam (12 or more points of the single system neurologic examination, vital signs counts as 1 point, cranial nerves count for 8 points) was  performed.  Blood pressure 99/66, pulse (!) 56, height 5\' 7"  (1.702 m), weight 146 lb (66.2 kg).  General - Well nourished, well developed, in no apparent distress.  Ophthalmologic - fundi not visualized due to eye movement.  Cardiovascular - Regular rate and rhythm with no murmur.  Mental Status -  Level of arousal and orientation to time, place, and person were intact. Language including expression, naming, repetition, comprehension was assessed and found intact. Fund of Knowledge was assessed and was intact.  Cranial Nerves II - XII - II - Visual field intact OU. III, IV, VI - Extraocular movements intact. V - Facial sensation symmetrical bilaterally VII - Facial movement intact bilaterally. VIII - Hearing & vestibular intact bilaterally. X - Palate elevates symmetrically. XI - Chin turning & shoulder shrug intact bilaterally. XII - Tongue protrusion intact.  Motor Strength - The patient's strength was 4+/5 LUE proximal and distal with left hand drift and finger dexterity difficulty, 5-/5 LLE proximal and distal.  Bulk was normal and fasciculations were absent.   Motor Tone - Muscle tone was assessed at the neck and appendages and was normal.  Reflexes - The  patient's reflexes were normal in all extremities and she had no pathological reflexes.  Sensory - Light touch, temperature/pinprick were assessed and were subjectively decreased on the left UE and LE.    Coordination - No ataxia but slow on test of left FTN.  Tremor was absent.  Gait and Station - difficulty getting out of chair without assistance, able to walk with hemi-walker, but significant left hemiparetic gait.  Data reviewed: I personally reviewed the images and agree with the radiology interpretations.  CTA head 12/03/14 - 1. Prior right MCA bifurcation aneurysm clipping and prior right PICA aneurysm coiling without evidence of residual/recurrent aneurysm. 2. Unchanged 4.5 mm left MCA bifurcation aneurysm. 3.  No acute intracranial abnormality.  CTA head and neck 01/20/15 -  Pipeline stent placed in the left MCA spanning a left MCA aneurysm. No complications seen relative to that. No missing vessels. No hemorrhage. One could question mild spasm of the MCA branch just beyond the end of the stent, but the vessel is widely patent beyond that and appears as it did before. Previously clipped right MCA region aneurysm. No missing vessels demonstrated on the right compared to the study of 12/03/2014. 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  CT head 01/22/15 -  Changes consistent with prior aneurysm coiling, clipping and stenting as described. New rounded area of decreased attenuation in the right thalamus laterally consistent with an evolving area of ischemia.  2D echo - Normal LV size and systolic function, EF [id number]. Normal RV size and systolic function. No significant valvular abnormalities. Mild LAE.  CT abd/pelvis 12/03/15 -  1. No acute abnormality seen to explain the patient's symptoms. No evidence of traumatic injury to the abdomen or pelvis.  2. Chronic compression deformity involving the superior endplate of th L2 appears mildly worsened from 2016. The L1 vertebral body is unremarkable in appearance; previously suspected compression deformity at L1 simply reflects artifact form positioning.  3. Moderate left-sided renal atrophy noted. 4. Scattered calcification along the abdominal aorta and its branches, with likely moderate to severe luminal narrowing along the proximal right common iliac artery 5. Scattered diverticulosis along with sigmoid colon, without evidence of diverticulitis.  Component     Latest Ref Rng 01/21/2015 01/22/2015  Cholesterol     0 - 200 mg/dL  116  Triglycerides     <150 mg/dL  146  HDL Cholesterol     >39 mg/dL  34 (L)  Total CHOL/HDL Ratio       3.4  VLDL     0 - 40 mg/dL  29  LDL (calc)     0 - 99 mg/dL  53    Hemoglobin A1C     4.8 - 5.6 % 6.0 (H)   Mean Plasma Glucose      126     Assessment: As you may recall, she is a 70 y.o. Caucasian female with PMH of HTN, HLD, right MCA aneurysm s/p clippering in 1993, [place] with right PICA ruptured aneurysm s/p coiling on 11/08/2009 was admitted for left MCA aneurysm management with pipe-line stent on 01/22/15, found to have right thalamic/posterior IC stroke post procedure. Pt stroke risk factor including smoker, hx of HTN and HLD, but LDL controlled well on meds. Her stroke still most likely due to procedure, especially posterior IC stroke may due to embolic source from AchA coming out directly from ICA. She was continued on ASA 325mg  and with half tab plavix (due to low P2Y12) as well as  lipitor. Follow up with Dr. Estanislado Pandy for management of left MCA aneurysm. Over time, her left-sided weakness and numbness much improved, currently able to walk with semi-walker. She initially quit smoking but now resumedAnd not willing to quit. Recently, she started to have strange feeling at left abdomen, CT abd/pelvis not revealing, PCP and GI feel colonoscopy is not necessary. Will need to increase gabapentin dose.  Plan:   - continue ASA 325 mg and plavix 37.5mg  and lipitor for stroke prevention and for stent management - will contact Dr. Estanislado Pandy to set up follow up appointment. - check BP at home and record - Follow up with your primary care physician for stroke risk factor modification. Recommend maintain blood pressure goal <130/80, diabetes with hemoglobin A1c goal below 6.5% and lipids with LDL cholesterol goal below 70 mg/dL.  - quit smoking - healthy diet and regular exercise - in addition to gabapentin 300mg  three times a day, take extra pill at night for one week and then take extra two pills at night.  - follow up in 4 months.  I spent more than 25 minutes of face to face time with the patient. Greater than 50% of time was spent in counseling and coordination  of care. We have discussed about gabapentin dose increase, and follow-up with Dr Estanislado Pandy.   No orders of the defined types were placed in this encounter.   Meds ordered this encounter  Medications  . gabapentin (NEURONTIN) 300 MG capsule    Sig: Take 300mg  at night along with the regular one at night for a week, and then take 600mg  along with the regular one at night    Dispense:  60 capsule    Refill:  5    Patient Instructions  - continue ASA 325 mg and plavix 1/2 tab (37.5mg ) daily for stroke prevention and for stent management - continue to follow up with Dr. Estanislado Pandy for the 2 stage of left MCA aneurysm - continue lipitor for stroke prevention - check BP at home - Follow up with your primary care physician for stroke risk factor modification. Recommend maintain blood pressure goal <130/80, diabetes with hemoglobin A1c goal below 6.5% and lipids with LDL cholesterol goal below 70 mg/dL.  - continue to abstain from smoking - aggressive with PT/OT and do your own exercise at home - follow up in 3 months.  Rosalin Hawking, MD PhD [facility] [address] [place], [place] [address] [phone]

## 2016-09-05 DIAGNOSIS — Z8673 Personal history of transient ischemic attack (TIA), and cerebral infarction without residual deficits: Secondary | ICD-10-CM | POA: Diagnosis not present

## 2016-09-05 DIAGNOSIS — F419 Anxiety disorder, unspecified: Secondary | ICD-10-CM | POA: Diagnosis not present

## 2016-09-05 DIAGNOSIS — F329 Major depressive disorder, single episode, unspecified: Secondary | ICD-10-CM | POA: Diagnosis not present

## 2016-09-05 DIAGNOSIS — Z7982 Long term (current) use of aspirin: Secondary | ICD-10-CM | POA: Diagnosis not present

## 2016-09-05 DIAGNOSIS — K5900 Constipation, unspecified: Secondary | ICD-10-CM | POA: Diagnosis not present

## 2016-09-05 DIAGNOSIS — Z79899 Other long term (current) drug therapy: Secondary | ICD-10-CM | POA: Diagnosis not present

## 2016-09-05 DIAGNOSIS — F17200 Nicotine dependence, unspecified, uncomplicated: Secondary | ICD-10-CM | POA: Diagnosis not present

## 2016-09-08 ENCOUNTER — Other Ambulatory Visit (HOSPITAL_COMMUNITY): Payer: Self-pay | Admitting: Interventional Radiology

## 2016-09-08 DIAGNOSIS — I671 Cerebral aneurysm, nonruptured: Secondary | ICD-10-CM

## 2016-09-17 DIAGNOSIS — Z8673 Personal history of transient ischemic attack (TIA), and cerebral infarction without residual deficits: Secondary | ICD-10-CM | POA: Diagnosis not present

## 2016-09-17 DIAGNOSIS — M25512 Pain in left shoulder: Secondary | ICD-10-CM | POA: Diagnosis not present

## 2016-09-17 DIAGNOSIS — M25562 Pain in left knee: Secondary | ICD-10-CM | POA: Diagnosis not present

## 2016-09-17 DIAGNOSIS — S4992XA Unspecified injury of left shoulder and upper arm, initial encounter: Secondary | ICD-10-CM | POA: Diagnosis not present

## 2016-09-17 DIAGNOSIS — R079 Chest pain, unspecified: Secondary | ICD-10-CM | POA: Diagnosis not present

## 2016-09-17 DIAGNOSIS — M25572 Pain in left ankle and joints of left foot: Secondary | ICD-10-CM | POA: Diagnosis not present

## 2016-09-17 DIAGNOSIS — W1809XA Striking against other object with subsequent fall, initial encounter: Secondary | ICD-10-CM | POA: Diagnosis not present

## 2016-09-17 DIAGNOSIS — F17200 Nicotine dependence, unspecified, uncomplicated: Secondary | ICD-10-CM | POA: Diagnosis not present

## 2016-09-17 DIAGNOSIS — E7800 Pure hypercholesterolemia, unspecified: Secondary | ICD-10-CM | POA: Diagnosis not present

## 2016-09-17 DIAGNOSIS — M1712 Unilateral primary osteoarthritis, left knee: Secondary | ICD-10-CM | POA: Diagnosis not present

## 2016-09-17 DIAGNOSIS — S40012A Contusion of left shoulder, initial encounter: Secondary | ICD-10-CM | POA: Diagnosis not present

## 2016-09-17 DIAGNOSIS — Z79899 Other long term (current) drug therapy: Secondary | ICD-10-CM | POA: Diagnosis not present

## 2016-09-17 DIAGNOSIS — S8992XA Unspecified injury of left lower leg, initial encounter: Secondary | ICD-10-CM | POA: Diagnosis not present

## 2016-09-17 DIAGNOSIS — S0083XA Contusion of other part of head, initial encounter: Secondary | ICD-10-CM | POA: Diagnosis not present

## 2016-09-17 DIAGNOSIS — F419 Anxiety disorder, unspecified: Secondary | ICD-10-CM | POA: Diagnosis not present

## 2016-09-17 DIAGNOSIS — S0990XA Unspecified injury of head, initial encounter: Secondary | ICD-10-CM | POA: Diagnosis not present

## 2016-09-17 DIAGNOSIS — Z7982 Long term (current) use of aspirin: Secondary | ICD-10-CM | POA: Diagnosis not present

## 2016-09-17 DIAGNOSIS — F329 Major depressive disorder, single episode, unspecified: Secondary | ICD-10-CM | POA: Diagnosis not present

## 2016-09-18 DIAGNOSIS — I10 Essential (primary) hypertension: Secondary | ICD-10-CM | POA: Diagnosis not present

## 2016-09-18 DIAGNOSIS — E784 Other hyperlipidemia: Secondary | ICD-10-CM | POA: Diagnosis not present

## 2016-09-18 DIAGNOSIS — I63132 Cerebral infarction due to embolism of left carotid artery: Secondary | ICD-10-CM | POA: Diagnosis not present

## 2016-09-21 ENCOUNTER — Telehealth: Payer: Self-pay | Admitting: Neurology

## 2016-09-21 NOTE — Telephone Encounter (Signed)
Pt's husband called to advise the pt increased gabapentin (NEURONTIN) 300 MG capsule to 2 pills/night x 1 week then 3 pills/night x 1 wk. He said it did not help so as directed they decreased back  1am and 2 night. He said she is pulling at her waist late in the evening and late at night. He said to increase the dose did not help. He is "at my wits end". Please call at [phone]

## 2016-09-22 ENCOUNTER — Ambulatory Visit (HOSPITAL_COMMUNITY)
Admission: RE | Admit: 2016-09-22 | Discharge: 2016-09-22 | Disposition: A | Discharge status: Home / Self Care | Payer: Medicare Other | Source: Ambulatory Visit | Attending: Interventional Radiology | Admitting: Interventional Radiology

## 2016-09-22 DIAGNOSIS — G249 Dystonia, unspecified: Secondary | ICD-10-CM | POA: Diagnosis not present

## 2016-09-22 DIAGNOSIS — I671 Cerebral aneurysm, nonruptured: Secondary | ICD-10-CM

## 2016-09-22 HISTORY — PX: IR GENERIC HISTORICAL: IMG1180011

## 2016-09-22 NOTE — Telephone Encounter (Signed)
Called the number on file. There is no one pick up and voice message did not setup. Do not know if the number is correct or if she can get the VM message. Anyway, I left message for her to call back.  Rosalin Hawking, MD PhD Stroke Neurology 09/22/2016 3:49 PM

## 2016-09-24 NOTE — Telephone Encounter (Signed)
Called patient and her husband and discussed with them over the phone. Husband said patient take 3 pills of gabapentin 300 mg at bedtime did not help for her symptoms. She still has feelings of "wires" or "credit cards", "coming out from her left abdomen and she has to pulled it out and cut it". It happens normally starting from 4-5 PM and throughout the night. She takes gabapentin, trazodone, Risperdal at bedtune and she sleeps well.   I asked husband to give her gabapentin at 2pm instead of bedtime to see if this will help her the symptoms. He expressed understanding and appreciation.  Rosalin Hawking, MD PhD Stroke Neurology 09/24/2016 4:12 PM

## 2016-09-25 ENCOUNTER — Encounter (HOSPITAL_COMMUNITY): Payer: Self-pay | Admitting: Interventional Radiology

## 2016-09-26 DIAGNOSIS — Z23 Encounter for immunization: Secondary | ICD-10-CM | POA: Diagnosis not present

## 2016-10-10 ENCOUNTER — Other Ambulatory Visit (HOSPITAL_COMMUNITY): Payer: Self-pay | Admitting: Interventional Radiology

## 2016-10-10 DIAGNOSIS — Z0000 Encounter for general adult medical examination without abnormal findings: Secondary | ICD-10-CM | POA: Diagnosis not present

## 2016-10-10 DIAGNOSIS — Z131 Encounter for screening for diabetes mellitus: Secondary | ICD-10-CM | POA: Diagnosis not present

## 2016-10-10 DIAGNOSIS — I6789 Other cerebrovascular disease: Secondary | ICD-10-CM | POA: Diagnosis not present

## 2016-10-10 DIAGNOSIS — I10 Essential (primary) hypertension: Secondary | ICD-10-CM | POA: Diagnosis not present

## 2016-10-10 DIAGNOSIS — I639 Cerebral infarction, unspecified: Secondary | ICD-10-CM

## 2016-10-10 DIAGNOSIS — Z1389 Encounter for screening for other disorder: Secondary | ICD-10-CM | POA: Diagnosis not present

## 2016-10-23 ENCOUNTER — Encounter (HOSPITAL_COMMUNITY): Payer: Self-pay

## 2016-10-23 ENCOUNTER — Ambulatory Visit (HOSPITAL_COMMUNITY): Payer: Medicare Other

## 2016-10-23 ENCOUNTER — Ambulatory Visit (HOSPITAL_COMMUNITY)
Admission: RE | Admit: 2016-10-23 | Discharge: 2016-10-23 | Disposition: A | Discharge status: Home / Self Care | Payer: Medicare Other | Source: Ambulatory Visit | Attending: Interventional Radiology | Admitting: Interventional Radiology

## 2016-10-23 DIAGNOSIS — Z95828 Presence of other vascular implants and grafts: Secondary | ICD-10-CM | POA: Diagnosis not present

## 2016-10-23 DIAGNOSIS — Z8673 Personal history of transient ischemic attack (TIA), and cerebral infarction without residual deficits: Secondary | ICD-10-CM | POA: Diagnosis not present

## 2016-10-23 DIAGNOSIS — I639 Cerebral infarction, unspecified: Secondary | ICD-10-CM | POA: Diagnosis not present

## 2016-10-23 DIAGNOSIS — I671 Cerebral aneurysm, nonruptured: Secondary | ICD-10-CM | POA: Diagnosis not present

## 2016-10-23 DIAGNOSIS — I6522 Occlusion and stenosis of left carotid artery: Secondary | ICD-10-CM | POA: Insufficient documentation

## 2016-10-23 DIAGNOSIS — I63032 Cerebral infarction due to thrombosis of left carotid artery: Secondary | ICD-10-CM | POA: Diagnosis not present

## 2016-10-23 LAB — POCT I-STAT CREATININE: Creatinine, Ser: 0.9 mg/dL (ref 0.44–1.00)

## 2016-10-23 MED ORDER — IOPAMIDOL (ISOVUE-370) INJECTION 76%
INTRAVENOUS | Status: AC
  Administered 2016-10-23: 50 mL
  Filled 2016-10-23: qty 50

## 2016-10-24 DIAGNOSIS — E784 Other hyperlipidemia: Secondary | ICD-10-CM | POA: Diagnosis not present

## 2016-10-24 DIAGNOSIS — I63132 Cerebral infarction due to embolism of left carotid artery: Secondary | ICD-10-CM | POA: Diagnosis not present

## 2016-10-24 DIAGNOSIS — I10 Essential (primary) hypertension: Secondary | ICD-10-CM | POA: Diagnosis not present

## 2016-10-27 DIAGNOSIS — Z1231 Encounter for screening mammogram for malignant neoplasm of breast: Secondary | ICD-10-CM | POA: Diagnosis not present

## 2016-11-16 ENCOUNTER — Telehealth (HOSPITAL_COMMUNITY): Payer: Self-pay

## 2016-11-16 NOTE — Telephone Encounter (Signed)
Pt agreed to f/u in 1 yr with ct head and neck. AW

## 2016-11-17 DIAGNOSIS — M25561 Pain in right knee: Secondary | ICD-10-CM | POA: Diagnosis not present

## 2016-12-13 DIAGNOSIS — E784 Other hyperlipidemia: Secondary | ICD-10-CM | POA: Diagnosis not present

## 2016-12-13 DIAGNOSIS — I638 Other cerebral infarction: Secondary | ICD-10-CM | POA: Diagnosis not present

## 2016-12-13 DIAGNOSIS — I10 Essential (primary) hypertension: Secondary | ICD-10-CM | POA: Diagnosis not present

## 2016-12-25 DIAGNOSIS — M810 Age-related osteoporosis without current pathological fracture: Secondary | ICD-10-CM | POA: Diagnosis not present

## 2016-12-25 DIAGNOSIS — M85831 Other specified disorders of bone density and structure, right forearm: Secondary | ICD-10-CM | POA: Diagnosis not present

## 2017-01-01 ENCOUNTER — Encounter: Payer: Self-pay | Admitting: Neurology

## 2017-01-01 ENCOUNTER — Ambulatory Visit (INDEPENDENT_AMBULATORY_CARE_PROVIDER_SITE_OTHER): Payer: Medicare Other | Admitting: Neurology

## 2017-01-01 VITALS — BP 106/72 | HR 43

## 2017-01-01 DIAGNOSIS — I63132 Cerebral infarction due to embolism of left carotid artery: Secondary | ICD-10-CM

## 2017-01-01 DIAGNOSIS — I10 Essential (primary) hypertension: Secondary | ICD-10-CM | POA: Diagnosis not present

## 2017-01-01 DIAGNOSIS — E785 Hyperlipidemia, unspecified: Secondary | ICD-10-CM

## 2017-01-01 DIAGNOSIS — M25561 Pain in right knee: Secondary | ICD-10-CM

## 2017-01-01 DIAGNOSIS — I671 Cerebral aneurysm, nonruptured: Secondary | ICD-10-CM

## 2017-01-01 DIAGNOSIS — Z8679 Personal history of other diseases of the circulatory system: Secondary | ICD-10-CM

## 2017-01-01 DIAGNOSIS — F17200 Nicotine dependence, unspecified, uncomplicated: Secondary | ICD-10-CM | POA: Diagnosis not present

## 2017-01-01 NOTE — Patient Instructions (Addendum)
-   continue ASA 325 mg and plavix 37.5mg  and lipitor for stroke prevention and for stent management - continue follow up with Dr. Estanislado Pandy  - check BP at home and record - Follow up with your primary care physician for stroke risk factor modification. Recommend maintain blood pressure goal <130/80, diabetes with hemoglobin A1c goal below 6.5% and lipids with LDL cholesterol goal below 70 mg/dL.  - quit smoking - will refer you to see orthopedics for right knee pain and not able to walk.  - stop gabapentin in the morning, take 3 tablets in the afternoon for one week, then 2 tablets for one week, and then one tablet for one week and then off - wait to see what colonoscopy find. If negative, we may think about other medications.  - follow up in 3 months with me.

## 2017-01-01 NOTE — Progress Notes (Signed)
STROKE NEUROLOGY FOLLOW UP NOTE  NAME: Diane Hutchinson DOB: 19-Apr-1946  REASON FOR VISIT: stroke follow up HISTORY FROM: husband and chart  Today we had the pleasure of seeing Diane Hutchinson in follow-up at our Neurology Clinic. Pt was accompanied by husband.   History Summary Diane Hutchinson is a 71 y.o. 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 Jaynee Eagles on 11/24/14 for clearance for right breast elective surgery. As per chart, she was admitted to [facility] on 11/08/2009 with severe headache, nausea and vomiting and CT scan showed SAH. Cerebral angiogram revealed a right PICA aneurysm, left MCA aneurysm and right MCA aneurysm s/p clipping in 1993. The right PICA aneurysm was coiled by Dr. Patrecia Pour. However, the left MCA aneurysm still left without treated. Due to higher risk of rupture during general anesthesia, Dr. Jaynee Eagles performed CTA head and referred pt to Dr. Estanislado Pandy for management of left MCA aneurysm.  On 01/22/15, pt had first step of stent assisted left widenecked MCA aneurysm procedure. After the procedure, pt developed left UE and LE numbness, tingling, weakness and ataxia. Pt not a tPA candidate secondary to being on IV heparin drip and elevated APTT. She can not have MRI. Repeat CT confirmed right thalamus/posterior IC, considering related to procedure. She was continued on ASA 325 and plavix 75 as well as lipitor 40. She was also recommended to quit smoking. She was sent to CIR after discharge.  She has hx of HLD and on lipitor and lopid since 1993. Hx of HTN and taking metoprolol.  Follow up 03/04/15 - the patient has been doing relatively stable. She finished CIR and discharged home with home therapy 3 times a week. She has quit smoking since March admission. She still has left sided weakness but  slow improving. She will have the 2nd stage of left MCA aneurysm treatment in about 2 months with Dr. Estanislado Pandy. After that, she will decide on the elective right breast procedure (not cancer as per pt). Her BP 149/71 but she said at home the therapist checked on her, it was always at 120s. Her platelet function assay was quite low on last check, therefore her plavix was decreased to half tablets.  Follow up 06/08/15 - the patient has been doing well. Left-sided weakness much more improved than last visit. She came in with wheelchair, however at home she was able to work with therapist to walk with walker. She has been following up with Dr. Letta Pate for rehabilitation. Blood pressure much improved, today in clinic 113/61. She still on aspirin 325 mg and half tablet of Plavix.  Follow up 12/09/15 - pt initially quit smoking but now resumed smoking. Husband stated that she could be one day very good, walking well, but then the other day, she has left side flaccid, not able to walk and can drop to the ground. She sometimes complain of left knee pain. She does have anxiety and depression, following with psychiatrist and on Xanax, Risperdal, Zoloft and trazodone. She has appointment with Dr. Estanislado Pandy tomorrow. BP at home stable, 120-130. Today in clinic 111/57.  Follow-up 03/13/2016 - pt has been doing the same. Resumed smoking and not willing to quit at this time. Still has left UE weakness and finger difficulty. Able to walk with hermiwalker now. However, pt has complains of feeling "something in my  bowel". She felt intermittently "wide belt", "tight knots", "beads-like objects" in her gut, moving along from side to side. Husband stated that she has asked him to take them out from her but husband can not see anything. Pt has intermittent diarrhea and constipation, no abnormal stool or hemorrhoids or rectal prolapse. Husband did not see anything abnormal from rectum. They stated that this feeling aggravated pt  very much and they are frustrated. She had CT abd/pelvis and did not show findings to explain about her feeling. Her PCP considered may related to her stroke as per pt. She had colonoscopy many years ago and it was normal. She is on Xanax for anxiety.  Follow up 08/31/16 - patient has been doing the same. Continue to smok, and not willing to quit at this time. Walk with semi-walker outside but without assistance at home. Still complains of left abdomen abnormal feelings, feels like of wires coming out from left side abdomen and she has to pull it out and cut it. It happens everyday and especially at night. PCP and GI do not feel colonoscopy is necessary. She is on Neurontin 300 mg tid. She is also on dural antiplatelet and Lipitor. BP on the low side 99/66. Has not follow with Dr. Estanislado Pandy since 12/2015.   Interval History During the interval time, patient has been doing the same. Continues to smoke and not willing to quit at this time. Continued to complain of left abdomen abnormal feelings. Has been on Neurontin, however not effective even with changing schedule. She had appointment with GI for colonoscopy next month. Had fall and injured right knee, currently not able to walk or weight bearing.   REVIEW OF SYSTEMS: Full 14 system review of systems performed and notable only for those listed below and in HPI above, all others are negative:  Constitutional:   Cardiovascular: Leg swelling Ear/Nose/Throat: hearing loss Skin:  Eyes:   Respiratory:   Gastroitestinal:   Genitourinary:  Hematology/Lymphatic:   Endocrine:  Musculoskeletal:  Walking difficulty, joint pain, aching muscles Allergy/Immunology:   Neurological:  weakess, headache, numbness Psychiatric: Nervous, anxious, agitation, confusion, depression Sleep: Restless leg  The following represents the patient's updated allergies and side effects list: Allergies  Allergen Reactions  . Codeine Other (See Comments)    dellusions  .  Meperidine Hcl Other (See Comments)    Hurts stomach  . Morphine Other (See Comments)    dellusions  . Sulfonamide Derivatives Other (See Comments)    Drives her nuts    The neurologically relevant items on the patient's problem list were reviewed on today's visit.  Neurologic Examination  A problem focused neurological exam (12 or more points of the single system neurologic examination, vital signs counts as 1 point, cranial nerves count for 8 points) was performed.  Blood pressure 106/72, pulse (!) 43.  General - Well nourished, well developed, in no apparent distress.  Ophthalmologic - fundi not visualized due to eye movement.  Cardiovascular - Regular rate and rhythm with no murmur.  Mental Status -  Level of arousal and orientation to time, place, and person were intact. Language including expression, naming, repetition, comprehension was assessed and found intact. Fund of Knowledge was assessed and was intact.  Cranial Nerves II - XII - II - Visual field intact OU. III, IV, VI - Extraocular movements intact. V - Facial sensation symmetrical bilaterally VII - Facial movement intact bilaterally. VIII - Hearing & vestibular intact bilaterally. X - Palate elevates symmetrically. XI - Chin turning &  shoulder shrug intact bilaterally. XII - Tongue protrusion intact.  Motor Strength - The patient's strength was 4+/5 LUE proximal and distal with left hand drift and finger dexterity difficulty, 5-/5 LLE proximal and distal. RLE not able to weight bearing due to right knee pain post fall. Bulk was normal and fasciculations were absent.   Motor Tone - Muscle tone was assessed at the neck and appendages and was normal.  Reflexes - The patient's reflexes were normal in all extremities and she had no pathological reflexes.  Sensory - Light touch, temperature/pinprick were assessed and were subjectively decreased on the left UE and LE.    Coordination - No ataxia but slow on test  of left FTN.  Tremor was absent.  Gait and Station - difficulty getting out of chair without assistance, able to walk with hemi-walker, but significant left hemiparetic gait.  Data reviewed: I personally reviewed the images and agree with the radiology interpretations.  CTA head 12/03/14 - 1. Prior right MCA bifurcation aneurysm clipping and prior right PICA aneurysm coiling without evidence of residual/recurrent aneurysm. 2. Unchanged 4.5 mm left MCA bifurcation aneurysm. 3. No acute intracranial abnormality.  CTA head and neck 01/20/15 -  Pipeline stent placed in the left MCA spanning a left MCA aneurysm. No complications seen relative to that. No missing vessels. No hemorrhage. One could question mild spasm of the MCA branch just beyond the end of the stent, but the vessel is widely patent beyond that and appears as it did before. Previously clipped right MCA region aneurysm. No missing vessels demonstrated on the right compared to the study of 12/03/2014. 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  CT head 01/22/15 -  Changes consistent with prior aneurysm coiling, clipping and stenting as described. New rounded area of decreased attenuation in the right thalamus laterally consistent with an evolving area of ischemia.  2D echo - Normal LV size and systolic function, EF [id number]. Normal RV size and systolic function. No significant valvular abnormalities. Mild LAE.  CT abd/pelvis 12/03/15 -  1. No acute abnormality seen to explain the patient's symptoms. No evidence of traumatic injury to the abdomen or pelvis.  2. Chronic compression deformity involving the superior endplate of th L2 appears mildly worsened from 2016. The L1 vertebral body is unremarkable in appearance; previously suspected compression deformity at L1 simply reflects artifact form positioning.  3. Moderate left-sided renal atrophy noted. 4. Scattered calcification along  the abdominal aorta and its branches, with likely moderate to severe luminal narrowing along the proximal right common iliac artery 5. Scattered diverticulosis along with sigmoid colon, without evidence of diverticulitis.  CTA head and neck 10/23/2016 CT HEAD: No acute intracranial process. Old RIGHT thalamus lacunar infarct. Old RIGHT temporal lobe/ MCA territory infarct.  CTA NECK: Atherosclerosis resulting in tandem moderate stenosis LEFT Common carotid artery origin. No acute vascular process or disease progression.  CTA HEAD: Status post LEFT MCA pipeline stent with stable 3 x 4 mm LEFT middle cerebral artery wide neck aneurysm. Mild suspected stenosis LEFT M2 at distal stent. Status post coil embolization complete occlusion of RIGHT posterior-inferior cerebellar artery aneurysm. Status post aneurysm clipping of RIGHT MCA bifurcation aneurysm without re- cannulization.  Component     Latest Ref Rng 01/21/2015 01/22/2015  Cholesterol     0 - 200 mg/dL  116  Triglycerides     <150 mg/dL  146  HDL Cholesterol     >39 mg/dL  34 (L)  Total CHOL/HDL Ratio  3.4  VLDL     0 - 40 mg/dL  29  LDL (calc)     0 - 99 mg/dL  53  Hemoglobin A1C     4.8 - 5.6 % 6.0 (H)   Mean Plasma Glucose      126     Assessment: As you may recall, she is a 71 y.o. Caucasian female with PMH of HTN, HLD, right MCA aneurysm s/p clippering in 1993, [place] with right PICA ruptured aneurysm s/p coiling on 11/08/2009 was admitted for left MCA aneurysm management with pipe-line stent on 01/22/15, found to have right thalamic/posterior IC stroke post procedure. Pt stroke risk factor including smoker, hx of HTN and HLD, but LDL controlled well on meds. Her stroke still most likely due to procedure, especially posterior IC stroke may due to embolic source from AchA coming out directly from ICA. She was continued on ASA 325mg  and with half tab plavix (due to low P2Y12) as well as lipitor. Follow up with Dr. Estanislado Pandy  for management of left MCA aneurysm. Over time, her left-sided weakness and numbness much improved, currently able to walk with semi-walker. She initially quit smoking but now resumedAnd not willing to quit. She started to have strange feeling at left abdomen, CT abd/pelvis not revealing, pending colonoscopy. Gabapentin not effective, will discontinue for now.  Plan:   - continue ASA 325 mg and plavix 37.5mg  and lipitor for stroke prevention and for stent management - continue follow up with Dr. Estanislado Pandy  - check BP at home and record - Follow up with your primary care physician for stroke risk factor modification. Recommend maintain blood pressure goal <130/80, diabetes with hemoglobin A1c goal below 6.5% and lipids with LDL cholesterol goal below 70 mg/dL.  - quit smoking - will refer you to see orthopedics for right knee pain and not able to walk.  - stop gabapentin in the morning, take 3 tablets in the afternoon for one week, then 2 tablets for one week, and then one tablet for one week and then off - wait to see what colonoscopy find. If negative, we may think about other medications.  - follow up in 3 months with me.  I spent more than 25 minutes of face to face time with the patient. Greater than 50% of time was spent in counseling and coordination of care. We have discussed about  discontinue gabapentin, and follow-up with  GI and Dr Estanislado Pandy.   Orders Placed This Encounter  Procedures  . AMB referral to orthopedics    Referral Priority:   Urgent    Referral Type:   Consultation    Number of Visits Requested:   1    Meds ordered this encounter  Medications  . HYDROcodone-acetaminophen (NORCO/VICODIN) 5-325 MG tablet  . ALPRAZolam (XANAX) 1 MG tablet    Sig: TAKE 1 AND 1/2 TABLETS BY MOUTH FOUR TIMES DAILY    Refill:  0    Patient Instructions  - continue ASA 325 mg and plavix 1/2 tab (37.5mg ) daily for stroke prevention and for stent management - continue to follow up  with Dr. Estanislado Pandy for the 2 stage of left MCA aneurysm - continue lipitor for stroke prevention - check BP at home - Follow up with your primary care physician for stroke risk factor modification. Recommend maintain blood pressure goal <130/80, diabetes with hemoglobin A1c goal below 6.5% and lipids with LDL cholesterol goal below 70 mg/dL.  - continue to abstain from smoking - aggressive with PT/OT  and do your own exercise at home - follow up in 3 months.  Rosalin Hawking, MD PhD [facility] [address], [place], [place] [address] [phone]

## 2017-01-03 DIAGNOSIS — E784 Other hyperlipidemia: Secondary | ICD-10-CM | POA: Diagnosis not present

## 2017-01-03 DIAGNOSIS — M25561 Pain in right knee: Secondary | ICD-10-CM | POA: Insufficient documentation

## 2017-01-03 DIAGNOSIS — I10 Essential (primary) hypertension: Secondary | ICD-10-CM | POA: Diagnosis not present

## 2017-01-03 DIAGNOSIS — F329 Major depressive disorder, single episode, unspecified: Secondary | ICD-10-CM | POA: Diagnosis not present

## 2017-01-03 DIAGNOSIS — I638 Other cerebral infarction: Secondary | ICD-10-CM | POA: Diagnosis not present

## 2017-01-09 DIAGNOSIS — I6789 Other cerebrovascular disease: Secondary | ICD-10-CM | POA: Diagnosis not present

## 2017-01-09 DIAGNOSIS — I10 Essential (primary) hypertension: Secondary | ICD-10-CM | POA: Diagnosis not present

## 2017-01-09 DIAGNOSIS — M545 Low back pain: Secondary | ICD-10-CM | POA: Diagnosis not present

## 2017-01-12 DIAGNOSIS — F319 Bipolar disorder, unspecified: Secondary | ICD-10-CM | POA: Diagnosis not present

## 2017-01-12 DIAGNOSIS — Z7902 Long term (current) use of antithrombotics/antiplatelets: Secondary | ICD-10-CM | POA: Diagnosis not present

## 2017-01-12 DIAGNOSIS — Z7982 Long term (current) use of aspirin: Secondary | ICD-10-CM | POA: Diagnosis not present

## 2017-01-12 DIAGNOSIS — Z882 Allergy status to sulfonamides status: Secondary | ICD-10-CM | POA: Diagnosis not present

## 2017-01-12 DIAGNOSIS — Z8541 Personal history of malignant neoplasm of cervix uteri: Secondary | ICD-10-CM | POA: Diagnosis not present

## 2017-01-12 DIAGNOSIS — Z9049 Acquired absence of other specified parts of digestive tract: Secondary | ICD-10-CM | POA: Diagnosis not present

## 2017-01-12 DIAGNOSIS — Z8249 Family history of ischemic heart disease and other diseases of the circulatory system: Secondary | ICD-10-CM | POA: Diagnosis not present

## 2017-01-12 DIAGNOSIS — Z886 Allergy status to analgesic agent status: Secondary | ICD-10-CM | POA: Diagnosis not present

## 2017-01-12 DIAGNOSIS — Z8673 Personal history of transient ischemic attack (TIA), and cerebral infarction without residual deficits: Secondary | ICD-10-CM | POA: Diagnosis not present

## 2017-01-12 DIAGNOSIS — Z1211 Encounter for screening for malignant neoplasm of colon: Secondary | ICD-10-CM | POA: Diagnosis not present

## 2017-01-12 DIAGNOSIS — I10 Essential (primary) hypertension: Secondary | ICD-10-CM | POA: Diagnosis not present

## 2017-01-12 DIAGNOSIS — F17210 Nicotine dependence, cigarettes, uncomplicated: Secondary | ICD-10-CM | POA: Diagnosis not present

## 2017-01-12 DIAGNOSIS — Z79899 Other long term (current) drug therapy: Secondary | ICD-10-CM | POA: Diagnosis not present

## 2017-01-12 DIAGNOSIS — Z801 Family history of malignant neoplasm of trachea, bronchus and lung: Secondary | ICD-10-CM | POA: Diagnosis not present

## 2017-01-18 DIAGNOSIS — M1711 Unilateral primary osteoarthritis, right knee: Secondary | ICD-10-CM | POA: Diagnosis not present

## 2017-01-29 DIAGNOSIS — E784 Other hyperlipidemia: Secondary | ICD-10-CM | POA: Diagnosis not present

## 2017-01-29 DIAGNOSIS — I10 Essential (primary) hypertension: Secondary | ICD-10-CM | POA: Diagnosis not present

## 2017-01-29 DIAGNOSIS — I63132 Cerebral infarction due to embolism of left carotid artery: Secondary | ICD-10-CM | POA: Diagnosis not present

## 2017-03-01 DIAGNOSIS — M25561 Pain in right knee: Secondary | ICD-10-CM | POA: Diagnosis not present

## 2017-03-01 DIAGNOSIS — S83281A Other tear of lateral meniscus, current injury, right knee, initial encounter: Secondary | ICD-10-CM | POA: Diagnosis not present

## 2017-03-09 DIAGNOSIS — I10 Essential (primary) hypertension: Secondary | ICD-10-CM | POA: Diagnosis not present

## 2017-03-09 DIAGNOSIS — J449 Chronic obstructive pulmonary disease, unspecified: Secondary | ICD-10-CM | POA: Diagnosis not present

## 2017-03-09 DIAGNOSIS — E7800 Pure hypercholesterolemia, unspecified: Secondary | ICD-10-CM | POA: Diagnosis not present

## 2017-03-09 DIAGNOSIS — Z7902 Long term (current) use of antithrombotics/antiplatelets: Secondary | ICD-10-CM | POA: Diagnosis not present

## 2017-03-09 DIAGNOSIS — Z8673 Personal history of transient ischemic attack (TIA), and cerebral infarction without residual deficits: Secondary | ICD-10-CM | POA: Diagnosis not present

## 2017-03-09 DIAGNOSIS — Z7982 Long term (current) use of aspirin: Secondary | ICD-10-CM | POA: Diagnosis not present

## 2017-03-09 DIAGNOSIS — S83281A Other tear of lateral meniscus, current injury, right knee, initial encounter: Secondary | ICD-10-CM | POA: Diagnosis not present

## 2017-03-09 DIAGNOSIS — X58XXXA Exposure to other specified factors, initial encounter: Secondary | ICD-10-CM | POA: Diagnosis not present

## 2017-03-09 DIAGNOSIS — Z79899 Other long term (current) drug therapy: Secondary | ICD-10-CM | POA: Diagnosis not present

## 2017-03-09 DIAGNOSIS — Z886 Allergy status to analgesic agent status: Secondary | ICD-10-CM | POA: Diagnosis not present

## 2017-03-09 DIAGNOSIS — M1711 Unilateral primary osteoarthritis, right knee: Secondary | ICD-10-CM | POA: Diagnosis not present

## 2017-03-09 DIAGNOSIS — S83241A Other tear of medial meniscus, current injury, right knee, initial encounter: Secondary | ICD-10-CM | POA: Diagnosis not present

## 2017-03-09 DIAGNOSIS — F17210 Nicotine dependence, cigarettes, uncomplicated: Secondary | ICD-10-CM | POA: Diagnosis not present

## 2017-03-09 DIAGNOSIS — Z882 Allergy status to sulfonamides status: Secondary | ICD-10-CM | POA: Diagnosis not present

## 2017-03-09 DIAGNOSIS — F419 Anxiety disorder, unspecified: Secondary | ICD-10-CM | POA: Diagnosis not present

## 2017-03-09 DIAGNOSIS — F319 Bipolar disorder, unspecified: Secondary | ICD-10-CM | POA: Diagnosis not present

## 2017-03-23 DIAGNOSIS — J449 Chronic obstructive pulmonary disease, unspecified: Secondary | ICD-10-CM | POA: Diagnosis not present

## 2017-03-23 DIAGNOSIS — Z4789 Encounter for other orthopedic aftercare: Secondary | ICD-10-CM | POA: Diagnosis not present

## 2017-03-23 DIAGNOSIS — I69354 Hemiplegia and hemiparesis following cerebral infarction affecting left non-dominant side: Secondary | ICD-10-CM | POA: Diagnosis not present

## 2017-03-23 DIAGNOSIS — I10 Essential (primary) hypertension: Secondary | ICD-10-CM | POA: Diagnosis not present

## 2017-03-23 DIAGNOSIS — F17218 Nicotine dependence, cigarettes, with other nicotine-induced disorders: Secondary | ICD-10-CM | POA: Diagnosis not present

## 2017-03-23 DIAGNOSIS — E785 Hyperlipidemia, unspecified: Secondary | ICD-10-CM | POA: Diagnosis not present

## 2017-03-27 DIAGNOSIS — I10 Essential (primary) hypertension: Secondary | ICD-10-CM | POA: Diagnosis not present

## 2017-03-27 DIAGNOSIS — I69354 Hemiplegia and hemiparesis following cerebral infarction affecting left non-dominant side: Secondary | ICD-10-CM | POA: Diagnosis not present

## 2017-03-27 DIAGNOSIS — Z4789 Encounter for other orthopedic aftercare: Secondary | ICD-10-CM | POA: Diagnosis not present

## 2017-03-27 DIAGNOSIS — E785 Hyperlipidemia, unspecified: Secondary | ICD-10-CM | POA: Diagnosis not present

## 2017-03-27 DIAGNOSIS — J449 Chronic obstructive pulmonary disease, unspecified: Secondary | ICD-10-CM | POA: Diagnosis not present

## 2017-03-27 DIAGNOSIS — F17218 Nicotine dependence, cigarettes, with other nicotine-induced disorders: Secondary | ICD-10-CM | POA: Diagnosis not present

## 2017-03-29 DIAGNOSIS — J449 Chronic obstructive pulmonary disease, unspecified: Secondary | ICD-10-CM | POA: Diagnosis not present

## 2017-03-29 DIAGNOSIS — I69354 Hemiplegia and hemiparesis following cerebral infarction affecting left non-dominant side: Secondary | ICD-10-CM | POA: Diagnosis not present

## 2017-03-29 DIAGNOSIS — I10 Essential (primary) hypertension: Secondary | ICD-10-CM | POA: Diagnosis not present

## 2017-03-29 DIAGNOSIS — Z4789 Encounter for other orthopedic aftercare: Secondary | ICD-10-CM | POA: Diagnosis not present

## 2017-03-29 DIAGNOSIS — E785 Hyperlipidemia, unspecified: Secondary | ICD-10-CM | POA: Diagnosis not present

## 2017-03-29 DIAGNOSIS — F17218 Nicotine dependence, cigarettes, with other nicotine-induced disorders: Secondary | ICD-10-CM | POA: Diagnosis not present

## 2017-03-30 DIAGNOSIS — F17218 Nicotine dependence, cigarettes, with other nicotine-induced disorders: Secondary | ICD-10-CM | POA: Diagnosis not present

## 2017-03-30 DIAGNOSIS — I10 Essential (primary) hypertension: Secondary | ICD-10-CM | POA: Diagnosis not present

## 2017-03-30 DIAGNOSIS — E785 Hyperlipidemia, unspecified: Secondary | ICD-10-CM | POA: Diagnosis not present

## 2017-03-30 DIAGNOSIS — J449 Chronic obstructive pulmonary disease, unspecified: Secondary | ICD-10-CM | POA: Diagnosis not present

## 2017-03-30 DIAGNOSIS — I69354 Hemiplegia and hemiparesis following cerebral infarction affecting left non-dominant side: Secondary | ICD-10-CM | POA: Diagnosis not present

## 2017-03-30 DIAGNOSIS — Z4789 Encounter for other orthopedic aftercare: Secondary | ICD-10-CM | POA: Diagnosis not present

## 2017-04-03 DIAGNOSIS — I10 Essential (primary) hypertension: Secondary | ICD-10-CM | POA: Diagnosis not present

## 2017-04-03 DIAGNOSIS — J449 Chronic obstructive pulmonary disease, unspecified: Secondary | ICD-10-CM | POA: Diagnosis not present

## 2017-04-03 DIAGNOSIS — E785 Hyperlipidemia, unspecified: Secondary | ICD-10-CM | POA: Diagnosis not present

## 2017-04-03 DIAGNOSIS — I69354 Hemiplegia and hemiparesis following cerebral infarction affecting left non-dominant side: Secondary | ICD-10-CM | POA: Diagnosis not present

## 2017-04-03 DIAGNOSIS — Z4789 Encounter for other orthopedic aftercare: Secondary | ICD-10-CM | POA: Diagnosis not present

## 2017-04-03 DIAGNOSIS — F17218 Nicotine dependence, cigarettes, with other nicotine-induced disorders: Secondary | ICD-10-CM | POA: Diagnosis not present

## 2017-04-04 DIAGNOSIS — J449 Chronic obstructive pulmonary disease, unspecified: Secondary | ICD-10-CM | POA: Diagnosis not present

## 2017-04-04 DIAGNOSIS — E785 Hyperlipidemia, unspecified: Secondary | ICD-10-CM | POA: Diagnosis not present

## 2017-04-04 DIAGNOSIS — F17218 Nicotine dependence, cigarettes, with other nicotine-induced disorders: Secondary | ICD-10-CM | POA: Diagnosis not present

## 2017-04-04 DIAGNOSIS — I69354 Hemiplegia and hemiparesis following cerebral infarction affecting left non-dominant side: Secondary | ICD-10-CM | POA: Diagnosis not present

## 2017-04-04 DIAGNOSIS — I10 Essential (primary) hypertension: Secondary | ICD-10-CM | POA: Diagnosis not present

## 2017-04-04 DIAGNOSIS — Z4789 Encounter for other orthopedic aftercare: Secondary | ICD-10-CM | POA: Diagnosis not present

## 2017-04-06 DIAGNOSIS — K5732 Diverticulitis of large intestine without perforation or abscess without bleeding: Secondary | ICD-10-CM | POA: Diagnosis not present

## 2017-04-06 DIAGNOSIS — I6789 Other cerebrovascular disease: Secondary | ICD-10-CM | POA: Diagnosis not present

## 2017-04-06 DIAGNOSIS — M545 Low back pain: Secondary | ICD-10-CM | POA: Diagnosis not present

## 2017-04-06 DIAGNOSIS — I10 Essential (primary) hypertension: Secondary | ICD-10-CM | POA: Diagnosis not present

## 2017-04-09 DIAGNOSIS — Z8673 Personal history of transient ischemic attack (TIA), and cerebral infarction without residual deficits: Secondary | ICD-10-CM | POA: Diagnosis not present

## 2017-04-09 DIAGNOSIS — R1031 Right lower quadrant pain: Secondary | ICD-10-CM | POA: Diagnosis not present

## 2017-04-09 DIAGNOSIS — J449 Chronic obstructive pulmonary disease, unspecified: Secondary | ICD-10-CM | POA: Diagnosis not present

## 2017-04-09 DIAGNOSIS — Z79899 Other long term (current) drug therapy: Secondary | ICD-10-CM | POA: Diagnosis not present

## 2017-04-09 DIAGNOSIS — R1110 Vomiting, unspecified: Secondary | ICD-10-CM | POA: Diagnosis not present

## 2017-04-09 DIAGNOSIS — R404 Transient alteration of awareness: Secondary | ICD-10-CM | POA: Diagnosis not present

## 2017-04-09 DIAGNOSIS — R531 Weakness: Secondary | ICD-10-CM | POA: Diagnosis not present

## 2017-04-09 DIAGNOSIS — K5900 Constipation, unspecified: Secondary | ICD-10-CM | POA: Diagnosis not present

## 2017-04-09 DIAGNOSIS — Z7982 Long term (current) use of aspirin: Secondary | ICD-10-CM | POA: Diagnosis not present

## 2017-04-09 DIAGNOSIS — F17200 Nicotine dependence, unspecified, uncomplicated: Secondary | ICD-10-CM | POA: Diagnosis not present

## 2017-04-09 DIAGNOSIS — F419 Anxiety disorder, unspecified: Secondary | ICD-10-CM | POA: Diagnosis not present

## 2017-04-09 DIAGNOSIS — Z7902 Long term (current) use of antithrombotics/antiplatelets: Secondary | ICD-10-CM | POA: Diagnosis not present

## 2017-04-09 DIAGNOSIS — R1111 Vomiting without nausea: Secondary | ICD-10-CM | POA: Diagnosis not present

## 2017-04-09 DIAGNOSIS — F319 Bipolar disorder, unspecified: Secondary | ICD-10-CM | POA: Diagnosis not present

## 2017-04-11 ENCOUNTER — Ambulatory Visit: Payer: Medicare Other | Admitting: Neurology

## 2017-04-12 ENCOUNTER — Encounter: Payer: Self-pay | Admitting: Neurology

## 2017-04-12 DIAGNOSIS — I69354 Hemiplegia and hemiparesis following cerebral infarction affecting left non-dominant side: Secondary | ICD-10-CM | POA: Diagnosis not present

## 2017-04-12 DIAGNOSIS — J449 Chronic obstructive pulmonary disease, unspecified: Secondary | ICD-10-CM | POA: Diagnosis not present

## 2017-04-12 DIAGNOSIS — E785 Hyperlipidemia, unspecified: Secondary | ICD-10-CM | POA: Diagnosis not present

## 2017-04-12 DIAGNOSIS — I10 Essential (primary) hypertension: Secondary | ICD-10-CM | POA: Diagnosis not present

## 2017-04-12 DIAGNOSIS — F17218 Nicotine dependence, cigarettes, with other nicotine-induced disorders: Secondary | ICD-10-CM | POA: Diagnosis not present

## 2017-04-12 DIAGNOSIS — Z4789 Encounter for other orthopedic aftercare: Secondary | ICD-10-CM | POA: Diagnosis not present

## 2017-04-13 DIAGNOSIS — E785 Hyperlipidemia, unspecified: Secondary | ICD-10-CM | POA: Diagnosis not present

## 2017-04-13 DIAGNOSIS — J449 Chronic obstructive pulmonary disease, unspecified: Secondary | ICD-10-CM | POA: Diagnosis not present

## 2017-04-13 DIAGNOSIS — F17218 Nicotine dependence, cigarettes, with other nicotine-induced disorders: Secondary | ICD-10-CM | POA: Diagnosis not present

## 2017-04-13 DIAGNOSIS — I10 Essential (primary) hypertension: Secondary | ICD-10-CM | POA: Diagnosis not present

## 2017-04-13 DIAGNOSIS — Z4789 Encounter for other orthopedic aftercare: Secondary | ICD-10-CM | POA: Diagnosis not present

## 2017-04-13 DIAGNOSIS — I69354 Hemiplegia and hemiparesis following cerebral infarction affecting left non-dominant side: Secondary | ICD-10-CM | POA: Diagnosis not present

## 2017-04-17 DIAGNOSIS — E785 Hyperlipidemia, unspecified: Secondary | ICD-10-CM | POA: Diagnosis not present

## 2017-04-17 DIAGNOSIS — F17218 Nicotine dependence, cigarettes, with other nicotine-induced disorders: Secondary | ICD-10-CM | POA: Diagnosis not present

## 2017-04-17 DIAGNOSIS — I69354 Hemiplegia and hemiparesis following cerebral infarction affecting left non-dominant side: Secondary | ICD-10-CM | POA: Diagnosis not present

## 2017-04-17 DIAGNOSIS — I10 Essential (primary) hypertension: Secondary | ICD-10-CM | POA: Diagnosis not present

## 2017-04-17 DIAGNOSIS — J449 Chronic obstructive pulmonary disease, unspecified: Secondary | ICD-10-CM | POA: Diagnosis not present

## 2017-04-17 DIAGNOSIS — Z4789 Encounter for other orthopedic aftercare: Secondary | ICD-10-CM | POA: Diagnosis not present

## 2017-04-18 DIAGNOSIS — E785 Hyperlipidemia, unspecified: Secondary | ICD-10-CM | POA: Diagnosis not present

## 2017-04-18 DIAGNOSIS — Z4789 Encounter for other orthopedic aftercare: Secondary | ICD-10-CM | POA: Diagnosis not present

## 2017-04-18 DIAGNOSIS — I69354 Hemiplegia and hemiparesis following cerebral infarction affecting left non-dominant side: Secondary | ICD-10-CM | POA: Diagnosis not present

## 2017-04-18 DIAGNOSIS — I10 Essential (primary) hypertension: Secondary | ICD-10-CM | POA: Diagnosis not present

## 2017-04-18 DIAGNOSIS — J449 Chronic obstructive pulmonary disease, unspecified: Secondary | ICD-10-CM | POA: Diagnosis not present

## 2017-04-18 DIAGNOSIS — F17218 Nicotine dependence, cigarettes, with other nicotine-induced disorders: Secondary | ICD-10-CM | POA: Diagnosis not present

## 2017-04-19 DIAGNOSIS — I10 Essential (primary) hypertension: Secondary | ICD-10-CM | POA: Diagnosis not present

## 2017-04-19 DIAGNOSIS — Z4789 Encounter for other orthopedic aftercare: Secondary | ICD-10-CM | POA: Diagnosis not present

## 2017-04-19 DIAGNOSIS — I69354 Hemiplegia and hemiparesis following cerebral infarction affecting left non-dominant side: Secondary | ICD-10-CM | POA: Diagnosis not present

## 2017-04-19 DIAGNOSIS — E785 Hyperlipidemia, unspecified: Secondary | ICD-10-CM | POA: Diagnosis not present

## 2017-04-19 DIAGNOSIS — J449 Chronic obstructive pulmonary disease, unspecified: Secondary | ICD-10-CM | POA: Diagnosis not present

## 2017-04-19 DIAGNOSIS — F17218 Nicotine dependence, cigarettes, with other nicotine-induced disorders: Secondary | ICD-10-CM | POA: Diagnosis not present

## 2017-04-24 DIAGNOSIS — J449 Chronic obstructive pulmonary disease, unspecified: Secondary | ICD-10-CM | POA: Diagnosis not present

## 2017-04-24 DIAGNOSIS — Z4789 Encounter for other orthopedic aftercare: Secondary | ICD-10-CM | POA: Diagnosis not present

## 2017-04-24 DIAGNOSIS — F17218 Nicotine dependence, cigarettes, with other nicotine-induced disorders: Secondary | ICD-10-CM | POA: Diagnosis not present

## 2017-04-24 DIAGNOSIS — I69354 Hemiplegia and hemiparesis following cerebral infarction affecting left non-dominant side: Secondary | ICD-10-CM | POA: Diagnosis not present

## 2017-04-24 DIAGNOSIS — E785 Hyperlipidemia, unspecified: Secondary | ICD-10-CM | POA: Diagnosis not present

## 2017-04-24 DIAGNOSIS — I10 Essential (primary) hypertension: Secondary | ICD-10-CM | POA: Diagnosis not present

## 2017-04-25 DIAGNOSIS — F17218 Nicotine dependence, cigarettes, with other nicotine-induced disorders: Secondary | ICD-10-CM | POA: Diagnosis not present

## 2017-04-25 DIAGNOSIS — J449 Chronic obstructive pulmonary disease, unspecified: Secondary | ICD-10-CM | POA: Diagnosis not present

## 2017-04-25 DIAGNOSIS — E785 Hyperlipidemia, unspecified: Secondary | ICD-10-CM | POA: Diagnosis not present

## 2017-04-25 DIAGNOSIS — I69354 Hemiplegia and hemiparesis following cerebral infarction affecting left non-dominant side: Secondary | ICD-10-CM | POA: Diagnosis not present

## 2017-04-25 DIAGNOSIS — Z4789 Encounter for other orthopedic aftercare: Secondary | ICD-10-CM | POA: Diagnosis not present

## 2017-04-25 DIAGNOSIS — I10 Essential (primary) hypertension: Secondary | ICD-10-CM | POA: Diagnosis not present

## 2017-04-30 DIAGNOSIS — E785 Hyperlipidemia, unspecified: Secondary | ICD-10-CM | POA: Diagnosis not present

## 2017-04-30 DIAGNOSIS — F17218 Nicotine dependence, cigarettes, with other nicotine-induced disorders: Secondary | ICD-10-CM | POA: Diagnosis not present

## 2017-04-30 DIAGNOSIS — I10 Essential (primary) hypertension: Secondary | ICD-10-CM | POA: Diagnosis not present

## 2017-04-30 DIAGNOSIS — J449 Chronic obstructive pulmonary disease, unspecified: Secondary | ICD-10-CM | POA: Diagnosis not present

## 2017-04-30 DIAGNOSIS — I69354 Hemiplegia and hemiparesis following cerebral infarction affecting left non-dominant side: Secondary | ICD-10-CM | POA: Diagnosis not present

## 2017-04-30 DIAGNOSIS — Z4789 Encounter for other orthopedic aftercare: Secondary | ICD-10-CM | POA: Diagnosis not present

## 2017-05-02 DIAGNOSIS — I10 Essential (primary) hypertension: Secondary | ICD-10-CM | POA: Diagnosis not present

## 2017-05-02 DIAGNOSIS — E785 Hyperlipidemia, unspecified: Secondary | ICD-10-CM | POA: Diagnosis not present

## 2017-05-02 DIAGNOSIS — F17218 Nicotine dependence, cigarettes, with other nicotine-induced disorders: Secondary | ICD-10-CM | POA: Diagnosis not present

## 2017-05-02 DIAGNOSIS — I69354 Hemiplegia and hemiparesis following cerebral infarction affecting left non-dominant side: Secondary | ICD-10-CM | POA: Diagnosis not present

## 2017-05-02 DIAGNOSIS — Z4789 Encounter for other orthopedic aftercare: Secondary | ICD-10-CM | POA: Diagnosis not present

## 2017-05-02 DIAGNOSIS — J449 Chronic obstructive pulmonary disease, unspecified: Secondary | ICD-10-CM | POA: Diagnosis not present

## 2017-05-04 DIAGNOSIS — Z4789 Encounter for other orthopedic aftercare: Secondary | ICD-10-CM | POA: Diagnosis not present

## 2017-05-04 DIAGNOSIS — I10 Essential (primary) hypertension: Secondary | ICD-10-CM | POA: Diagnosis not present

## 2017-05-04 DIAGNOSIS — I69354 Hemiplegia and hemiparesis following cerebral infarction affecting left non-dominant side: Secondary | ICD-10-CM | POA: Diagnosis not present

## 2017-05-04 DIAGNOSIS — F17218 Nicotine dependence, cigarettes, with other nicotine-induced disorders: Secondary | ICD-10-CM | POA: Diagnosis not present

## 2017-05-04 DIAGNOSIS — E785 Hyperlipidemia, unspecified: Secondary | ICD-10-CM | POA: Diagnosis not present

## 2017-05-04 DIAGNOSIS — J449 Chronic obstructive pulmonary disease, unspecified: Secondary | ICD-10-CM | POA: Diagnosis not present

## 2017-05-09 DIAGNOSIS — F17200 Nicotine dependence, unspecified, uncomplicated: Secondary | ICD-10-CM | POA: Diagnosis not present

## 2017-05-09 DIAGNOSIS — F17218 Nicotine dependence, cigarettes, with other nicotine-induced disorders: Secondary | ICD-10-CM | POA: Diagnosis not present

## 2017-05-09 DIAGNOSIS — F319 Bipolar disorder, unspecified: Secondary | ICD-10-CM | POA: Diagnosis not present

## 2017-05-09 DIAGNOSIS — J449 Chronic obstructive pulmonary disease, unspecified: Secondary | ICD-10-CM | POA: Diagnosis not present

## 2017-05-09 DIAGNOSIS — Z8541 Personal history of malignant neoplasm of cervix uteri: Secondary | ICD-10-CM | POA: Diagnosis not present

## 2017-05-09 DIAGNOSIS — E871 Hypo-osmolality and hyponatremia: Secondary | ICD-10-CM | POA: Diagnosis not present

## 2017-05-09 DIAGNOSIS — Z7902 Long term (current) use of antithrombotics/antiplatelets: Secondary | ICD-10-CM | POA: Diagnosis not present

## 2017-05-09 DIAGNOSIS — K8020 Calculus of gallbladder without cholecystitis without obstruction: Secondary | ICD-10-CM | POA: Diagnosis not present

## 2017-05-09 DIAGNOSIS — K6289 Other specified diseases of anus and rectum: Secondary | ICD-10-CM | POA: Diagnosis not present

## 2017-05-09 DIAGNOSIS — Z79899 Other long term (current) drug therapy: Secondary | ICD-10-CM | POA: Diagnosis not present

## 2017-05-09 DIAGNOSIS — R4182 Altered mental status, unspecified: Secondary | ICD-10-CM | POA: Diagnosis not present

## 2017-05-09 DIAGNOSIS — Z7982 Long term (current) use of aspirin: Secondary | ICD-10-CM | POA: Diagnosis not present

## 2017-05-09 DIAGNOSIS — E876 Hypokalemia: Secondary | ICD-10-CM | POA: Diagnosis not present

## 2017-05-09 DIAGNOSIS — Z4789 Encounter for other orthopedic aftercare: Secondary | ICD-10-CM | POA: Diagnosis not present

## 2017-05-09 DIAGNOSIS — Z8673 Personal history of transient ischemic attack (TIA), and cerebral infarction without residual deficits: Secondary | ICD-10-CM | POA: Diagnosis not present

## 2017-05-09 DIAGNOSIS — I69354 Hemiplegia and hemiparesis following cerebral infarction affecting left non-dominant side: Secondary | ICD-10-CM | POA: Diagnosis not present

## 2017-05-09 DIAGNOSIS — E785 Hyperlipidemia, unspecified: Secondary | ICD-10-CM | POA: Diagnosis not present

## 2017-05-09 DIAGNOSIS — F419 Anxiety disorder, unspecified: Secondary | ICD-10-CM | POA: Diagnosis not present

## 2017-05-09 DIAGNOSIS — I10 Essential (primary) hypertension: Secondary | ICD-10-CM | POA: Diagnosis not present

## 2017-05-09 DIAGNOSIS — R102 Pelvic and perineal pain: Secondary | ICD-10-CM | POA: Diagnosis not present

## 2017-05-09 DIAGNOSIS — G9389 Other specified disorders of brain: Secondary | ICD-10-CM | POA: Diagnosis not present

## 2017-05-17 DIAGNOSIS — J449 Chronic obstructive pulmonary disease, unspecified: Secondary | ICD-10-CM | POA: Diagnosis not present

## 2017-05-17 DIAGNOSIS — I69354 Hemiplegia and hemiparesis following cerebral infarction affecting left non-dominant side: Secondary | ICD-10-CM | POA: Diagnosis not present

## 2017-05-17 DIAGNOSIS — Z4789 Encounter for other orthopedic aftercare: Secondary | ICD-10-CM | POA: Diagnosis not present

## 2017-05-17 DIAGNOSIS — I10 Essential (primary) hypertension: Secondary | ICD-10-CM | POA: Diagnosis not present

## 2017-05-17 DIAGNOSIS — F17218 Nicotine dependence, cigarettes, with other nicotine-induced disorders: Secondary | ICD-10-CM | POA: Diagnosis not present

## 2017-05-17 DIAGNOSIS — E785 Hyperlipidemia, unspecified: Secondary | ICD-10-CM | POA: Diagnosis not present

## 2017-05-18 DIAGNOSIS — Z4789 Encounter for other orthopedic aftercare: Secondary | ICD-10-CM | POA: Diagnosis not present

## 2017-05-18 DIAGNOSIS — J449 Chronic obstructive pulmonary disease, unspecified: Secondary | ICD-10-CM | POA: Diagnosis not present

## 2017-05-18 DIAGNOSIS — F17218 Nicotine dependence, cigarettes, with other nicotine-induced disorders: Secondary | ICD-10-CM | POA: Diagnosis not present

## 2017-05-18 DIAGNOSIS — E785 Hyperlipidemia, unspecified: Secondary | ICD-10-CM | POA: Diagnosis not present

## 2017-05-18 DIAGNOSIS — I10 Essential (primary) hypertension: Secondary | ICD-10-CM | POA: Diagnosis not present

## 2017-05-18 DIAGNOSIS — I69354 Hemiplegia and hemiparesis following cerebral infarction affecting left non-dominant side: Secondary | ICD-10-CM | POA: Diagnosis not present

## 2017-05-21 DIAGNOSIS — J449 Chronic obstructive pulmonary disease, unspecified: Secondary | ICD-10-CM | POA: Diagnosis not present

## 2017-05-21 DIAGNOSIS — E785 Hyperlipidemia, unspecified: Secondary | ICD-10-CM | POA: Diagnosis not present

## 2017-05-21 DIAGNOSIS — Z4789 Encounter for other orthopedic aftercare: Secondary | ICD-10-CM | POA: Diagnosis not present

## 2017-05-21 DIAGNOSIS — I10 Essential (primary) hypertension: Secondary | ICD-10-CM | POA: Diagnosis not present

## 2017-05-21 DIAGNOSIS — I69354 Hemiplegia and hemiparesis following cerebral infarction affecting left non-dominant side: Secondary | ICD-10-CM | POA: Diagnosis not present

## 2017-05-21 DIAGNOSIS — F17218 Nicotine dependence, cigarettes, with other nicotine-induced disorders: Secondary | ICD-10-CM | POA: Diagnosis not present

## 2017-05-22 DIAGNOSIS — Z7902 Long term (current) use of antithrombotics/antiplatelets: Secondary | ICD-10-CM | POA: Diagnosis not present

## 2017-05-22 DIAGNOSIS — I10 Essential (primary) hypertension: Secondary | ICD-10-CM | POA: Diagnosis not present

## 2017-05-22 DIAGNOSIS — Z8679 Personal history of other diseases of the circulatory system: Secondary | ICD-10-CM | POA: Diagnosis not present

## 2017-05-22 DIAGNOSIS — J449 Chronic obstructive pulmonary disease, unspecified: Secondary | ICD-10-CM | POA: Diagnosis not present

## 2017-05-22 DIAGNOSIS — F17218 Nicotine dependence, cigarettes, with other nicotine-induced disorders: Secondary | ICD-10-CM | POA: Diagnosis not present

## 2017-05-22 DIAGNOSIS — I69354 Hemiplegia and hemiparesis following cerebral infarction affecting left non-dominant side: Secondary | ICD-10-CM | POA: Diagnosis not present

## 2017-05-24 DIAGNOSIS — F17218 Nicotine dependence, cigarettes, with other nicotine-induced disorders: Secondary | ICD-10-CM | POA: Diagnosis not present

## 2017-05-24 DIAGNOSIS — I10 Essential (primary) hypertension: Secondary | ICD-10-CM | POA: Diagnosis not present

## 2017-05-24 DIAGNOSIS — Z7902 Long term (current) use of antithrombotics/antiplatelets: Secondary | ICD-10-CM | POA: Diagnosis not present

## 2017-05-24 DIAGNOSIS — J449 Chronic obstructive pulmonary disease, unspecified: Secondary | ICD-10-CM | POA: Diagnosis not present

## 2017-05-24 DIAGNOSIS — Z8679 Personal history of other diseases of the circulatory system: Secondary | ICD-10-CM | POA: Diagnosis not present

## 2017-05-24 DIAGNOSIS — I69354 Hemiplegia and hemiparesis following cerebral infarction affecting left non-dominant side: Secondary | ICD-10-CM | POA: Diagnosis not present

## 2017-05-30 DIAGNOSIS — R1111 Vomiting without nausea: Secondary | ICD-10-CM | POA: Diagnosis not present

## 2017-05-30 DIAGNOSIS — F17218 Nicotine dependence, cigarettes, with other nicotine-induced disorders: Secondary | ICD-10-CM | POA: Diagnosis not present

## 2017-05-30 DIAGNOSIS — K922 Gastrointestinal hemorrhage, unspecified: Secondary | ICD-10-CM | POA: Diagnosis not present

## 2017-05-30 DIAGNOSIS — R1110 Vomiting, unspecified: Secondary | ICD-10-CM | POA: Diagnosis not present

## 2017-05-30 DIAGNOSIS — Z882 Allergy status to sulfonamides status: Secondary | ICD-10-CM | POA: Diagnosis not present

## 2017-05-30 DIAGNOSIS — K921 Melena: Secondary | ICD-10-CM | POA: Diagnosis not present

## 2017-05-30 DIAGNOSIS — M810 Age-related osteoporosis without current pathological fracture: Secondary | ICD-10-CM | POA: Diagnosis not present

## 2017-05-30 DIAGNOSIS — D62 Acute posthemorrhagic anemia: Secondary | ICD-10-CM | POA: Diagnosis not present

## 2017-05-30 DIAGNOSIS — E871 Hypo-osmolality and hyponatremia: Secondary | ICD-10-CM | POA: Diagnosis not present

## 2017-05-30 DIAGNOSIS — I69354 Hemiplegia and hemiparesis following cerebral infarction affecting left non-dominant side: Secondary | ICD-10-CM | POA: Diagnosis not present

## 2017-05-30 DIAGNOSIS — G629 Polyneuropathy, unspecified: Secondary | ICD-10-CM | POA: Diagnosis present

## 2017-05-30 DIAGNOSIS — K250 Acute gastric ulcer with hemorrhage: Secondary | ICD-10-CM | POA: Diagnosis present

## 2017-05-30 DIAGNOSIS — F419 Anxiety disorder, unspecified: Secondary | ICD-10-CM | POA: Diagnosis present

## 2017-05-30 DIAGNOSIS — Z8673 Personal history of transient ischemic attack (TIA), and cerebral infarction without residual deficits: Secondary | ICD-10-CM | POA: Diagnosis not present

## 2017-05-30 DIAGNOSIS — Z8541 Personal history of malignant neoplasm of cervix uteri: Secondary | ICD-10-CM | POA: Diagnosis not present

## 2017-05-30 DIAGNOSIS — Z79899 Other long term (current) drug therapy: Secondary | ICD-10-CM | POA: Diagnosis not present

## 2017-05-30 DIAGNOSIS — J449 Chronic obstructive pulmonary disease, unspecified: Secondary | ICD-10-CM | POA: Diagnosis present

## 2017-05-30 DIAGNOSIS — D500 Iron deficiency anemia secondary to blood loss (chronic): Secondary | ICD-10-CM | POA: Diagnosis not present

## 2017-05-30 DIAGNOSIS — Z8679 Personal history of other diseases of the circulatory system: Secondary | ICD-10-CM | POA: Diagnosis not present

## 2017-05-30 DIAGNOSIS — E7800 Pure hypercholesterolemia, unspecified: Secondary | ICD-10-CM | POA: Diagnosis present

## 2017-05-30 DIAGNOSIS — E785 Hyperlipidemia, unspecified: Secondary | ICD-10-CM | POA: Diagnosis present

## 2017-05-30 DIAGNOSIS — I10 Essential (primary) hypertension: Secondary | ICD-10-CM | POA: Diagnosis present

## 2017-05-30 DIAGNOSIS — E876 Hypokalemia: Secondary | ICD-10-CM | POA: Diagnosis not present

## 2017-05-30 DIAGNOSIS — Z7902 Long term (current) use of antithrombotics/antiplatelets: Secondary | ICD-10-CM | POA: Diagnosis not present

## 2017-05-30 DIAGNOSIS — R52 Pain, unspecified: Secondary | ICD-10-CM | POA: Diagnosis not present

## 2017-05-30 DIAGNOSIS — R404 Transient alteration of awareness: Secondary | ICD-10-CM | POA: Diagnosis not present

## 2017-05-30 DIAGNOSIS — F319 Bipolar disorder, unspecified: Secondary | ICD-10-CM | POA: Diagnosis present

## 2017-05-30 DIAGNOSIS — Z7982 Long term (current) use of aspirin: Secondary | ICD-10-CM | POA: Diagnosis not present

## 2017-05-30 DIAGNOSIS — R531 Weakness: Secondary | ICD-10-CM | POA: Diagnosis not present

## 2017-05-30 DIAGNOSIS — Z886 Allergy status to analgesic agent status: Secondary | ICD-10-CM | POA: Diagnosis not present

## 2017-05-30 DIAGNOSIS — F17200 Nicotine dependence, unspecified, uncomplicated: Secondary | ICD-10-CM | POA: Diagnosis present

## 2017-06-04 DIAGNOSIS — Z7902 Long term (current) use of antithrombotics/antiplatelets: Secondary | ICD-10-CM | POA: Diagnosis not present

## 2017-06-04 DIAGNOSIS — Z8679 Personal history of other diseases of the circulatory system: Secondary | ICD-10-CM | POA: Diagnosis not present

## 2017-06-04 DIAGNOSIS — F17218 Nicotine dependence, cigarettes, with other nicotine-induced disorders: Secondary | ICD-10-CM | POA: Diagnosis not present

## 2017-06-04 DIAGNOSIS — J449 Chronic obstructive pulmonary disease, unspecified: Secondary | ICD-10-CM | POA: Diagnosis not present

## 2017-06-04 DIAGNOSIS — I10 Essential (primary) hypertension: Secondary | ICD-10-CM | POA: Diagnosis not present

## 2017-06-04 DIAGNOSIS — I69354 Hemiplegia and hemiparesis following cerebral infarction affecting left non-dominant side: Secondary | ICD-10-CM | POA: Diagnosis not present

## 2017-06-05 ENCOUNTER — Ambulatory Visit: Payer: Medicare Other | Admitting: Neurology

## 2017-06-08 DIAGNOSIS — I69354 Hemiplegia and hemiparesis following cerebral infarction affecting left non-dominant side: Secondary | ICD-10-CM | POA: Diagnosis not present

## 2017-06-08 DIAGNOSIS — Z8679 Personal history of other diseases of the circulatory system: Secondary | ICD-10-CM | POA: Diagnosis not present

## 2017-06-08 DIAGNOSIS — F17218 Nicotine dependence, cigarettes, with other nicotine-induced disorders: Secondary | ICD-10-CM | POA: Diagnosis not present

## 2017-06-08 DIAGNOSIS — J449 Chronic obstructive pulmonary disease, unspecified: Secondary | ICD-10-CM | POA: Diagnosis not present

## 2017-06-08 DIAGNOSIS — Z7902 Long term (current) use of antithrombotics/antiplatelets: Secondary | ICD-10-CM | POA: Diagnosis not present

## 2017-06-08 DIAGNOSIS — I10 Essential (primary) hypertension: Secondary | ICD-10-CM | POA: Diagnosis not present

## 2017-06-13 DIAGNOSIS — K250 Acute gastric ulcer with hemorrhage: Secondary | ICD-10-CM | POA: Diagnosis not present

## 2017-06-13 DIAGNOSIS — Z7902 Long term (current) use of antithrombotics/antiplatelets: Secondary | ICD-10-CM | POA: Diagnosis not present

## 2017-06-13 DIAGNOSIS — F17218 Nicotine dependence, cigarettes, with other nicotine-induced disorders: Secondary | ICD-10-CM | POA: Diagnosis not present

## 2017-06-13 DIAGNOSIS — J449 Chronic obstructive pulmonary disease, unspecified: Secondary | ICD-10-CM | POA: Diagnosis not present

## 2017-06-13 DIAGNOSIS — I10 Essential (primary) hypertension: Secondary | ICD-10-CM | POA: Diagnosis not present

## 2017-06-13 DIAGNOSIS — I69354 Hemiplegia and hemiparesis following cerebral infarction affecting left non-dominant side: Secondary | ICD-10-CM | POA: Diagnosis not present

## 2017-06-13 DIAGNOSIS — Z8679 Personal history of other diseases of the circulatory system: Secondary | ICD-10-CM | POA: Diagnosis not present

## 2017-06-15 DIAGNOSIS — Z8679 Personal history of other diseases of the circulatory system: Secondary | ICD-10-CM | POA: Diagnosis not present

## 2017-06-15 DIAGNOSIS — I10 Essential (primary) hypertension: Secondary | ICD-10-CM | POA: Diagnosis not present

## 2017-06-15 DIAGNOSIS — J449 Chronic obstructive pulmonary disease, unspecified: Secondary | ICD-10-CM | POA: Diagnosis not present

## 2017-06-15 DIAGNOSIS — I69354 Hemiplegia and hemiparesis following cerebral infarction affecting left non-dominant side: Secondary | ICD-10-CM | POA: Diagnosis not present

## 2017-06-15 DIAGNOSIS — Z7902 Long term (current) use of antithrombotics/antiplatelets: Secondary | ICD-10-CM | POA: Diagnosis not present

## 2017-06-15 DIAGNOSIS — F17218 Nicotine dependence, cigarettes, with other nicotine-induced disorders: Secondary | ICD-10-CM | POA: Diagnosis not present

## 2017-06-18 DIAGNOSIS — F17218 Nicotine dependence, cigarettes, with other nicotine-induced disorders: Secondary | ICD-10-CM | POA: Diagnosis not present

## 2017-06-18 DIAGNOSIS — I69354 Hemiplegia and hemiparesis following cerebral infarction affecting left non-dominant side: Secondary | ICD-10-CM | POA: Diagnosis not present

## 2017-06-18 DIAGNOSIS — J449 Chronic obstructive pulmonary disease, unspecified: Secondary | ICD-10-CM | POA: Diagnosis not present

## 2017-06-18 DIAGNOSIS — I10 Essential (primary) hypertension: Secondary | ICD-10-CM | POA: Diagnosis not present

## 2017-06-18 DIAGNOSIS — Z7902 Long term (current) use of antithrombotics/antiplatelets: Secondary | ICD-10-CM | POA: Diagnosis not present

## 2017-06-18 DIAGNOSIS — Z8679 Personal history of other diseases of the circulatory system: Secondary | ICD-10-CM | POA: Diagnosis not present

## 2017-06-20 DIAGNOSIS — F17218 Nicotine dependence, cigarettes, with other nicotine-induced disorders: Secondary | ICD-10-CM | POA: Diagnosis not present

## 2017-06-20 DIAGNOSIS — Z8679 Personal history of other diseases of the circulatory system: Secondary | ICD-10-CM | POA: Diagnosis not present

## 2017-06-20 DIAGNOSIS — I10 Essential (primary) hypertension: Secondary | ICD-10-CM | POA: Diagnosis not present

## 2017-06-20 DIAGNOSIS — I69354 Hemiplegia and hemiparesis following cerebral infarction affecting left non-dominant side: Secondary | ICD-10-CM | POA: Diagnosis not present

## 2017-06-20 DIAGNOSIS — J449 Chronic obstructive pulmonary disease, unspecified: Secondary | ICD-10-CM | POA: Diagnosis not present

## 2017-06-20 DIAGNOSIS — Z7902 Long term (current) use of antithrombotics/antiplatelets: Secondary | ICD-10-CM | POA: Diagnosis not present

## 2017-06-25 DIAGNOSIS — J449 Chronic obstructive pulmonary disease, unspecified: Secondary | ICD-10-CM | POA: Diagnosis not present

## 2017-06-25 DIAGNOSIS — Z8679 Personal history of other diseases of the circulatory system: Secondary | ICD-10-CM | POA: Diagnosis not present

## 2017-06-25 DIAGNOSIS — F17218 Nicotine dependence, cigarettes, with other nicotine-induced disorders: Secondary | ICD-10-CM | POA: Diagnosis not present

## 2017-06-25 DIAGNOSIS — I69354 Hemiplegia and hemiparesis following cerebral infarction affecting left non-dominant side: Secondary | ICD-10-CM | POA: Diagnosis not present

## 2017-06-25 DIAGNOSIS — I10 Essential (primary) hypertension: Secondary | ICD-10-CM | POA: Diagnosis not present

## 2017-06-25 DIAGNOSIS — Z7902 Long term (current) use of antithrombotics/antiplatelets: Secondary | ICD-10-CM | POA: Diagnosis not present

## 2017-07-03 DIAGNOSIS — Z8679 Personal history of other diseases of the circulatory system: Secondary | ICD-10-CM | POA: Diagnosis not present

## 2017-07-03 DIAGNOSIS — F17218 Nicotine dependence, cigarettes, with other nicotine-induced disorders: Secondary | ICD-10-CM | POA: Diagnosis not present

## 2017-07-03 DIAGNOSIS — Z7902 Long term (current) use of antithrombotics/antiplatelets: Secondary | ICD-10-CM | POA: Diagnosis not present

## 2017-07-03 DIAGNOSIS — I10 Essential (primary) hypertension: Secondary | ICD-10-CM | POA: Diagnosis not present

## 2017-07-03 DIAGNOSIS — J449 Chronic obstructive pulmonary disease, unspecified: Secondary | ICD-10-CM | POA: Diagnosis not present

## 2017-07-03 DIAGNOSIS — I69354 Hemiplegia and hemiparesis following cerebral infarction affecting left non-dominant side: Secondary | ICD-10-CM | POA: Diagnosis not present

## 2017-07-09 DIAGNOSIS — Z8679 Personal history of other diseases of the circulatory system: Secondary | ICD-10-CM | POA: Diagnosis not present

## 2017-07-09 DIAGNOSIS — F17218 Nicotine dependence, cigarettes, with other nicotine-induced disorders: Secondary | ICD-10-CM | POA: Diagnosis not present

## 2017-07-09 DIAGNOSIS — Z7902 Long term (current) use of antithrombotics/antiplatelets: Secondary | ICD-10-CM | POA: Diagnosis not present

## 2017-07-09 DIAGNOSIS — I10 Essential (primary) hypertension: Secondary | ICD-10-CM | POA: Diagnosis not present

## 2017-07-09 DIAGNOSIS — I69354 Hemiplegia and hemiparesis following cerebral infarction affecting left non-dominant side: Secondary | ICD-10-CM | POA: Diagnosis not present

## 2017-07-09 DIAGNOSIS — J449 Chronic obstructive pulmonary disease, unspecified: Secondary | ICD-10-CM | POA: Diagnosis not present

## 2017-07-17 DIAGNOSIS — Z7902 Long term (current) use of antithrombotics/antiplatelets: Secondary | ICD-10-CM | POA: Diagnosis not present

## 2017-07-17 DIAGNOSIS — F17218 Nicotine dependence, cigarettes, with other nicotine-induced disorders: Secondary | ICD-10-CM | POA: Diagnosis not present

## 2017-07-17 DIAGNOSIS — J449 Chronic obstructive pulmonary disease, unspecified: Secondary | ICD-10-CM | POA: Diagnosis not present

## 2017-07-17 DIAGNOSIS — Z8679 Personal history of other diseases of the circulatory system: Secondary | ICD-10-CM | POA: Diagnosis not present

## 2017-07-17 DIAGNOSIS — I10 Essential (primary) hypertension: Secondary | ICD-10-CM | POA: Diagnosis not present

## 2017-07-17 DIAGNOSIS — I69354 Hemiplegia and hemiparesis following cerebral infarction affecting left non-dominant side: Secondary | ICD-10-CM | POA: Diagnosis not present

## 2017-07-18 ENCOUNTER — Ambulatory Visit (INDEPENDENT_AMBULATORY_CARE_PROVIDER_SITE_OTHER): Payer: Medicare Other | Admitting: Neurology

## 2017-07-18 ENCOUNTER — Encounter: Payer: Self-pay | Admitting: Neurology

## 2017-07-18 VITALS — BP 102/59 | HR 61 | Ht 67.0 in

## 2017-07-18 DIAGNOSIS — E785 Hyperlipidemia, unspecified: Secondary | ICD-10-CM | POA: Diagnosis not present

## 2017-07-18 DIAGNOSIS — F17200 Nicotine dependence, unspecified, uncomplicated: Secondary | ICD-10-CM | POA: Diagnosis not present

## 2017-07-18 DIAGNOSIS — I10 Essential (primary) hypertension: Secondary | ICD-10-CM

## 2017-07-18 DIAGNOSIS — M5412 Radiculopathy, cervical region: Secondary | ICD-10-CM

## 2017-07-18 DIAGNOSIS — Z8679 Personal history of other diseases of the circulatory system: Secondary | ICD-10-CM

## 2017-07-18 DIAGNOSIS — I63132 Cerebral infarction due to embolism of left carotid artery: Secondary | ICD-10-CM

## 2017-07-18 NOTE — Patient Instructions (Signed)
-   continue ASA for stroke prevention - continue follow up with Dr. Estanislado Pandy  - check BP at home and record - Follow up with your primary care physician for stroke risk factor modification. Recommend maintain blood pressure goal <130/80, diabetes with hemoglobin A1c goal below 6.5% and lipids with LDL cholesterol goal below 70 mg/dL.  - quit smoking - may consider increase gabapentin for left shoulder and neck pain if needed. - follow up in 6 months

## 2017-07-18 NOTE — Progress Notes (Signed)
STROKE NEUROLOGY FOLLOW UP NOTE  NAME: Diane Hutchinson DOB: 19-Apr-1946  REASON FOR VISIT: stroke follow up HISTORY FROM: husband and chart  Today we had the pleasure of seeing Diane Hutchinson in follow-up at our Neurology [facility]. Pt was accompanied by husband.   History Summary Diane Hutchinson is a 71 y.o. 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 Jaynee Eagles on 11/24/14 for clearance for right breast elective surgery. As per chart, she was admitted to [facility] on 11/08/2009 with severe headache, nausea and vomiting and CT scan showed SAH. Cerebral angiogram revealed a right PICA aneurysm, left MCA aneurysm and right MCA aneurysm s/p clipping in 1993. The right PICA aneurysm was coiled by Dr. Patrecia Pour. However, the left MCA aneurysm still left without treated. Due to higher risk of rupture during general anesthesia, Dr. Jaynee Eagles performed CTA head and referred pt to Dr. Estanislado Pandy for management of left MCA aneurysm.  On 01/22/15, pt had first step of stent assisted left widenecked MCA aneurysm procedure. After the procedure, pt developed left UE and LE numbness, tingling, weakness and ataxia. Pt not a tPA candidate secondary to being on IV heparin drip and elevated APTT. She can not have MRI. Repeat CT confirmed right thalamus/posterior IC, considering related to procedure. She was continued on ASA 325 and plavix 75 as well as lipitor 40. She was also recommended to quit smoking. She was sent to CIR after discharge.  She has hx of HLD and on lipitor and lopid since 1993. Hx of HTN and taking metoprolol.  Follow up 03/04/15 - the patient has been doing relatively stable. She finished CIR and discharged home with home therapy 3 times a week. She has quit smoking since March admission. She still has left sided weakness but  slow improving. She will have the 2nd stage of left MCA aneurysm treatment in about 2 months with Dr. Estanislado Pandy. After that, she will decide on the elective right breast procedure (not cancer as per pt). Her BP 149/71 but she said at home the therapist checked on her, it was always at 120s. Her platelet function assay was quite low on last check, therefore her plavix was decreased to half tablets.  Follow up 06/08/15 - the patient has been doing well. Left-sided weakness much more improved than last visit. She came in with wheelchair, however at home she was able to work with therapist to walk with walker. She has been following up with Dr. Letta Pate for rehabilitation. Blood pressure much improved, today in clinic 113/61. She still on aspirin 325 mg and half tablet of Plavix.  Follow up 12/09/15 - pt initially quit smoking but now resumed smoking. Husband stated that she could be one day very good, walking well, but then the other day, she has left side flaccid, not able to walk and can drop to the ground. She sometimes complain of left knee pain. She does have anxiety and depression, following with psychiatrist and on Xanax, Risperdal, Zoloft and trazodone. She has appointment with Dr. Estanislado Pandy tomorrow. BP at home stable, 120-130. Today in clinic 111/57.  Follow-up 03/13/2016 - pt has been doing the same. Resumed smoking and not willing to quit at this time. Still has left UE weakness and finger difficulty. Able to walk with hermiwalker now. However, pt has complains of feeling "something in my  bowel". She felt intermittently "wide belt", "tight knots", "beads-like objects" in her gut, moving along from side to side. Husband stated that she has asked him to take them out from her but husband can not see anything. Pt has intermittent diarrhea and constipation, no abnormal stool or hemorrhoids or rectal prolapse. Husband did not see anything abnormal from rectum. They stated that this feeling aggravated pt  very much and they are frustrated. She had CT abd/pelvis and did not show findings to explain about her feeling. Her PCP considered may related to her stroke as per pt. She had colonoscopy many years ago and it was normal. She is on Xanax for anxiety.  Follow up 08/31/16 - patient has been doing the same. Continue to smok, and not willing to quit at this time. Walk with semi-walker outside but without assistance at home. Still complains of left abdomen abnormal feelings, feels like of wires coming out from left side abdomen and she has to pull it out and cut it. It happens everyday and especially at night. PCP and GI do not feel colonoscopy is necessary. She is on Neurontin 300 mg tid. She is also on dural antiplatelet and Lipitor. BP on the low side 99/66. Has not follow with Dr. Estanislado Pandy since 12/2015.   Follow up 01/01/17 - patient has been doing the same. Continues to smoke and not willing to quit at this time. Continued to complain of left abdomen abnormal feelings. Has been on Neurontin, however not effective even with changing schedule. She had appointment with GI for colonoscopy next month. Had fall and injured right knee, currently not able to walk or weight bearing.   Interval History During the interval time, pt strange abdominal feelings were resolved. She stated that she stopped taking her medication at the same time and instead they took them with different time interval and the strange feeling is gone. However, she complains of left neck pain, left shoulder pain, and radiating to left arm intermittently. She was using ice pad at left neck during visit. Although gabapentin was not listed in the med list, but husband positive that pt is taking at home just not sure about the dose. BP today 102/59. She still smoking and no plan to quit.   REVIEW OF SYSTEMS: Full 14 system review of systems performed and notable only for those listed below and in HPI above, all others are negative:    Constitutional:   Cardiovascular:  Ear/Nose/Throat: runny nose Skin:  Eyes:   Respiratory:   Gastroitestinal:   Genitourinary:  Hematology/Lymphatic:   Endocrine:  Musculoskeletal:   Allergy/Immunology:   Neurological:   Psychiatric:  Sleep:   The following represents the patient's updated allergies and side effects list: Allergies  Allergen Reactions  . Codeine Other (See Comments)    dellusions  . Meperidine Hcl Other (See Comments)    Hurts stomach  . Morphine Other (See Comments)    dellusions  . Sulfonamide Derivatives Other (See Comments)    Drives her nuts    The neurologically relevant items on the patient's problem list were reviewed on today's visit.  Neurologic Examination  A problem focused neurological exam (12 or more points of the single system neurologic examination, vital signs counts as 1 point, cranial nerves count for 8 points) was performed.  Blood pressure (!) 102/59, pulse 61, height 5\' 7"  (1.702 m).  General - Well nourished, well developed, in no apparent distress.  Ophthalmologic - fundi not visualized due to eye movement.  Cardiovascular -  Regular rate and rhythm with no murmur.  Neck - left neck pain on palpitation, radiating to left shoulder   Mental Status -  Level of arousal and orientation to time, place, and person were intact. Language including expression, naming, repetition, comprehension was assessed and found intact. Fund of Knowledge was assessed and was intact.  Cranial Nerves II - XII - II - Visual field intact OU. III, IV, VI - Extraocular movements intact. V - Facial sensation symmetrical bilaterally VII - Facial movement intact bilaterally. VIII - Hearing & vestibular intact bilaterally. X - Palate elevates symmetrically. XI - Chin turning & shoulder shrug intact bilaterally. XII - Tongue protrusion intact.  Motor Strength - The patient's strength was 4+/5 LUE proximal and distal with left hand drift and finger  dexterity difficulty, 5-/5 LLE proximal and distal. RLE not able to weight bearing due to right knee pain post fall. Bulk was normal and fasciculations were absent.   Motor Tone - Muscle tone was assessed at the neck and appendages and was normal.  Reflexes - The patient's reflexes were normal in all extremities and she had no pathological reflexes.  Sensory - Light touch, temperature/pinprick were assessed and were subjectively decreased on the left UE and LE.    Coordination - No ataxia but slow on test of left FTN.  Tremor was absent.  Gait and Station - not tested, in wheelchair   Data reviewed: I personally reviewed the images and agree with the radiology interpretations.  CTA head 12/03/14 - 1. Prior right MCA bifurcation aneurysm clipping and prior right PICA aneurysm coiling without evidence of residual/recurrent aneurysm. 2. Unchanged 4.5 mm left MCA bifurcation aneurysm. 3. No acute intracranial abnormality.  CTA head and neck 01/20/15 -  Pipeline stent placed in the left MCA spanning a left MCA aneurysm. No complications seen relative to that. No missing vessels. No hemorrhage. One could question mild spasm of the MCA branch just beyond the end of the stent, but the vessel is widely patent beyond that and appears as it did before. Previously clipped right MCA region aneurysm. No missing vessels demonstrated on the right compared to the study of 12/03/2014. 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  CT head 01/22/15 -  Changes consistent with prior aneurysm coiling, clipping and stenting as described. New rounded area of decreased attenuation in the right thalamus laterally consistent with an evolving area of ischemia.  2D echo - Normal LV size and systolic function, EF 61-95%. Normal RV size and systolic function. No significant valvular abnormalities. Mild LAE.  CT abd/pelvis 12/03/15 -  1. No acute abnormality seen to  explain the patient's symptoms. No evidence of traumatic injury to the abdomen or pelvis.  2. Chronic compression deformity involving the superior endplate of th L2 appears mildly worsened from 2016. The L1 vertebral body is unremarkable in appearance; previously suspected compression deformity at L1 simply reflects artifact form positioning.  3. Moderate left-sided renal atrophy noted. 4. Scattered calcification along the abdominal aorta and its branches, with likely moderate to severe luminal narrowing along the proximal right common iliac artery 5. Scattered diverticulosis along with sigmoid colon, without evidence of diverticulitis.  CTA head and neck 10/23/2016 CT HEAD: No acute intracranial process. Old RIGHT thalamus lacunar infarct. Old RIGHT temporal lobe/ MCA territory infarct.  CTA NECK: Atherosclerosis resulting in tandem moderate stenosis LEFT Common carotid artery origin. No acute vascular process or disease progression.  CTA HEAD: Status post LEFT MCA pipeline stent with  stable 3 x 4 mm LEFT middle cerebral artery wide neck aneurysm. Mild suspected stenosis LEFT M2 at distal stent. Status post coil embolization complete occlusion of RIGHT posterior-inferior cerebellar artery aneurysm. Status post aneurysm clipping of RIGHT MCA bifurcation aneurysm without re- cannulization.  Component     Latest Ref Rng 01/21/2015 01/22/2015  Cholesterol     0 - 200 mg/dL  116  Triglycerides     <150 mg/dL  146  HDL Cholesterol     >39 mg/dL  34 (L)  Total CHOL/HDL Ratio       3.4  VLDL     0 - 40 mg/dL  29  LDL (calc)     0 - 99 mg/dL  53  Hemoglobin A1C     4.8 - 5.6 % 6.0 (H)   Mean Plasma Glucose      126     Assessment: As you may recall, she is a 71 y.o. Caucasian female with PMH of HTN, HLD, right MCA aneurysm s/p clippering in 1993, [place] with right PICA ruptured aneurysm s/p coiling on 11/08/2009 was admitted for left MCA aneurysm management with pipe-line stent on 01/22/15,  found to have right thalamic/posterior IC stroke post procedure. Pt stroke risk factor including smoker, hx of HTN and HLD, but LDL controlled well on meds. Her stroke still most likely due to procedure, especially posterior IC stroke may due to embolic source from AchA coming out directly from ICA. She was continued on ASA 325mg  and with half tab plavix (due to low P2Y12) as well as lipitor. Follow up with Dr. Estanislado Pandy for management of left MCA aneurysm. Over time, her left-sided weakness and numbness much improved, currently able to walk with semi-walker. She initially quit smoking but now resumedAnd not willing to quit. She started to have strange feeling at left abdomen, CT abd/pelvis and colonoscopy not revealing. Overtime, the strange feeling resolved after pt takes medication not at the same time. However, pt complains of left neck and shoulder pain, likely due to cervical radiculopathy.   Plan:   - continue ASA for stroke prevention - continue follow up with Dr. Estanislado Pandy  - check BP at home and record - Follow up with your primary care physician for stroke risk factor modification. Recommend maintain blood pressure goal <130/80, diabetes with hemoglobin A1c goal below 6.5% and lipids with LDL cholesterol goal below 70 mg/dL.  - quit smoking - may consider increase gabapentin for left shoulder and neck pain if needed. - cautious with neck movement. - follow up in 6 months  I spent more than 25 minutes of face to face time with the patient. Greater than 50% of time was spent in counseling and coordination of care.    No orders of the defined types were placed in this encounter.   No orders of the defined types were placed in this encounter.  Patient Instructions  - continue ASA for stroke prevention - continue follow up with Dr. Estanislado Pandy  - check BP at home and record - Follow up with your primary care physician for stroke risk factor modification. Recommend maintain blood pressure  goal <130/80, diabetes with hemoglobin A1c goal below 6.5% and lipids with LDL cholesterol goal below 70 mg/dL.  - quit smoking - may consider increase gabapentin for left shoulder and neck pain if needed. - follow up in 6 months    Rosalin Hawking, MD PhD [facility] [address], [place], [place] [address] [phone]

## 2017-07-19 DIAGNOSIS — Z7902 Long term (current) use of antithrombotics/antiplatelets: Secondary | ICD-10-CM | POA: Diagnosis not present

## 2017-07-19 DIAGNOSIS — I69354 Hemiplegia and hemiparesis following cerebral infarction affecting left non-dominant side: Secondary | ICD-10-CM | POA: Diagnosis not present

## 2017-07-19 DIAGNOSIS — F17218 Nicotine dependence, cigarettes, with other nicotine-induced disorders: Secondary | ICD-10-CM | POA: Diagnosis not present

## 2017-07-19 DIAGNOSIS — I63132 Cerebral infarction due to embolism of left carotid artery: Secondary | ICD-10-CM | POA: Diagnosis not present

## 2017-07-19 DIAGNOSIS — E784 Other hyperlipidemia: Secondary | ICD-10-CM | POA: Diagnosis not present

## 2017-07-19 DIAGNOSIS — Z8679 Personal history of other diseases of the circulatory system: Secondary | ICD-10-CM | POA: Diagnosis not present

## 2017-07-19 DIAGNOSIS — J449 Chronic obstructive pulmonary disease, unspecified: Secondary | ICD-10-CM | POA: Diagnosis not present

## 2017-07-19 DIAGNOSIS — I10 Essential (primary) hypertension: Secondary | ICD-10-CM | POA: Diagnosis not present

## 2017-07-26 DIAGNOSIS — F4312 Post-traumatic stress disorder, chronic: Secondary | ICD-10-CM | POA: Diagnosis not present

## 2017-07-26 DIAGNOSIS — F329 Major depressive disorder, single episode, unspecified: Secondary | ICD-10-CM | POA: Diagnosis not present

## 2017-08-06 ENCOUNTER — Telehealth: Payer: Self-pay | Admitting: Neurology

## 2017-08-06 NOTE — Telephone Encounter (Signed)
Patient's husband calling to discuss dosage for Gabapentin which was discussed at last office visit.

## 2017-08-06 NOTE — Telephone Encounter (Signed)
During the last visit, she complained of neck and back pain. I told her and her husband that if pain not in control, can consider increase the gabapentin dose. Please call pt or her husband, if she needs more gabapentin for her pain, she can take 2 tabs (600mg ) tid. If she is going to run out of her med, please refill it. Thanks.   Rosalin Hawking, MD PhD Stroke Neurology 08/06/2017 9:55 PM

## 2017-08-06 NOTE — Telephone Encounter (Signed)
Rn call patients husband on dpr. PTs husband at last visit it was discuss for pt to double up on her gabapentin. PTs husband stated she is taking one pill three times a day.THey were told by Dr. Erlinda Hong to double up and take 2 pills three times a day. Rn stated a message will be sent to Dr. Erlinda Hong. PTs husband stated she has enough pills but will run out this week probably.

## 2017-08-07 ENCOUNTER — Other Ambulatory Visit: Payer: Self-pay

## 2017-08-07 MED ORDER — GABAPENTIN 300 MG PO CAPS: ORAL_CAPSULE | ORAL | 11 refills | Status: AC

## 2017-08-07 MED ORDER — GABAPENTIN 300 MG PO CAPS: ORAL_CAPSULE | ORAL | 11 refills | Status: DC

## 2017-08-07 NOTE — Telephone Encounter (Signed)
Refill done for pt per Dr. Erlinda Hong note. Sent to pharmacy.

## 2017-08-29 DIAGNOSIS — M1711 Unilateral primary osteoarthritis, right knee: Secondary | ICD-10-CM | POA: Diagnosis not present

## 2017-08-29 DIAGNOSIS — M25572 Pain in left ankle and joints of left foot: Secondary | ICD-10-CM | POA: Diagnosis not present

## 2017-08-29 DIAGNOSIS — M25561 Pain in right knee: Secondary | ICD-10-CM | POA: Diagnosis not present

## 2017-09-13 ENCOUNTER — Emergency Department (HOSPITAL_COMMUNITY): Payer: Medicare Other

## 2017-09-13 ENCOUNTER — Encounter (HOSPITAL_COMMUNITY): Payer: Self-pay | Admitting: Emergency Medicine

## 2017-09-13 ENCOUNTER — Telehealth: Payer: Self-pay | Admitting: Student

## 2017-09-13 ENCOUNTER — Telehealth (HOSPITAL_COMMUNITY): Payer: Self-pay | Admitting: Radiology

## 2017-09-13 ENCOUNTER — Emergency Department (HOSPITAL_COMMUNITY)
Admission: EM | Admit: 2017-09-13 | Discharge: 2017-09-13 | Disposition: A | Discharge status: Home / Self Care | Payer: Medicare Other | Attending: Emergency Medicine | Admitting: Emergency Medicine

## 2017-09-13 DIAGNOSIS — Z7982 Long term (current) use of aspirin: Secondary | ICD-10-CM | POA: Diagnosis not present

## 2017-09-13 DIAGNOSIS — R410 Disorientation, unspecified: Secondary | ICD-10-CM | POA: Insufficient documentation

## 2017-09-13 DIAGNOSIS — R112 Nausea with vomiting, unspecified: Secondary | ICD-10-CM | POA: Diagnosis not present

## 2017-09-13 DIAGNOSIS — R197 Diarrhea, unspecified: Secondary | ICD-10-CM | POA: Diagnosis not present

## 2017-09-13 DIAGNOSIS — I639 Cerebral infarction, unspecified: Secondary | ICD-10-CM | POA: Diagnosis not present

## 2017-09-13 DIAGNOSIS — Z8673 Personal history of transient ischemic attack (TIA), and cerebral infarction without residual deficits: Secondary | ICD-10-CM | POA: Insufficient documentation

## 2017-09-13 DIAGNOSIS — J449 Chronic obstructive pulmonary disease, unspecified: Secondary | ICD-10-CM | POA: Diagnosis not present

## 2017-09-13 DIAGNOSIS — J189 Pneumonia, unspecified organism: Secondary | ICD-10-CM | POA: Diagnosis not present

## 2017-09-13 DIAGNOSIS — Z79899 Other long term (current) drug therapy: Secondary | ICD-10-CM | POA: Insufficient documentation

## 2017-09-13 DIAGNOSIS — K5730 Diverticulosis of large intestine without perforation or abscess without bleeding: Secondary | ICD-10-CM | POA: Diagnosis not present

## 2017-09-13 DIAGNOSIS — I10 Essential (primary) hypertension: Secondary | ICD-10-CM | POA: Insufficient documentation

## 2017-09-13 DIAGNOSIS — R1084 Generalized abdominal pain: Secondary | ICD-10-CM | POA: Diagnosis not present

## 2017-09-13 DIAGNOSIS — F17210 Nicotine dependence, cigarettes, uncomplicated: Secondary | ICD-10-CM | POA: Insufficient documentation

## 2017-09-13 DIAGNOSIS — R531 Weakness: Secondary | ICD-10-CM | POA: Diagnosis not present

## 2017-09-13 DIAGNOSIS — R404 Transient alteration of awareness: Secondary | ICD-10-CM | POA: Diagnosis not present

## 2017-09-13 LAB — I-STAT CHEM 8, ED
BUN: 22 mg/dL — ABNORMAL HIGH (ref 6–20)
CALCIUM ION: 0.92 mmol/L — AB (ref 1.15–1.40)
CHLORIDE: 102 mmol/L (ref 101–111)
Creatinine, Ser: 0.8 mg/dL (ref 0.44–1.00)
Glucose, Bld: 107 mg/dL — ABNORMAL HIGH (ref 65–99)
HCT: 34 % — ABNORMAL LOW (ref 36.0–46.0)
Hemoglobin: 11.6 g/dL — ABNORMAL LOW (ref 12.0–15.0)
Potassium: 4.1 mmol/L (ref 3.5–5.1)
SODIUM: 133 mmol/L — AB (ref 135–145)
TCO2: 27 mmol/L (ref 22–32)

## 2017-09-13 LAB — COMPREHENSIVE METABOLIC PANEL
ALT: 8 U/L — ABNORMAL LOW (ref 14–54)
ANION GAP: 9 (ref 5–15)
AST: 11 U/L — ABNORMAL LOW (ref 15–41)
Albumin: 2.2 g/dL — ABNORMAL LOW (ref 3.5–5.0)
Alkaline Phosphatase: 96 U/L (ref 38–126)
BUN: 16 mg/dL (ref 6–20)
CALCIUM: 7.6 mg/dL — AB (ref 8.9–10.3)
CO2: 23 mmol/L (ref 22–32)
Chloride: 101 mmol/L (ref 101–111)
Creatinine, Ser: 0.99 mg/dL (ref 0.44–1.00)
GFR calc non Af Amer: 56 mL/min — ABNORMAL LOW (ref >60)
Glucose, Bld: 112 mg/dL — ABNORMAL HIGH (ref 65–99)
POTASSIUM: 3.5 mmol/L (ref 3.5–5.1)
SODIUM: 133 mmol/L — AB (ref 135–145)
TOTAL PROTEIN: 5.3 g/dL — AB (ref 6.5–8.1)
Total Bilirubin: 0.5 mg/dL (ref 0.3–1.2)

## 2017-09-13 LAB — URINALYSIS, ROUTINE W REFLEX MICROSCOPIC
Bilirubin Urine: NEGATIVE
GLUCOSE, UA: NEGATIVE mg/dL
Ketones, ur: NEGATIVE mg/dL
NITRITE: NEGATIVE
PH: 5 (ref 5.0–8.0)
Protein, ur: NEGATIVE mg/dL
Specific Gravity, Urine: 1.008 (ref 1.005–1.030)

## 2017-09-13 LAB — CBC WITH DIFFERENTIAL/PLATELET
Basophils Absolute: 0 10*3/uL (ref 0.0–0.1)
Basophils Relative: 0 %
EOS ABS: 0.1 10*3/uL (ref 0.0–0.7)
EOS PCT: 0 %
HCT: 34 % — ABNORMAL LOW (ref 36.0–46.0)
HEMOGLOBIN: 11.9 g/dL — AB (ref 12.0–15.0)
LYMPHS ABS: 1.3 10*3/uL (ref 0.7–4.0)
Lymphocytes Relative: 7 %
MCH: 31.6 pg (ref 26.0–34.0)
MCHC: 35 g/dL (ref 30.0–36.0)
MCV: 90.4 fL (ref 78.0–100.0)
MONO ABS: 0.5 10*3/uL (ref 0.1–1.0)
MONOS PCT: 3 %
NEUTROS PCT: 90 %
Neutro Abs: 16.5 10*3/uL — ABNORMAL HIGH (ref 1.7–7.7)
Platelets: 390 10*3/uL (ref 150–400)
RBC: 3.76 MIL/uL — ABNORMAL LOW (ref 3.87–5.11)
RDW: 13.3 % (ref 11.5–15.5)
WBC: 18.3 10*3/uL — ABNORMAL HIGH (ref 4.0–10.5)

## 2017-09-13 LAB — I-STAT CG4 LACTIC ACID, ED
Lactic Acid, Venous: 0.58 mmol/L (ref 0.5–1.9)
Lactic Acid, Venous: 0.53 mmol/L (ref 0.5–1.9)

## 2017-09-13 LAB — MAGNESIUM: MAGNESIUM: 1.5 mg/dL — AB (ref 1.7–2.4)

## 2017-09-13 LAB — LIPASE, BLOOD: Lipase: 53 U/L — ABNORMAL HIGH (ref 11–51)

## 2017-09-13 MED ORDER — DOXYCYCLINE HYCLATE 100 MG PO TABS: 100.0000 mg | ORAL_TABLET | Freq: Two times a day (BID) | ORAL | 0 refills | Status: AC

## 2017-09-13 MED ORDER — IOPAMIDOL (ISOVUE-370) INJECTION 76%
INTRAVENOUS | Status: AC
  Administered 2017-09-13: 50 mL
  Filled 2017-09-13: qty 50

## 2017-09-13 MED ORDER — DOXYCYCLINE HYCLATE 100 MG PO TABS
100.0000 mg | ORAL_TABLET | Freq: Once | ORAL | Status: AC
  Administered 2017-09-13: 100 mg via ORAL
  Filled 2017-09-13: qty 1

## 2017-09-13 MED ORDER — MAGNESIUM OXIDE 400 (241.3 MG) MG PO TABS
800.0000 mg | ORAL_TABLET | Freq: Once | ORAL | Status: AC
  Administered 2017-09-13: 800 mg via ORAL
  Filled 2017-09-13 (×2): qty 2

## 2017-09-13 MED ORDER — SODIUM CHLORIDE 0.9 % IV BOLUS (SEPSIS)
1000.0000 mL | Freq: Once | INTRAVENOUS | Status: AC
  Administered 2017-09-13: 1000 mL via INTRAVENOUS

## 2017-09-13 NOTE — ED Provider Notes (Signed)
[facility] DEPARTMENT Provider Note   CSN: [id number] Arrival date & time: 09/13/17  1731     History   Chief Complaint Chief Complaint  Patient presents with  . Weakness    HPI Diane Hutchinson is a 71 y.o. female.  Patient with hx of Intracranial Aneurysm, CVA with residual the sided weakness, Migraine, HTN, COPD, GERD who presents with 1-2 weeks of waxing and waning confusion. The patients husband states that she has at times confused him for her brother or father and has been confused as to where she was when she was in their home. She has additionally been experiencing nausea and vomiting over the past 1-2 weeks as well. She endorses a severe headache about a week ago that she states feels similar to her previous aneurysm, which has since resolved. Her husband states that he felt her confusion was worse with an arthritis medicine she had started recently (he was not sure what this was) but he has since stopped giving her this medication. Patient endorses a sore throat this morning in the setting of recent nausea and vomiting. She does states that she has been feeling better today after receiving IV fluids from EMS.       Past Medical History:  Diagnosis Date  . Anxiety    h/o of panic attack  . Arthritis    knees  . COPD (chronic obstructive pulmonary disease) ([place])   . Depression   . GERD (gastroesophageal reflux disease)    occas. use of TUMS  . Hypertension   . Migraine   . Stroke ([place]) 2016   weakness on left side.    Patient Active Problem List   Diagnosis Date Noted  . Knee pain, right 01/03/2017  . Neuropathy 08/31/2016  . Lower abdominal pain 03/13/2016  . Anxiety state 12/09/2015  . Cerebral infarction due to embolism of left carotid artery ([place]) 03/04/2015  . Aneurysm, cerebral, nonruptured 03/04/2015  . History of ruptured arterial aneurysm 03/04/2015  . Essential hypertension 03/04/2015  . Hyperlipidemia 03/04/2015    . Alterations of sensations following CVA (cerebrovascular accident)   . Embolic cerebral infarction ([place]) 01/22/2015  . Left hemiparesis ([place]) 01/22/2015  . Stroke ([place])   . Cerebral embolism with cerebral infarction ([place]) 01/21/2015  . Ataxia   . Brain aneurysm 01/20/2015  . TOBACCO ABUSE 01/04/2010  . BRONCHITIS, CHRONIC 01/04/2010  . CERVICAL CANCER 01/03/2010  . HYPERLIPIDEMIA 01/03/2010  . DEPRESSION 01/03/2010  . SUBARACHNOID HEMORRHAGE 01/03/2010  . C O P D 01/03/2010    Past Surgical History:  Procedure Laterality Date  . APPENDECTOMY     age 37  . [place] surgery  2010  . BRAIN SURGERY  1993   aneurysm  . CATARACT EXTRACTION W/PHACO Right 08/10/2015   Procedure: CATARACT EXTRACTION PHACO AND INTRAOCULAR LENS PLACEMENT (IOC);  Surgeon: Rutherford Guys, MD;  Location: [facility];  Service: Ophthalmology;  Laterality: Right;  CDE:8.19  . CATARACT EXTRACTION W/PHACO Left 08/24/2015   Procedure: CATARACT EXTRACTION PHACO AND INTRAOCULAR LENS PLACEMENT (IOC);  Surgeon: Rutherford Guys, MD;  Location: [facility];  Service: Ophthalmology;  Laterality: Left;  CDE:6.31  . CEREBRAL ANEURYSM REPAIR     x3; last one 01-2015.  . cervical surgry    . CHOLECYSTECTOMY     age 14  . EYE SURGERY  years ago   laser to both eyes for blocked tear ducts   . HEMORROIDECTOMY  years ago  . IR GENERIC HISTORICAL  09/22/2016  IR RADIOLOGIST EVAL & MGMT 09/22/2016 [facility]  . RADIOLOGY WITH ANESTHESIA N/A 01/20/2015   Procedure: RADIOLOGY WITH ANESTHESIA;  Surgeon: Luanne Bras, MD;  Location: [facility];  Service: Radiology;  Laterality: N/A;  . SEPTOPLASTY    . TUBAL LIGATION  years ago    OB History    No data available       Home Medications    Prior to Admission medications   Medication Sig Start Date End Date Taking? Authorizing Provider  acetaminophen (TYLENOL) 500 MG tablet Take 500 mg by mouth every 4 (four) hours as needed for mild pain.    Yes [provider]  alendronate  (FOSAMAX) 70 MG tablet Take 70 mg by mouth every Monday. Take with a full glass of water on an empty stomach.   Yes [provider]  ALPRAZolam (XANAX) 1 MG tablet TAKE 1 AND 1/2 TABLETS BY MOUTH FOUR TIMES DAILY 12/13/16  Yes [provider]  aspirin EC 81 MG tablet Take 162 mg by mouth daily.   Yes [provider]  atorvastatin (LIPITOR) 40 MG tablet Take 40 mg by mouth every evening.    Yes [provider]  benazepril (LOTENSIN) 20 MG tablet Take 20 mg by mouth daily. 03/22/15  Yes [provider]  chlorthalidone (HYGROTON) 25 MG tablet Take 25 mg by mouth daily. 03/22/15  Yes [provider]  clopidogrel (PLAVIX) 75 MG tablet Take 75 mg by mouth daily.   Yes [provider]  gabapentin (NEURONTIN) 300 MG capsule Take 2 capsules three times a day by mouth Patient taking differently: Take 600 mg by mouth 3 (three) times daily.  08/07/17  Yes Rosalin Hawking, MD  gemfibrozil (LOPID) 600 MG tablet Take 600 mg by mouth 2 (two) times daily before a meal.   Yes [provider]  guaiFENesin (MUCINEX) 600 MG 12 hr tablet Take 1,200 mg by mouth daily.   Yes [provider]  meloxicam (MOBIC) 15 MG tablet Take 15 mg by mouth daily. 02/17/15  Yes [provider]  metoprolol succinate (TOPROL XL) 25 MG 24 hr tablet Take 25 mg by mouth at bedtime.    Yes [provider]  Omega-3 Fatty Acids (FISH OIL PO) Take 1 capsule by mouth 2 (two) times daily.    Yes [provider]  risperiDONE (RISPERDAL) 0.5 MG tablet Take 1 mg by mouth at bedtime.    Yes [provider]  sertraline (ZOLOFT) 100 MG tablet Take 100 mg by mouth 3 (three) times daily.  11/16/14  Yes [provider]  tiZANidine (ZANAFLEX) 2 MG tablet Take 2 mg by mouth 2 (two) times daily.   Yes [provider]  doxycycline (VIBRA-TABS) 100 MG tablet Take 1 tablet (100 mg total) by mouth 2 (two) times daily. 09/13/17 09/20/17  Neva Seat, MD    Family History Family History  Problem Relation Age of Onset  . Stroke Father     Social History Social History  Substance Use Topics  . Smoking status: Current Every Day Smoker    Packs/day: 1.00    Years: 55.00  . Smokeless tobacco: Never Used  . Alcohol use No     Allergies   Codeine; Meperidine hcl; Morphine; and Sulfonamide derivatives   Review of Systems Review of Systems  Constitutional: Positive for chills. Negative for fever.  HENT: Positive for sore throat.   Respiratory: Negative for shortness of breath.   Cardiovascular: Negative for chest pain and leg swelling.  Gastrointestinal: Positive for diarrhea. Negative for abdominal distention.  Genitourinary: Negative for difficulty urinating and dysuria.  Musculoskeletal: Positive for gait problem.  Neurological: Positive for headaches. Negative for speech difficulty and numbness.  Psychiatric/Behavioral: Positive for confusion.     Physical Exam Updated Vital Signs BP 110/68   Pulse 82   Temp 97.7 F (36.5 C) (Oral)   Resp 18   Ht 5\' 7"  (1.702 m)   Wt 63.5 kg (140 lb)   SpO2 96%   BMI 21.93 kg/m   Physical Exam  Constitutional: She is oriented to person, place, and time. She appears well-developed and well-nourished. No distress.  HENT:  Head: Normocephalic and atraumatic.  Eyes: EOM are normal. Right eye exhibits no discharge. Left eye exhibits no discharge.  Cardiovascular: Normal rate, regular rhythm and normal heart sounds.   Pulmonary/Chest: Effort normal and breath sounds normal. No respiratory distress.  Abdominal: Soft. Bowel sounds are normal.  RLQ & LLQ Tender to palpation  Musculoskeletal: She exhibits no edema or deformity.  Neurological: She is alert and oriented to person, place, and time. No cranial nerve deficit.  LUE 4/5 strength LLE 3-4/5 Proximal strength L grip strength reduced Residual weakness from previous stroke   Skin: Skin is dry. No rash noted.    Psychiatric: She has a normal mood and affect.     ED Treatments / Results  Labs (all labs ordered are listed, but only abnormal results are displayed) Labs Reviewed  CBC WITH DIFFERENTIAL/PLATELET - Abnormal; Notable for the following:       Result Value   WBC 18.3 (*)    RBC 3.76 (*)    Hemoglobin 11.9 (*)    HCT 34.0 (*)    Neutro Abs 16.5 (*)    All other components within normal limits  COMPREHENSIVE METABOLIC PANEL - Abnormal; Notable for the following:    Sodium 133 (*)    Glucose, Bld 112 (*)    Calcium 7.6 (*)    Total Protein 5.3 (*)    Albumin 2.2 (*)    AST 11 (*)    ALT 8 (*)    GFR calc non Af Amer 56 (*)    All other components within normal limits  LIPASE, BLOOD - Abnormal; Notable for the following:    Lipase 53 (*)    All other components within normal limits  MAGNESIUM - Abnormal; Notable for the following:    Magnesium 1.5 (*)    All other components within normal limits  URINALYSIS, ROUTINE W REFLEX MICROSCOPIC - Abnormal; Notable for the following:    Hgb urine dipstick SMALL (*)    Leukocytes, UA SMALL (*)    Bacteria, UA RARE (*)    Squamous Epithelial / LPF 0-5 (*)    All other components within normal limits  I-STAT CHEM 8, ED - Abnormal; Notable for the following:    Sodium 133 (*)    BUN 22 (*)    Glucose, Bld 107 (*)    Calcium, Ion 0.92 (*)    Hemoglobin 11.6 (*)    HCT 34.0 (*)    All other components within normal limits  I-STAT CG4 LACTIC ACID, ED  I-STAT CG4 LACTIC ACID, ED    EKG  EKG Interpretation None       Radiology Ct Angio Head W Or Wo Contrast  Result Date: 09/13/2017 CLINICAL DATA:  71 y/o F; 3 days with headache on state yesterday. History of stroke and cerebral aneurysm repair. EXAM: CT ANGIOGRAPHY HEAD  AND NECK TECHNIQUE: Multidetector CT imaging of the head and neck was performed using the standard protocol during bolus administration of intravenous contrast. Multiplanar CT image reconstructions and MIPs were  obtained to evaluate the vascular anatomy. Carotid stenosis measurements (when applicable) are obtained utilizing NASCET criteria, using the distal internal carotid diameter as the denominator. CONTRAST:  50 cc Isovue 370 COMPARISON:  10/23/2016 CT angiogram of head and neck. FINDINGS: CT HEAD FINDINGS Brain: No evidence of acute infarction, hemorrhage, hydrocephalus, extra-axial collection or mass lesion/mass effect. Stable chronic microvascular ischemic changes and parenchymal volume loss. Stable right anterior temporal lobe encephalomalacia. Stable chronic lacunar infarct of right thalamus. Vascular: As below. Skull: Stable postsurgical changes related to right frontotemporal craniotomy. Sinuses: Imaged portions are clear. Orbits: No acute finding. Review of the MIP images confirms the above findings CTA NECK FINDINGS Aortic arch: Standard branching. Imaged portion shows no evidence of aneurysm or dissection. No significant stenosis of the major arch vessel origins. Stable extensive ulcerated fibrofatty plaque of the aortic arch. Right carotid system: No evidence of dissection, stenosis (50% or greater) or occlusion. Left carotid system: Multiple segments of mild less than 50% stenosis of the left common carotid artery secondary to fibrofatty plaque. Widely patent carotid bifurcation and internal carotid artery of the neck. Vertebral arteries: Right dominant. No evidence of dissection, stenosis (50% or greater) or occlusion. Skeleton: Grade 1 C3-4 anterolisthesis. Moderate cervical spondylosis with disc and facet arthropathy. No high-grade bony canal stenosis. Other neck: Negative. Upper chest: Negative. Review of the MIP images confirms the above findings CTA HEAD FINDINGS Anterior circulation: Stable left MCA/M2 inferior division pipeline stent with stable probable downstream stenosis. Right MCA aneurysm clipping and coiling without recurrence. Left MCA bifurcation laterally directed 3 x 4 cm wide neck aneurysm  is stable (series 5, image 55). Otherwise no large vessel occlusion, aneurysm, or significant stenosis is identified. Posterior circulation: Right vertebral artery/PICA aneurysm coil without evidence for recurrent aneurysm. Otherwise no large vessel occlusion, aneurysm, or significant stenosis is identified. Venous sinuses: As permitted by contrast timing, patent. Anatomic variants: No anterior posterior communicating artery identified, likely hypoplastic or absent. Delayed phase: No abnormal intracranial enhancement. Review of the MIP images confirms the above findings IMPRESSION: CT head: 1. No acute intracranial abnormality identified. 2. Stable right anterior temporal encephalomalacia and small chronic lacunar infarct in right thalamus. CTA neck: 1. Fibrofatty plaque of left common carotid artery with multiple segments of mild less than 50% stenosis. 2. Extensive ulcerated fibrofatty plaque of aortic arch. 3. No evidence of dissection, hemodynamically significant stenosis by NASCET criteria, or occlusion. CTA head: 1. Stable laterally directed left MCA bifurcation 4 mm wide neck aneurysm. 2. Stable left MCA/M2 inferior division pipeline stent with stable probable downstream stenosis. 3. Stable right MCA cistern aneurysm clipping/coiling without recurrence. 4. Stable right vertebral artery/PICA aneurysm coiling without recurrence. 5. No evidence for new aneurysm, large vessel occlusion, or high-grade stenosis. Electronically Signed   By: Kristine Garbe M.D.   On: 09/13/2017 22:07   Dg Chest 2 View  Result Date: 09/13/2017 CLINICAL DATA:  Weakness for 3 days. History of COPD and hypertension. Smoker. EXAM: CHEST  2 VIEW COMPARISON:  05/30/2017 and 09/17/2016 FINDINGS: There is mild prominence of interstitial markings, unchanged. There are no focal consolidations or pleural effusions. IMPRESSION: Mildly prominent interstitial markings. No focal acute pulmonary abnormality. Electronically Signed   By:  Nolon Nations M.D.   On: 09/13/2017 19:10   Ct Angio Neck W And/or Wo Contrast  Result  Date: 09/13/2017 CLINICAL DATA:  71 y/o F; 3 days with headache on state yesterday. History of stroke and cerebral aneurysm repair. EXAM: CT ANGIOGRAPHY HEAD AND NECK TECHNIQUE: Multidetector CT imaging of the head and neck was performed using the standard protocol during bolus administration of intravenous contrast. Multiplanar CT image reconstructions and MIPs were obtained to evaluate the vascular anatomy. Carotid stenosis measurements (when applicable) are obtained utilizing NASCET criteria, using the distal internal carotid diameter as the denominator. CONTRAST:  50 cc Isovue 370 COMPARISON:  10/23/2016 CT angiogram of head and neck. FINDINGS: CT HEAD FINDINGS Brain: No evidence of acute infarction, hemorrhage, hydrocephalus, extra-axial collection or mass lesion/mass effect. Stable chronic microvascular ischemic changes and parenchymal volume loss. Stable right anterior temporal lobe encephalomalacia. Stable chronic lacunar infarct of right thalamus. Vascular: As below. Skull: Stable postsurgical changes related to right frontotemporal craniotomy. Sinuses: Imaged portions are clear. Orbits: No acute finding. Review of the MIP images confirms the above findings CTA NECK FINDINGS Aortic arch: Standard branching. Imaged portion shows no evidence of aneurysm or dissection. No significant stenosis of the major arch vessel origins. Stable extensive ulcerated fibrofatty plaque of the aortic arch. Right carotid system: No evidence of dissection, stenosis (50% or greater) or occlusion. Left carotid system: Multiple segments of mild less than 50% stenosis of the left common carotid artery secondary to fibrofatty plaque. Widely patent carotid bifurcation and internal carotid artery of the neck. Vertebral arteries: Right dominant. No evidence of dissection, stenosis (50% or greater) or occlusion. Skeleton: Grade 1 C3-4  anterolisthesis. Moderate cervical spondylosis with disc and facet arthropathy. No high-grade bony canal stenosis. Other neck: Negative. Upper chest: Negative. Review of the MIP images confirms the above findings CTA HEAD FINDINGS Anterior circulation: Stable left MCA/M2 inferior division pipeline stent with stable probable downstream stenosis. Right MCA aneurysm clipping and coiling without recurrence. Left MCA bifurcation laterally directed 3 x 4 cm wide neck aneurysm is stable (series 5, image 55). Otherwise no large vessel occlusion, aneurysm, or significant stenosis is identified. Posterior circulation: Right vertebral artery/PICA aneurysm coil without evidence for recurrent aneurysm. Otherwise no large vessel occlusion, aneurysm, or significant stenosis is identified. Venous sinuses: As permitted by contrast timing, patent. Anatomic variants: No anterior posterior communicating artery identified, likely hypoplastic or absent. Delayed phase: No abnormal intracranial enhancement. Review of the MIP images confirms the above findings IMPRESSION: CT head: 1. No acute intracranial abnormality identified. 2. Stable right anterior temporal encephalomalacia and small chronic lacunar infarct in right thalamus. CTA neck: 1. Fibrofatty plaque of left common carotid artery with multiple segments of mild less than 50% stenosis. 2. Extensive ulcerated fibrofatty plaque of aortic arch. 3. No evidence of dissection, hemodynamically significant stenosis by NASCET criteria, or occlusion. CTA head: 1. Stable laterally directed left MCA bifurcation 4 mm wide neck aneurysm. 2. Stable left MCA/M2 inferior division pipeline stent with stable probable downstream stenosis. 3. Stable right MCA cistern aneurysm clipping/coiling without recurrence. 4. Stable right vertebral artery/PICA aneurysm coiling without recurrence. 5. No evidence for new aneurysm, large vessel occlusion, or high-grade stenosis. Electronically Signed   By: Kristine Garbe M.D.   On: 09/13/2017 22:07   Ct Renal Stone Study  Result Date: 09/13/2017 CLINICAL DATA:  71 year old female with abdominal pain. Concern for kidney stone. EXAM: CT ABDOMEN AND PELVIS WITHOUT CONTRAST TECHNIQUE: Multidetector CT imaging of the abdomen and pelvis was performed following the standard protocol without IV contrast. COMPARISON:  Abdominal CT dated 05/09/2017 FINDINGS: Evaluation of this exam is limited  in the absence of intravenous contrast. Lower chest: Confluent ground-glass opacities in the visualized lung bases most concerning for pneumonia. There has been interval progression of pulmonary opacities compared to the prior CT. Clinical correlation is recommended. No intra-abdominal free air or free fluid. Hepatobiliary: The liver is unremarkable. The gallbladder is not visualized, likely surgically absent. Pancreas: Unremarkable. No pancreatic ductal dilatation or surrounding inflammatory changes. Spleen: Normal in size without focal abnormality. Adrenals/Urinary Tract: Bilateral adrenal adenomas measuring up to 17 mm on the right. There is moderate left renal atrophy. The right kidney is unremarkable. Subcentimeter right renal inferior pole hypodense lesion is too small to characterize. The visualized ureters and urinary bladder appear unremarkable. Stomach/Bowel: There are scattered sigmoid diverticula without active inflammatory changes. Mild thickened appearance of the descending colon may be related to underdistention. Correlation with clinical exam is recommended to exclude colitis. There is no evidence of bowel obstruction. The appendix is not visualized with certainty. No inflammatory changes identified in the right lower quadrant. Vascular/Lymphatic: There is advanced aortoiliac atherosclerotic disease. There is a 2.6 cm ectasia of infrarenal abdominal aorta. Evaluation of the vasculature is limited on the dx noncontrast study. No portal venous gas identified. There is  no adenopathy. Reproductive: The uterus is anteverted and grossly unremarkable. The ovaries appear unremarkable as well. Other: None Musculoskeletal: There is osteopenia with degenerative changes of the spine. Multilevel posterior disc bulge most prominent at L1-L2 and L4-5. Chronic compression deformity at L2. No acute osseous pathology. IMPRESSION: 1. Confluent densities at the lung bases, progressed compared to the prior CT most consistent with pneumonia. Clinical correlation and follow-up recommended. 2. Mild thickened appearance of the descending colon, likely related to underdistention. Clinical correlation is recommended to exclude colitis. 3. Sigmoid diverticulosis.  No bowel obstruction. 4. Moderate left renal atrophy. No hydronephrosis or nephrolithiasis on either side. 5. Bilateral adrenal adenoma. 6. Advanced Aortic Atherosclerosis (ICD10-I70.0). 7. A 2.6 cm infrarenal abdominal aortic ectasia. Ectatic abdominal aorta at risk for aneurysm development. Recommend followup by ultrasound in 5 years. This recommendation follows ACR consensus guidelines: White Paper of the ACR Incidental Findings Committee II on Vascular Findings. J Am Coll Radiol 2013; 10:789-794. Electronically Signed   By: Anner Crete M.D.   On: 09/13/2017 21:48    Procedures Procedures (including critical care time)  Medications Ordered in ED Medications  sodium chloride 0.9 % bolus 1,000 mL (0 mLs Intravenous Stopped 09/13/17 2133)  iopamidol (ISOVUE-370) 76 % injection (50 mLs  Contrast Given 09/13/17 2053)  magnesium oxide (MAG-OX) tablet 800 mg (800 mg Oral Given 09/13/17 2148)  doxycycline (VIBRA-TABS) tablet 100 mg (100 mg Oral Given 09/13/17 2248)     Initial Impression / Assessment and Plan / ED Course  I have reviewed the triage vital signs and the nursing notes.  Pertinent labs & imaging results that were available during my care of the patient were reviewed by me and considered in my medical decision making  (see chart for details).  Clinical Course as of Sep 13 2252  Thu Sep 13, 2017  2204 Specific Gravity, Urine: 1.008 [AM]    Clinical Course User Index [AM] Neva Seat, MD   Patient presenting with waxing and waning mental status, history of multiple aneurysms, and hypotension. Concern for delirium vs cerebral bleed (considering her history). Will evaluate for causes of delirium and obtain CT angio head and neck to evaluate for bleed (per husband patient was schedule to undergo angiogram later this year).  Will give IVF for hypotension 2/2  N/V. - CBC: WBC 18K - CMP: Na 133, BUN 22 / Cr 0.8, Alb 2.2,  - Mg: 1.5 - Lactic Acid: 0.58 - Lipase: 53 - U/A: Small Hgb, Small Leukocytes, Rare bacteria, Negative for nitrites - CXR: No focal acute pulmonary abnormality - CT angiogram of Head and Neck: No acute abnormality, stable findings of previous aneurysms  Patient with leukocytosis and and abdominal pain tender to palpation of lower quadrants (though this tenderness is described as chronic). U/A nitrite negative with rare bacteria, CXR negative.  - CT Abd/Pelvis: Negative for significant intraabdominal findings, Bibasilar densities consistent with pneumonia.   Patients symptoms suspected to be due to delirium secondary to pneumonia seen on CT. Upon further questioning, the patient does endorse a worsening cough for the past 1-2 weeks.  - Patient has been discharge with a prescription for doxycycline and instructions to follow up. - She has also been instructed to stay hydrated.  Final Clinical Impressions(s) / ED Diagnoses   Final diagnoses:  Community acquired pneumonia, unspecified laterality    New Prescriptions New Prescriptions   DOXYCYCLINE (VIBRA-TABS) 100 MG TABLET    Take 1 tablet (100 mg total) by mouth 2 (two) times daily.     Neva Seat, MD 09/13/17 [place], [place], DO 09/13/17 2333

## 2017-09-13 NOTE — Telephone Encounter (Signed)
Patient known to [facility] service for previous intervention of intra-cranial aneursyms.  Husband called department today stating patient has had confusion and gait difficulties at home.  She has fallen within the past few days.   Spoke with Dr. Earleen Newport who recommends patient come to the ED for assessment.   PA spoke with husband who agrees and will bring patient to the ED today.   Brynda Greathouse, MS RD PA-C 3:02 PM

## 2017-09-13 NOTE — Discharge Instructions (Signed)
Thank you for allowing [place] to care for you.  Your symptoms are believed to have been cause by a pneumonia that was seen on imaging. - You have been prescribed an antibiotic (doxycycline) - Take Twice a day for 7 days  Your blood pressure was low today - Check your blood pressure at home and do not take your home blood pressure medication until your blood pressure returns to normal or just above normal.  Please schedule a follow up appointment with your primary care provider.  Call a medical professional if you experience a return of severe symptoms.

## 2017-09-13 NOTE — ED Notes (Signed)
Patient transported to CT 

## 2017-09-13 NOTE — Telephone Encounter (Signed)
Returned call to pt's husband. Mr. Boak states that his wife if having new onset sx x2-3 weeks. She is falling, very confused, does not feel well overall. Gave a note to [place], [place] to call pt after speaking with physician. JM

## 2017-09-13 NOTE — ED Triage Notes (Signed)
Per EMS: pt c/o weakness, headache and diarrhea x3 days.  Pt stated she called pcp and physician told pt top come straight to the hospital.  PTA: BP 98/58 after 1L of NS, HR 85, RR 16,SP02 95% on RA.

## 2017-09-18 DIAGNOSIS — F329 Major depressive disorder, single episode, unspecified: Secondary | ICD-10-CM | POA: Diagnosis not present

## 2017-09-18 DIAGNOSIS — F4312 Post-traumatic stress disorder, chronic: Secondary | ICD-10-CM | POA: Diagnosis not present

## 2017-09-18 DIAGNOSIS — Z23 Encounter for immunization: Secondary | ICD-10-CM | POA: Diagnosis not present

## 2017-09-20 DIAGNOSIS — R69 Illness, unspecified: Secondary | ICD-10-CM | POA: Diagnosis not present

## 2017-09-24 DIAGNOSIS — J158 Pneumonia due to other specified bacteria: Secondary | ICD-10-CM | POA: Diagnosis not present

## 2017-09-27 DIAGNOSIS — R69 Illness, unspecified: Secondary | ICD-10-CM | POA: Diagnosis not present

## 2017-10-11 DIAGNOSIS — S93412D Sprain of calcaneofibular ligament of left ankle, subsequent encounter: Secondary | ICD-10-CM | POA: Diagnosis not present

## 2017-10-29 ENCOUNTER — Telehealth (HOSPITAL_COMMUNITY): Payer: Self-pay

## 2017-10-29 NOTE — Telephone Encounter (Signed)
Called to have pt f/u in 1 yr with cta head/neck per Dr. Estanislado Pandy.  No answer, no vm. AW

## 2017-11-29 ENCOUNTER — Telehealth (HOSPITAL_COMMUNITY): Payer: Self-pay

## 2017-11-29 ENCOUNTER — Telehealth (HOSPITAL_COMMUNITY): Payer: Self-pay | Admitting: Radiology

## 2017-11-29 NOTE — Telephone Encounter (Signed)
Returned call, no answer, lvm. AW

## 2017-11-29 NOTE — Telephone Encounter (Signed)
Returned a call to patient's husband. Mr. Krigbaum stated that he wanted to call and let me know that Ms. Diane Hutchinson, our patient, had passed away on 12/20/17. I expressed deep sympathy to the husband and he was very appreciative. JM

## 2017-12-14 DEATH — deceased

## 2018-01-15 ENCOUNTER — Ambulatory Visit: Payer: Medicare Other | Admitting: Nurse Practitioner
